# Patient Record
Sex: Female | Born: 1950 | Race: Black or African American | Hispanic: No | Smoking: Former smoker
Health system: Southern US, Community
[De-identification: ages and names within clinical notes are randomized; demographics above are authoritative.]

## PROBLEM LIST (undated history)

## (undated) DIAGNOSIS — C801 Malignant (primary) neoplasm, unspecified: Secondary | ICD-10-CM

## (undated) DIAGNOSIS — Z9289 Personal history of other medical treatment: Secondary | ICD-10-CM

## (undated) DIAGNOSIS — IMO0001 Reserved for inherently not codable concepts without codable children: Secondary | ICD-10-CM

## (undated) DIAGNOSIS — IMO0002 Reserved for concepts with insufficient information to code with codable children: Secondary | ICD-10-CM

## (undated) DIAGNOSIS — R011 Cardiac murmur, unspecified: Secondary | ICD-10-CM

## (undated) DIAGNOSIS — D649 Anemia, unspecified: Secondary | ICD-10-CM

## (undated) HISTORY — DX: Reserved for inherently not codable concepts without codable children: IMO0001

## (undated) HISTORY — DX: Cardiac murmur, unspecified: R01.1

## (undated) HISTORY — DX: Reserved for concepts with insufficient information to code with codable children: IMO0002

---

## 1968-12-28 DIAGNOSIS — Z9289 Personal history of other medical treatment: Secondary | ICD-10-CM

## 1968-12-28 HISTORY — DX: Personal history of other medical treatment: Z92.89

## 1970-04-29 HISTORY — PX: BREAST SURGERY: SHX581

## 1999-06-02 ENCOUNTER — Emergency Department (HOSPITAL_COMMUNITY): Admission: EM | Admit: 1999-06-02 | Discharge: 1999-06-02 | Payer: Self-pay | Admitting: Emergency Medicine

## 1999-06-02 ENCOUNTER — Encounter: Payer: Self-pay | Admitting: Emergency Medicine

## 2000-08-14 ENCOUNTER — Other Ambulatory Visit: Admission: RE | Admit: 2000-08-14 | Discharge: 2000-08-14 | Payer: Self-pay | Admitting: Family Medicine

## 2000-12-05 ENCOUNTER — Inpatient Hospital Stay (HOSPITAL_COMMUNITY): Admission: EM | Admit: 2000-12-05 | Discharge: 2000-12-07 | Payer: Self-pay | Admitting: *Deleted

## 2000-12-06 ENCOUNTER — Encounter: Payer: Self-pay | Admitting: Cardiology

## 2000-12-07 ENCOUNTER — Encounter: Payer: Self-pay | Admitting: Cardiology

## 2000-12-17 ENCOUNTER — Encounter: Payer: Self-pay | Admitting: Cardiovascular Disease

## 2000-12-17 ENCOUNTER — Ambulatory Visit (HOSPITAL_COMMUNITY): Admission: RE | Admit: 2000-12-17 | Discharge: 2000-12-17 | Payer: Self-pay | Admitting: Cardiovascular Disease

## 2002-04-15 ENCOUNTER — Ambulatory Visit (HOSPITAL_COMMUNITY): Admission: RE | Admit: 2002-04-15 | Discharge: 2002-04-15 | Payer: Self-pay | Admitting: Neurosurgery

## 2002-04-15 ENCOUNTER — Encounter: Payer: Self-pay | Admitting: Neurosurgery

## 2003-04-14 ENCOUNTER — Encounter: Admission: RE | Admit: 2003-04-14 | Discharge: 2003-04-14 | Payer: Self-pay | Admitting: Cardiovascular Disease

## 2003-04-20 ENCOUNTER — Other Ambulatory Visit: Admission: RE | Admit: 2003-04-20 | Discharge: 2003-04-20 | Payer: Self-pay | Admitting: Obstetrics and Gynecology

## 2003-09-08 ENCOUNTER — Ambulatory Visit (HOSPITAL_COMMUNITY): Admission: RE | Admit: 2003-09-08 | Discharge: 2003-09-08 | Payer: Self-pay | Admitting: Cardiovascular Disease

## 2004-06-27 ENCOUNTER — Inpatient Hospital Stay (HOSPITAL_COMMUNITY): Admission: AD | Admit: 2004-06-27 | Discharge: 2004-06-28 | Payer: Self-pay | Admitting: Cardiovascular Disease

## 2004-11-13 ENCOUNTER — Other Ambulatory Visit: Admission: RE | Admit: 2004-11-13 | Discharge: 2004-11-13 | Payer: Self-pay | Admitting: Obstetrics and Gynecology

## 2004-12-03 ENCOUNTER — Encounter: Admission: RE | Admit: 2004-12-03 | Discharge: 2004-12-03 | Payer: Self-pay | Admitting: Obstetrics and Gynecology

## 2005-06-14 ENCOUNTER — Observation Stay (HOSPITAL_COMMUNITY): Admission: AD | Admit: 2005-06-14 | Discharge: 2005-06-16 | Payer: Self-pay | Admitting: Cardiovascular Disease

## 2005-07-05 ENCOUNTER — Encounter: Admission: RE | Admit: 2005-07-05 | Discharge: 2005-07-05 | Payer: Self-pay | Admitting: Cardiovascular Disease

## 2005-11-04 ENCOUNTER — Encounter: Admission: RE | Admit: 2005-11-04 | Discharge: 2005-11-04 | Payer: Self-pay | Admitting: Cardiovascular Disease

## 2006-02-17 ENCOUNTER — Encounter: Admission: RE | Admit: 2006-02-17 | Discharge: 2006-02-17 | Payer: Self-pay | Admitting: Obstetrics and Gynecology

## 2007-03-31 ENCOUNTER — Encounter: Admission: RE | Admit: 2007-03-31 | Discharge: 2007-03-31 | Payer: Self-pay | Admitting: Cardiovascular Disease

## 2007-09-30 ENCOUNTER — Emergency Department (HOSPITAL_COMMUNITY): Admission: EM | Admit: 2007-09-30 | Discharge: 2007-09-30 | Payer: Self-pay | Admitting: Emergency Medicine

## 2007-10-01 ENCOUNTER — Encounter: Admission: RE | Admit: 2007-10-01 | Discharge: 2007-10-01 | Payer: Self-pay | Admitting: Cardiovascular Disease

## 2008-05-06 ENCOUNTER — Encounter: Admission: RE | Admit: 2008-05-06 | Discharge: 2008-05-06 | Payer: Self-pay | Admitting: Cardiovascular Disease

## 2008-06-13 ENCOUNTER — Ambulatory Visit (HOSPITAL_COMMUNITY): Admission: RE | Admit: 2008-06-13 | Discharge: 2008-06-13 | Payer: Self-pay | Admitting: Neurosurgery

## 2008-12-18 ENCOUNTER — Observation Stay (HOSPITAL_COMMUNITY): Admission: EM | Admit: 2008-12-18 | Discharge: 2008-12-19 | Payer: Self-pay | Admitting: Emergency Medicine

## 2009-02-01 ENCOUNTER — Encounter: Admission: RE | Admit: 2009-02-01 | Discharge: 2009-02-01 | Payer: Self-pay | Admitting: Cardiovascular Disease

## 2009-03-30 ENCOUNTER — Emergency Department (HOSPITAL_COMMUNITY): Admission: EM | Admit: 2009-03-30 | Discharge: 2009-03-31 | Payer: Self-pay | Admitting: Emergency Medicine

## 2009-07-18 ENCOUNTER — Observation Stay (HOSPITAL_COMMUNITY): Admission: AD | Admit: 2009-07-18 | Discharge: 2009-07-21 | Payer: Self-pay | Admitting: Cardiovascular Disease

## 2009-07-19 ENCOUNTER — Encounter (INDEPENDENT_AMBULATORY_CARE_PROVIDER_SITE_OTHER): Payer: Self-pay | Admitting: Neurology

## 2009-07-19 ENCOUNTER — Encounter (INDEPENDENT_AMBULATORY_CARE_PROVIDER_SITE_OTHER): Payer: Self-pay | Admitting: Cardiovascular Disease

## 2009-07-20 ENCOUNTER — Ambulatory Visit: Payer: Self-pay | Admitting: Thoracic Surgery

## 2009-07-27 ENCOUNTER — Ambulatory Visit (HOSPITAL_COMMUNITY): Admission: RE | Admit: 2009-07-27 | Discharge: 2009-07-27 | Payer: Self-pay | Admitting: Thoracic Surgery

## 2009-07-28 ENCOUNTER — Ambulatory Visit: Payer: Self-pay | Admitting: Thoracic Surgery

## 2009-08-02 ENCOUNTER — Ambulatory Visit (HOSPITAL_COMMUNITY): Admission: RE | Admit: 2009-08-02 | Discharge: 2009-08-02 | Payer: Self-pay | Admitting: Thoracic Surgery

## 2009-08-03 ENCOUNTER — Ambulatory Visit: Payer: Self-pay | Admitting: Internal Medicine

## 2009-08-03 ENCOUNTER — Ambulatory Visit: Payer: Self-pay | Admitting: Thoracic Surgery

## 2009-08-07 LAB — CBC WITH DIFFERENTIAL/PLATELET
EOS%: 3.3 % (ref 0.0–7.0)
LYMPH%: 36.8 % (ref 14.0–49.7)
MCH: 30.1 pg (ref 25.1–34.0)
MCV: 88.5 fL (ref 79.5–101.0)
MONO%: 7.2 % (ref 0.0–14.0)
Platelets: 307 10*3/uL (ref 145–400)
RBC: 4.4 10*6/uL (ref 3.70–5.45)
RDW: 12.1 % (ref 11.2–14.5)

## 2009-08-07 LAB — COMPREHENSIVE METABOLIC PANEL
AST: 19 U/L (ref 0–37)
Albumin: 4.5 g/dL (ref 3.5–5.2)
Alkaline Phosphatase: 118 U/L — ABNORMAL HIGH (ref 39–117)
BUN: 13 mg/dL (ref 6–23)
Potassium: 3.9 mEq/L (ref 3.5–5.3)
Sodium: 142 mEq/L (ref 135–145)
Total Bilirubin: 0.2 mg/dL — ABNORMAL LOW (ref 0.3–1.2)

## 2009-08-17 LAB — CBC WITH DIFFERENTIAL/PLATELET
Basophils Absolute: 0 10*3/uL (ref 0.0–0.1)
EOS%: 1.8 % (ref 0.0–7.0)
Eosinophils Absolute: 0.1 10*3/uL (ref 0.0–0.5)
HGB: 13.2 g/dL (ref 11.6–15.9)
LYMPH%: 56 % — ABNORMAL HIGH (ref 14.0–49.7)
MCH: 30.2 pg (ref 25.1–34.0)
MCV: 88.6 fL (ref 79.5–101.0)
MONO%: 3.4 % (ref 0.0–14.0)
NEUT#: 1.5 10*3/uL (ref 1.5–6.5)
NEUT%: 38.4 % (ref 38.4–76.8)
Platelets: 290 10*3/uL (ref 145–400)
RDW: 12.1 % (ref 11.2–14.5)

## 2009-08-17 LAB — COMPREHENSIVE METABOLIC PANEL
AST: 16 U/L (ref 0–37)
Albumin: 4.1 g/dL (ref 3.5–5.2)
Alkaline Phosphatase: 95 U/L (ref 39–117)
BUN: 19 mg/dL (ref 6–23)
Creatinine, Ser: 1.12 mg/dL (ref 0.40–1.20)
Glucose, Bld: 83 mg/dL (ref 70–99)
Potassium: 4.3 mEq/L (ref 3.5–5.3)
Total Bilirubin: 0.4 mg/dL (ref 0.3–1.2)

## 2009-08-24 LAB — CBC WITH DIFFERENTIAL/PLATELET
Basophils Absolute: 0 10*3/uL (ref 0.0–0.1)
EOS%: 1.9 % (ref 0.0–7.0)
Eosinophils Absolute: 0.1 10*3/uL (ref 0.0–0.5)
HCT: 36.8 % (ref 34.8–46.6)
HGB: 12.7 g/dL (ref 11.6–15.9)
MCH: 30.7 pg (ref 25.1–34.0)
MCV: 88.7 fL (ref 79.5–101.0)
MONO%: 7.4 % (ref 0.0–14.0)
NEUT#: 2.6 10*3/uL (ref 1.5–6.5)
NEUT%: 46.4 % (ref 38.4–76.8)
Platelets: 227 10*3/uL (ref 145–400)
RDW: 12 % (ref 11.2–14.5)

## 2009-08-24 LAB — COMPREHENSIVE METABOLIC PANEL
Albumin: 3.8 g/dL (ref 3.5–5.2)
Alkaline Phosphatase: 111 U/L (ref 39–117)
BUN: 19 mg/dL (ref 6–23)
Calcium: 9.1 mg/dL (ref 8.4–10.5)
Creatinine, Ser: 1.04 mg/dL (ref 0.40–1.20)
Glucose, Bld: 105 mg/dL — ABNORMAL HIGH (ref 70–99)
Potassium: 4.4 mEq/L (ref 3.5–5.3)

## 2009-08-31 LAB — CBC WITH DIFFERENTIAL/PLATELET
Basophils Absolute: 0 10*3/uL (ref 0.0–0.1)
Eosinophils Absolute: 0 10*3/uL (ref 0.0–0.5)
HCT: 39.2 % (ref 34.8–46.6)
HGB: 13.2 g/dL (ref 11.6–15.9)
LYMPH%: 42.4 % (ref 14.0–49.7)
MONO#: 0.4 10*3/uL (ref 0.1–0.9)
NEUT#: 1.9 10*3/uL (ref 1.5–6.5)
Platelets: 395 10*3/uL (ref 145–400)
RBC: 4.4 10*6/uL (ref 3.70–5.45)
WBC: 4.2 10*3/uL (ref 3.9–10.3)

## 2009-08-31 LAB — COMPREHENSIVE METABOLIC PANEL
Albumin: 4.5 g/dL (ref 3.5–5.2)
BUN: 14 mg/dL (ref 6–23)
CO2: 26 mEq/L (ref 19–32)
Glucose, Bld: 109 mg/dL — ABNORMAL HIGH (ref 70–99)
Sodium: 139 mEq/L (ref 135–145)
Total Bilirubin: 0.3 mg/dL (ref 0.3–1.2)
Total Protein: 7.8 g/dL (ref 6.0–8.3)

## 2009-09-06 ENCOUNTER — Ambulatory Visit: Payer: Self-pay | Admitting: Internal Medicine

## 2009-09-07 LAB — COMPREHENSIVE METABOLIC PANEL
Albumin: 3.9 g/dL (ref 3.5–5.2)
CO2: 24 mEq/L (ref 19–32)
Glucose, Bld: 143 mg/dL — ABNORMAL HIGH (ref 70–99)
Potassium: 4.1 mEq/L (ref 3.5–5.3)
Sodium: 136 mEq/L (ref 135–145)
Total Protein: 7.5 g/dL (ref 6.0–8.3)

## 2009-09-07 LAB — CBC WITH DIFFERENTIAL/PLATELET
Eosinophils Absolute: 0 10*3/uL (ref 0.0–0.5)
MONO#: 0.2 10*3/uL (ref 0.1–0.9)
NEUT#: 5.3 10*3/uL (ref 1.5–6.5)
Platelets: 315 10*3/uL (ref 145–400)
RBC: 4.15 10*6/uL (ref 3.70–5.45)
RDW: 12.4 % (ref 11.2–14.5)
WBC: 6.2 10*3/uL (ref 3.9–10.3)

## 2009-09-14 LAB — COMPREHENSIVE METABOLIC PANEL
ALT: 29 U/L (ref 0–35)
Albumin: 3.6 g/dL (ref 3.5–5.2)
Alkaline Phosphatase: 105 U/L (ref 39–117)
Glucose, Bld: 109 mg/dL — ABNORMAL HIGH (ref 70–99)
Potassium: 3.7 mEq/L (ref 3.5–5.3)
Sodium: 141 mEq/L (ref 135–145)
Total Protein: 7.2 g/dL (ref 6.0–8.3)

## 2009-09-14 LAB — CBC WITH DIFFERENTIAL/PLATELET
Eosinophils Absolute: 0 10*3/uL (ref 0.0–0.5)
MCV: 88 fL (ref 79.5–101.0)
MONO#: 0.5 10*3/uL (ref 0.1–0.9)
MONO%: 9.4 % (ref 0.0–14.0)
NEUT#: 1.8 10*3/uL (ref 1.5–6.5)
RBC: 3.78 10*6/uL (ref 3.70–5.45)
RDW: 12.8 % (ref 11.2–14.5)
WBC: 5 10*3/uL (ref 3.9–10.3)

## 2009-09-21 LAB — COMPREHENSIVE METABOLIC PANEL
ALT: 19 U/L (ref 0–35)
AST: 27 U/L (ref 0–37)
Albumin: 4.3 g/dL (ref 3.5–5.2)
Alkaline Phosphatase: 106 U/L (ref 39–117)
Potassium: 3.8 mEq/L (ref 3.5–5.3)
Sodium: 139 mEq/L (ref 135–145)
Total Bilirubin: 0.3 mg/dL (ref 0.3–1.2)
Total Protein: 7.3 g/dL (ref 6.0–8.3)

## 2009-09-21 LAB — CBC WITH DIFFERENTIAL/PLATELET
BASO%: 0.1 % (ref 0.0–2.0)
EOS%: 0.9 % (ref 0.0–7.0)
Eosinophils Absolute: 0 10*3/uL (ref 0.0–0.5)
MCH: 30.8 pg (ref 25.1–34.0)
MCHC: 34.6 g/dL (ref 31.5–36.0)
MCV: 88.9 fL (ref 79.5–101.0)
MONO%: 10.1 % (ref 0.0–14.0)
NEUT#: 1.5 10*3/uL (ref 1.5–6.5)
RBC: 3.93 10*6/uL (ref 3.70–5.45)
RDW: 13.5 % (ref 11.2–14.5)

## 2009-09-28 LAB — CBC WITH DIFFERENTIAL/PLATELET
BASO%: 0.4 % (ref 0.0–2.0)
HCT: 34.5 % — ABNORMAL LOW (ref 34.8–46.6)
LYMPH%: 63.4 % — ABNORMAL HIGH (ref 14.0–49.7)
MCHC: 34.4 g/dL (ref 31.5–36.0)
MCV: 89.6 fL (ref 79.5–101.0)
MONO#: 0.2 10*3/uL (ref 0.1–0.9)
MONO%: 5 % (ref 0.0–14.0)
NEUT%: 30.7 % — ABNORMAL LOW (ref 38.4–76.8)
Platelets: 368 10*3/uL (ref 145–400)
RBC: 3.85 10*6/uL (ref 3.70–5.45)
WBC: 4.1 10*3/uL (ref 3.9–10.3)

## 2009-09-28 LAB — COMPREHENSIVE METABOLIC PANEL
ALT: 12 U/L (ref 0–35)
Alkaline Phosphatase: 96 U/L (ref 39–117)
CO2: 24 mEq/L (ref 19–32)
Creatinine, Ser: 0.89 mg/dL (ref 0.40–1.20)
Glucose, Bld: 125 mg/dL — ABNORMAL HIGH (ref 70–99)
Sodium: 138 mEq/L (ref 135–145)
Total Bilirubin: 0.3 mg/dL (ref 0.3–1.2)

## 2009-10-04 ENCOUNTER — Ambulatory Visit (HOSPITAL_COMMUNITY): Admission: RE | Admit: 2009-10-04 | Discharge: 2009-10-04 | Payer: Self-pay | Admitting: Internal Medicine

## 2009-10-04 LAB — CBC WITH DIFFERENTIAL/PLATELET
Eosinophils Absolute: 0 10*3/uL (ref 0.0–0.5)
HCT: 31.4 % — ABNORMAL LOW (ref 34.8–46.6)
LYMPH%: 44.7 % (ref 14.0–49.7)
MCV: 89.3 fL (ref 79.5–101.0)
MONO#: 0.5 10*3/uL (ref 0.1–0.9)
MONO%: 16.5 % — ABNORMAL HIGH (ref 0.0–14.0)
NEUT#: 1.2 10*3/uL — ABNORMAL LOW (ref 1.5–6.5)
NEUT%: 37.6 % — ABNORMAL LOW (ref 38.4–76.8)
Platelets: 248 10*3/uL (ref 145–400)
WBC: 3.2 10*3/uL — ABNORMAL LOW (ref 3.9–10.3)

## 2009-10-04 LAB — COMPREHENSIVE METABOLIC PANEL
Alkaline Phosphatase: 98 U/L (ref 39–117)
BUN: 13 mg/dL (ref 6–23)
CO2: 24 mEq/L (ref 19–32)
Creatinine, Ser: 0.96 mg/dL (ref 0.40–1.20)
Glucose, Bld: 94 mg/dL (ref 70–99)
Total Bilirubin: 0.2 mg/dL — ABNORMAL LOW (ref 0.3–1.2)
Total Protein: 7 g/dL (ref 6.0–8.3)

## 2009-10-07 ENCOUNTER — Observation Stay (HOSPITAL_COMMUNITY): Admission: AD | Admit: 2009-10-07 | Discharge: 2009-10-09 | Payer: Self-pay | Admitting: Cardiovascular Disease

## 2009-10-10 ENCOUNTER — Ambulatory Visit: Payer: Self-pay | Admitting: Internal Medicine

## 2009-10-12 ENCOUNTER — Ambulatory Visit: Payer: Self-pay | Admitting: Internal Medicine

## 2009-10-12 ENCOUNTER — Ambulatory Visit: Payer: Self-pay | Admitting: Thoracic Surgery

## 2009-10-17 ENCOUNTER — Encounter: Payer: Self-pay | Admitting: Thoracic Surgery

## 2009-10-17 ENCOUNTER — Ambulatory Visit: Payer: Self-pay | Admitting: Thoracic Surgery

## 2009-10-17 ENCOUNTER — Inpatient Hospital Stay (HOSPITAL_COMMUNITY): Admission: RE | Admit: 2009-10-17 | Discharge: 2009-10-25 | Payer: Self-pay | Admitting: Thoracic Surgery

## 2009-10-17 HISTORY — PX: LUNG REMOVAL, PARTIAL: SHX233

## 2009-10-22 ENCOUNTER — Encounter: Payer: Self-pay | Admitting: Internal Medicine

## 2009-11-01 ENCOUNTER — Ambulatory Visit: Payer: Self-pay | Admitting: Thoracic Surgery

## 2009-11-01 ENCOUNTER — Encounter: Admission: RE | Admit: 2009-11-01 | Discharge: 2009-11-01 | Payer: Self-pay | Admitting: Thoracic Surgery

## 2009-11-13 ENCOUNTER — Ambulatory Visit: Payer: Self-pay | Admitting: Internal Medicine

## 2009-11-15 ENCOUNTER — Ambulatory Visit: Payer: Self-pay | Admitting: Thoracic Surgery

## 2009-11-15 ENCOUNTER — Encounter: Admission: RE | Admit: 2009-11-15 | Discharge: 2009-11-15 | Payer: Self-pay | Admitting: Thoracic Surgery

## 2009-11-15 LAB — CBC WITH DIFFERENTIAL/PLATELET
BASO%: 0.2 % (ref 0.0–2.0)
EOS%: 1.6 % (ref 0.0–7.0)
HCT: 34.7 % — ABNORMAL LOW (ref 34.8–46.6)
LYMPH%: 34.6 % (ref 14.0–49.7)
MCH: 30.9 pg (ref 25.1–34.0)
MCHC: 34.2 g/dL (ref 31.5–36.0)
MCV: 90.3 fL (ref 79.5–101.0)
MONO#: 0.4 10*3/uL (ref 0.1–0.9)
MONO%: 7.9 % (ref 0.0–14.0)
NEUT%: 55.7 % (ref 38.4–76.8)
Platelets: 318 10*3/uL (ref 145–400)
RBC: 3.84 10*6/uL (ref 3.70–5.45)
WBC: 4.9 10*3/uL (ref 3.9–10.3)

## 2009-11-15 LAB — COMPREHENSIVE METABOLIC PANEL
ALT: <8 U/L (ref 0–35)
AST: 18 U/L (ref 0–37)
Alkaline Phosphatase: 121 U/L — ABNORMAL HIGH (ref 39–117)
Creatinine, Ser: 0.96 mg/dL (ref 0.40–1.20)
Sodium: 140 mEq/L (ref 135–145)
Total Bilirubin: 0.3 mg/dL (ref 0.3–1.2)
Total Protein: 7.8 g/dL (ref 6.0–8.3)

## 2009-12-20 ENCOUNTER — Encounter: Admission: RE | Admit: 2009-12-20 | Discharge: 2009-12-20 | Payer: Self-pay | Admitting: Cardiovascular Disease

## 2009-12-24 ENCOUNTER — Emergency Department (HOSPITAL_COMMUNITY): Admission: EM | Admit: 2009-12-24 | Discharge: 2009-12-24 | Payer: Self-pay | Admitting: Emergency Medicine

## 2009-12-27 ENCOUNTER — Encounter: Admission: RE | Admit: 2009-12-27 | Discharge: 2009-12-27 | Payer: Self-pay | Admitting: Thoracic Surgery

## 2009-12-27 ENCOUNTER — Ambulatory Visit: Payer: Self-pay | Admitting: Thoracic Surgery

## 2010-02-08 ENCOUNTER — Ambulatory Visit: Payer: Self-pay | Admitting: Internal Medicine

## 2010-02-12 ENCOUNTER — Ambulatory Visit (HOSPITAL_COMMUNITY)
Admission: RE | Admit: 2010-02-12 | Discharge: 2010-02-12 | Payer: Self-pay | Source: Home / Self Care | Admitting: Internal Medicine

## 2010-02-12 LAB — COMPREHENSIVE METABOLIC PANEL
ALT: 10 U/L (ref 0–35)
CO2: 31 mEq/L (ref 19–32)
Calcium: 9.3 mg/dL (ref 8.4–10.5)
Chloride: 107 mEq/L (ref 96–112)
Potassium: 4.1 mEq/L (ref 3.5–5.3)
Sodium: 143 mEq/L (ref 135–145)
Total Protein: 7.6 g/dL (ref 6.0–8.3)

## 2010-02-12 LAB — CBC WITH DIFFERENTIAL/PLATELET
BASO%: 0.7 % (ref 0.0–2.0)
HCT: 40 % (ref 34.8–46.6)
MCHC: 34.3 g/dL (ref 31.5–36.0)
MONO#: 0.4 10*3/uL (ref 0.1–0.9)
RBC: 4.62 10*6/uL (ref 3.70–5.45)
RDW: 13.1 % (ref 11.2–14.5)
WBC: 4.8 10*3/uL (ref 3.9–10.3)
lymph#: 1.8 10*3/uL (ref 0.9–3.3)

## 2010-02-21 ENCOUNTER — Ambulatory Visit (HOSPITAL_COMMUNITY): Admission: RE | Admit: 2010-02-21 | Discharge: 2010-02-21 | Payer: Self-pay | Admitting: Internal Medicine

## 2010-02-28 ENCOUNTER — Ambulatory Visit: Payer: Self-pay | Admitting: Thoracic Surgery

## 2010-03-08 ENCOUNTER — Ambulatory Visit (HOSPITAL_COMMUNITY)
Admission: RE | Admit: 2010-03-08 | Discharge: 2010-03-08 | Payer: Self-pay | Source: Home / Self Care | Admitting: Thoracic Surgery

## 2010-03-08 ENCOUNTER — Ambulatory Visit: Payer: Self-pay | Admitting: Thoracic Surgery

## 2010-03-12 ENCOUNTER — Ambulatory Visit: Payer: Self-pay | Admitting: Internal Medicine

## 2010-03-13 ENCOUNTER — Ambulatory Visit: Payer: Self-pay | Admitting: Thoracic Surgery

## 2010-03-14 ENCOUNTER — Ambulatory Visit
Admission: RE | Admit: 2010-03-14 | Discharge: 2010-05-17 | Payer: Self-pay | Source: Home / Self Care | Attending: Radiation Oncology | Admitting: Radiation Oncology

## 2010-03-14 LAB — CBC WITH DIFFERENTIAL/PLATELET
BASO%: 0.5 % (ref 0.0–2.0)
Eosinophils Absolute: 0.1 10*3/uL (ref 0.0–0.5)
LYMPH%: 35.7 % (ref 14.0–49.7)
MCHC: 34.3 g/dL (ref 31.5–36.0)
MCV: 86.6 fL (ref 79.5–101.0)
MONO%: 6.7 % (ref 0.0–14.0)
Platelets: 306 10*3/uL (ref 145–400)
RBC: 4.47 10*6/uL (ref 3.70–5.45)

## 2010-03-14 LAB — COMPREHENSIVE METABOLIC PANEL
Alkaline Phosphatase: 119 U/L — ABNORMAL HIGH (ref 39–117)
Creatinine, Ser: 1.04 mg/dL (ref 0.40–1.20)
Glucose, Bld: 104 mg/dL — ABNORMAL HIGH (ref 70–99)
Sodium: 142 mEq/L (ref 135–145)
Total Bilirubin: 0.3 mg/dL (ref 0.3–1.2)
Total Protein: 7.4 g/dL (ref 6.0–8.3)

## 2010-03-26 LAB — CBC WITH DIFFERENTIAL/PLATELET
Basophils Absolute: 0 10*3/uL (ref 0.0–0.1)
Eosinophils Absolute: 0.2 10*3/uL (ref 0.0–0.5)
HCT: 38.7 % (ref 34.8–46.6)
LYMPH%: 35.8 % (ref 14.0–49.7)
MCV: 84.5 fL (ref 79.5–101.0)
MONO%: 6.2 % (ref 0.0–14.0)
NEUT#: 3.1 10*3/uL (ref 1.5–6.5)
NEUT%: 54.6 % (ref 38.4–76.8)
Platelets: 302 10*3/uL (ref 145–400)
RBC: 4.58 10*6/uL (ref 3.70–5.45)
nRBC: 0 % (ref 0–0)

## 2010-03-26 LAB — URINALYSIS, MICROSCOPIC - CHCC
Bilirubin (Urine): NEGATIVE
Glucose: NEGATIVE g/dL
Ketones: NEGATIVE mg/dL
Specific Gravity, Urine: 1.02 (ref 1.003–1.035)
WBC, UA: NEGATIVE (ref 0–2)

## 2010-03-27 LAB — COMPREHENSIVE METABOLIC PANEL
BUN: 25 mg/dL — ABNORMAL HIGH (ref 6–23)
CO2: 24 mEq/L (ref 19–32)
Calcium: 9.7 mg/dL (ref 8.4–10.5)
Chloride: 106 mEq/L (ref 96–112)
Creatinine, Ser: 0.98 mg/dL (ref 0.40–1.20)
Glucose, Bld: 109 mg/dL — ABNORMAL HIGH (ref 70–99)
Total Bilirubin: 0.3 mg/dL (ref 0.3–1.2)

## 2010-03-28 LAB — URINE CULTURE

## 2010-04-02 LAB — CBC WITH DIFFERENTIAL/PLATELET
BASO%: 0.2 % (ref 0.0–2.0)
EOS%: 1.8 % (ref 0.0–7.0)
MCH: 29.2 pg (ref 25.1–34.0)
MCHC: 34 g/dL (ref 31.5–36.0)
RBC: 4.15 10*6/uL (ref 3.70–5.45)
RDW: 12.7 % (ref 11.2–14.5)
WBC: 5 10*3/uL (ref 3.9–10.3)
lymph#: 2.2 10*3/uL (ref 0.9–3.3)
nRBC: 0 % (ref 0–0)

## 2010-04-02 LAB — COMPREHENSIVE METABOLIC PANEL
ALT: <8 U/L (ref 0–35)
AST: 21 U/L (ref 0–37)
Albumin: 4.3 g/dL (ref 3.5–5.2)
BUN: 13 mg/dL (ref 6–23)
CO2: 25 mEq/L (ref 19–32)
Calcium: 9.3 mg/dL (ref 8.4–10.5)
Chloride: 105 mEq/L (ref 96–112)
Potassium: 3.8 mEq/L (ref 3.5–5.3)

## 2010-04-09 LAB — CBC WITH DIFFERENTIAL/PLATELET
Basophils Absolute: 0 10*3/uL (ref 0.0–0.1)
EOS%: 1.8 % (ref 0.0–7.0)
HGB: 11.3 g/dL — ABNORMAL LOW (ref 11.6–15.9)
MCH: 29.1 pg (ref 25.1–34.0)
MCV: 86.3 fL (ref 79.5–101.0)
MONO%: 5.3 % (ref 0.0–14.0)
RDW: 12.8 % (ref 11.2–14.5)

## 2010-04-09 LAB — COMPREHENSIVE METABOLIC PANEL
ALT: <8 U/L (ref 0–35)
AST: 18 U/L (ref 0–37)
Albumin: 4 g/dL (ref 3.5–5.2)
Alkaline Phosphatase: 97 U/L (ref 39–117)
BUN: 11 mg/dL (ref 6–23)
Potassium: 4.1 mEq/L (ref 3.5–5.3)
Sodium: 140 mEq/L (ref 135–145)
Total Protein: 6.7 g/dL (ref 6.0–8.3)

## 2010-04-13 ENCOUNTER — Ambulatory Visit: Payer: Self-pay | Admitting: Internal Medicine

## 2010-04-16 LAB — CBC WITH DIFFERENTIAL/PLATELET
Basophils Absolute: 0 10*3/uL (ref 0.0–0.1)
EOS%: 1.2 % (ref 0.0–7.0)
HCT: 34.7 % — ABNORMAL LOW (ref 34.8–46.6)
HGB: 11.8 g/dL (ref 11.6–15.9)
MCH: 29.9 pg (ref 25.1–34.0)
MCV: 88.1 fL (ref 79.5–101.0)
MONO%: 10.8 % (ref 0.0–14.0)
NEUT%: 58 % (ref 38.4–76.8)
Platelets: 263 10*3/uL (ref 145–400)

## 2010-04-16 LAB — COMPREHENSIVE METABOLIC PANEL
AST: 17 U/L (ref 0–37)
Albumin: 4.2 g/dL (ref 3.5–5.2)
Alkaline Phosphatase: 106 U/L (ref 39–117)
BUN: 12 mg/dL (ref 6–23)
Creatinine, Ser: 1.24 mg/dL — ABNORMAL HIGH (ref 0.40–1.20)
Potassium: 4.1 mEq/L (ref 3.5–5.3)

## 2010-05-01 LAB — COMPREHENSIVE METABOLIC PANEL
ALT: <8 U/L (ref 0–35)
AST: 17 U/L (ref 0–37)
Albumin: 3.6 g/dL (ref 3.5–5.2)
Alkaline Phosphatase: 93 U/L (ref 39–117)
BUN: 13 mg/dL (ref 6–23)
CO2: 21 mEq/L (ref 19–32)
Calcium: 8.2 mg/dL — ABNORMAL LOW (ref 8.4–10.5)
Chloride: 106 mEq/L (ref 96–112)
Creatinine, Ser: 0.83 mg/dL (ref 0.40–1.20)
Glucose, Bld: 156 mg/dL — ABNORMAL HIGH (ref 70–99)
Potassium: 3.8 mEq/L (ref 3.5–5.3)
Sodium: 138 mEq/L (ref 135–145)
Total Bilirubin: 0.3 mg/dL (ref 0.3–1.2)
Total Protein: 6.3 g/dL (ref 6.0–8.3)

## 2010-05-01 LAB — CBC WITH DIFFERENTIAL/PLATELET
EOS%: 0.4 % (ref 0.0–7.0)
Eosinophils Absolute: 0 10*3/uL (ref 0.0–0.5)
MCV: 88.5 fL (ref 79.5–101.0)
MONO%: 6.3 % (ref 0.0–14.0)
NEUT#: 1.4 10*3/uL — ABNORMAL LOW (ref 1.5–6.5)
RBC: 3.58 10*6/uL — ABNORMAL LOW (ref 3.70–5.45)
RDW: 13.8 % (ref 11.2–14.5)
nRBC: 0 % (ref 0–0)

## 2010-05-07 LAB — COMPREHENSIVE METABOLIC PANEL
ALT: <8 U/L (ref 0–35)
AST: 17 U/L (ref 0–37)
Albumin: 4.1 g/dL (ref 3.5–5.2)
Alkaline Phosphatase: 96 U/L (ref 39–117)
BUN: 16 mg/dL (ref 6–23)
CO2: 25 mEq/L (ref 19–32)
Calcium: 9.5 mg/dL (ref 8.4–10.5)
Chloride: 103 mEq/L (ref 96–112)
Creatinine, Ser: 0.86 mg/dL (ref 0.40–1.20)
Glucose, Bld: 120 mg/dL — ABNORMAL HIGH (ref 70–99)
Potassium: 4.4 mEq/L (ref 3.5–5.3)
Sodium: 139 mEq/L (ref 135–145)
Total Bilirubin: 0.3 mg/dL (ref 0.3–1.2)
Total Protein: 7 g/dL (ref 6.0–8.3)

## 2010-05-07 LAB — CBC WITH DIFFERENTIAL/PLATELET
BASO%: 0.3 % (ref 0.0–2.0)
Basophils Absolute: 0 10*3/uL (ref 0.0–0.1)
EOS%: 0.4 % (ref 0.0–7.0)
Eosinophils Absolute: 0 10*3/uL (ref 0.0–0.5)
HCT: 33 % — ABNORMAL LOW (ref 34.8–46.6)
HGB: 11.4 g/dL — ABNORMAL LOW (ref 11.6–15.9)
LYMPH%: 27.1 % (ref 14.0–49.7)
MCH: 31.5 pg (ref 25.1–34.0)
MCHC: 34.5 g/dL (ref 31.5–36.0)
MCV: 91.4 fL (ref 79.5–101.0)
MONO#: 0.2 10*3/uL (ref 0.1–0.9)
MONO%: 10.6 % (ref 0.0–14.0)
NEUT#: 1.4 10*3/uL — ABNORMAL LOW (ref 1.5–6.5)
NEUT%: 61.6 % (ref 38.4–76.8)
Platelets: 306 10*3/uL (ref 145–400)
RBC: 3.61 10*6/uL — ABNORMAL LOW (ref 3.70–5.45)
RDW: 15.1 % — ABNORMAL HIGH (ref 11.2–14.5)
WBC: 2.3 10*3/uL — ABNORMAL LOW (ref 3.9–10.3)
lymph#: 0.6 10*3/uL — ABNORMAL LOW (ref 0.9–3.3)

## 2010-05-18 ENCOUNTER — Other Ambulatory Visit: Payer: Self-pay | Admitting: Internal Medicine

## 2010-05-18 DIAGNOSIS — C3490 Malignant neoplasm of unspecified part of unspecified bronchus or lung: Secondary | ICD-10-CM

## 2010-05-20 ENCOUNTER — Encounter: Payer: Self-pay | Admitting: Cardiovascular Disease

## 2010-05-31 ENCOUNTER — Ambulatory Visit: Payer: Self-pay | Admitting: Radiation Oncology

## 2010-05-31 ENCOUNTER — Ambulatory Visit: Payer: PRIVATE HEALTH INSURANCE | Attending: Radiation Oncology | Admitting: Radiation Oncology

## 2010-06-14 ENCOUNTER — Ambulatory Visit (HOSPITAL_COMMUNITY)
Admission: RE | Admit: 2010-06-14 | Discharge: 2010-06-14 | Disposition: A | Discharge status: Home / Self Care | Source: Ambulatory Visit | Attending: Internal Medicine | Admitting: Internal Medicine

## 2010-06-14 ENCOUNTER — Encounter (HOSPITAL_COMMUNITY): Payer: Self-pay

## 2010-06-14 DIAGNOSIS — J438 Other emphysema: Secondary | ICD-10-CM | POA: Insufficient documentation

## 2010-06-14 DIAGNOSIS — Z923 Personal history of irradiation: Secondary | ICD-10-CM | POA: Insufficient documentation

## 2010-06-14 DIAGNOSIS — C3490 Malignant neoplasm of unspecified part of unspecified bronchus or lung: Secondary | ICD-10-CM | POA: Insufficient documentation

## 2010-06-14 DIAGNOSIS — R599 Enlarged lymph nodes, unspecified: Secondary | ICD-10-CM | POA: Insufficient documentation

## 2010-06-14 HISTORY — DX: Malignant (primary) neoplasm, unspecified: C80.1

## 2010-06-14 MED ORDER — IOHEXOL 300 MG/ML  SOLN
80.0000 mL | Freq: Once | INTRAMUSCULAR | Status: AC | PRN
  Administered 2010-06-14: 80 mL via INTRAVENOUS

## 2010-06-15 ENCOUNTER — Ambulatory Visit: Payer: PRIVATE HEALTH INSURANCE | Attending: Radiation Oncology | Admitting: Radiation Oncology

## 2010-06-19 ENCOUNTER — Encounter (HOSPITAL_BASED_OUTPATIENT_CLINIC_OR_DEPARTMENT_OTHER): Payer: PRIVATE HEALTH INSURANCE | Admitting: Internal Medicine

## 2010-06-19 ENCOUNTER — Other Ambulatory Visit: Payer: Self-pay | Admitting: Internal Medicine

## 2010-06-19 DIAGNOSIS — C3430 Malignant neoplasm of lower lobe, unspecified bronchus or lung: Secondary | ICD-10-CM

## 2010-06-19 DIAGNOSIS — Z23 Encounter for immunization: Secondary | ICD-10-CM

## 2010-06-19 DIAGNOSIS — Z5111 Encounter for antineoplastic chemotherapy: Secondary | ICD-10-CM

## 2010-06-19 LAB — COMPREHENSIVE METABOLIC PANEL
ALT: <8 U/L (ref 0–35)
AST: 13 U/L (ref 0–37)
CO2: 24 mEq/L (ref 19–32)
Chloride: 104 mEq/L (ref 96–112)
Sodium: 140 mEq/L (ref 135–145)
Total Bilirubin: 0.3 mg/dL (ref 0.3–1.2)
Total Protein: 6.9 g/dL (ref 6.0–8.3)

## 2010-06-19 LAB — CBC WITH DIFFERENTIAL/PLATELET
BASO%: 0.2 % (ref 0.0–2.0)
EOS%: 2.2 % (ref 0.0–7.0)
LYMPH%: 26.3 % (ref 14.0–49.7)
MCH: 31.6 pg (ref 25.1–34.0)
MCHC: 34.3 g/dL (ref 31.5–36.0)
MONO#: 0.5 10*3/uL (ref 0.1–0.9)
RBC: 3.31 10*6/uL — ABNORMAL LOW (ref 3.70–5.45)
WBC: 3.5 10*3/uL — ABNORMAL LOW (ref 3.9–10.3)
lymph#: 0.9 10*3/uL (ref 0.9–3.3)

## 2010-06-28 ENCOUNTER — Encounter (HOSPITAL_BASED_OUTPATIENT_CLINIC_OR_DEPARTMENT_OTHER): Payer: PRIVATE HEALTH INSURANCE | Admitting: Internal Medicine

## 2010-06-28 ENCOUNTER — Other Ambulatory Visit: Payer: Self-pay | Admitting: Internal Medicine

## 2010-06-28 DIAGNOSIS — Z23 Encounter for immunization: Secondary | ICD-10-CM

## 2010-06-28 DIAGNOSIS — C3430 Malignant neoplasm of lower lobe, unspecified bronchus or lung: Secondary | ICD-10-CM

## 2010-06-28 DIAGNOSIS — Z5111 Encounter for antineoplastic chemotherapy: Secondary | ICD-10-CM

## 2010-06-28 LAB — COMPREHENSIVE METABOLIC PANEL
ALT: <8 U/L (ref 0–35)
Albumin: 3.9 g/dL (ref 3.5–5.2)
Alkaline Phosphatase: 141 U/L — ABNORMAL HIGH (ref 39–117)
Glucose, Bld: 97 mg/dL (ref 70–99)
Potassium: 3.8 mEq/L (ref 3.5–5.3)
Sodium: 140 mEq/L (ref 135–145)
Total Bilirubin: 0.3 mg/dL (ref 0.3–1.2)
Total Protein: 7.9 g/dL (ref 6.0–8.3)

## 2010-06-28 LAB — CBC WITH DIFFERENTIAL/PLATELET
Basophils Absolute: 0 10*3/uL (ref 0.0–0.1)
EOS%: 0 % (ref 0.0–7.0)
Eosinophils Absolute: 0 10*3/uL (ref 0.0–0.5)
HGB: 10.4 g/dL — ABNORMAL LOW (ref 11.6–15.9)
MCH: 30.2 pg (ref 25.1–34.0)
MCV: 89.5 fL (ref 79.5–101.0)
MONO%: 16.5 % — ABNORMAL HIGH (ref 0.0–14.0)
NEUT#: 4.9 10*3/uL (ref 1.5–6.5)
RBC: 3.44 10*6/uL — ABNORMAL LOW (ref 3.70–5.45)
RDW: 13.5 % (ref 11.2–14.5)
lymph#: 1.1 10*3/uL (ref 0.9–3.3)
nRBC: 0 % (ref 0–0)

## 2010-07-11 LAB — TYPE AND SCREEN: Antibody Screen: NEGATIVE

## 2010-07-11 LAB — COMPREHENSIVE METABOLIC PANEL
ALT: 11 U/L (ref 0–35)
Alkaline Phosphatase: 115 U/L (ref 39–117)
CO2: 28 mEq/L (ref 19–32)
Chloride: 104 mEq/L (ref 96–112)
GFR calc non Af Amer: 57 mL/min — ABNORMAL LOW (ref >60)
Glucose, Bld: 97 mg/dL (ref 70–99)
Potassium: 3.8 mEq/L (ref 3.5–5.1)
Sodium: 139 mEq/L (ref 135–145)

## 2010-07-11 LAB — CULTURE, RESPIRATORY W GRAM STAIN

## 2010-07-11 LAB — CBC
MCH: 29.2 pg (ref 26.0–34.0)
MCHC: 34.5 g/dL (ref 30.0–36.0)
MCV: 84.7 fL (ref 78.0–100.0)
Platelets: 302 10*3/uL (ref 150–400)
RDW: 12.8 % (ref 11.5–15.5)

## 2010-07-11 LAB — FUNGUS CULTURE W SMEAR

## 2010-07-11 LAB — SURGICAL PCR SCREEN
MRSA, PCR: NEGATIVE
Staphylococcus aureus: NEGATIVE

## 2010-07-16 LAB — CBC
HCT: 29.4 % — ABNORMAL LOW (ref 36.0–46.0)
HCT: 29.8 % — ABNORMAL LOW (ref 36.0–46.0)
Hemoglobin: 10.1 g/dL — ABNORMAL LOW (ref 12.0–15.0)
Hemoglobin: 10.1 g/dL — ABNORMAL LOW (ref 12.0–15.0)
Hemoglobin: 10.1 g/dL — ABNORMAL LOW (ref 12.0–15.0)
Hemoglobin: 10.2 g/dL — ABNORMAL LOW (ref 12.0–15.0)
Hemoglobin: 11.5 g/dL — ABNORMAL LOW (ref 12.0–15.0)
Hemoglobin: 11.8 g/dL — ABNORMAL LOW (ref 12.0–15.0)
MCH: 32.5 pg (ref 26.0–34.0)
MCHC: 33.9 g/dL (ref 30.0–36.0)
MCHC: 34.6 g/dL (ref 30.0–36.0)
MCHC: 34.8 g/dL (ref 30.0–36.0)
MCV: 91.3 fL (ref 78.0–100.0)
MCV: 92.1 fL (ref 78.0–100.0)
MCV: 93.9 fL (ref 78.0–100.0)
Platelets: 365 10*3/uL (ref 150–400)
Platelets: 410 10*3/uL — ABNORMAL HIGH (ref 150–400)
RBC: 3.17 MIL/uL — ABNORMAL LOW (ref 3.87–5.11)
RBC: 3.18 MIL/uL — ABNORMAL LOW (ref 3.87–5.11)
RBC: 3.66 MIL/uL — ABNORMAL LOW (ref 3.87–5.11)
RBC: 3.72 MIL/uL — ABNORMAL LOW (ref 3.87–5.11)
RDW: 16.5 % — ABNORMAL HIGH (ref 11.5–15.5)
RDW: 17 % — ABNORMAL HIGH (ref 11.5–15.5)
WBC: 4.2 10*3/uL (ref 4.0–10.5)
WBC: 6.4 10*3/uL (ref 4.0–10.5)
WBC: 8.2 10*3/uL (ref 4.0–10.5)

## 2010-07-16 LAB — COMPREHENSIVE METABOLIC PANEL
ALT: 11 U/L (ref 0–35)
ALT: 25 U/L (ref 0–35)
AST: 32 U/L (ref 0–37)
Albumin: 3.8 g/dL (ref 3.5–5.2)
Alkaline Phosphatase: 77 U/L (ref 39–117)
BUN: 12 mg/dL (ref 6–23)
BUN: 2 mg/dL — ABNORMAL LOW (ref 6–23)
CO2: 22 mEq/L (ref 19–32)
CO2: 26 mEq/L (ref 19–32)
Chloride: 107 mEq/L (ref 96–112)
Chloride: 110 mEq/L (ref 96–112)
Chloride: 99 mEq/L (ref 96–112)
Creatinine, Ser: 0.89 mg/dL (ref 0.4–1.2)
Creatinine, Ser: 1.28 mg/dL — ABNORMAL HIGH (ref 0.4–1.2)
GFR calc Af Amer: 51 mL/min — ABNORMAL LOW (ref >60)
GFR calc non Af Amer: 42 mL/min — ABNORMAL LOW (ref >60)
Glucose, Bld: 116 mg/dL — ABNORMAL HIGH (ref 70–99)
Glucose, Bld: 116 mg/dL — ABNORMAL HIGH (ref 70–99)
Potassium: 3.8 mEq/L (ref 3.5–5.1)
Sodium: 133 mEq/L — ABNORMAL LOW (ref 135–145)
Sodium: 139 mEq/L (ref 135–145)
Total Bilirubin: 0.2 mg/dL — ABNORMAL LOW (ref 0.3–1.2)
Total Bilirubin: 0.3 mg/dL (ref 0.3–1.2)
Total Protein: 7.1 g/dL (ref 6.0–8.3)

## 2010-07-16 LAB — TYPE AND SCREEN: Antibody Screen: NEGATIVE

## 2010-07-16 LAB — DIFFERENTIAL
Basophils Absolute: 0 10*3/uL (ref 0.0–0.1)
Eosinophils Absolute: 0.1 10*3/uL (ref 0.0–0.7)
Eosinophils Relative: 1 % (ref 0–5)
Neutrophils Relative %: 63 % (ref 43–77)

## 2010-07-16 LAB — BLOOD GAS, ARTERIAL
Acid-base deficit: 0.3 mmol/L (ref 0.0–2.0)
Bicarbonate: 23.7 mEq/L (ref 20.0–24.0)
O2 Saturation: 97.4 %
pO2, Arterial: 75.3 mmHg — ABNORMAL LOW (ref 80.0–100.0)

## 2010-07-16 LAB — URINALYSIS, ROUTINE W REFLEX MICROSCOPIC
Bilirubin Urine: NEGATIVE
Glucose, UA: NEGATIVE mg/dL
Ketones, ur: NEGATIVE mg/dL
Leukocytes, UA: NEGATIVE
Nitrite: NEGATIVE
Specific Gravity, Urine: 1.022 (ref 1.005–1.030)
Urobilinogen, UA: 0.2 mg/dL (ref 0.0–1.0)
pH: 5 (ref 5.0–8.0)

## 2010-07-16 LAB — BASIC METABOLIC PANEL
CO2: 27 mEq/L (ref 19–32)
CO2: 30 mEq/L (ref 19–32)
Calcium: 8.2 mg/dL — ABNORMAL LOW (ref 8.4–10.5)
Calcium: 8.6 mg/dL (ref 8.4–10.5)
Calcium: 8.7 mg/dL (ref 8.4–10.5)
Calcium: 8.7 mg/dL (ref 8.4–10.5)
Creatinine, Ser: 0.87 mg/dL (ref 0.4–1.2)
Creatinine, Ser: 0.91 mg/dL (ref 0.4–1.2)
Creatinine, Ser: 0.95 mg/dL (ref 0.4–1.2)
Creatinine, Ser: 0.99 mg/dL (ref 0.4–1.2)
GFR calc Af Amer: >60 mL/min (ref >60)
GFR calc Af Amer: >60 mL/min (ref >60)
GFR calc non Af Amer: 57 mL/min — ABNORMAL LOW (ref >60)
GFR calc non Af Amer: >60 mL/min (ref >60)
Glucose, Bld: 118 mg/dL — ABNORMAL HIGH (ref 70–99)
Glucose, Bld: 96 mg/dL (ref 70–99)
Glucose, Bld: 98 mg/dL (ref 70–99)
Sodium: 136 mEq/L (ref 135–145)
Sodium: 137 mEq/L (ref 135–145)
Sodium: 139 mEq/L (ref 135–145)
Sodium: 144 mEq/L (ref 135–145)

## 2010-07-16 LAB — APTT: aPTT: 26 seconds (ref 24–37)

## 2010-07-16 LAB — CULTURE, BLOOD (ROUTINE X 2)

## 2010-07-16 LAB — URINE MICROSCOPIC-ADD ON

## 2010-07-16 LAB — POCT I-STAT 3, ART BLOOD GAS (G3+)
pCO2 arterial: 45.8 mmHg — ABNORMAL HIGH (ref 35.0–45.0)
pH, Arterial: 7.36 (ref 7.350–7.400)
pO2, Arterial: 64 mmHg — ABNORMAL LOW (ref 80.0–100.0)

## 2010-07-16 LAB — URINE CULTURE
Colony Count: 30000
Special Requests: NEGATIVE

## 2010-07-16 LAB — PROTIME-INR: INR: 0.98 (ref 0.00–1.49)

## 2010-07-16 LAB — ABO/RH: ABO/RH(D): B POS

## 2010-07-18 LAB — COMPREHENSIVE METABOLIC PANEL
ALT: 10 U/L (ref 0–35)
Calcium: 9.2 mg/dL (ref 8.4–10.5)
Glucose, Bld: 95 mg/dL (ref 70–99)
Sodium: 138 mEq/L (ref 135–145)
Total Protein: 7.2 g/dL (ref 6.0–8.3)

## 2010-07-18 LAB — CBC
Hemoglobin: 13.8 g/dL (ref 12.0–15.0)
MCHC: 34.1 g/dL (ref 30.0–36.0)
RDW: 12 % (ref 11.5–15.5)

## 2010-07-18 LAB — APTT: aPTT: 27 seconds (ref 24–37)

## 2010-07-18 LAB — AFB CULTURE WITH SMEAR (NOT AT ARMC)

## 2010-07-18 LAB — CULTURE, RESPIRATORY W GRAM STAIN

## 2010-07-18 LAB — PROTIME-INR
INR: 1.03 (ref 0.00–1.49)
Prothrombin Time: 13.4 seconds (ref 11.6–15.2)

## 2010-07-18 LAB — FUNGUS CULTURE W SMEAR

## 2010-07-19 ENCOUNTER — Other Ambulatory Visit: Payer: Self-pay | Admitting: Internal Medicine

## 2010-07-19 ENCOUNTER — Encounter (HOSPITAL_BASED_OUTPATIENT_CLINIC_OR_DEPARTMENT_OTHER): Payer: PRIVATE HEALTH INSURANCE | Admitting: Internal Medicine

## 2010-07-19 DIAGNOSIS — C3430 Malignant neoplasm of lower lobe, unspecified bronchus or lung: Secondary | ICD-10-CM

## 2010-07-19 DIAGNOSIS — Z5111 Encounter for antineoplastic chemotherapy: Secondary | ICD-10-CM

## 2010-07-19 DIAGNOSIS — Z23 Encounter for immunization: Secondary | ICD-10-CM

## 2010-07-19 LAB — CBC WITH DIFFERENTIAL/PLATELET
BASO%: 0 % (ref 0.0–2.0)
LYMPH%: 10.6 % — ABNORMAL LOW (ref 14.0–49.7)
MCHC: 33.9 g/dL (ref 31.5–36.0)
MCV: 90.4 fL (ref 79.5–101.0)
MONO%: 12.4 % (ref 0.0–14.0)
Platelets: 232 10*3/uL (ref 145–400)
RBC: 3.33 10*6/uL — ABNORMAL LOW (ref 3.70–5.45)
nRBC: 0 % (ref 0–0)

## 2010-07-19 LAB — COMPREHENSIVE METABOLIC PANEL
ALT: 15 U/L (ref 0–35)
AST: 19 U/L (ref 0–37)
BUN: 17 mg/dL (ref 6–23)
CO2: 22 mEq/L (ref 19–32)
Creatinine, Ser: 0.89 mg/dL (ref 0.40–1.20)
Total Bilirubin: 0.2 mg/dL — ABNORMAL LOW (ref 0.3–1.2)

## 2010-07-23 LAB — COMPREHENSIVE METABOLIC PANEL
ALT: 10 U/L (ref 0–35)
AST: 20 U/L (ref 0–37)
Albumin: 3.8 g/dL (ref 3.5–5.2)
Alkaline Phosphatase: 126 U/L — ABNORMAL HIGH (ref 39–117)
BUN: 11 mg/dL (ref 6–23)
Chloride: 104 mEq/L (ref 96–112)
GFR calc Af Amer: >60 mL/min (ref >60)
Potassium: 3.2 mEq/L — ABNORMAL LOW (ref 3.5–5.1)
Sodium: 140 mEq/L (ref 135–145)
Total Bilirubin: 0.3 mg/dL (ref 0.3–1.2)
Total Protein: 7.9 g/dL (ref 6.0–8.3)

## 2010-07-23 LAB — BASIC METABOLIC PANEL
BUN: 10 mg/dL (ref 6–23)
Chloride: 107 mEq/L (ref 96–112)
Creatinine, Ser: 1 mg/dL (ref 0.4–1.2)
GFR calc non Af Amer: 56 mL/min — ABNORMAL LOW (ref >60)
Glucose, Bld: 103 mg/dL — ABNORMAL HIGH (ref 70–99)

## 2010-07-23 LAB — DIFFERENTIAL
Basophils Absolute: 0 K/uL (ref 0.0–0.1)
Basophils Relative: 0 % (ref 0–1)
Eosinophils Absolute: 0.1 K/uL (ref 0.0–0.7)
Eosinophils Relative: 2 % (ref 0–5)
Lymphocytes Relative: 50 % — ABNORMAL HIGH (ref 12–46)
Lymphs Abs: 2.8 K/uL (ref 0.7–4.0)
Monocytes Absolute: 0.4 K/uL (ref 0.1–1.0)
Monocytes Relative: 7 % (ref 3–12)
Neutro Abs: 2.3 K/uL (ref 1.7–7.7)
Neutrophils Relative %: 40 % — ABNORMAL LOW (ref 43–77)

## 2010-07-23 LAB — CBC
HCT: 38.2 % (ref 36.0–46.0)
Hemoglobin: 12.9 g/dL (ref 12.0–15.0)
MCHC: 33.9 g/dL (ref 30.0–36.0)
MCV: 89.3 fL (ref 78.0–100.0)
Platelets: 282 K/uL (ref 150–400)
RBC: 4.27 MIL/uL (ref 3.87–5.11)
RDW: 12.3 % (ref 11.5–15.5)
WBC: 5.7 K/uL (ref 4.0–10.5)

## 2010-07-23 LAB — URINE MICROSCOPIC-ADD ON

## 2010-07-23 LAB — URINALYSIS, ROUTINE W REFLEX MICROSCOPIC
Bilirubin Urine: NEGATIVE
Nitrite: NEGATIVE
Specific Gravity, Urine: 1.006 (ref 1.005–1.030)
pH: 6 (ref 5.0–8.0)

## 2010-07-23 LAB — LIPID PANEL
Triglycerides: 80 mg/dL (ref <150)
VLDL: 16 mg/dL (ref 0–40)

## 2010-07-23 LAB — GLUCOSE, CAPILLARY: Glucose-Capillary: 106 mg/dL — ABNORMAL HIGH (ref 70–99)

## 2010-07-25 ENCOUNTER — Encounter (HOSPITAL_BASED_OUTPATIENT_CLINIC_OR_DEPARTMENT_OTHER): Admitting: Internal Medicine

## 2010-07-25 ENCOUNTER — Other Ambulatory Visit: Payer: Self-pay | Admitting: Internal Medicine

## 2010-07-25 DIAGNOSIS — Z23 Encounter for immunization: Secondary | ICD-10-CM

## 2010-07-25 DIAGNOSIS — C3430 Malignant neoplasm of lower lobe, unspecified bronchus or lung: Secondary | ICD-10-CM

## 2010-07-25 DIAGNOSIS — Z5111 Encounter for antineoplastic chemotherapy: Secondary | ICD-10-CM

## 2010-07-25 LAB — COMPREHENSIVE METABOLIC PANEL
ALT: 9 U/L (ref 0–35)
AST: 19 U/L (ref 0–37)
Alkaline Phosphatase: 120 U/L — ABNORMAL HIGH (ref 39–117)
CO2: 25 mEq/L (ref 19–32)
Creatinine, Ser: 1.13 mg/dL (ref 0.40–1.20)
Sodium: 140 mEq/L (ref 135–145)
Total Bilirubin: 0.5 mg/dL (ref 0.3–1.2)
Total Protein: 6.7 g/dL (ref 6.0–8.3)

## 2010-07-25 LAB — CBC WITH DIFFERENTIAL/PLATELET
BASO%: 0 % (ref 0.0–2.0)
EOS%: 1.3 % (ref 0.0–7.0)
LYMPH%: 41.3 % (ref 14.0–49.7)
MCH: 31 pg (ref 25.1–34.0)
MCHC: 34.6 g/dL (ref 31.5–36.0)
MONO#: 0.1 10*3/uL (ref 0.1–0.9)
NEUT%: 53.5 % (ref 38.4–76.8)
Platelets: 224 10*3/uL (ref 145–400)
RBC: 3.23 10*6/uL — ABNORMAL LOW (ref 3.70–5.45)
WBC: 1.6 10*3/uL — ABNORMAL LOW (ref 3.9–10.3)

## 2010-08-04 LAB — CARDIAC PANEL(CRET KIN+CKTOT+MB+TROPI)
CK, MB: 1.8 ng/mL (ref 0.3–4.0)
Total CK: 140 U/L (ref 7–177)
Total CK: 149 U/L (ref 7–177)
Troponin I: 0.01 ng/mL (ref 0.00–0.06)
Troponin I: 0.02 ng/mL (ref 0.00–0.06)

## 2010-08-04 LAB — URINALYSIS, ROUTINE W REFLEX MICROSCOPIC
Bilirubin Urine: NEGATIVE
Glucose, UA: NEGATIVE mg/dL
Protein, ur: NEGATIVE mg/dL

## 2010-08-04 LAB — DIFFERENTIAL
Basophils Absolute: 0 10*3/uL (ref 0.0–0.1)
Basophils Relative: 0 % (ref 0–1)
Eosinophils Absolute: 0.1 10*3/uL (ref 0.0–0.7)
Monocytes Relative: 6 % (ref 3–12)
Neutro Abs: 2.7 10*3/uL (ref 1.7–7.7)
Neutrophils Relative %: 51 % (ref 43–77)

## 2010-08-04 LAB — BASIC METABOLIC PANEL
BUN: 13 mg/dL (ref 6–23)
CO2: 26 mEq/L (ref 19–32)
Calcium: 8.8 mg/dL (ref 8.4–10.5)
Chloride: 106 mEq/L (ref 96–112)
Creatinine, Ser: 1.11 mg/dL (ref 0.4–1.2)
GFR calc Af Amer: >60 mL/min (ref >60)
Glucose, Bld: 88 mg/dL (ref 70–99)

## 2010-08-04 LAB — CK TOTAL AND CKMB (NOT AT ARMC)
CK, MB: 1.8 ng/mL (ref 0.3–4.0)
Relative Index: 1.1 (ref 0.0–2.5)

## 2010-08-04 LAB — PROTIME-INR: INR: 1 (ref 0.00–1.49)

## 2010-08-04 LAB — URINE MICROSCOPIC-ADD ON

## 2010-08-04 LAB — CBC
MCHC: 33.9 g/dL (ref 30.0–36.0)
MCV: 89 fL (ref 78.0–100.0)
Platelets: 303 10*3/uL (ref 150–400)
RBC: 4.2 MIL/uL (ref 3.87–5.11)
RDW: 12.2 % (ref 11.5–15.5)

## 2010-08-04 LAB — TROPONIN I: Troponin I: 0.01 ng/mL (ref 0.00–0.06)

## 2010-08-04 LAB — LIPID PANEL
LDL Cholesterol: 116 mg/dL — ABNORMAL HIGH (ref 0–99)
Total CHOL/HDL Ratio: 3.2 RATIO
VLDL: 18 mg/dL (ref 0–40)

## 2010-08-04 LAB — URINE CULTURE: Colony Count: 50000

## 2010-08-04 LAB — POCT CARDIAC MARKERS
CKMB, poc: 1.1 ng/mL (ref 1.0–8.0)
CKMB, poc: <1 ng/mL — ABNORMAL LOW (ref 1.0–8.0)
Myoglobin, poc: 73 ng/mL (ref 12–200)
Troponin i, poc: <0.05 ng/mL (ref 0.00–0.09)

## 2010-08-04 LAB — COMPREHENSIVE METABOLIC PANEL
Albumin: 3.6 g/dL (ref 3.5–5.2)
BUN: 12 mg/dL (ref 6–23)
Calcium: 8.9 mg/dL (ref 8.4–10.5)
Creatinine, Ser: 1.04 mg/dL (ref 0.4–1.2)
GFR calc Af Amer: >60 mL/min (ref >60)
Total Protein: 7.3 g/dL (ref 6.0–8.3)

## 2010-08-04 LAB — APTT: aPTT: 27 seconds (ref 24–37)

## 2010-08-13 ENCOUNTER — Other Ambulatory Visit: Payer: Self-pay | Admitting: Internal Medicine

## 2010-08-13 ENCOUNTER — Encounter (HOSPITAL_BASED_OUTPATIENT_CLINIC_OR_DEPARTMENT_OTHER): Payer: PRIVATE HEALTH INSURANCE | Admitting: Internal Medicine

## 2010-08-13 DIAGNOSIS — C3490 Malignant neoplasm of unspecified part of unspecified bronchus or lung: Secondary | ICD-10-CM

## 2010-08-13 DIAGNOSIS — Z23 Encounter for immunization: Secondary | ICD-10-CM

## 2010-08-13 DIAGNOSIS — C3430 Malignant neoplasm of lower lobe, unspecified bronchus or lung: Secondary | ICD-10-CM

## 2010-08-13 DIAGNOSIS — Z5111 Encounter for antineoplastic chemotherapy: Secondary | ICD-10-CM

## 2010-08-13 LAB — COMPREHENSIVE METABOLIC PANEL
AST: 27 U/L (ref 0–37)
Alkaline Phosphatase: 124 U/L — ABNORMAL HIGH (ref 39–117)
BUN: 13 mg/dL (ref 6–23)
Creatinine, Ser: 0.84 mg/dL (ref 0.40–1.20)
Potassium: 4.3 mEq/L (ref 3.5–5.3)

## 2010-08-13 LAB — CBC WITH DIFFERENTIAL/PLATELET
Basophils Absolute: 0 10*3/uL (ref 0.0–0.1)
EOS%: 1.3 % (ref 0.0–7.0)
HGB: 10.3 g/dL — ABNORMAL LOW (ref 11.6–15.9)
MCH: 30.7 pg (ref 25.1–34.0)
MCHC: 33.2 g/dL (ref 31.5–36.0)
MCV: 92.5 fL (ref 79.5–101.0)
MONO%: 11.8 % (ref 0.0–14.0)
RDW: 16.9 % — ABNORMAL HIGH (ref 11.2–14.5)

## 2010-08-31 ENCOUNTER — Ambulatory Visit (HOSPITAL_COMMUNITY)
Admission: RE | Admit: 2010-08-31 | Discharge: 2010-08-31 | Disposition: A | Discharge status: Home / Self Care | Source: Ambulatory Visit | Attending: Internal Medicine | Admitting: Internal Medicine

## 2010-08-31 ENCOUNTER — Encounter (HOSPITAL_COMMUNITY): Payer: Self-pay

## 2010-08-31 DIAGNOSIS — C3490 Malignant neoplasm of unspecified part of unspecified bronchus or lung: Secondary | ICD-10-CM | POA: Insufficient documentation

## 2010-08-31 DIAGNOSIS — I2510 Atherosclerotic heart disease of native coronary artery without angina pectoris: Secondary | ICD-10-CM | POA: Insufficient documentation

## 2010-08-31 DIAGNOSIS — R609 Edema, unspecified: Secondary | ICD-10-CM | POA: Insufficient documentation

## 2010-08-31 MED ORDER — IOHEXOL 300 MG/ML  SOLN
80.0000 mL | Freq: Once | INTRAMUSCULAR | Status: AC | PRN
  Administered 2010-08-31: 80 mL via INTRAVENOUS

## 2010-09-03 ENCOUNTER — Other Ambulatory Visit: Payer: Self-pay | Admitting: Internal Medicine

## 2010-09-03 ENCOUNTER — Encounter (HOSPITAL_BASED_OUTPATIENT_CLINIC_OR_DEPARTMENT_OTHER): Payer: PRIVATE HEALTH INSURANCE | Admitting: Internal Medicine

## 2010-09-03 DIAGNOSIS — C3430 Malignant neoplasm of lower lobe, unspecified bronchus or lung: Secondary | ICD-10-CM

## 2010-09-03 DIAGNOSIS — C3490 Malignant neoplasm of unspecified part of unspecified bronchus or lung: Secondary | ICD-10-CM

## 2010-09-03 LAB — COMPREHENSIVE METABOLIC PANEL
Albumin: 4.1 g/dL (ref 3.5–5.2)
Alkaline Phosphatase: 129 U/L — ABNORMAL HIGH (ref 39–117)
BUN: 11 mg/dL (ref 6–23)
Glucose, Bld: 78 mg/dL (ref 70–99)
Total Bilirubin: 0.2 mg/dL — ABNORMAL LOW (ref 0.3–1.2)

## 2010-09-03 LAB — CBC WITH DIFFERENTIAL/PLATELET
Basophils Absolute: 0 10*3/uL (ref 0.0–0.1)
EOS%: 1.6 % (ref 0.0–7.0)
HCT: 31.8 % — ABNORMAL LOW (ref 34.8–46.6)
HGB: 10.7 g/dL — ABNORMAL LOW (ref 11.6–15.9)
MCH: 31.5 pg (ref 25.1–34.0)
MCV: 93.5 fL (ref 79.5–101.0)
MONO%: 14.6 % — ABNORMAL HIGH (ref 0.0–14.0)
NEUT%: 46.6 % (ref 38.4–76.8)

## 2010-09-11 NOTE — Letter (Signed)
March 13, 2010   Lajuana Matte, MD  [phone] [address]  [place], [place] [address]   Re:  ESTLE, SABELLA                DOB:  1950-12-16   Dear Arbutus Ped:   I saw the patient back today and unfortunately, an endobronchial  ultrasound almost all the nodes were positive both the 7 and the 4 nodes  were positive, as well as the 4L lymph nodes as well as the 7 nodes.  I  informed her of the findings, and we will see her back again as  scheduled.  Her blood pressure is 141/76, pulse 76, respirations 18, and  sats were 94%.   Ines Bloomer, M.D.  Electronically Signed   DPB/MEDQ  D:  03/13/2010  T:  03/14/2010  Job:  [id number]

## 2010-09-11 NOTE — Letter (Signed)
November 15, 2009   Lajuana Matte, MD  [phone] [address]  [place], [place] [address]   Re:  CRISTAL, QADIR                DOB:  January 07, 1951   Dear Dr. Arbutus Ped:   The patient returns today and she is stable.  Overall, she is doing  well.  Her incisions are well healed.  Her blood pressure is 127/73,  pulse 96, respirations 18, saturations were 93%.  I told her to  gradually increase her activities and I will see her back again in 6  weeks with another chest x-ray.  She will get CT scan in 3 months.   Ines Bloomer, M.D.  Electronically Signed   DPB/MEDQ  D:  11/15/2009  T:  11/16/2009  Job:  [id number]   cc:   [name], M.D.

## 2010-09-11 NOTE — Letter (Signed)
December 27, 2009   Lajuana Matte, MD  [phone]  [place], [place] [address]   Re:  Kaitlin Reed, Kaitlin Reed                DOB:  16-Apr-1951   Dear Arbutus Ped,   I saw the patient back today.  Her blood pressure is 140/76, pulse 77,  respirations 18, saturations were 95%.  We have discontinued her oxygen.  She is doing well.  Her chest x-ray showed normal postoperative changes.  We plan to see her back again in October when she gets her first CT  scan.   I appreciate the opportunity of seeing the patient.   Ines Bloomer, M.D.  Electronically Signed   DPB/MEDQ  D:  12/27/2009  T:  12/28/2009  Job:  [id number]

## 2010-09-11 NOTE — Letter (Signed)
July 28, 2009   [name], MD  [address] [place] [address]   Re:  Kaitlin Reed, Kaitlin Reed                DOB:  1951/02/07   Dear Dr. Algie Coffer:   I saw the patient back today and I discussed with she and her family the  findings of her lung function tests and PET scan.  Her lung function  test was satisfactory, however a PET scan is positive in the left lower  lobe lesion as well as AP window and the left hilar adenopathy.  I have  recommended that she get a bronchoscopy with endobronchial ultrasound.  If that is negative, then she will probably need a needle biopsy.  I  feel that she probably has a stage IIIA non-small cell lung cancer.  If  that is the case, we will refer her for appropriate chemotherapy prior  to considering any surgery.   Ines Bloomer, M.D.  Electronically Signed   DPB/MEDQ  D:  07/28/2009  T:  07/29/2009  Job:  [id number]

## 2010-09-11 NOTE — Letter (Signed)
February 28, 2010   Lajuana Matte, MD  [phone] [address]  [place], [place] [address]   Re:  JMYA, ULIANO                DOB:  [birth year]   Dear Arbutus Ped:   I saw the patient back in the office today.  Her blood pressure 134/78,  pulse 34, respirations 18, sats 94%.  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  I have  scheduled for fiberoptic bronchoscopy and endobronchial ultrasound and  possible mediastinoscopy on March 08, 2010, at [facility].  I  appreciate the opportunity of seeing the patient and it is unfortunate  that she has since an early recurrence.   Ines Bloomer, M.D.  Electronically Signed   DPB/MEDQ  D:  02/28/2010  T:  03/01/2010  Job:  [id number]

## 2010-09-11 NOTE — Letter (Signed)
October 12, 2009   Velora Heckler. Arbutus Ped, MD  [address]  [place], [place] [address]   Re:  Kaitlin Reed, Kaitlin Reed                DOB:  Feb 27, 1951   Dear Shirline Frees,   I saw the patient in the office today and discussed with her after she  has received 3 cycles of chemotherapy with a marked response. As we  discussed in cancer counsel this morning was to go ahead and proceed  with resection and we are tentatively scheduled her for December 21 at  [facility].  We will do a left VATS left lower lobectomy with node  dissection and her blood pressure was 134/79, pulse 100, respirations  18, sats were 94%.  Lungs were clear to auscultation and percussion.   I appreciate the opportunity of seeing the patient.   Sincerely,   Ines Bloomer, M.D.  Electronically Signed   DPB/MEDQ  D:  10/12/2009  T:  10/13/2009  Job:  [id number]

## 2010-09-11 NOTE — Letter (Signed)
August 03, 2009   [name], MD  [address], [place] [address]   Re:  Kaitlin Reed, Kaitlin Reed                DOB:  [birth year]   Dear Dr. Algie Coffer:   I saw the patient back today, and per my discussion she has a stage IIA  or IIIA non-small cell lung cancer which we obtained with endobronchial  ultrasound.  I informed her and her family and we will refer her to Dr.  Si Gaul to initiate probable chemotherapy.  There is a  possibility that we can go ahead and do surgery on her as she has a good  response to chemotherapy.  We will see her back again after her  chemotherapy.  Her blood pressure was 160/80, pulse 72, respirations 18,  sats were 93%.  We appreciate the opportunity of seeing the patient.   Sincerely,   Ines Bloomer, M.D.  Electronically Signed   DPB/MEDQ  D:  08/03/2009  T:  08/04/2009  Job:  [id number]   cc:   Lajuana Matte, MD

## 2010-09-11 NOTE — Letter (Signed)
November 01, 2009   Mohamed K. Arbutus Ped, MD  [address]  [place], [place] [address]   Re:  Kaitlin Reed, Kaitlin Reed                DOB:  March 12, 1951   Dear Arbutus Ped,   The patient returns today.  She is status post left lower lobectomy with  node dissection.  Unfortunately, she is still a stage IIA after her  resection.  She had problems with oxygen afterwards and had to be sent  home on home O2 with her sats on room air at 93%.  We told her to  gradually increase her activities.  She can start driving.  She is  having minimal pain.  Plan to see her back again in 2 weeks with another  chest x-ray, remove the chest tube stitches.   Ines Bloomer, M.D.  Electronically Signed   DPB/MEDQ  D:  11/01/2009  T:  11/02/2009  Job:  [id number]

## 2010-09-14 NOTE — H&P (Signed)
NAMEABBYGALE, LAPID                ACCOUNT NO.:  [id number]   MEDICAL RECORD NO.:  [id number]          PATIENT TYPE:  INP   LOCATION:  3701                         FACILITY:  [facility]   PHYSICIAN:  [name], M.D.  DATE OF BIRTH:  11/29/1950   DATE OF ADMISSION:  06/27/2004  DATE OF DISCHARGE:                                HISTORY & PHYSICAL   CHIEF COMPLAINT:  Chest pain.   HISTORY OF PRESENT ILLNESS:  This 60 year old black female complains of left-  sided chest pain along with left arm pain with recent abnormal cardiac  stress test.  The patient denies any sweating spells and shortness of  breath.  She has a history of cervical radiculopathy.   PAST MEDICAL HISTORY:  Negative for diabetes, hypertension, elevated  cholesterol level, myocardial infarction, obesity, and exercise.  Positive  for smoking half a pack of cigarettes per day for 20+ years.  No history of  alcohol intake.   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   CURRENT MEDICATIONS:  1.  Aspirin one daily.  2.  Xanax 0.25 mg one twice daily as needed.   ALLERGIES:  No known drug allergies.   PERSONAL HISTORY:  The patient is married x2.  Husband is 59 years old.  She  has two sons from first marriage.   FAMILY HISTORY:  Has four brothers all living and well and eight sisters all  living and well.   REVIEW OF SYSTEMS:  The patient denies any weight gain, weight loss, hearing  loss, cough, hemoptysis, asthma, COPD, dizziness, nausea, vomiting,  diarrhea, constipation, GI bleed, hepatitis, blood transfusion, kidney  stones, strokes, seizures, and psychiatric admissions.  Positive for vision  change with wearing glasses, plus partial upper dentures.  Positive for  chest pain.  Positive for joint pains.   PHYSICAL EXAMINATION:  VITAL SIGNS:  Pulse 80, respirations 14, blood  pressure 118/70, height 5 feet 6 inches, weight 160 pounds.  GENERAL:   The patient is alert and oriented x3.  HEENT:  Head normocephalic and atraumatic.  Eyes brown with pupils equal and  reactive to light.  Ears, nose, and throat reveal mucous membranes pink and  moist.  NECK:  No JVD and no carotid bruit.  LUNGS:  Clear bilaterally.  HEART:  Normal S1 and S2 with a soft systolic click.  Chest wall nontender.  ABDOMEN:  Soft and nontender.  EXTREMITIES:  No cyanosis, clubbing, or edema.  2+ pulses.  NEUROLOGY:  Cranial nerves II-XII grossly intact.   LABORATORY DATA:  Pending.   EKG; normal sinus rhythm.   IMPRESSION:  1.  Chest pain, rule out myocardial infarction.  2.  Anxiety.  3.  Cervical disk disease with radiculopathy.   PLAN:  Admit the patient to telemetry bed, rule out myocardial infarction,  and cardiac catheterization in the a.m.  The patient understood the risks,  benefits, and alternatives and wants me to proceed with a cardiac  catheterization.      ASK/MEDQ  D:  06/27/2004  T:  06/27/2004  Job:  [id number]

## 2010-09-14 NOTE — Cardiovascular Report (Signed)
[name], Kaitlin Reed                ACCOUNT NO.:  [id number]   MEDICAL RECORD NO.:  [id number]          PATIENT TYPE:  INP   LOCATION:  3701                         FACILITY:  [facility]   PHYSICIAN:  [name], M.D.  DATE OF BIRTH:  04-May-1950   DATE OF PROCEDURE:  06/28/2004  DATE OF DISCHARGE:                              CARDIAC CATHETERIZATION   PROCEDURE:  1.  Left heart catheterization.  2.  Selective coronary angiography.  3.  Left ventricular function study.   INDICATIONS:  This is a 60 year old black female with recurrent chest pain  and left arm pain along with history of smoking and abnormal stress test.   APPROACH:  Right femoral artery using 5-French sheath and catheters.   COMPLICATIONS:  None.   HEMODYNAMIC DATA:  The left ventricle pressure was 124/5/11 and aortic  pressure was 123/73/96.   LEFT VENTRICULOGRAM:  The left ventriculogram showed good left ventricular  systolic function.  It showed catheter-induced ventricular tachycardia with  mitral regurgitation.  The ejection fraction was 65%.   CORONARY ANATOMY:  The left main coronary artery was very short.   Left anterior descending coronary artery:  The left anterior descending  coronary artery showed proximal luminal irregularities with 20% lesion post  diagonal 1 origin.  The diagonal 1 vessel was unremarkable and diagonal 2  vessel was very small vessel.   Left circumflex coronary artery:  The left circumflex coronary artery was  unremarkable.  Its obtuse marginal branch 1 was a larger vessel and obtuse  marginal branch 2 was a very small vessel.   Right coronary artery:  The right coronary artery was dominant and  essentially unremarkable.  Its posterolateral branch and posterior  descending coronary artery were also unremarkable.   IMPRESSION:  1.  Minimal one vessel native vessel coronary artery disease.  2.  Normal left ventricular systolic function.   RECOMMENDATIONS:  This patient will be  treated medically with a small dose  of calcium channel blocker and lifestyle modification.      ASK/MEDQ  D:  06/28/2004  T:  06/28/2004  Job:  [id number]

## 2010-09-14 NOTE — Discharge Summary (Signed)
Kaitlin Reed, HARTL                ACCOUNT NO.:  [id number]   MEDICAL RECORD NO.:  [id number]          PATIENT TYPE:  INP   LOCATION:  3701                         FACILITY:  [facility]   PHYSICIAN:  [name], M.D.  DATE OF BIRTH:  05-01-1950   DATE OF ADMISSION:  06/27/2004  DATE OF DISCHARGE:  06/28/2004                                 DISCHARGE SUMMARY   PRINCIPAL DIAGNOSIS:  Non-cardiac chest pain.   PRINCIPAL PROCEDURE:  Left heart catheterization, selective coronary  angiography, left ventriculography study done by Dr. Orpah Cobb on June 28, 2004.   DISCHARGE MEDICATIONS:  1.  Aspirin 325 mg one daily.  2.  Xanax 0.25 mg one twice daily.  3.  Crestor 5 mg one daily.  4.  Pepcid over the counter or Prilosec, 20 mg, one daily as needed.  5.  Cipro 250 mg one twice daily.  6.  Pain management, patient is to use Tylenol as needed and directed.   ACTIVITY:  As tolerated after two days of sedentary lifestyle.   DIET:  Low salt diet.   WOUND CARE INSTRUCTION:  Patient to notify of right groin pain, swelling or  discharge.   FOLLOW UP:  Follow up by Dr. Orpah Cobb in two to four weeks.  Patient to  call [phone] for an appointment.   HISTORY OF PRESENT ILLNESS:  This 60 year old black female presented with  left sided chest pain along with left arm pain and recent normal cardiac  stress test with cardiac risk factors of smoking one half pack of cigarettes  per day for 20 years.   PHYSICAL EXAMINATION:  VITAL SIGNS:  Pulse 80, respirations 14, blood  pressure 118/70, height 5 feet, 6 inches, weight 160 pounds.  GENERAL APPEARANCE:  Patient was alert and oriented X3.  HEENT: Head was normocephalic, atraumatic with brown eyes.  Pupils equal and  reacting to light.  Extraocular movements intact. Ears, nose and throat  grossly unremarkable.  NECK:  No jugular venous distention and no carotid bruits.  LUNGS:  Clear bilaterally.  HEART:  Normal S1 and S2 with soft  systolic click.  Chest wall non tender.  ABDOMEN: Soft and non tender.  EXTREMITIES:  No edema, cyanosis or clubbing, 2+ pulses.  CNS:  Neurologically grossly  intact.   LABORATORY DATA:  Normal hemoglobin, hematocrit, white blood cell count,  platelet count, normal Pro Time and INR.  Near normal electrolytes, BUN and  creatinine, glucose except for potassium of 3.2.  CK elevated at 257, normal  CK-MB, troponin-I X2.  Cholesterol 187, triglycerides 49, HDL cholesterol  64, LDL cholesterol 113.  Urinalysis unremarkable.   Electrocardiogram normal sinus rhythm.   Chest x-ray large lung volume suggesting chronic obstructive pulmonary  disease.   HOSPITAL COURSE:  The patient was admitted to telemetry unit and myocardial  infarction was ruled out.  She underwent cardiac catheterization that showed  near normal coronaries with 20% lesion in the left anterior descending  coronary artery, posterior diagonal 1 origin.  The left ventricular systolic  function was normal.  Hence, patient was discharged home  in satisfactory  condition with outpatient re-evaluation of lungs and continuing home  medications with follow up by me in two to four weeks.      ASK/MEDQ  D:  08/23/2004  T:  08/24/2004  Job:  [id number]

## 2010-09-14 NOTE — H&P (Signed)
NAME:  Kaitlin Reed, Kaitlin Reed                ACCOUNT NO.:  [id number]   MEDICAL RECORD NO.:  [id number]          PATIENT TYPE:  OBV   LOCATION:  3040                         FACILITY:  [facility]   PHYSICIAN:  [name], M.D.  DATE OF BIRTH:  July 10, 1950   DATE OF ADMISSION:  06/14/2005  DATE OF DISCHARGE:                                HISTORY & PHYSICAL   CHIEF COMPLAINT:  Weakness.   HISTORY OF PRESENT ILLNESS:  This 60 year old black female complains of one  week of diarrhea, following a peanut butter intake.  The patient claims it  is the same peanut butter that had been recalled for salmonella infection,  and she also complains of weakness and dizziness.   PAST MEDICAL HISTORY:  1.  Negative for diabetes, hypertension, cholesterol level elevation,      myocardial infarction, obesity, or exercise.  2.  Positive for smoking a half a pack of cigarettes per day for 20+ years.  3.  No history of alcohol intake.   PAST SURGICAL HISTORY:  1.  Tubal pregnancy 30 years ago.  2.  Left breast tumor removal, 30+ years ago.   CURRENT MEDICATIONS:  1.  Xanax 0.25 mg at bedtime.  2.  Crestor 5 mg daily.  3.  Bayer Back and Body 325 mg one daily.  4.  KCl 10 mEq one twice a week.   ALLERGIES:  No known drug allergies.   PERSONAL HISTORY:  The patient is married x2.  Husband is 6 year old.  The  patient has two sons from the first marriage.   FAMILY HISTORY:  The patient has four brothers all living and well and eight  sisters all living and well.   REVIEW OF SYSTEMS:  The patient denies recent weight gain, weight loss,  hearing loss, cough, hemoptysis, asthma, COPD __________  GI bleed,  hepatitis, blood transfusion, kidney stone, stroke, seizure, or psychiatric  admission.  Positive for a recent change wearing glasses, partial upper  dentures, chest pain, joint pain, nausea, vomiting, dizziness.   PHYSICAL EXAMINATION:  VITAL SIGNS:  Pulse 90, respirations 16, blood  pressure 140/80,  on standing blood pressure was 130/80, height 5 feet 6  inches, weight 160 pounds.  GENERAL:  The patient was alert, oriented x3.  HEENT:  The patient is normocephalic/atraumatic with eyes brown.  Pupils are  equal, round, and reactive to light.  Extraocular movements intact.  Freckles over the face more than the rest of the body.  Tongue dry.  NECK:  No JVD.  No carotid bruit.  Full range of motion of the neck with  mild tenderness.  LUNGS:  Clear bilaterally.  HEART:  Normal S1 S2 with a grade 1/6 systolic murmur at the left sternal  border.  ABDOMEN:  Soft with tenderness over epigastric and left lower quadrant area.  Bowel sounds increased.  CNS:  Cranial nerves grossly intact.  Bilateral equal grips.   LABORATORY DATA:  Pending.   ASSESSMENT:  1.  Dehydration rule out salmonella infection.  2.  Cervical disk disease.  3.  Hyperlipidemia.   PLAN:  1.  Start  the patient on IV fluids.  2.  Check stool for enteric pathogen.  3.  Discharge home when stable.      [name], M.D.  Electronically Signed     ASK/MEDQ  D:  06/15/2005  T:  06/15/2005  Job:  [id number]

## 2010-09-14 NOTE — Discharge Summary (Signed)
[place]  Patient:    Kaitlin Reed, Kaitlin Reed                       MRN: [id number] Adm. Date:  [id number] Disc. Date: 12/07/00 Attending:  Robynn Pane                           Discharge Summary  ADMISSION DIAGNOSES: 1. Chest pain. 2. Left arm pain. 3. Rule out coronary insufficiency. 4. Rule out radiculopathy. 5. History of tobacco abuse.  FINAL DIAGNOSES: 1. Chest pain. 2. Rule out gastroesophageal reflux disease. 3. Rule out peptic ulcer disease. 4. Left arm pain. 5. Rule out radiculopathy. 6. Anxiety disorder. 7. History of heart murmur. 8. History of tobacco abuse.  DISCHARGE HOME MEDICATIONS: 1. Enteric-coated aspirin 325 mg 1 tablet daily. 2. Protonix 40 mg 1 tablet one-half hour before breakfast daily. 3. Xanax 0.25 mg 1 tablet twice daily. 4. Nitrostat 0.4 mg sublingual use as directed.  ACTIVITY:  As tolerated.  DIET:  Low salt, low cholesterol.  FOLLOW-UP:  With Dr. Algie Coffer in 7-10 days.  CONDITION ON DISCHARGE:  Stable.  HISTORY OF PRESENT ILLNESS:  [name] is a 60 year old black female with past medical history significant for heart murmur, history of tobacco abuse, came to the ER complaining of left-sided squeezing chest pain radiating to the neck, left arm off and on for the last two weeks.  Today while driving home felt severe pain, so decided to come to the ER.  The patient states pain increases with movement.  Denies any trauma, fall, or heavy lifting.  Denies nausea, vomiting, diaphoresis.  Denies exertional angina.  Denies PND, orthopnea, leg swelling.  Denies fever, chills, cough.  Denies history of neck trauma.  Denies palpitations, lightheadedness, or syncope.  Denies shortness of breath.  PAST MEDICAL HISTORY:  As above.  No history of rheumatic heart disease.  No history of MI or CHF.  No history of asthma or bronchitis.  PAST SURGICAL HISTORY: 1. Left ______  resection many years ago. 2.  Tubal ligation 30+ years ago for tubal pregnancy.  ALLERGIES:  No known drug allergies.  HOME MEDICATIONS:  Aspirin 1 tablet daily.  SOCIAL HISTORY:  Married, has two children.  Smoked one pack per day for 15+ years, quit three years ago, and now smokes two to three cigarettes per day. No history of alcohol abuse.  No history of drug abuse.  She works as a Social worker. Used to work for [employer] many years ago.  FAMILY HISTORY:  Father is 69.  He has arthritis.  Mother is 48.  She is in good health.  Two sisters are hypertensive.  PHYSICAL EXAMINATION:  GENERAL:  The patient is alert, awake, oriented x 3.  In no acute distress.  VITAL SIGNS:  Blood pressure 139/68, pulse 62 and regular.  HEENT:  Conjunctivae pink.  NECK:  Supple.  No JVD, no bruit.  Range of motion was normal.  LUNGS:  Clear to auscultation.  Without rhonchi or rales.  CARDIOVASCULAR:  Soft systolic murmur at the apex.  No S3, gallop.  ABDOMEN:  Soft.  Bowel sounds are present.  Nontender.  EXTREMITIES:  There was no clubbing, cyanosis, or edema.  LABORATORY DATA:  EKG showed normal sinus rhythm with poor R-wave progression in V1 to V3.  Hemoglobin 12.8, hematocrit 36.1, white count 5.6.  Two sets of cardiac enzymes were negative.  Sodium 142, potassium 3.4, chloride 110, bicarbonate 28, glucose 111, BUN 7, creatinine 0.9, cholesterol 173, triglycerides 45, HDL 60, LDL of 104.  HOSPITAL COURSE:  The patient was admitted to telemetry unit.  MI was ruled out by serial enzymes and EKG.  The patient had stabbing chest pain on December 06, 2000.  EKG done showed no acute ischemic changes.  The pain responded to one Darvocet.  The patient underwent stress Cardiolite on December 07, 2000, exercised for 7.43 minutes on Bruce protocol.  Complained of fatigue.  No chest pain was reported.  Peak heart rate achieved was 126, which was 75% of maximal predicted heart rate.  Peak blood pressure achieved was 160/80.   EKG showed no acute ischemic changes.  There were no arrhythmias noted. Cardiolite scan showed no evidence of ischemia.  Ejection fraction of 59%. The patient did not have any episodes of anginal pain during the hospital stay and will be discharged home on her home medications and will be followed by Dr. Algie Coffer in 7-10 days.  If the patient continues to have recurrent numbness in the left arm, will schedule her for MRI of the neck to rule out radiculopathy.  If she continues to have chest pain, will get GI workup as an outpatient.DD:  12/07/00 TD:  12/08/00 Job: [id number]

## 2010-09-14 NOTE — Discharge Summary (Signed)
NAME:  CAM, HARNDEN                ACCOUNT NO.:  [id number]   MEDICAL RECORD NO.:  [id number]          PATIENT TYPE:  OBV   LOCATION:  3040                         FACILITY:  [facility]   PHYSICIAN:  Mohan N. Sharyn Lull, M.D. DATE OF BIRTH:  [birth year]   DATE OF ADMISSION:  06/14/2005  DATE OF DISCHARGE:  06/16/2005                                 DISCHARGE SUMMARY   ADMITTING DIAGNOSES:  1.  Acute gastroenteritis, rule out Salmonella infection, rule out viral      infection.  2.  Dehydration.  3.  Cervical disk disease.  4.  Hyperlipidemia.  5.  Gastroesophageal reflux disease.  6.  Mild coronary artery disease.   DISCHARGE DIAGNOSES:  1.  Status post acute gastroenteritis.  2.  Status post dehydration.  3.  Hypercholesterolemia.  4.  Gastroesophageal reflux disease.  5.  Cervical disk disease.  6.  Questionable history of heart murmur.  7.  Mild coronary artery disease.   DISCHARGE HOME MEDICATIONS:  1.  Baby aspirin 81 mg one tablet daily.  2.  Nexium 40 mg one capsule daily a half-hour before breakfast.  3.  Crestor 5 mg one tablet daily as before.   DIET:  Low salt, low cholesterol.   FOLLOWUP:  Follow up with Dr. Algie Coffer in 1 week.   CONDITION AT DISCHARGE:  Stable.   BRIEF HISTORY:  Ms. Speir is a 60 year old black female with past medical  history significant for hypercholesterolemia, degenerative disk disease,  questionable heart murmur, history of mild coronary artery disease, who was  admitted by Dr. Algie Coffer on June 13, 2005 because of 1 week of diarrhea  following peanut butter intake.  The patient claims that it is the same  peanut butter that has been recalled for Salmonella infection and she also  complains of weakness and dizziness.   PAST MEDICAL HISTORY:  As above.   PAST SURGICAL HISTORY:  1.  She had tubal pregnancy 30 years ago.  2.  Left breast tumor removal 30+ years ago.   CURRENT MEDICATIONS AT HOME:  1.  Xanax 0.25 mg at bedtime.  2.   Crestor 5 mg daily.  3.  Bayer Aspirin 325 mg one tablet daily.  4.  K-Dur 10 mEq twice daily.   ALLERGIES:  No known drug allergies.   SOCIAL HISTORY:  The patient is married.  She has 2 sons from the first  marriage.  She smokes a half a pack per day for 20+ years.   FAMILY HISTORY:  The patient has 4 brothers, all living and well and 8  sisters, all living and well.   PHYSICAL EXAMINATION:  VITAL SIGNS:  Her blood pressure was 140/80.  Pulse  was 90.  No significant orthostatic changes.  GENERAL:  On examination, she was alert, awake, oriented x3, in no acute  distress.  NECK:  No JVD.  No carotid bruit.  Full range of motion.  LUNGS:  Clear to auscultation bilaterally.  CARDIOVASCULAR:  S1 and S2 were normal with soft 1/6 systolic murmur at the  left lower sternal border.  ABDOMEN:  Soft with  mild tenderness in the epigastric region.  EXTREMITIES:  There was no clubbing, cyanosis, or edema.  NEUROLOGIC:  Exam was grossly intact.   LABORATORY AND ACCESSORY CLINICAL DATA:  Her stool cultures for ova and  parasites are negative so far.  Her potassium was 3.4, which has been  replaced today and potassium was 3.7.  Her BUN was 11, creatinine 1.0.  Hemoglobin was 12.3, hematocrit 35.3, white count of 5.3.  Her liver enzymes  were normal.  Her blood sugar was 100; repeat fasting sugar was 99.   BRIEF HOSPITAL COURSE:  The patient was admitted to a regular floor, was  started on IV fluids and stool was sent for ova and parasite and enteric  pathogens; so far, stool cultures have been negative.  The patient's diet  was gradually advanced.  The patient did not have any further episodes of  nausea, vomiting or diarrhea during the hospital stay.  The patient remained  afebrile during the hospital stay.  The patient will be discharged home on  the above medications and will be followed by Dr. Shana Chute in 1 week and at  that, they will follow the final stool cultures as an  outpatient.           ______________________________  Eduardo Osier. Sharyn Lull, M.D.     [facility]  D:  06/16/2005  T:  06/17/2005  Job:  [id number]   cc:   [name], M.D.  Fax: [phone]

## 2010-11-13 ENCOUNTER — Other Ambulatory Visit: Payer: Self-pay | Admitting: Cardiovascular Disease

## 2010-11-13 DIAGNOSIS — Z1231 Encounter for screening mammogram for malignant neoplasm of breast: Secondary | ICD-10-CM

## 2010-11-29 ENCOUNTER — Other Ambulatory Visit: Payer: Self-pay | Admitting: Internal Medicine

## 2010-11-29 ENCOUNTER — Ambulatory Visit
Admission: RE | Admit: 2010-11-29 | Discharge: 2010-11-29 | Disposition: A | Discharge status: Home / Self Care | Source: Ambulatory Visit | Attending: Radiation Oncology | Admitting: Radiation Oncology

## 2010-11-29 ENCOUNTER — Ambulatory Visit (HOSPITAL_COMMUNITY)
Admission: RE | Admit: 2010-11-29 | Discharge: 2010-11-29 | Disposition: A | Discharge status: Home / Self Care | Source: Ambulatory Visit | Attending: Internal Medicine | Admitting: Internal Medicine

## 2010-11-29 ENCOUNTER — Encounter (HOSPITAL_BASED_OUTPATIENT_CLINIC_OR_DEPARTMENT_OTHER): Admitting: Internal Medicine

## 2010-11-29 DIAGNOSIS — C3430 Malignant neoplasm of lower lobe, unspecified bronchus or lung: Secondary | ICD-10-CM

## 2010-11-29 DIAGNOSIS — C3490 Malignant neoplasm of unspecified part of unspecified bronchus or lung: Secondary | ICD-10-CM

## 2010-11-29 DIAGNOSIS — I319 Disease of pericardium, unspecified: Secondary | ICD-10-CM | POA: Insufficient documentation

## 2010-11-29 LAB — CBC WITH DIFFERENTIAL/PLATELET
BASO%: 0.4 % (ref 0.0–2.0)
Eosinophils Absolute: 0 10*3/uL (ref 0.0–0.5)
MCHC: 34.5 g/dL (ref 31.5–36.0)
MCV: 93.5 fL (ref 79.5–101.0)
MONO%: 9.2 % (ref 0.0–14.0)
NEUT#: 1.6 10*3/uL (ref 1.5–6.5)
RBC: 3.9 10*6/uL (ref 3.70–5.45)
RDW: 12.5 % (ref 11.2–14.5)
WBC: 3.1 10*3/uL — ABNORMAL LOW (ref 3.9–10.3)

## 2010-11-29 LAB — CMP (CANCER CENTER ONLY)
ALT(SGPT): 16 U/L (ref 10–47)
AST: 29 U/L (ref 11–38)
Albumin: 3.4 g/dL (ref 3.3–5.5)
Alkaline Phosphatase: 126 U/L — ABNORMAL HIGH (ref 26–84)
Glucose, Bld: 105 mg/dL (ref 73–118)
Potassium: 4.1 mEq/L (ref 3.3–4.7)
Sodium: 146 mEq/L — ABNORMAL HIGH (ref 128–145)
Total Protein: 7.4 g/dL (ref 6.4–8.1)

## 2010-11-29 MED ORDER — IOHEXOL 300 MG/ML  SOLN
80.0000 mL | Freq: Once | INTRAMUSCULAR | Status: AC | PRN
  Administered 2010-11-29: 80 mL via INTRAVENOUS

## 2010-12-04 ENCOUNTER — Other Ambulatory Visit: Payer: Self-pay | Admitting: Internal Medicine

## 2010-12-04 ENCOUNTER — Encounter (HOSPITAL_BASED_OUTPATIENT_CLINIC_OR_DEPARTMENT_OTHER): Admitting: Internal Medicine

## 2010-12-04 DIAGNOSIS — C3490 Malignant neoplasm of unspecified part of unspecified bronchus or lung: Secondary | ICD-10-CM

## 2010-12-04 DIAGNOSIS — C3430 Malignant neoplasm of lower lobe, unspecified bronchus or lung: Secondary | ICD-10-CM

## 2010-12-26 ENCOUNTER — Ambulatory Visit

## 2011-01-01 ENCOUNTER — Ambulatory Visit
Admission: RE | Admit: 2011-01-01 | Discharge: 2011-01-01 | Disposition: A | Discharge status: Home / Self Care | Source: Ambulatory Visit | Attending: Cardiovascular Disease | Admitting: Cardiovascular Disease

## 2011-01-01 DIAGNOSIS — Z1231 Encounter for screening mammogram for malignant neoplasm of breast: Secondary | ICD-10-CM

## 2011-01-24 LAB — DIFFERENTIAL
Basophils Absolute: 0
Basophils Relative: 0
Eosinophils Absolute: 0
Lymphs Abs: 1.8
Neutrophils Relative %: 57

## 2011-01-24 LAB — CBC
MCV: 89
Platelets: 354
WBC: 4.8

## 2011-01-24 LAB — BASIC METABOLIC PANEL
BUN: 9
Chloride: 106
Creatinine, Ser: 0.91

## 2011-02-12 ENCOUNTER — Ambulatory Visit
Admission: RE | Admit: 2011-02-12 | Discharge: 2011-02-12 | Disposition: A | Discharge status: Home / Self Care | Source: Ambulatory Visit | Attending: Cardiovascular Disease | Admitting: Cardiovascular Disease

## 2011-02-12 ENCOUNTER — Other Ambulatory Visit: Payer: Self-pay | Admitting: Cardiovascular Disease

## 2011-02-12 DIAGNOSIS — M549 Dorsalgia, unspecified: Secondary | ICD-10-CM

## 2011-03-04 ENCOUNTER — Ambulatory Visit (HOSPITAL_COMMUNITY)
Admission: RE | Admit: 2011-03-04 | Discharge: 2011-03-04 | Disposition: A | Discharge status: Home / Self Care | Source: Ambulatory Visit | Attending: Internal Medicine | Admitting: Internal Medicine

## 2011-03-04 ENCOUNTER — Other Ambulatory Visit: Payer: Self-pay | Admitting: Internal Medicine

## 2011-03-04 ENCOUNTER — Other Ambulatory Visit (HOSPITAL_BASED_OUTPATIENT_CLINIC_OR_DEPARTMENT_OTHER): Admitting: Lab

## 2011-03-04 DIAGNOSIS — Z902 Acquired absence of lung [part of]: Secondary | ICD-10-CM | POA: Insufficient documentation

## 2011-03-04 DIAGNOSIS — J438 Other emphysema: Secondary | ICD-10-CM | POA: Insufficient documentation

## 2011-03-04 DIAGNOSIS — C3490 Malignant neoplasm of unspecified part of unspecified bronchus or lung: Secondary | ICD-10-CM | POA: Insufficient documentation

## 2011-03-04 DIAGNOSIS — R911 Solitary pulmonary nodule: Secondary | ICD-10-CM | POA: Insufficient documentation

## 2011-03-04 DIAGNOSIS — C3430 Malignant neoplasm of lower lobe, unspecified bronchus or lung: Secondary | ICD-10-CM

## 2011-03-04 LAB — CMP (CANCER CENTER ONLY)
ALT(SGPT): 11 U/L (ref 10–47)
Albumin: 3.5 g/dL (ref 3.3–5.5)
CO2: 32 mEq/L (ref 18–33)
Chloride: 102 mEq/L (ref 98–108)
Glucose, Bld: 99 mg/dL (ref 73–118)
Potassium: 3.8 mEq/L (ref 3.3–4.7)
Sodium: 146 mEq/L — ABNORMAL HIGH (ref 128–145)
Total Protein: 7.7 g/dL (ref 6.4–8.1)

## 2011-03-04 LAB — CBC WITH DIFFERENTIAL/PLATELET
Eosinophils Absolute: 0.1 10*3/uL (ref 0.0–0.5)
MONO#: 0.4 10*3/uL (ref 0.1–0.9)
NEUT#: 1.9 10*3/uL (ref 1.5–6.5)
Platelets: 303 10*3/uL (ref 145–400)
RBC: 4.17 10*6/uL (ref 3.70–5.45)
RDW: 12.5 % (ref 11.2–14.5)
WBC: 3.7 10*3/uL — ABNORMAL LOW (ref 3.9–10.3)
lymph#: 1.3 10*3/uL (ref 0.9–3.3)

## 2011-03-04 MED ORDER — IOHEXOL 300 MG/ML  SOLN
80.0000 mL | Freq: Once | INTRAMUSCULAR | Status: AC | PRN
  Administered 2011-03-04: 80 mL via INTRAVENOUS

## 2011-03-26 ENCOUNTER — Other Ambulatory Visit: Payer: Self-pay | Admitting: Internal Medicine

## 2011-03-26 ENCOUNTER — Telehealth: Payer: Self-pay | Admitting: Internal Medicine

## 2011-03-26 NOTE — Telephone Encounter (Signed)
Called pt,left message regarding appt with MD 12/18

## 2011-03-27 ENCOUNTER — Other Ambulatory Visit: Payer: Self-pay | Admitting: Cardiovascular Disease

## 2011-03-27 ENCOUNTER — Ambulatory Visit
Admission: RE | Admit: 2011-03-27 | Discharge: 2011-03-27 | Disposition: A | Discharge status: Home / Self Care | Source: Ambulatory Visit | Attending: Cardiovascular Disease | Admitting: Cardiovascular Disease

## 2011-03-27 DIAGNOSIS — R51 Headache: Secondary | ICD-10-CM

## 2011-04-16 ENCOUNTER — Observation Stay (HOSPITAL_COMMUNITY)
Admission: AD | Admit: 2011-04-16 | Discharge: 2011-04-17 | Disposition: A | Discharge status: Home / Self Care | Source: Ambulatory Visit | Attending: Cardiovascular Disease | Admitting: Cardiovascular Disease

## 2011-04-16 ENCOUNTER — Encounter: Payer: Self-pay | Admitting: Internal Medicine

## 2011-04-16 ENCOUNTER — Observation Stay (HOSPITAL_COMMUNITY)

## 2011-04-16 ENCOUNTER — Encounter (HOSPITAL_COMMUNITY): Payer: Self-pay | Admitting: General Practice

## 2011-04-16 ENCOUNTER — Ambulatory Visit (HOSPITAL_BASED_OUTPATIENT_CLINIC_OR_DEPARTMENT_OTHER): Admitting: Internal Medicine

## 2011-04-16 ENCOUNTER — Telehealth: Payer: Self-pay | Admitting: Internal Medicine

## 2011-04-16 VITALS — BP 125/68 | HR 101 | Temp 96.8°F | Wt 171.6 lb

## 2011-04-16 DIAGNOSIS — C3490 Malignant neoplasm of unspecified part of unspecified bronchus or lung: Secondary | ICD-10-CM | POA: Diagnosis present

## 2011-04-16 DIAGNOSIS — Z87891 Personal history of nicotine dependence: Secondary | ICD-10-CM | POA: Insufficient documentation

## 2011-04-16 DIAGNOSIS — Z8673 Personal history of transient ischemic attack (TIA), and cerebral infarction without residual deficits: Secondary | ICD-10-CM | POA: Insufficient documentation

## 2011-04-16 DIAGNOSIS — R209 Unspecified disturbances of skin sensation: Secondary | ICD-10-CM | POA: Insufficient documentation

## 2011-04-16 DIAGNOSIS — R599 Enlarged lymph nodes, unspecified: Secondary | ICD-10-CM

## 2011-04-16 DIAGNOSIS — R0789 Other chest pain: Principal | ICD-10-CM | POA: Diagnosis present

## 2011-04-16 DIAGNOSIS — Z923 Personal history of irradiation: Secondary | ICD-10-CM

## 2011-04-16 DIAGNOSIS — Z23 Encounter for immunization: Secondary | ICD-10-CM | POA: Insufficient documentation

## 2011-04-16 DIAGNOSIS — Z79899 Other long term (current) drug therapy: Secondary | ICD-10-CM | POA: Insufficient documentation

## 2011-04-16 DIAGNOSIS — M5030 Other cervical disc degeneration, unspecified cervical region: Secondary | ICD-10-CM | POA: Insufficient documentation

## 2011-04-16 DIAGNOSIS — Z9221 Personal history of antineoplastic chemotherapy: Secondary | ICD-10-CM

## 2011-04-16 DIAGNOSIS — E785 Hyperlipidemia, unspecified: Secondary | ICD-10-CM | POA: Insufficient documentation

## 2011-04-16 DIAGNOSIS — F411 Generalized anxiety disorder: Secondary | ICD-10-CM | POA: Insufficient documentation

## 2011-04-16 LAB — COMPREHENSIVE METABOLIC PANEL
ALT: 6 U/L (ref 0–35)
AST: 20 U/L (ref 0–37)
Albumin: 3.8 g/dL (ref 3.5–5.2)
Alkaline Phosphatase: 151 U/L — ABNORMAL HIGH (ref 39–117)
CO2: 26 mEq/L (ref 19–32)
Chloride: 105 mEq/L (ref 96–112)
GFR calc non Af Amer: 66 mL/min — ABNORMAL LOW (ref >90)
Potassium: 3.4 mEq/L — ABNORMAL LOW (ref 3.5–5.1)
Total Bilirubin: 0.3 mg/dL (ref 0.3–1.2)

## 2011-04-16 LAB — CBC
Hemoglobin: 12 g/dL (ref 12.0–15.0)
MCHC: 34.2 g/dL (ref 30.0–36.0)
RDW: 12.2 % (ref 11.5–15.5)
WBC: 4.5 10*3/uL (ref 4.0–10.5)

## 2011-04-16 LAB — DIFFERENTIAL
Basophils Absolute: 0 10*3/uL (ref 0.0–0.1)
Basophils Relative: 0 % (ref 0–1)
Lymphocytes Relative: 34 % (ref 12–46)
Neutro Abs: 2.6 10*3/uL (ref 1.7–7.7)
Neutrophils Relative %: 59 % (ref 43–77)

## 2011-04-16 LAB — APTT: aPTT: 30 seconds (ref 24–37)

## 2011-04-16 LAB — CARDIAC PANEL(CRET KIN+CKTOT+MB+TROPI)
CK, MB: 2.9 ng/mL (ref 0.3–4.0)
Relative Index: 1.3 (ref 0.0–2.5)
Total CK: 227 U/L — ABNORMAL HIGH (ref 7–177)

## 2011-04-16 MED ORDER — ROSUVASTATIN CALCIUM 10 MG PO TABS
10.0000 mg | ORAL_TABLET | Freq: Every day | ORAL | Status: DC
  Administered 2011-04-16: 10 mg via ORAL
  Filled 2011-04-16 (×2): qty 1

## 2011-04-16 MED ORDER — INFLUENZA VIRUS VACC SPLIT PF IM SUSP
0.5000 mL | INTRAMUSCULAR | Status: AC
  Administered 2011-04-17: 0.5 mL via INTRAMUSCULAR
  Filled 2011-04-16: qty 0.5

## 2011-04-16 MED ORDER — SODIUM CHLORIDE 0.9 % IJ SOLN: 3.0000 mL | INTRAMUSCULAR | Status: DC | PRN

## 2011-04-16 MED ORDER — CLOPIDOGREL BISULFATE 75 MG PO TABS
75.0000 mg | ORAL_TABLET | Freq: Once | ORAL | Status: AC
  Administered 2011-04-16: 75 mg via ORAL
  Filled 2011-04-16: qty 1

## 2011-04-16 MED ORDER — ASPIRIN 81 MG PO CHEW
324.0000 mg | CHEWABLE_TABLET | ORAL | Status: AC
  Administered 2011-04-16: 324 mg via ORAL
  Filled 2011-04-16: qty 4

## 2011-04-16 MED ORDER — ALPRAZOLAM 0.25 MG PO TABS: 0.2500 mg | ORAL_TABLET | Freq: Two times a day (BID) | ORAL | Status: DC | PRN

## 2011-04-16 MED ORDER — CLOPIDOGREL BISULFATE 75 MG PO TABS
75.0000 mg | ORAL_TABLET | Freq: Every day | ORAL | Status: DC
  Administered 2011-04-17: 75 mg via ORAL
  Filled 2011-04-16: qty 1

## 2011-04-16 MED ORDER — ACETAMINOPHEN 325 MG PO TABS: 650.0000 mg | ORAL_TABLET | ORAL | Status: DC | PRN

## 2011-04-16 MED ORDER — SODIUM CHLORIDE 0.9 % IV SOLN: 250.0000 mL | INTRAVENOUS | Status: DC | PRN

## 2011-04-16 MED ORDER — HEPARIN SOD (PORCINE) IN D5W 100 UNIT/ML IV SOLN
1050.0000 [IU]/h | INTRAVENOUS | Status: DC
  Administered 2011-04-16: 1050 [IU]/h via INTRAVENOUS
  Filled 2011-04-16 (×3): qty 250

## 2011-04-16 MED ORDER — HEPARIN BOLUS VIA INFUSION
4000.0000 [IU] | Freq: Once | INTRAVENOUS | Status: AC
  Administered 2011-04-16: 4000 [IU] via INTRAVENOUS
  Filled 2011-04-16: qty 4000

## 2011-04-16 MED ORDER — ASPIRIN 300 MG RE SUPP
300.0000 mg | RECTAL | Status: AC
  Filled 2011-04-16: qty 1

## 2011-04-16 MED ORDER — ONDANSETRON HCL 4 MG/2ML IJ SOLN: 4.0000 mg | Freq: Four times a day (QID) | INTRAMUSCULAR | Status: DC | PRN

## 2011-04-16 MED ORDER — NITROGLYCERIN 0.4 MG SL SUBL: 0.4000 mg | SUBLINGUAL_TABLET | SUBLINGUAL | Status: DC | PRN

## 2011-04-16 MED ORDER — SODIUM CHLORIDE 0.9 % IJ SOLN
3.0000 mL | Freq: Two times a day (BID) | INTRAMUSCULAR | Status: DC
  Administered 2011-04-16 – 2011-04-17 (×2): 3 mL via INTRAVENOUS

## 2011-04-16 MED ORDER — POTASSIUM CHLORIDE CRYS ER 20 MEQ PO TBCR
20.0000 meq | EXTENDED_RELEASE_TABLET | Freq: Two times a day (BID) | ORAL | Status: DC
  Administered 2011-04-16 – 2011-04-17 (×2): 20 meq via ORAL
  Filled 2011-04-16 (×3): qty 1

## 2011-04-16 MED ORDER — ASPIRIN EC 81 MG PO TBEC
81.0000 mg | DELAYED_RELEASE_TABLET | Freq: Every day | ORAL | Status: DC
  Administered 2011-04-17: 81 mg via ORAL
  Filled 2011-04-16: qty 1

## 2011-04-16 MED ORDER — METOPROLOL TARTRATE 12.5 MG HALF TABLET
12.5000 mg | ORAL_TABLET | Freq: Two times a day (BID) | ORAL | Status: DC
  Administered 2011-04-16 – 2011-04-17 (×2): 12.5 mg via ORAL
  Filled 2011-04-16 (×3): qty 1

## 2011-04-16 NOTE — Telephone Encounter (Signed)
gve the pt her dec 2012 appt calendar along with the ct scan

## 2011-04-16 NOTE — Progress Notes (Signed)
ANTICOAGULATION CONSULT NOTE - Initial Consult  Pharmacy Consult for Heparin Indication: chest pain/ACS  Allergies  Allergen Reactions  . Codeine   . Motrin (Ibuprofen)   . Other Nausea Only    steroids  . Trazodone And Nefazodone     Patient Measurements: Height: 5\' 6"  (167.6 cm) Weight: 173 lb 14.4 oz (78.881 kg) IBW/kg (Calculated) : 59.3  Adjusted Body Weight: 75.6 kg  Vital Signs: Temp: 97.7 F (36.5 C) (12/18 1818) Temp src: Oral (12/18 1818) BP: 125/81 mmHg (12/18 1818) Pulse Rate: 105  (12/18 1818)  Labs: No results found for this basename: HGB:2,HCT:3,PLT:3,APTT:3,LABPROT:3,INR:3,HEPARINUNFRC:3,CREATININE:3,CKTOTAL:3,CKMB:3,TROPONINI:3 in the last 72 hours Estimated Creatinine Clearance: 68.7 ml/min (by C-G formula based on Cr of 0.9).  Medical History: Past Medical History  Diagnosis Date  . lung ca dx'd 06/2009    chemo comp 07/2010; xrt comp 06/2010  . Anxiety   . Hypercholesteremia   . Heart murmur   . TIA (transient ischemic attack)   . Tubal pregnancy     Medications:  Prescriptions prior to admission  Medication Sig Dispense Refill  . Ascorbic Acid (VITAMIN C PO) Take by mouth daily.        Marland Kitchen aspirin 81 MG tablet Take 81 mg by mouth daily.        Marland Kitchen FOLIC ACID PO Take by mouth daily.        . IRON COMBINATIONS PO Take by mouth daily.        . IRON PO Take by mouth daily.        Marland Kitchen VITAMIN D, CHOLECALCIFEROL, PO Take by mouth daily.          Assessment: 60 y/o female admitted for ACS. Baseline Hgb and platelets on 03/04/11 WNL. Start therapeutic heparin dosing and obtain baseline labs for this admission.  Goal of Therapy:  Heparin level 0.3-0.7 units/ml   Plan:  4000 units bolus and infusion of 1050 units/hr. Check heparin level in 6-8 hrs and daily.   Merilynn Finland, [employer] 04/16/2011,8:16 PM
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
## 2011-04-16 NOTE — H&P (Signed)
Kaitlin Reed is an 60 y.o. female.   Chief Complaint: Left sided tingling and numbness. HPI: 60 years old black female has one day history of left sided tingling numbness with some jaw discomfort. No fever. No nausea or sweating spell.  Past Medical History  Diagnosis Date  . lung ca dx'd 06/2009    chemo comp 07/2010; xrt comp 06/2010  . Anxiety   . Hypercholesteremia   . Heart murmur   . TIA (transient ischemic attack)   . Tubal pregnancy       No past surgical history on file.  Family History  Problem Relation Age of Onset  . Hypertension Father   . Cancer Sister    Social History:  reports that she has quit smoking. Her smoking use included Cigarettes. She has a 26 pack-year smoking history. She does not have any smokeless tobacco history on file. She reports that she does not drink alcohol or use illicit drugs.  Allergies:  Allergies  Allergen Reactions  . Codeine Nausea And Vomiting  . Motrin (Ibuprofen) Nausea And Vomiting  . Other Nausea Only    steroids  . Trazodone And Nefazodone Nausea And Vomiting    Medications Prior to Admission  Medication Dose Route Frequency Provider Last Rate Last Dose  . 0.9 %  sodium chloride infusion  250 mL Intravenous PRN [name], MD      . acetaminophen (TYLENOL) tablet 650 mg  650 mg Oral Q4H PRN [name], MD      . ALPRAZolam Prudy Feeler) tablet 0.25 mg  0.25 mg Oral BID PRN [name], MD      . aspirin chewable tablet 324 mg  324 mg Oral NOW [name], MD       Or  . aspirin suppository 300 mg  300 mg Rectal NOW [name], MD      . aspirin EC tablet 81 mg  81 mg Oral Daily [name], MD      . clopidogrel (PLAVIX) tablet 75 mg  75 mg Oral Once [name], MD      . clopidogrel (PLAVIX) tablet 75 mg  75 mg Oral Q breakfast [name], MD      . heparin ADULT infusion 100 units/ml (25000 units/250 ml)  1,050 Units/hr Intravenous Continuous Crystal Stillinger Robertson, PHARMD      . heparin  bolus via infusion 4,000 Units  4,000 Units Intravenous Once [employer], PHARMD      . metoprolol tartrate (LOPRESSOR) tablet 12.5 mg  12.5 mg Oral BID [name], MD      . nitroGLYCERIN (NITROSTAT) SL tablet 0.4 mg  0.4 mg Sublingual Q5 min PRN [name], MD      . ondansetron (ZOFRAN) injection 4 mg  4 mg Intravenous Q6H PRN [name], MD      . rosuvastatin (CRESTOR) tablet 10 mg  10 mg Oral QHS [name], MD      . sodium chloride 0.9 % injection 3 mL  3 mL Intravenous Q12H [name], MD      . sodium chloride 0.9 % injection 3 mL  3 mL Intravenous PRN [name], MD       No current outpatient prescriptions on file as of 04/16/2011.    No results found for this or any previous visit (from the past 48 hour(s)). No results found.  ROS: + wears reading glasses, + partial upper and  lower dentures, No cough, asthma or pneumonias. + palpitations, No gi bleed, + lung cancer. No gastrointestinal or genitourinary bleeding, No stroke, seizures or psychiatric admission.  Blood pressure 125/81, pulse 105, temperature 97.7 F (36.5 C), temperature source Oral, resp. rate 18, height 5\' 6"  (1.676 m), weight 78.881 kg (173 lb 14.4 oz), SpO2 95.00%. General appearance: alert, cooperative and appears stated age Head: Normocephalic, without obvious abnormality, atraumatic Eyes: Brown eyes, conjunctivae/corneas clear. PERRL, EOM's intact. Throat: lips, mucosa, and tongue normal; teeth and gums normal Neck: no carotid bruit, no JVD, supple, symmetrical, trachea midline and thyroid not enlarged, symmetric, palpable right anterior cervical lymph node. Resp: clear to auscultation bilaterally Cardio: regular rate and rhythm, S1, S2 normal, II/VI systolic murmur GI: soft, non-tender; bowel sounds normal; no masses,  no organomegaly Extremities: extremities normal, atraumatic, no cyanosis or edema Skin: Skin color, texture, turgor normal. Multiple freckles over  face. Neurologic: Alert and oriented X 3, normal strength and tone. Normal symmetric reflexes. Normal coordination and gait. Right handed  Assessment Atypical chest pain Left sided numbness Non-small cell lung cancer  Plan R/O MI Nuclear stress test MRI/MRA brain rule out metastatic lesion  Kaitlin Reed S 04/16/2011, 9:14 PM

## 2011-04-17 ENCOUNTER — Observation Stay (HOSPITAL_COMMUNITY)

## 2011-04-17 ENCOUNTER — Other Ambulatory Visit: Payer: Self-pay

## 2011-04-17 LAB — CARDIAC PANEL(CRET KIN+CKTOT+MB+TROPI)
Relative Index: 1.2 (ref 0.0–2.5)
Relative Index: 1.4 (ref 0.0–2.5)
Total CK: 185 U/L — ABNORMAL HIGH (ref 7–177)
Troponin I: <0.3 ng/mL (ref <0.30)

## 2011-04-17 LAB — CBC
HCT: 34 % — ABNORMAL LOW (ref 36.0–46.0)
Hemoglobin: 11.4 g/dL — ABNORMAL LOW (ref 12.0–15.0)
MCH: 30.2 pg (ref 26.0–34.0)
MCV: 89.9 fL (ref 78.0–100.0)
RBC: 3.78 MIL/uL — ABNORMAL LOW (ref 3.87–5.11)

## 2011-04-17 LAB — BASIC METABOLIC PANEL
BUN: 13 mg/dL (ref 6–23)
Calcium: 9.3 mg/dL (ref 8.4–10.5)
Creatinine, Ser: 0.95 mg/dL (ref 0.50–1.10)
GFR calc non Af Amer: 63 mL/min — ABNORMAL LOW (ref >90)
Glucose, Bld: 107 mg/dL — ABNORMAL HIGH (ref 70–99)

## 2011-04-17 LAB — HEPARIN LEVEL (UNFRACTIONATED): Heparin Unfractionated: 0.61 IU/mL (ref 0.30–0.70)

## 2011-04-17 LAB — LIPID PANEL: Cholesterol: 190 mg/dL (ref 0–200)

## 2011-04-17 MED ORDER — CYCLOBENZAPRINE HCL 5 MG PO TABS: 5.0000 mg | ORAL_TABLET | Freq: Three times a day (TID) | ORAL | Status: AC | PRN

## 2011-04-17 MED ORDER — CYCLOBENZAPRINE HCL 5 MG PO TABS
5.0000 mg | ORAL_TABLET | Freq: Three times a day (TID) | ORAL | Status: DC | PRN
  Filled 2011-04-17: qty 1

## 2011-04-17 MED ORDER — REGADENOSON 0.4 MG/5ML IV SOLN
0.4000 mg | Freq: Once | INTRAVENOUS | Status: AC
  Administered 2011-04-17: 0.4 mg via INTRAVENOUS

## 2011-04-17 MED ORDER — HEPARIN SOD (PORCINE) IN D5W 100 UNIT/ML IV SOLN
850.0000 [IU]/h | INTRAVENOUS | Status: DC
Start: 2011-04-17 — End: 2011-04-17
  Filled 2011-04-17: qty 250

## 2011-04-17 MED ORDER — TECHNETIUM TC 99M TETROFOSMIN IV KIT
10.0000 | PACK | Freq: Once | INTRAVENOUS | Status: AC | PRN
  Administered 2011-04-17: 10 via INTRAVENOUS

## 2011-04-17 MED ORDER — TECHNETIUM TC 99M TETROFOSMIN IV KIT
30.0000 | PACK | Freq: Once | INTRAVENOUS | Status: AC | PRN
  Administered 2011-04-17: 30 via INTRAVENOUS

## 2011-04-17 NOTE — Progress Notes (Signed)
ANTICOAGULATION CONSULT NOTE - Follow Up Consult  Pharmacy Consult for Heparin Indication: chest pain/ACS  Allergies  Allergen Reactions  . Codeine Nausea And Vomiting  . Motrin (Ibuprofen) Nausea And Vomiting  . Other Nausea Only    steroids  . Trazodone And Nefazodone Nausea And Vomiting    Patient Measurements: Height: 5\' 6"  (167.6 cm) Weight: 172 lb 6.4 oz (78.2 kg) IBW/kg (Calculated) : 59.3  Heparin Dosing Weight: 75 kg  Vital Signs: Temp: 98 F (36.7 C) (12/19 0800) Temp src: Oral (12/19 0800) BP: 115/64 mmHg (12/19 1130) Pulse Rate: 93  (12/19 1130)  Labs:  Basename 04/17/11 1135 04/17/11 0905 04/17/11 0242 04/16/11 2040  HGB -- -- 11.4* 12.0  HCT -- -- 34.0* 35.1*  PLT -- -- 276 293  APTT -- -- -- 30  LABPROT -- -- -- 14.0  INR -- -- -- 1.06  HEPARINUNFRC 0.61 -- 0.67 --  CREATININE -- -- 0.95 0.91  CKTOTAL -- 164 185* 227*  CKMB -- 2.3 2.3 2.9  TROPONINI -- <0.30 <0.30 <0.30   Estimated Creatinine Clearance: 64.8 ml/min (by C-G formula based on Cr of 0.95).   Medications:  Scheduled:    . aspirin  324 mg Oral NOW   Or  . aspirin  300 mg Rectal NOW  . aspirin EC  81 mg Oral Daily  . clopidogrel  75 mg Oral Once  . clopidogrel  75 mg Oral Q breakfast  . heparin  4,000 Units Intravenous Once  . influenza  inactive virus vaccine  0.5 mL Intramuscular Tomorrow-1000  . metoprolol tartrate  12.5 mg Oral BID  . potassium chloride  20 mEq Oral BID  . regadenoson  0.4 mg Intravenous Once  . rosuvastatin  10 mg Oral QHS  . sodium chloride  3 mL Intravenous Q12H   Infusions:    . heparin 950 Units/hr (04/17/11 0506)  . DISCONTD: heparin 1,050 Units/hr (04/16/11 2310)    Assessment: 60 yo F on heparin for atypical CP.  Heparin level = 0.61.  Agree with aiming for 0.3-0.5 goal considering TIA history and presentation of tingling/numbness on L side.  Noted patient currently off floor at nuc med for imaging.  Goal of Therapy:  Heparin level 0.3-0.5   Plan:  Reduce heparin infusion to 850 units/hr.  Follow up with AM labs.  Follow up imaging results.  [employer], Pharm.D., BCPS Clinical Pharmacist Pager [phone]  04/17/2011,1:55 PM

## 2011-04-17 NOTE — Progress Notes (Signed)
Heparin level 0.67 goal 0.3-0.7. No bleeding reported. Because of left sided weakeness and hx of tia will reduce rate to hopefully attain a range of 0.3-0.5 (stroke goal) plan to decrease rate to 950 units/hr and recheck in 6 hours. Of note enzymes are negative times 2.   Janice Coffin

## 2011-04-17 NOTE — Discharge Summary (Signed)
Physician Discharge Summary  Patient ID: Kaitlin Reed MRN: [id number] DOB/AGE: 01/04/1951 60 y.o.  Admit date: 04/16/2011 Discharge date: 04/17/2011  Admission Diagnoses: Chest pain, atypical Non-small cell lung cancer Discharge Diagnoses:  Principal Problem:  *Chest pain radiating to arm Active Problems:  Non-small cell lung cancer Cervical disc disease  Discharged Condition: good  Hospital Course: 60 years old black female with left sided hand tingling + jaw discomfort, underwent nuclear stress test with no reversible ischemia. EF 76 %. 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 Her symptoms could be from cervical disc disease and she will call Dr. Jeral Fruit for reevaluation. She was discharged home in satisfactory condition.  Consults: none  Significant Diagnostic Studies: labs: Negative cardiac enzymes, radiology: MRI: brain-no infarct and nuclear medicine: No reversible ischemia.  Treatments: analgesia: Aspirin and flexeril.  Discharge Exam: Blood pressure 111/73, pulse 88, temperature 98.4 F (36.9 C), temperature source Oral, resp. rate 18, height 5\' 6"  (1.676 m), weight 78.2 kg (172 lb 6.4 oz), SpO2 96.00%. General appearance: alert, cooperative and appears stated age  Head: Normocephalic, without obvious abnormality, atraumatic  Eyes: Brown eyes, conjunctivae/corneas clear. PERRL, EOM's intact.  Throat: lips, mucosa, and tongue normal; teeth and gums normal  Neck: no carotid bruit, no JVD, supple, symmetrical, trachea midline and thyroid not enlarged, symmetric, palpable right anterior cervical lymph node.  Resp: clear to auscultation bilaterally  Cardio: regular rate and rhythm, S1, S2 normal, II/VI systolic murmur  GI: soft, non-tender; bowel sounds normal; no masses, no organomegaly  Extremities: extremities normal, atraumatic, no cyanosis or edema  Skin: Skin color, texture, turgor normal. Multiple freckles over face.    Neurologic: Alert and oriented X 3, normal strength and tone. Normal symmetric reflexes. Normal coordination and gait. Right handed   Disposition:   Discharge Orders    Future Appointments: Provider: Department: Dept Phone: Center:   04/19/2011 8:30 AM [facility] [phone] [place]   04/29/2011 12:00 PM Mohamed K. Arbutus Ped, MD [facility] [phone] None     Medication List  As of 04/17/2011  6:51 PM   START taking these medications         cyclobenzaprine 5 MG tablet   Commonly known as: FLEXERIL   Take 1 tablet (5 mg total) by mouth 3 (three) times daily as needed for muscle spasms.         CONTINUE taking these medications         aspirin 81 MG tablet      FOLIC ACID PO      IRON COMBINATIONS PO      mulitivitamin with minerals Tabs      potassium chloride 10 MEQ CR capsule   Commonly known as: MICRO-K      VITAMIN C PO      VITAMIN D (CHOLECALCIFEROL) PO          Where to get your medications    These are the prescriptions that you need to pick up. We sent them to a specific pharmacy, so you will need to go there to get them.   [id number] [place], [place] - [address]    [address] [place] [address]    Phone: [phone]        cyclobenzaprine 5 MG tablet           Follow-up Information    Follow up with [facility] S, MD in 1 month.   Contact  information:   [address] [place] [address] [phone]          Signed: [name] 04/17/2011, 6:51 PM

## 2011-04-19 ENCOUNTER — Ambulatory Visit (HOSPITAL_COMMUNITY)
Admission: RE | Admit: 2011-04-19 | Discharge: 2011-04-19 | Disposition: A | Discharge status: Home / Self Care | Source: Ambulatory Visit | Attending: Internal Medicine | Admitting: Internal Medicine

## 2011-04-19 DIAGNOSIS — M47812 Spondylosis without myelopathy or radiculopathy, cervical region: Secondary | ICD-10-CM | POA: Insufficient documentation

## 2011-04-19 DIAGNOSIS — R599 Enlarged lymph nodes, unspecified: Secondary | ICD-10-CM | POA: Insufficient documentation

## 2011-04-19 DIAGNOSIS — I6529 Occlusion and stenosis of unspecified carotid artery: Secondary | ICD-10-CM | POA: Insufficient documentation

## 2011-04-19 DIAGNOSIS — J438 Other emphysema: Secondary | ICD-10-CM | POA: Insufficient documentation

## 2011-04-19 DIAGNOSIS — I319 Disease of pericardium, unspecified: Secondary | ICD-10-CM | POA: Insufficient documentation

## 2011-04-19 DIAGNOSIS — C3490 Malignant neoplasm of unspecified part of unspecified bronchus or lung: Secondary | ICD-10-CM | POA: Insufficient documentation

## 2011-04-19 DIAGNOSIS — J984 Other disorders of lung: Secondary | ICD-10-CM | POA: Insufficient documentation

## 2011-04-19 MED ORDER — IOHEXOL 300 MG/ML  SOLN
100.0000 mL | Freq: Once | INTRAMUSCULAR | Status: AC | PRN
  Administered 2011-04-19: 100 mL via INTRAVENOUS

## 2011-04-29 ENCOUNTER — Ambulatory Visit (HOSPITAL_BASED_OUTPATIENT_CLINIC_OR_DEPARTMENT_OTHER): Admitting: Internal Medicine

## 2011-04-29 VITALS — BP 124/72 | HR 103 | Temp 96.7°F | Ht 66.0 in | Wt 172.5 lb

## 2011-04-29 DIAGNOSIS — C3490 Malignant neoplasm of unspecified part of unspecified bronchus or lung: Secondary | ICD-10-CM

## 2011-04-29 DIAGNOSIS — C771 Secondary and unspecified malignant neoplasm of intrathoracic lymph nodes: Secondary | ICD-10-CM

## 2011-04-29 DIAGNOSIS — C770 Secondary and unspecified malignant neoplasm of lymph nodes of head, face and neck: Secondary | ICD-10-CM

## 2011-04-29 DIAGNOSIS — Z79899 Other long term (current) drug therapy: Secondary | ICD-10-CM

## 2011-04-29 NOTE — Progress Notes (Signed)
[facility] OFFICE PROGRESS NOTE  Kaitlin Rodriguez, MD, MD [address] [place] [address]  DIAGNOSIS: Recurrent non-small cell lung cancer initially diagnosed as stage IIA (T1 M1 MX) adenocarcinoma in April 2011.   PRIOR THERAPY:  1. Status post 3 cycles of neoadjuvant chemotherapy with carboplatin and Alimta. Last dose was given July 23, 2009. 2. Status post left lower lobectomy with lymph node dissection under the care of Dr. Edwyna Shell on October 17, 2009. 3. Status post concurrent chemoradiation with weekly carboplatin and paclitaxel for disease recurrence. Last dose was given May 07, 2010. 4. Status post 3 cycles of consolidation chemotherapy with carboplatin and Alimta. Last dose was given August 13, 2010.  CURRENT THERAPY: Observation.   INTERVAL HISTORY: Kaitlin Reed 60 y.o. female returns to the clinic today for followup visit. The patient is feeling fine today no specific complaints except for the swelling of the right neck area. She has repeat CT scan of the neck and chest performed recently and she is here today for evaluation and discussion of her scan results. She denied having any significant chest pain or shortness of breath. No weight loss or night sweats.  MEDICAL HISTORY: Past Medical History  Diagnosis Date  . Anxiety   . Hypercholesteremia   . Heart murmur   . TIA (transient ischemic attack) 04/16/11  . Tubal pregnancy   . lung ca dx'd 06/2009    chemo comp 07/2010; xrt comp 06/2010    ALLERGIES:  is allergic to codeine; motrin; other; and trazodone and nefazodone.  MEDICATIONS:  Current Outpatient Prescriptions  Medication Sig Dispense Refill  . Ascorbic Acid (VITAMIN C PO) Take by mouth daily.        Marland Kitchen aspirin 81 MG tablet Take 81 mg by mouth daily.        Marland Kitchen FOLIC ACID PO Take by mouth daily.        . IRON COMBINATIONS PO Take by mouth daily.        . Multiple Vitamin (MULITIVITAMIN WITH MINERALS) TABS Take 1 tablet by mouth daily.         . potassium chloride (MICRO-K) 10 MEQ CR capsule Take 5 mEq by mouth every other day.        Marland Kitchen VITAMIN D, CHOLECALCIFEROL, PO Take by mouth daily.          SURGICAL HISTORY:  Past Surgical History  Procedure Date  . Lung removal, partial 10/17/09    left  . 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   PHYSICAL EXAMINATION: General appearance: alert, cooperative and no distress Head: Normocephalic, without obvious abnormality, atraumatic Neck: Right cervical lymphadenpathy 3 cm in size, small right supraclavicular lymph node. Lymph nodes: Cervical adenopathy: 3cm and Supraclavicular adenopathy: <1 cm Resp: clear to auscultation bilaterally Cardio: regular rate and rhythm, S1, S2 normal, no murmur, click, rub or gallop GI: soft, non-tender; bowel sounds normal; no masses,  no organomegaly Extremities: extremities normal, atraumatic, no cyanosis or edema Neurologic: Alert and oriented X 3, normal strength and tone. Normal symmetric reflexes. Normal coordination and gait  ECOG PERFORMANCE STATUS: 0 - Asymptomatic  There were no vitals taken for this visit.  LABORATORY DATA: Lab Results  Component Value Date   WBC 4.3 04/17/2011   HGB 11.4* 04/17/2011   HCT 34.0* 04/17/2011   MCV 89.9 04/17/2011   PLT 276 04/17/2011  Chemistry      Component Value Date/Time   NA 141 04/17/2011 0242   NA 146* 03/04/2011 0806   NA 146* 03/04/2011 0806   K 3.5 04/17/2011 0242   K 3.8 03/04/2011 0806   K 3.8 03/04/2011 0806   CL 106 04/17/2011 0242   CL 102 03/04/2011 0806   CL 102 03/04/2011 0806   CO2 27 04/17/2011 0242   CO2 32 03/04/2011 0806   CO2 32 03/04/2011 0806   BUN 13 04/17/2011 0242   BUN 12 03/04/2011 0806   BUN 12 03/04/2011 0806   CREATININE 0.95 04/17/2011 0242   CREATININE 0.9 03/04/2011 0806   CREATININE 0.9 03/04/2011 0806      Component Value Date/Time   CALCIUM 9.3 04/17/2011 0242    CALCIUM 9.0 03/04/2011 0806   CALCIUM 9.0 03/04/2011 0806   ALKPHOS 151* 04/16/2011 2040   ALKPHOS 119* 03/04/2011 0806   ALKPHOS 119* 03/04/2011 0806   AST 20 04/16/2011 2040   AST 18 03/04/2011 0806   AST 18 03/04/2011 0806   ALT 6 04/16/2011 2040   BILITOT 0.3 04/16/2011 2040   BILITOT 0.40 03/04/2011 0806   BILITOT 0.40 03/04/2011 0806       RADIOGRAPHIC STUDIES: Ct Soft Tissue Neck W Contrast  04/19/2011  *RADIOLOGY REPORT*  Clinical Data: Right cervical lymphadenopathy.  CT NECK WITH CONTRAST  Technique:  Multidetector CT imaging of the neck was performed with intravenous contrast.  Contrast: OMNIPAQUE IOHEXOL 300 MG/ML IV SOLN  Comparison: No prior CT neck studies.  CT scan most recent 02/21/2010  Findings: Suprahyoid neck:  Lymphadenopathy described below.  Major and minor salivary glands unremarkable.  No tonsillar enlargement. No mucosal lesion.  Larynx:  Normal.  Infrahyoid neck:  Normal thyroid and subglottic region.  Volume loss left hemithorax.  Lymph nodes:  19 x 26 mm level II lymph node on the right is predominately solid.  Early necrosis of a more medial right parapharyngeal 17 x 18 mm level II node displacing the carotid sheath posterolateral.  No pathologically enlarged left-sided lymph nodes.  Upper chest/mediastinum:  Centrally necrotic right pretracheal/paratracheal lymph nodes described further on CT chest measuring 21 x 27 mm; chronic volume loss left hemithorax. Vascular calcifications.  Additional:  Apparent significant narrowing of right internal carotid artery 1 cm above bifurcation (image 27 of series 6.  Flow reducing lesion not excluded. It is unclear what component is intrinsic atherosclerosis vs. extrinsic mass effect from the adjacent lymph node.  CTA neck or carotid Dopplers could provide additional information.  Nonstenotic atheromatous change left carotid bifurcation.  Advanced spondylosis C5-6 and C6-7 without osseous destructive lesion.  Visualized  intracranial compartment unremarkable.  IMPRESSION: Bulky right cervical lymphadenopathy, level II, likely represents metastatic disease.  No head/neck primary is observed.  Correlate clinically.  Although not performed specifically as a vascular CTA neck study, there is concern for flow reducing lesion of the right internal carotid artery.  Carotid Dopplers or CTA neck recommended for further evaluation.  Original Report Authenticated By: Elsie Stain, M.D.   Ct Chest W Contrast  04/19/2011  *RADIOLOGY REPORT*  Clinical Data: Restaging lung cancer  CT CHEST WITH CONTRAST  Technique:  Multidetector CT imaging of the chest was performed following the standard protocol during bolus administration of intravenous contrast.  Contrast: OMNIPAQUE IOHEXOL 300 MG/ML IV SOLN 03/04/2011  Comparison: 03/04/2011  Findings: No enlarged axillary or supraclavicular adenopathy.  Right paratracheal lymph node measures 1.9 cm, image 18. Previously 1.8 cm.  Precarinal lymph node measures 1.1 cm, image 23.  Previously 0.6 cm.  Similar sized pericardial effusion.  Similar appearance of partially loculated left pleural effusion.  Post lung changes identified on the left.  Consolidation and fibrosis and scarring is noted within the left midlung.  Previously referenced anterior medial right upper lobe nodule measures 0.6 cm, image 23.  Previously 0.5 cm.  Advanced emphysema.  Review of the visualized osseous structures is significant for mild multilevel spondylosis.  No worrisome lytic or sclerotic lesions identified.  Limited imaging through the upper abdomen is unremarkable.  The adrenal glands both appear normal.  IMPRESSION:  1.  Enlarged right paratracheal and precarinal lymph nodes worrisome for residual/recurrence of tumor. 2.  Stable small pericardial effusion and postop changes involving the left lung.  Original Report Authenticated By: Rosealee Albee, M.D.   Mr Angiogram Head Wo Contrast  04/17/2011  *RADIOLOGY  REPORT*  Clinical Data:  Left sided numbness.  MRI HEAD WITHOUT CONTRAST MRA HEAD WITHOUT CONTRAST  Technique: Multiplanar, multiecho pulse sequences of the brain and surrounding structures were obtained according to standard protocol without intravenous contrast.  Angiographic images of the head were obtained using MRA technique without contrast.  Comparison: 03/27/2011 CT.  07/18/2009 MR.  MRI HEAD  Findings:  No acute infarct.  No intracranial hemorrhage.  No intracranial mass lesion detected on this unenhanced exam.  No hydrocephalus.  Small left vertebral artery.  Please see above.  Minimal paranasal sinus mucosal thickening.  IMPRESSION: No acute infarct.  MRA HEAD  Findings: Anterior circulation without medium or large size vessel significant stenosis or occlusion.  Just beyond the first branch of the left middle cerebral artery there is a tiny bulge which appears to be origin of a vessel rather than a tiny aneurysm.  Left vertebral artery ends in a PICA distribution.  Mild to moderate tandem stenosis involving portions of the left vertebral artery and left PICA.  Ectatic right vertebral artery without high-grade stenosis.  Mild ectasia of the basilar artery without high-grade stenosis.  Nonvisualization AICAs.  Mild branch vessel irregularity.  IMPRESSION: Mild to moderate narrowing of portions of the left vertebral artery and left PICA.  The left vertebral artery ends in a PICA distribution.  Please see above.  Original Report Authenticated By: Fuller Canada, M.D.   Mr Brain Wo Contrast  04/17/2011  *RADIOLOGY REPORT*  Clinical Data:  Left sided numbness.  MRI HEAD WITHOUT CONTRAST MRA HEAD WITHOUT CONTRAST  Technique: Multiplanar, multiecho pulse sequences of the brain and surrounding structures were obtained according to standard protocol without intravenous contrast.  Angiographic images of the head were obtained using MRA technique without contrast.  Comparison: 03/27/2011 CT.  07/18/2009 MR.  MRI  HEAD  Findings:  No acute infarct.  No intracranial hemorrhage.  No intracranial mass lesion detected on this unenhanced exam.  No hydrocephalus.  Small left vertebral artery.  Please see above.  Minimal paranasal sinus mucosal thickening.  IMPRESSION: No acute infarct.  MRA HEAD  Findings: Anterior circulation without medium or large size vessel significant stenosis or occlusion.  Just beyond the first branch of the left middle cerebral artery there is a tiny bulge which appears to be origin of a vessel rather than a tiny aneurysm.  Left vertebral artery ends in a PICA distribution.  Mild to moderate tandem stenosis involving portions of the left vertebral artery and left PICA.  Ectatic right vertebral artery without high-grade stenosis.  Mild ectasia of the basilar artery without high-grade  stenosis.  Nonvisualization AICAs.  Mild branch vessel irregularity.  IMPRESSION: Mild to moderate narrowing of portions of the left vertebral artery and left PICA.  The left vertebral artery ends in a PICA distribution.  Please see above.  Original Report Authenticated By: Fuller Canada, M.D.   Nm Myocar Multi W/spect W/wall Motion / Ef  04/17/2011  *RADIOLOGY REPORT*  Clinical Data:  Chest pain.  MYOCARDIAL IMAGING WITH SPECT (REST AND PHARMACOLOGIC-STRESS) GATED LEFT VENTRICULAR WALL MOTION STUDY LEFT VENTRICULAR EJECTION FRACTION  Technique:  Standard myocardial SPECT imaging was performed after resting intravenous injection of 10 mCi Tc-57m tetrofosmin. Subsequently, intravenous infusion of regadenoson was performed under the supervision of the Cardiology staff.  At peak effect of the drug, 30 mCi Tc-72m tetrofosmin was injected intravenously and standard myocardial SPECT  imaging was performed.  Quantitative gated imaging was also performed to evaluate left ventricular wall motion, and estimate left ventricular ejection fraction.  Comparison:  CT chest 03/04/2011.  Findings: No fixed or reversible perfusion defect  is identified. End-diastolic volume is 51 ml and end-systolic volume is 12 ml. Estimated ejection fraction is 76%. Wall motion appears normal.  IMPRESSION: Normal study.  Original Report Authenticated By: Bernadene Bell. D'ALESSIO, M.D.    ASSESSMENT: This is a very pleasant 60 years old African American female with recurrent non-small cell lung cancer. She has evidence for disease progression in the neck and mediastinal lymph nodes. I discussed the scan results with the patient.   PLAN: I recommended for her to have a PET scan performed for staging workup of her disease. I referred the patient to Dr. Michell Heinrich for consideration of palliative radiotherapy to the right cervical lymph nodes. I would see the patient back for followup visit in one week for evaluation and discussion of her systemic chemotherapy which most likely will be in the form of Taxotere 75 mg/M2 every 3 weeks with Neulasta support. The patient agreed to the current plan.   All questions were answered. The patient knows to call the clinic with any problems, questions or concerns. We can certainly see the patient much sooner if necessary.

## 2011-05-03 ENCOUNTER — Other Ambulatory Visit: Payer: Self-pay | Admitting: Internal Medicine

## 2011-05-03 ENCOUNTER — Encounter (HOSPITAL_COMMUNITY)
Admission: RE | Admit: 2011-05-03 | Discharge: 2011-05-03 | Disposition: A | Discharge status: Home / Self Care | Source: Ambulatory Visit | Attending: Internal Medicine | Admitting: Internal Medicine

## 2011-05-03 DIAGNOSIS — I319 Disease of pericardium, unspecified: Secondary | ICD-10-CM | POA: Insufficient documentation

## 2011-05-03 DIAGNOSIS — C3490 Malignant neoplasm of unspecified part of unspecified bronchus or lung: Secondary | ICD-10-CM

## 2011-05-03 DIAGNOSIS — J438 Other emphysema: Secondary | ICD-10-CM | POA: Insufficient documentation

## 2011-05-03 LAB — GLUCOSE, CAPILLARY: Glucose-Capillary: 93 mg/dL (ref 70–99)

## 2011-05-03 MED ORDER — FLUDEOXYGLUCOSE F - 18 (FDG) INJECTION: 17.4000 | Freq: Once | INTRAVENOUS | Status: AC | PRN

## 2011-05-06 ENCOUNTER — Other Ambulatory Visit (HOSPITAL_BASED_OUTPATIENT_CLINIC_OR_DEPARTMENT_OTHER): Admitting: Lab

## 2011-05-06 ENCOUNTER — Ambulatory Visit (HOSPITAL_BASED_OUTPATIENT_CLINIC_OR_DEPARTMENT_OTHER): Admitting: Internal Medicine

## 2011-05-06 ENCOUNTER — Encounter: Payer: Self-pay | Admitting: *Deleted

## 2011-05-06 VITALS — BP 106/63 | HR 93 | Temp 98.1°F | Ht 66.0 in | Wt 169.2 lb

## 2011-05-06 DIAGNOSIS — C7951 Secondary malignant neoplasm of bone: Secondary | ICD-10-CM

## 2011-05-06 DIAGNOSIS — R599 Enlarged lymph nodes, unspecified: Secondary | ICD-10-CM

## 2011-05-06 DIAGNOSIS — C3490 Malignant neoplasm of unspecified part of unspecified bronchus or lung: Secondary | ICD-10-CM

## 2011-05-06 DIAGNOSIS — F411 Generalized anxiety disorder: Secondary | ICD-10-CM

## 2011-05-06 LAB — COMPREHENSIVE METABOLIC PANEL
AST: 18 U/L (ref 0–37)
Albumin: 4.3 g/dL (ref 3.5–5.2)
BUN: 12 mg/dL (ref 6–23)
CO2: 27 mEq/L (ref 19–32)
Calcium: 9.4 mg/dL (ref 8.4–10.5)
Chloride: 105 mEq/L (ref 96–112)
Glucose, Bld: 121 mg/dL — ABNORMAL HIGH (ref 70–99)
Potassium: 4.6 mEq/L (ref 3.5–5.3)

## 2011-05-06 LAB — CBC WITH DIFFERENTIAL/PLATELET
Basophils Absolute: 0 10*3/uL (ref 0.0–0.1)
EOS%: 0.8 % (ref 0.0–7.0)
Eosinophils Absolute: 0 10*3/uL (ref 0.0–0.5)
HCT: 36.7 % (ref 34.8–46.6)
HGB: 12.5 g/dL (ref 11.6–15.9)
MONO#: 0.3 10*3/uL (ref 0.1–0.9)
NEUT#: 2.2 10*3/uL (ref 1.5–6.5)
RDW: 12.6 % (ref 11.2–14.5)
WBC: 3.7 10*3/uL — ABNORMAL LOW (ref 3.9–10.3)
lymph#: 1.1 10*3/uL (ref 0.9–3.3)

## 2011-05-06 NOTE — Progress Notes (Signed)
[facility] OFFICE PROGRESS NOTE  [name], MD, MD [address] [place] [address]  DIAGNOSIS: Recurrent non-small cell lung cancer initially diagnosed as stage IIA (T1 M1 MX) adenocarcinoma in April 2011.   PRIOR THERAPY:  1. Status post 3 cycles of neoadjuvant chemotherapy with carboplatin and Alimta. Last dose was given July 23, 2009. 2. Status post left lower lobectomy with lymph node dissection under the care of Dr. Edwyna Shell on October 17, 2009. 3. Status post concurrent chemoradiation with weekly carboplatin and paclitaxel for disease recurrence. Last dose was given May 07, 2010. 4. Status post 3 cycles of consolidation chemotherapy with carboplatin and Alimta. Last dose was given August 13, 2010.  CURRENT THERAPY: Observation.   INTERVAL HISTORY: Kaitlin Reed 61 y.o. female returns to the clinic today for followup visit. The patient was found on recent CT scan of the neck and chest to have some evidence for disease progression. I ordered a PET scan which was performed recently and she is here today for evaluation and discussion of her scan results and recommendation regarding her treatment. She is feeling fine today except for the swelling in the right side of her neck. She is scheduled to see Dr. Michell Heinrich on 05/08/2011 for consideration of palliative radiotherapy to the right neck area.   MEDICAL HISTORY: Past Medical History  Diagnosis Date  . Anxiety   . Hypercholesteremia   . Heart murmur   . TIA (transient ischemic attack) 04/16/11  . Tubal pregnancy   . lung ca dx'd 06/2009    chemo comp 07/2010; xrt comp 06/2010    ALLERGIES:  is allergic to codeine; motrin; other; trazodone and nefazodone; banana; and tape.  MEDICATIONS:  Current Outpatient Prescriptions  Medication Sig Dispense Refill  . Ascorbic Acid (VITAMIN C PO) Take by mouth daily.        Marland Kitchen aspirin 81 MG tablet Take 81 mg by mouth daily.        Marland Kitchen FOLIC ACID PO Take by mouth  daily.        . IRON COMBINATIONS PO Take by mouth daily.        . Multiple Vitamin (MULITIVITAMIN WITH MINERALS) TABS Take 1 tablet by mouth daily.        . potassium chloride (MICRO-K) 10 MEQ CR capsule Take 5 mEq by mouth every other day.        Marland Kitchen VITAMIN D, CHOLECALCIFEROL, PO Take by mouth daily.          SURGICAL HISTORY:  Past Surgical History  Procedure Date  . Lung removal, partial 10/17/09    left  . 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; for milk tumor"    REVIEW OF SYSTEMS:  A comprehensive review of systems was negative except for: Constitutional: positive for anxious Neurological: positive for anxiety   PHYSICAL EXAMINATION: General appearance: alert, cooperative and no distress Head: Normocephalic, without obvious abnormality, atraumatic Neck: moderate anterior cervical adenopathy Lymph nodes: Cervical adenopathy: right cervical and Supraclavicular adenopathy: right Resp: clear to auscultation bilaterally Cardio: regular rate and rhythm, S1, S2 normal, no murmur, click, rub or gallop GI: soft, non-tender; bowel sounds normal; no masses,  no organomegaly Extremities: extremities normal, atraumatic, no cyanosis or edema Neurologic: Alert and oriented X 3, normal strength and tone. Normal symmetric reflexes. Normal coordination and gait  ECOG PERFORMANCE STATUS: 1 - Symptomatic but completely ambulatory  Blood pressure 106/63, pulse 93, temperature 98.1 F (36.7 C), temperature source Oral, height 5'  6" (1.676 m), weight 169 lb 3.2 oz (76.749 kg).  LABORATORY DATA: Lab Results  Component Value Date   WBC 3.7* 05/06/2011   HGB 12.5 05/06/2011   HCT 36.7 05/06/2011   MCV 91.6 05/06/2011   PLT 324 05/06/2011      Chemistry      Component Value Date/Time   NA 141 04/17/2011 0242   NA 146* 03/04/2011 0806   NA 146* 03/04/2011 0806   K 3.5 04/17/2011 0242   K 3.8 03/04/2011 0806   K 3.8 03/04/2011 0806   CL 106 04/17/2011 0242   CL 102 03/04/2011 0806   CL 102 03/04/2011 0806     CO2 27 04/17/2011 0242   CO2 32 03/04/2011 0806   CO2 32 03/04/2011 0806   BUN 13 04/17/2011 0242   BUN 12 03/04/2011 0806   BUN 12 03/04/2011 0806   CREATININE 0.95 04/17/2011 0242   CREATININE 0.9 03/04/2011 0806   CREATININE 0.9 03/04/2011 0806      Component Value Date/Time   CALCIUM 9.3 04/17/2011 0242   CALCIUM 9.0 03/04/2011 0806   CALCIUM 9.0 03/04/2011 0806   ALKPHOS 151* 04/16/2011 2040   ALKPHOS 119* 03/04/2011 0806   ALKPHOS 119* 03/04/2011 0806   AST 20 04/16/2011 2040   AST 18 03/04/2011 0806   AST 18 03/04/2011 0806   ALT 6 04/16/2011 2040   BILITOT 0.3 04/16/2011 2040   BILITOT 0.40 03/04/2011 0806   BILITOT 0.40 03/04/2011 0806       RADIOGRAPHIC STUDIES: Mr Brain Wo Contrast  04/17/2011  *RADIOLOGY REPORT*  Clinical Data:  Left sided numbness.  MRI HEAD WITHOUT CONTRAST MRA HEAD WITHOUT CONTRAST  Technique: Multiplanar, multiecho pulse sequences of the brain and surrounding structures were obtained according to standard protocol without intravenous contrast.  Angiographic images of the head were obtained using MRA technique without contrast.  Comparison: 03/27/2011 CT.  07/18/2009 MR.  MRI HEAD  Findings:  No acute infarct.  No intracranial hemorrhage.  No intracranial mass lesion detected on this unenhanced exam.  No hydrocephalus.  Small left vertebral artery.  Please see above.  Minimal paranasal sinus mucosal thickening.  IMPRESSION: No acute infarct.  MRA HEAD  Findings: Anterior circulation without medium or large size vessel significant stenosis or occlusion.  Just beyond the first branch of the left middle cerebral artery there is a tiny bulge which appears to be origin of a vessel rather than a tiny aneurysm.  Left vertebral artery ends in a PICA distribution.  Mild to moderate tandem stenosis involving portions of the left vertebral artery and left PICA.  Ectatic right vertebral artery without high-grade stenosis.  Mild ectasia of the basilar artery without high-grade  stenosis.  Nonvisualization AICAs.  Mild branch vessel irregularity.  IMPRESSION: Mild to moderate narrowing of portions of the left vertebral artery and left PICA.  The left vertebral artery ends in a PICA distribution.  Please see above.  Original Report Authenticated By: Fuller Canada, M.D.   Nm Myocar Multi W/spect W/wall Motion / Ef  04/17/2011  *RADIOLOGY REPORT*  Clinical Data:  Chest pain.  MYOCARDIAL IMAGING WITH SPECT (REST AND PHARMACOLOGIC-STRESS) GATED LEFT VENTRICULAR WALL MOTION STUDY LEFT VENTRICULAR EJECTION FRACTION  Technique:  Standard myocardial SPECT imaging was performed after resting intravenous injection of 10 mCi Tc-75m tetrofosmin. Subsequently, intravenous infusion of regadenoson was performed under the supervision of the Cardiology staff.  At peak effect of the drug, 30 mCi Tc-22m tetrofosmin was injected intravenously and standard myocardial  SPECT  imaging was performed.  Quantitative gated imaging was also performed to evaluate left ventricular wall motion, and estimate left ventricular ejection fraction.  Comparison:  CT chest 03/04/2011.  Findings: No fixed or reversible perfusion defect is identified. End-diastolic volume is 51 ml and end-systolic volume is 12 ml. Estimated ejection fraction is 76%. Wall motion appears normal.  IMPRESSION: Normal study.  Original Report Authenticated By: Bernadene Bell. Maricela Curet, M.D.   Nm Pet Image Restag (ps) Skull Base To Thigh  05/03/2011  *RADIOLOGY REPORT*  Clinical Data:  Non-small cell lung cancer. Subsequent treatment strategy.  NUCLEAR MEDICINE PET/CT  Technique:  17.4 mCi F-18 FDG was injected intravenously.  Full- ring PET imaging was performed from the skull base through the mid- thighs.  CT data was obtained and used for attenuation correction and anatomic localization only.  (This was not acquired as a diagnostic CT examination.)  Data regarding site of injection, patient weight, post injection waiting period, and fasting blood  sugar is available in the EPIC documentation.  Comparison: 02/21/2010  Findings: Somewhat conglomerate right internal jugular and posterior cervical triangle adenopathy is present at the C2-3 level and extending posterior to the right mandible.  Maximum standard uptake value is 20.6, and these nodes measure up to 1.5 cm in short axis.  A new right supraclavicular node has a short axis diameter of 1.1 cm and a maximum standard uptake value of 11.1, compatible with malignancy.  A new right upper paratracheal node has a short axis diameter of 2.2 cm and a maximum standard uptake value of 17.0, compatible with malignancy.  The previously hypermetabolic activity in the paraesophageal region and AP window is no longer hypermetabolic.  A moderate pericardial effusion is increased in size.  A new lytic left iliac bone lesion with a small amount of sclerosis centrally is hypermetabolic, with a maximum standard uptake value of 8.1, compatible with osseous metastatic disease.  A small focus of hypermetabolic activity along the margin of the right adrenal gland is not accompanied by a visible adrenal mass, but has a maximum standard uptake value of 6.1, concerning for early adrenal metastatic lesion.  Emphysema noted with volume loss and atelectasis in the left lower lung.  The appendix appears normal.  There is sigmoid diverticulosis.  IMPRESSION:  1.  New hypermetabolic metastatic disease along the right internal jugular and posterior cervical triangle chains in the neck; the right supraclavicular nodes; new right upper paratracheal node; left iliac bone; and likely in the right adrenal gland. The appearance is compatible with progression of disease. 2.  The small paraesophageal foci of hypermetabolic activity shown on the prior exam have resolved. 3.  Emphysema. 4.  Moderate pericardial effusion, increased. 5.  Left lung basilar volume loss/atelectasis.  Original Report Authenticated By: Dellia Cloud, M.D.     ASSESSMENT: This is a very pleasant 61 years old African American female with recurrent and now metastatic non-small cell lung cancer, adenocarcinoma, presenting with right cervical, supraclavicular, mediastinal lymphadenopathy as well as left iliac crest bone lesion. I have a lengthy discussion with the patient today about her scan results and showed the images. I discussed with her several treatment options.  PLAN:  #1 I would consider the patient for second line systemic chemotherapy with single agent Taxotere 75 mg/M2 giving every 3 weeks with Neulasta support. I discussed with the patient adverse effect of this treatment including but not limited to alopecia, myelosuppression, peripheral neuropathy, liver or renal dysfunction as well as nausea and vomiting.  The patient agreed to the treatment plan. She is expected to start the first cycle later this week or early next week. #2 the patient was referred to Dr. Michell Heinrich for consideration of palliative radiotherapy to the right cervical area. #3 the patient was very anxious and distressed today about her new disease recurrence. I asked the social worker at the cancer Center to meet with the patient for emotional support. #4 the patient will come back for followup visit in 3 weeks with the start of cycle #2. I advised the patient to call me immediately if she has any concerning symptoms in the interval.   All questions were answered. The patient knows to call the clinic with any problems, questions or concerns. We can certainly see the patient much sooner if necessary.  I spent 20 minutes counseling the patient face to face. The total time spent in the appointment was 40 minutes.

## 2011-05-06 NOTE — Progress Notes (Signed)
Clinical Social Work received referral by Dr. Arbutus Ped to provide emotional support.  Clinical Social Worker met with patient in exam room today after MD appointment.  Clinical Social Worker facilitated patient's processing of issues associated with her disease progression, specifically, sharing this information with her family members. The patient shares that she has a very large family and they will "worry her to death". Patient states "I feel good right now" and "I think everything is going to be OK".  Clinical Social Worker and patient explored consequences of not sharing with patient's son at this time. Pt states, "he may be upset, but he will have to get over it, I need to do what's right for me right now".  Clinical Social Worker offered alternative suggestions at this time such as processing emotions with a close friend.  Pt agrees to share this information with her close friend/boss and is open to meeting with Clinical Social Worker as needed. Pt agrees to share with her son after second chemotherapy cycle or at any physical sign of distress. Kaitlin Reed states she relies heavily on her faith and her church community as well as very close friends.  Kaitlin Reed, MSW, [facility] Clinical Social Worker [facility] [phone]

## 2011-05-07 ENCOUNTER — Other Ambulatory Visit: Payer: Self-pay

## 2011-05-07 ENCOUNTER — Telehealth: Payer: Self-pay | Admitting: Internal Medicine

## 2011-05-07 NOTE — Telephone Encounter (Signed)
l/m for pt to start tx on 1/14 w/ lab,1/15 inj,2/4 lab md tx,2/5 inj.  Could not start pt on 1/10 this is why delayed.  will print for pt at rad onc appt   aom

## 2011-05-08 ENCOUNTER — Encounter: Payer: Self-pay | Admitting: Radiation Oncology

## 2011-05-08 ENCOUNTER — Ambulatory Visit
Admission: RE | Admit: 2011-05-08 | Discharge: 2011-05-08 | Disposition: A | Discharge status: Home / Self Care | Source: Ambulatory Visit | Attending: Radiation Oncology | Admitting: Radiation Oncology

## 2011-05-08 DIAGNOSIS — C7952 Secondary malignant neoplasm of bone marrow: Secondary | ICD-10-CM | POA: Insufficient documentation

## 2011-05-08 DIAGNOSIS — K1231 Oral mucositis (ulcerative) due to antineoplastic therapy: Secondary | ICD-10-CM | POA: Insufficient documentation

## 2011-05-08 DIAGNOSIS — C3430 Malignant neoplasm of lower lobe, unspecified bronchus or lung: Secondary | ICD-10-CM

## 2011-05-08 DIAGNOSIS — C7951 Secondary malignant neoplasm of bone: Secondary | ICD-10-CM | POA: Insufficient documentation

## 2011-05-08 DIAGNOSIS — C770 Secondary and unspecified malignant neoplasm of lymph nodes of head, face and neck: Secondary | ICD-10-CM

## 2011-05-08 DIAGNOSIS — C3490 Malignant neoplasm of unspecified part of unspecified bronchus or lung: Secondary | ICD-10-CM | POA: Insufficient documentation

## 2011-05-08 DIAGNOSIS — R599 Enlarged lymph nodes, unspecified: Secondary | ICD-10-CM | POA: Insufficient documentation

## 2011-05-08 DIAGNOSIS — Z510 Encounter for antineoplastic radiation therapy: Secondary | ICD-10-CM | POA: Insufficient documentation

## 2011-05-08 NOTE — Progress Notes (Signed)
Please see the Nurse Progress Note in the MD Initial Consult Encounter for this patient. 
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
## 2011-05-10 ENCOUNTER — Other Ambulatory Visit: Payer: Self-pay | Admitting: Internal Medicine

## 2011-05-10 ENCOUNTER — Ambulatory Visit: Payer: Self-pay

## 2011-05-10 DIAGNOSIS — C3490 Malignant neoplasm of unspecified part of unspecified bronchus or lung: Secondary | ICD-10-CM

## 2011-05-11 ENCOUNTER — Other Ambulatory Visit: Payer: Self-pay | Admitting: Internal Medicine

## 2011-05-13 ENCOUNTER — Ambulatory Visit: Payer: Self-pay

## 2011-05-13 ENCOUNTER — Ambulatory Visit (HOSPITAL_BASED_OUTPATIENT_CLINIC_OR_DEPARTMENT_OTHER)

## 2011-05-13 ENCOUNTER — Other Ambulatory Visit: Payer: Self-pay | Admitting: *Deleted

## 2011-05-13 ENCOUNTER — Other Ambulatory Visit: Admitting: Lab

## 2011-05-13 VITALS — BP 124/69 | HR 86 | Temp 98.8°F

## 2011-05-13 DIAGNOSIS — C3490 Malignant neoplasm of unspecified part of unspecified bronchus or lung: Secondary | ICD-10-CM

## 2011-05-13 LAB — CBC WITH DIFFERENTIAL/PLATELET
BASO%: 0.1 % (ref 0.0–2.0)
Basophils Absolute: 0 10*3/uL (ref 0.0–0.1)
EOS%: 0 % (ref 0.0–7.0)
HGB: 12.5 g/dL (ref 11.6–15.9)
MCH: 30.4 pg (ref 25.1–34.0)
RDW: 12.3 % (ref 11.2–14.5)
WBC: 12.3 10*3/uL — ABNORMAL HIGH (ref 3.9–10.3)
lymph#: 0.7 10*3/uL — ABNORMAL LOW (ref 0.9–3.3)

## 2011-05-13 LAB — BASIC METABOLIC PANEL
BUN: 20 mg/dL (ref 6–23)
CO2: 21 mEq/L (ref 19–32)
Calcium: 9.8 mg/dL (ref 8.4–10.5)
Creatinine, Ser: 0.94 mg/dL (ref 0.50–1.10)
Glucose, Bld: 129 mg/dL — ABNORMAL HIGH (ref 70–99)
Sodium: 139 mEq/L (ref 135–145)

## 2011-05-13 MED ORDER — DOCETAXEL CHEMO INJECTION 160 MG/16ML
75.0000 mg/m2 | Freq: Once | INTRAVENOUS | Status: AC
  Administered 2011-05-13: 140 mg via INTRAVENOUS
  Filled 2011-05-13: qty 14

## 2011-05-13 MED ORDER — DEXAMETHASONE SODIUM PHOSPHATE 10 MG/ML IJ SOLN
10.0000 mg | Freq: Once | INTRAMUSCULAR | Status: AC
  Administered 2011-05-13: 10 mg via INTRAVENOUS

## 2011-05-13 MED ORDER — SODIUM CHLORIDE 0.9 % IV SOLN
Freq: Once | INTRAVENOUS | Status: AC
  Administered 2011-05-13: 11:00:00 via INTRAVENOUS

## 2011-05-13 MED ORDER — ONDANSETRON 8 MG/50ML IVPB (CHCC)
8.0000 mg | Freq: Once | INTRAVENOUS | Status: AC
  Administered 2011-05-13: 8 mg via INTRAVENOUS

## 2011-05-13 NOTE — Progress Notes (Signed)
Is requesting to have lab/AJ/Chemo and injection moved from 2/4 and 2/5 to 2/12/ and 2/13 because her grandaughter is graduating from the [employer].  Per Dr Donnald Garre, okay to move appts.  Onc tx schedle filled out.  SLJ

## 2011-05-13 NOTE — Patient Instructions (Signed)
Patient ambulatory out of clinic without complaints.  Instructed patient to call with any issues.  Informed patient that Dr. Asa Lente nurse would be contacting her to let her know if chemotherapy treatment will be able to be rescheduled due to patient being out of town.  Patient expressed understanding.

## 2011-05-14 ENCOUNTER — Ambulatory Visit
Admission: RE | Admit: 2011-05-14 | Discharge: 2011-05-14 | Disposition: A | Discharge status: Home / Self Care | Source: Ambulatory Visit | Attending: Radiation Oncology | Admitting: Radiation Oncology

## 2011-05-14 ENCOUNTER — Ambulatory Visit (HOSPITAL_BASED_OUTPATIENT_CLINIC_OR_DEPARTMENT_OTHER): Payer: Self-pay

## 2011-05-14 ENCOUNTER — Telehealth: Payer: Self-pay | Admitting: Internal Medicine

## 2011-05-14 VITALS — BP 111/67 | HR 76 | Temp 98.2°F

## 2011-05-14 DIAGNOSIS — C3430 Malignant neoplasm of lower lobe, unspecified bronchus or lung: Secondary | ICD-10-CM

## 2011-05-14 DIAGNOSIS — C3490 Malignant neoplasm of unspecified part of unspecified bronchus or lung: Secondary | ICD-10-CM

## 2011-05-14 DIAGNOSIS — C770 Secondary and unspecified malignant neoplasm of lymph nodes of head, face and neck: Secondary | ICD-10-CM

## 2011-05-14 MED ORDER — PEGFILGRASTIM INJECTION 6 MG/0.6ML
6.0000 mg | Freq: Once | SUBCUTANEOUS | Status: AC
  Administered 2011-05-14: 6 mg via SUBCUTANEOUS
  Filled 2011-05-14: qty 0.6

## 2011-05-14 NOTE — Telephone Encounter (Signed)
Asking if she can resume taking her vitamins-she stopped them before chemo because she wasn't sure she could take them simultaneously. I told her she can resume her vitamins

## 2011-05-14 NOTE — Progress Notes (Signed)
Met with patient to discuss RO billing.  Patient had no concerns today. 

## 2011-05-14 NOTE — Progress Notes (Signed)
[facility] Radiation Oncology Simulation and Treatment Planning Note   Name: Kaitlin Reed MRN: [id number]  Date: 05/14/2011  DOB: Jul 05, 1950  Status: outpatient    DIAGNOSIS: Metastatic non small cell lung cancer to the right neck.    SIDE: right   CONSENT VERIFIED: yes   SET UP: Patient is setup supine   IMMOBILIZATION: Following immobilization is used:Aquaplast Mask   NARRATIVE: The patient was brought to the CT Simulation planning suite.  Identity was confirmed.  All relevant records and images related to the planned course of therapy were reviewed.  Then, the patient was positioned in a stable reproducible clinical set-up for radiation therapy.  An aquaplast face mask was made for immobilization purposes with her head in a neutral position. CT images were obtained.  Skin markings were placed.  The CT images were loaded into the planning software where the target and avoidance structures were contoured.  The radiation prescription was entered and confirmed.   TREATMENT PLANNING NOTE:  Treatment planning then occurred. I have requested : MLC's and an isodose plan.  3D simulation will be performed.  I have requested and analyzed DVH of the spinal cord, lungs and gross tumor volume.   She has been previously treated to her lung so a special treatment procedure will be performed to ensure no overlap of the previous fields.

## 2011-05-15 ENCOUNTER — Telehealth: Payer: Self-pay | Admitting: Internal Medicine

## 2011-05-15 NOTE — Telephone Encounter (Signed)
Has a slight headache and wants to know what she can take-specifically can she take aleve. 1505- I told her to take acetaminophen or ibuprophen-she voices understanding

## 2011-05-16 IMAGING — CR DG CHEST 2V
2 series · 2 of 2 positions shown · non-contrast
Comparison: 10/07/2009

CLINICAL DATA: Pre admit for left lower lobe mass

CHEST - 2 VIEW

[view not recorded (1 of 2)]
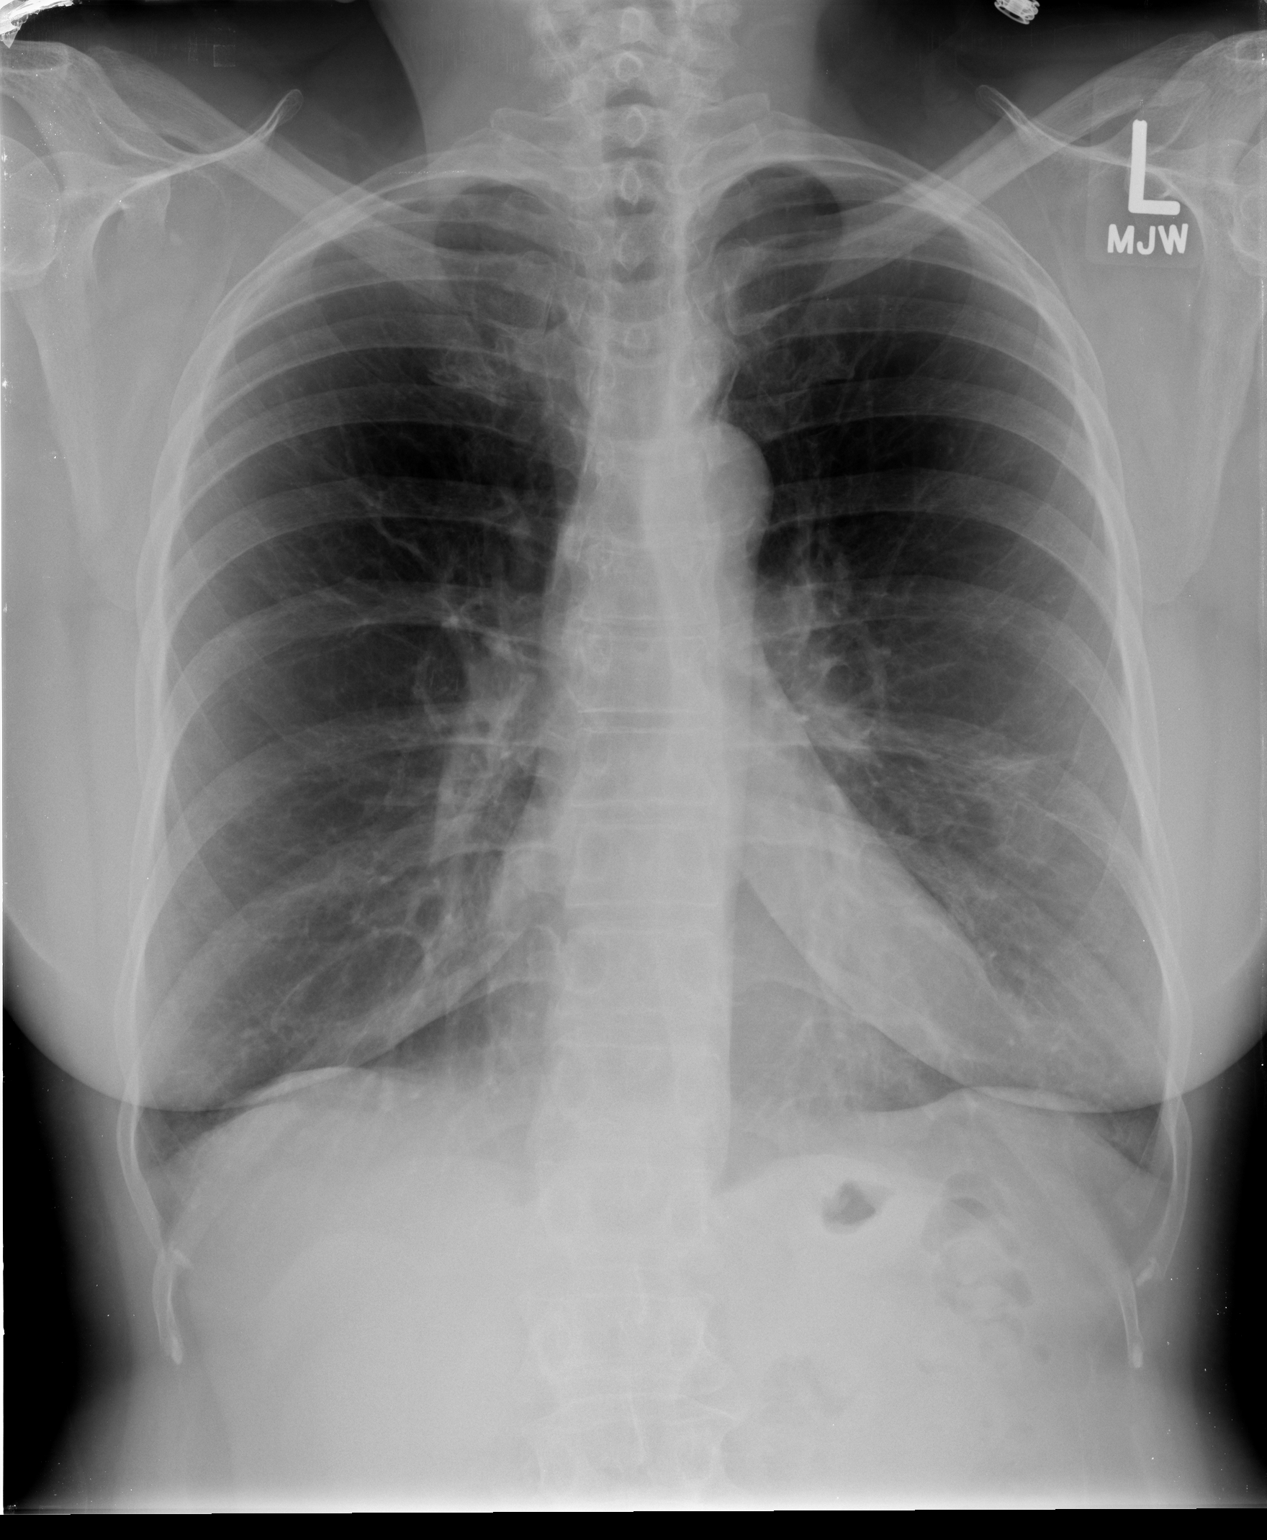

[view not recorded (2 of 2)]
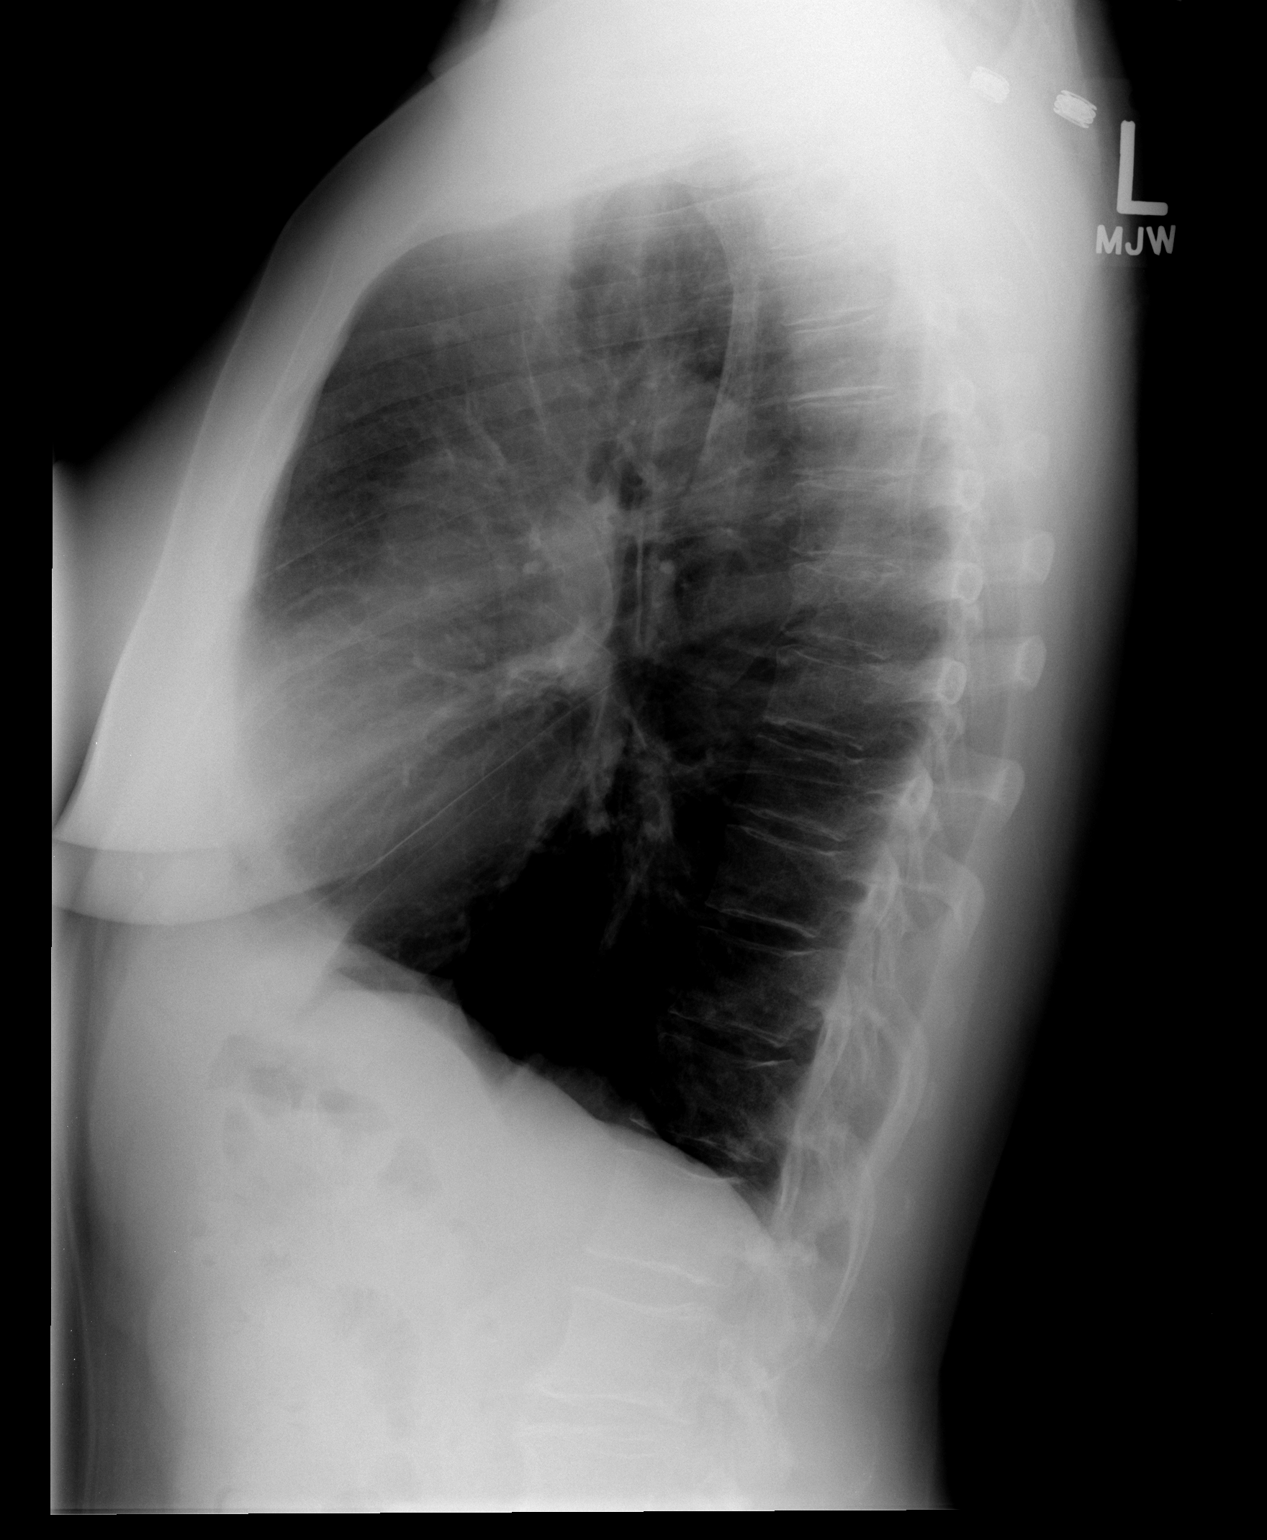

[2 of 2 positions shown; findings below may reference images not displayed]

FINDINGS: Left lower lobe mass slightly less well defined when
compared to the prior study.  No new pulmonary findings.  The lungs
are hyperaerated consistent with COPD.  Heart and mediastinal
contours normal.
IMPRESSION: 1.  Left lower lobe mass less well defined but still present.
2.  COPD.
3.  No new findings.

## 2011-05-21 ENCOUNTER — Ambulatory Visit
Admission: RE | Admit: 2011-05-21 | Discharge: 2011-05-21 | Disposition: A | Discharge status: Home / Self Care | Source: Ambulatory Visit | Attending: Radiation Oncology | Admitting: Radiation Oncology

## 2011-05-21 DIAGNOSIS — C770 Secondary and unspecified malignant neoplasm of lymph nodes of head, face and neck: Secondary | ICD-10-CM

## 2011-05-21 NOTE — Progress Notes (Signed)
[facility] Radiation Oncology Simulation Verification Note   Name: KAVYA HAAG MRN: [id number]   Date: 05/21/2011 DOB: 08-31-1950  Status:outpatient   DIAGNOSIS:  1. Metastatic lung cancer to right neck nodes.     POSITION: Patient was placed in the supine position on the treatment machine in her aquaplast mask.  Isocenter and MLCS were reviewed and treatment was approved.  NARRATIVE: Patient tolerated simulation well.

## 2011-05-22 ENCOUNTER — Ambulatory Visit
Admission: RE | Admit: 2011-05-22 | Discharge: 2011-05-22 | Disposition: A | Discharge status: Home / Self Care | Source: Ambulatory Visit | Attending: Radiation Oncology | Admitting: Radiation Oncology

## 2011-05-23 ENCOUNTER — Ambulatory Visit
Admission: RE | Admit: 2011-05-23 | Discharge: 2011-05-23 | Disposition: A | Discharge status: Home / Self Care | Source: Ambulatory Visit | Attending: Radiation Oncology | Admitting: Radiation Oncology

## 2011-05-24 ENCOUNTER — Ambulatory Visit
Admission: RE | Admit: 2011-05-24 | Discharge: 2011-05-24 | Disposition: A | Discharge status: Home / Self Care | Source: Ambulatory Visit | Attending: Radiation Oncology | Admitting: Radiation Oncology

## 2011-05-27 ENCOUNTER — Telehealth: Payer: Self-pay | Admitting: Internal Medicine

## 2011-05-27 ENCOUNTER — Ambulatory Visit
Admission: RE | Admit: 2011-05-27 | Discharge: 2011-05-27 | Disposition: A | Discharge status: Home / Self Care | Source: Ambulatory Visit | Attending: Radiation Oncology | Admitting: Radiation Oncology

## 2011-05-27 ENCOUNTER — Other Ambulatory Visit: Payer: Self-pay | Admitting: *Deleted

## 2011-05-27 NOTE — Telephone Encounter (Signed)
Per stephanie pof from 1/14 r/s lab adrena and tx from 2/4 to 2/12,done and printed pt to p/u after rad/onc  aom

## 2011-05-28 ENCOUNTER — Encounter: Payer: Self-pay | Admitting: Radiation Oncology

## 2011-05-28 ENCOUNTER — Ambulatory Visit
Admission: RE | Admit: 2011-05-28 | Discharge: 2011-05-28 | Disposition: A | Discharge status: Home / Self Care | Source: Ambulatory Visit | Attending: Radiation Oncology | Admitting: Radiation Oncology

## 2011-05-28 VITALS — BP 133/68 | HR 102 | Resp 18 | Wt 167.5 lb

## 2011-05-28 DIAGNOSIS — C770 Secondary and unspecified malignant neoplasm of lymph nodes of head, face and neck: Secondary | ICD-10-CM

## 2011-05-28 MED ORDER — BIAFINE EX EMUL
Freq: Every day | CUTANEOUS | Status: DC
  Administered 2011-05-28: 17:00:00 via TOPICAL

## 2011-05-28 NOTE — Progress Notes (Signed)
Encounter addended by: Ardell Isaacs, RN on: 05/28/2011  5:41 PM<BR>     Documentation filed: Inpatient Document Flowsheet, Inpatient Patient Education

## 2011-05-28 NOTE — Progress Notes (Signed)
Weekly Management Note Current Dose:15  Gy  Projected Dose:30  Gy   Narrative:  The patient presents for routine under treatment assessment.  CBCT/MVCT images/Port film x-rays were reviewed.  The chart was checked. Lump getting smaller. No difficulties swallowing.   Physical Findings: Weight: 167 lb 8 oz (75.978 kg). R neck mass is noticeably smaller.  Impression:  The patient is tolerating radiation.  Plan:  Continue treatment as planned.

## 2011-05-28 NOTE — Progress Notes (Signed)
Patient presents to the clinic today unaccompanied for under treat visit with Dr. Michell Heinrich and post sim education with Sam, RN. Patient is alert and oriented to person, place, and time. No distress noted. Steady gait noted. Pleasant affect noted. Patient denies pain at this time. Oriented patient to staff and routine of the clinic. Provided patient with RADIATION THERAPY AND YOU handbook then, reviewed pertinent information with the patient. Provided patient with Biafine and reviewed use. Provided patient with this writer's business card and encouraged patient to call with needs. Patient verbalized understanding of all things. Patient reports a good appetite. Patient reports nausea despite taking zofran. Patient denies diarrhea, headache, or esophagitis. Reported all findings to Dr. Michell Heinrich.

## 2011-05-29 ENCOUNTER — Telehealth: Payer: Self-pay

## 2011-05-29 ENCOUNTER — Ambulatory Visit
Admission: RE | Admit: 2011-05-29 | Discharge: 2011-05-29 | Disposition: A | Discharge status: Home / Self Care | Source: Ambulatory Visit | Attending: Radiation Oncology | Admitting: Radiation Oncology

## 2011-05-30 ENCOUNTER — Other Ambulatory Visit: Payer: Self-pay | Admitting: Lab

## 2011-05-30 ENCOUNTER — Ambulatory Visit
Admission: RE | Admit: 2011-05-30 | Discharge: 2011-05-30 | Disposition: A | Discharge status: Home / Self Care | Source: Ambulatory Visit | Attending: Radiation Oncology | Admitting: Radiation Oncology

## 2011-05-30 ENCOUNTER — Ambulatory Visit: Payer: Self-pay | Admitting: Physician Assistant

## 2011-05-31 ENCOUNTER — Ambulatory Visit: Payer: Self-pay

## 2011-05-31 ENCOUNTER — Ambulatory Visit
Admission: RE | Admit: 2011-05-31 | Discharge: 2011-05-31 | Disposition: A | Discharge status: Home / Self Care | Source: Ambulatory Visit | Attending: Radiation Oncology | Admitting: Radiation Oncology

## 2011-06-03 ENCOUNTER — Other Ambulatory Visit: Payer: Self-pay

## 2011-06-03 ENCOUNTER — Other Ambulatory Visit: Payer: Self-pay | Admitting: Radiation Oncology

## 2011-06-03 ENCOUNTER — Ambulatory Visit
Admission: RE | Admit: 2011-06-03 | Discharge: 2011-06-03 | Disposition: A | Discharge status: Home / Self Care | Source: Ambulatory Visit | Attending: Radiation Oncology | Admitting: Radiation Oncology

## 2011-06-03 ENCOUNTER — Ambulatory Visit: Payer: Self-pay

## 2011-06-03 ENCOUNTER — Ambulatory Visit: Payer: Self-pay | Admitting: Physician Assistant

## 2011-06-03 ENCOUNTER — Encounter: Payer: Self-pay | Admitting: Radiation Oncology

## 2011-06-03 ENCOUNTER — Telehealth: Payer: Self-pay | Admitting: *Deleted

## 2011-06-03 DIAGNOSIS — C770 Secondary and unspecified malignant neoplasm of lymph nodes of head, face and neck: Secondary | ICD-10-CM

## 2011-06-03 DIAGNOSIS — R1310 Dysphagia, unspecified: Secondary | ICD-10-CM

## 2011-06-03 MED ORDER — MAGIC MOUTHWASH W/LIDOCAINE: 10.0000 mL | Freq: Four times a day (QID) | ORAL | Status: DC | PRN

## 2011-06-03 MED ORDER — HYDROCODONE-ACETAMINOPHEN 7.5-500 MG/15ML PO SOLN: 7.5000 mL | ORAL | Status: AC | PRN

## 2011-06-03 MED ORDER — DOCUSATE SODIUM 250 MG PO CAPS: 250.0000 mg | ORAL_CAPSULE | Freq: Every day | ORAL | Status: AC

## 2011-06-03 NOTE — Progress Notes (Signed)
Pt here for c/o bad sore throat today.  Has used OTC meds and salt water gargle without relief.

## 2011-06-03 NOTE — Progress Notes (Signed)
   Weekly Management Note Current Dose:  2400 cGy to the right neck Projected Dose: 3000 cGy   Narrative:  The patient asked to be seen today for odynophagia. As well, her mouth is sore.  Physical Findings: Weight:  . There were no vitals filed for this visit. She has no evidence of thrush in her mouth. There is some mild mucositis in the lower gingiva.  Impression:  The patient is tolerating radiotherapy. She needs medication to alleviate her mouth and throat pain.  Plan:  Continue radiotherapy as planned. I've given her prescription for Lortab elixir as well as Colace to prevent constipation. Additionally I have given her prescription for Magic mouthwash with lidocaine.

## 2011-06-04 ENCOUNTER — Ambulatory Visit
Admission: RE | Admit: 2011-06-04 | Discharge: 2011-06-04 | Disposition: A | Discharge status: Home / Self Care | Source: Ambulatory Visit | Attending: Radiation Oncology | Admitting: Radiation Oncology

## 2011-06-04 ENCOUNTER — Ambulatory Visit: Payer: Self-pay

## 2011-06-04 ENCOUNTER — Telehealth: Payer: Self-pay | Admitting: *Deleted

## 2011-06-04 DIAGNOSIS — C770 Secondary and unspecified malignant neoplasm of lymph nodes of head, face and neck: Secondary | ICD-10-CM

## 2011-06-04 MED ORDER — LIDOCAINE HCL 2 % EX GEL: CUTANEOUS | Status: DC | PRN

## 2011-06-04 NOTE — Telephone Encounter (Signed)
Pt called stating the Lortab elixir prescribed by Dr Basilio Cairo on 06/03/11 is too expensive. Her insurance does not cover this med. She is requesting a different med for her sore throat. Pt coming in for radiation tx today at 3:40 pm. Advised her she will see Dr Michell Heinrich today, so she can request another med at that time. Pt verbalized understanding.

## 2011-06-04 NOTE — Progress Notes (Signed)
Weekly Management Note Current Dose:  30 Gy  Projected Dose: 30 Gy   Narrative:  The patient presents for routine under treatment assessment.  CBCT/MVCT images/Port film x-rays were reviewed.  The chart was checked. Some dysphagia. MMW too expensive. Taking 15 ml of lortab q 4 with some relief. Leaving for graduation on Thursday. Eating fine. No skin changes.   Physical Findings: Weight: 168 lb 6.4 oz (76.386 kg). Unchanged. Mucocitis in right oral cavity and oropharynx.   Impression:  The patient is tolerating radiation.  Plan:  Continue treatment as planned. Add viscous lidocaine and increase lortab to 1 Tablespoon.

## 2011-06-04 NOTE — Progress Notes (Signed)
Completes treatment today.  C/o Sore mouth and throat.  Redness noted back of throat.  States her gums are so sore that she can't put in her partial plate.  Reports "a little" fatigue.

## 2011-06-05 ENCOUNTER — Ambulatory Visit

## 2011-06-06 ENCOUNTER — Ambulatory Visit

## 2011-06-06 NOTE — Telephone Encounter (Signed)
No charge for this encounter.

## 2011-06-07 ENCOUNTER — Ambulatory Visit

## 2011-06-08 NOTE — Progress Notes (Signed)
DIAGNOSIS:  Metastatic non-small-cell lung cancer to lung.  TREATMENT DATES:  05/22/2011 to 06/03/2010.  ANATOMIC REGION TREATED:  Right neck.  BEAM ARRANGEMENT:  Opposed obliques.  ENERGY:  6 and 10 MV photons.  DOSE:  30 Gy at 3 Gy per fraction x10 fractions.  TREATMENT TOLERANCE:  Kaitlin Reed tolerated her treatment very well.  Even at the end of her first week of treatment her right neck adenopathy was significantly decreased.  She did have some odynophagia which we treated with Lortab.  FOLLOWUP:  I will see her back in 1 month's time.  She also has regular scheduled followup with Dr. Arbutus Ped.    ______________________________ Lurline Hare, M.D. SW/MEDQ  D:  06/08/2011  T:  06/08/2011  Job:  [id number]

## 2011-06-10 ENCOUNTER — Ambulatory Visit

## 2011-06-11 ENCOUNTER — Ambulatory Visit (HOSPITAL_BASED_OUTPATIENT_CLINIC_OR_DEPARTMENT_OTHER): Admitting: Physician Assistant

## 2011-06-11 ENCOUNTER — Ambulatory Visit (HOSPITAL_BASED_OUTPATIENT_CLINIC_OR_DEPARTMENT_OTHER)

## 2011-06-11 ENCOUNTER — Other Ambulatory Visit: Payer: Self-pay

## 2011-06-11 ENCOUNTER — Other Ambulatory Visit: Payer: Self-pay | Admitting: *Deleted

## 2011-06-11 VITALS — BP 142/73 | HR 92 | Temp 96.8°F | Ht 66.0 in | Wt 165.4 lb

## 2011-06-11 DIAGNOSIS — C801 Malignant (primary) neoplasm, unspecified: Secondary | ICD-10-CM

## 2011-06-11 DIAGNOSIS — C3490 Malignant neoplasm of unspecified part of unspecified bronchus or lung: Secondary | ICD-10-CM

## 2011-06-11 DIAGNOSIS — Z5111 Encounter for antineoplastic chemotherapy: Secondary | ICD-10-CM

## 2011-06-11 DIAGNOSIS — C770 Secondary and unspecified malignant neoplasm of lymph nodes of head, face and neck: Secondary | ICD-10-CM

## 2011-06-11 LAB — CBC WITH DIFFERENTIAL/PLATELET
Eosinophils Absolute: 0.1 10*3/uL (ref 0.0–0.5)
HCT: 33.6 % — ABNORMAL LOW (ref 34.8–46.6)
HGB: 11.5 g/dL — ABNORMAL LOW (ref 11.6–15.9)
LYMPH%: 13 % — ABNORMAL LOW (ref 14.0–49.7)
MONO#: 0.8 10*3/uL (ref 0.1–0.9)
NEUT#: 5 10*3/uL (ref 1.5–6.5)
NEUT%: 74 % (ref 38.4–76.8)
Platelets: 305 10*3/uL (ref 145–400)
WBC: 6.8 10*3/uL (ref 3.9–10.3)

## 2011-06-11 LAB — COMPREHENSIVE METABOLIC PANEL
ALT: <8 U/L (ref 0–35)
CO2: 26 mEq/L (ref 19–32)
Creatinine, Ser: 0.94 mg/dL (ref 0.50–1.10)
Glucose, Bld: 119 mg/dL — ABNORMAL HIGH (ref 70–99)
Total Bilirubin: 0.3 mg/dL (ref 0.3–1.2)

## 2011-06-11 MED ORDER — ONDANSETRON 8 MG/50ML IVPB (CHCC)
8.0000 mg | Freq: Once | INTRAVENOUS | Status: AC
  Administered 2011-06-11: 8 mg via INTRAVENOUS

## 2011-06-11 MED ORDER — DEXAMETHASONE SODIUM PHOSPHATE 10 MG/ML IJ SOLN
10.0000 mg | Freq: Once | INTRAMUSCULAR | Status: AC
  Administered 2011-06-11: 10 mg via INTRAVENOUS

## 2011-06-11 MED ORDER — PROCHLORPERAZINE MALEATE 10 MG PO TABS: 10.0000 mg | ORAL_TABLET | Freq: Four times a day (QID) | ORAL | Status: DC | PRN

## 2011-06-11 MED ORDER — DEXAMETHASONE 4 MG PO TABS: ORAL_TABLET | ORAL | Status: DC

## 2011-06-11 MED ORDER — DOCETAXEL CHEMO INJECTION 160 MG/16ML
75.0000 mg/m2 | Freq: Once | INTRAVENOUS | Status: AC
  Administered 2011-06-11: 140 mg via INTRAVENOUS
  Filled 2011-06-11: qty 14

## 2011-06-11 MED ORDER — FLUCONAZOLE 100 MG PO TABS: ORAL_TABLET | ORAL | Status: DC

## 2011-06-11 MED ORDER — SODIUM CHLORIDE 0.9 % IV SOLN
Freq: Once | INTRAVENOUS | Status: AC
  Administered 2011-06-11: 12:00:00 via INTRAVENOUS

## 2011-06-12 ENCOUNTER — Ambulatory Visit (HOSPITAL_BASED_OUTPATIENT_CLINIC_OR_DEPARTMENT_OTHER)

## 2011-06-12 DIAGNOSIS — C3490 Malignant neoplasm of unspecified part of unspecified bronchus or lung: Secondary | ICD-10-CM

## 2011-06-12 DIAGNOSIS — C801 Malignant (primary) neoplasm, unspecified: Secondary | ICD-10-CM

## 2011-06-12 DIAGNOSIS — C770 Secondary and unspecified malignant neoplasm of lymph nodes of head, face and neck: Secondary | ICD-10-CM

## 2011-06-12 DIAGNOSIS — Z5189 Encounter for other specified aftercare: Secondary | ICD-10-CM

## 2011-06-12 MED ORDER — PEGFILGRASTIM INJECTION 6 MG/0.6ML
6.0000 mg | Freq: Once | SUBCUTANEOUS | Status: AC
  Administered 2011-06-12: 6 mg via SUBCUTANEOUS
  Filled 2011-06-12: qty 0.6

## 2011-06-12 NOTE — Progress Notes (Signed)
[facility] OFFICE PROGRESS NOTE  [name], MD, MD [address] [place] [address]  DIAGNOSIS: Recurrent non-small cell lung cancer initially diagnosed as stage IIA (T1 M1 MX) adenocarcinoma in April 2011.   PRIOR THERAPY:  1. Status post 3 cycles of neoadjuvant chemotherapy with carboplatin and Alimta. Last dose was given July 23, 2009. 2. Status post left lower lobectomy with lymph node dissection under the care of Dr. Edwyna Shell on October 17, 2009. 3. Status post concurrent chemoradiation with weekly carboplatin and paclitaxel for disease recurrence. Last dose was given May 07, 2010. 4. Status post 3 cycles of consolidation chemotherapy with carboplatin and Alimta. Last dose was given August 13, 2010.  CURRENT THERAPY:  1.Taxotere 75 mg/m2 every 3 weeks,, status post 1 cycle 2. Palliative radiotherapy to the right neck area  INTERVAL HISTORY: SONALI WIVELL 61 y.o. female returns to the clinic today for followup visit. She tolerated her first cycle of chemotherapy relatively well. She does however complain of sore throat that began after she started radiation therapy. She is given Magic mouthwash with lidocaine as well as hydrocodone elixir. She's not tolerating these medications are well. These were prescribed by Dr. Michell Heinrich and she will discuss her issues with them with Dr. Michell Heinrich. She was bruised somewhat from her last blood draw. She'll so had some weight loss secondary to her difficulty swallowing. She is though able to continue to EEG relatively well. She's not had any problems with shortness of breath or cough. She did have some issues with constipation and flair up of her hemorrhoids but saw Dr. Luna Glasgow. These symptoms have resolved. She require new prescriptions for dexamethasone and her Compazine. She is been only taking 8 mg once a day of the dexamethasone with her current chemotherapy as opposed to 8 milligrams twice a day the day before the day of and  day after that she is supposed to be taking.   MEDICAL HISTORY: Past Medical History  Diagnosis Date  . Anxiety   . Hypercholesteremia   . Heart murmur   . TIA (transient ischemic attack) 04/16/11  . Tubal pregnancy   . lung ca dx'd 06/2009    chemo comp 07/2010; xrt comp 06/2010    ALLERGIES:  is allergic to codeine; motrin; other; trazodone and nefazodone; banana; and tape.  MEDICATIONS:  Current Outpatient Prescriptions  Medication Sig Dispense Refill  . Alum & Mag Hydroxide-Simeth (MAGIC MOUTHWASH W/LIDOCAINE) SOLN Take 10 mLs by mouth 4 (four) times daily as needed.  500 mL  5  . Ascorbic Acid (VITAMIN C PO) Take by mouth daily.        Marland Kitchen aspirin 81 MG tablet Take 81 mg by mouth daily.        Marland Kitchen dexamethasone (DECADRON) 4 MG tablet Take 2 tablets by mouth twice a day, the day before, the day of and the day after chemotherapy  40 tablet  1  . docusate sodium (COLACE) 250 MG capsule Take 1 capsule (250 mg total) by mouth daily.  10 capsule  0  . emollient (BIAFINE) cream Apply topically as needed.      . fluconazole (DIFLUCAN) 100 MG tablet Take 2 tablets by mouth on day one, then one table by mouth daily until completed  16 tablet  0  . FOLIC ACID PO Take by mouth daily.        Marland Kitchen HYDROcodone-acetaminophen (LORTAB) 7.5-500 MG/15ML solution Take 7.5 mLs by mouth every 4 (four) hours as needed for pain.  120 mL  0  . IRON COMBINATIONS PO Take by mouth daily.        Marland Kitchen lidocaine (XYLOCAINE JELLY) 2 % jelly Apply topically as needed.  30 mL  0  . Multiple Vitamin (MULITIVITAMIN WITH MINERALS) TABS Take 1 tablet by mouth daily.        . ondansetron (ZOFRAN) 4 MG tablet Take 4 mg by mouth every 8 (eight) hours as needed.      . potassium chloride (MICRO-K) 10 MEQ CR capsule Take 5 mEq by mouth every other day.        . prochlorperazine (COMPAZINE) 10 MG tablet Take 1 tablet (10 mg total) by mouth every 6 (six) hours as needed.  30 tablet  1  . VITAMIN D, CHOLECALCIFEROL, PO Take by mouth  daily.         No current facility-administered medications for this visit.   Facility-Administered Medications Ordered in Other Visits  Medication Dose Route Frequency Provider Last Rate Last Dose  . pegfilgrastim (NEULASTA) injection 6 mg  6 mg Subcutaneous Once Tiana Loft, PA   6 mg at 06/12/11 1419    SURGICAL HISTORY:  Past Surgical History  Procedure Date  . Lung removal, partial 10/17/09    left  . 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; for milk tumor"    REVIEW OF SYSTEMS:  A comprehensive review of systems was negative except for: Ears, nose, mouth, throat, and face: positive for sore throat Gastrointestinal: positive for constipation Neurological: positive for anxiety   PHYSICAL EXAMINATION: General appearance: alert, cooperative and no distress Head: Normocephalic, without obvious abnormality, atraumatic Neck: moderate anterior cervical adenopathy Lymph nodes: Cervical adenopathy: right cervical and Supraclavicular adenopathy: right Resp: clear to auscultation bilaterally Cardio: regular rate and rhythm, S1, S2 normal, no murmur, click, rub or gallop GI: soft, non-tender; bowel sounds normal; no masses,  no organomegaly Extremities: extremities normal, atraumatic, no cyanosis or edema Neurologic: Alert and oriented X 3, normal strength and tone. Normal symmetric reflexes. Normal coordination and gait Mouth: reveals thrush  ECOG PERFORMANCE STATUS: 1 - Symptomatic but completely ambulatory  Blood pressure 142/73, pulse 92, temperature 96.8 F (36 C), temperature source Oral, height 5\' 6"  (1.676 m), weight 165 lb 6.4 oz (75.025 kg).  LABORATORY DATA: Lab Results  Component Value Date   WBC 6.8 06/11/2011   HGB 11.5* 06/11/2011   HCT 33.6* 06/11/2011   MCV 88.7 06/11/2011   PLT 305 06/11/2011      Chemistry      Component Value Date/Time   NA 142 06/11/2011 1032   NA 146* 03/04/2011 0806   NA 146* 03/04/2011 0806   K 4.1 06/11/2011 1032   K 3.8 03/04/2011 0806   K  3.8 03/04/2011 0806   CL 105 06/11/2011 1032   CL 102 03/04/2011 0806   CL 102 03/04/2011 0806   CO2 26 06/11/2011 1032   CO2 32 03/04/2011 0806   CO2 32 03/04/2011 0806   BUN 14 06/11/2011 1032   BUN 12 03/04/2011 0806   BUN 12 03/04/2011 0806   CREATININE 0.94 06/11/2011 1032   CREATININE 0.9 03/04/2011 0806   CREATININE 0.9 03/04/2011 0806      Component Value Date/Time   CALCIUM 10.0 06/11/2011 1032   CALCIUM 9.0 03/04/2011 0806   CALCIUM 9.0 03/04/2011 0806   ALKPHOS 142* 06/11/2011 1032   ALKPHOS 119* 03/04/2011 0806   ALKPHOS 119* 03/04/2011 0806   AST 18 06/11/2011 1032   AST 18 03/04/2011  0806   AST 18 03/04/2011 0806   ALT <8 06/11/2011 1032   BILITOT 0.3 06/11/2011 1032   BILITOT 0.40 03/04/2011 0806   BILITOT 0.40 03/04/2011 0806       RADIOGRAPHIC STUDIES: Mr Brain Wo Contrast  04/17/2011  *RADIOLOGY REPORT*  Clinical Data:  Left sided numbness.  MRI HEAD WITHOUT CONTRAST MRA HEAD WITHOUT CONTRAST  Technique: Multiplanar, multiecho pulse sequences of the brain and surrounding structures were obtained according to standard protocol without intravenous contrast.  Angiographic images of the head were obtained using MRA technique without contrast.  Comparison: 03/27/2011 CT.  07/18/2009 MR.  MRI HEAD  Findings:  No acute infarct.  No intracranial hemorrhage.  No intracranial mass lesion detected on this unenhanced exam.  No hydrocephalus.  Small left vertebral artery.  Please see above.  Minimal paranasal sinus mucosal thickening.  IMPRESSION: No acute infarct.  MRA HEAD  Findings: Anterior circulation without medium or large size vessel significant stenosis or occlusion.  Just beyond the first branch of the left middle cerebral artery there is a tiny bulge which appears to be origin of a vessel rather than a tiny aneurysm.  Left vertebral artery ends in a PICA distribution.  Mild to moderate tandem stenosis involving portions of the left vertebral artery and left PICA.  Ectatic right vertebral  artery without high-grade stenosis.  Mild ectasia of the basilar artery without high-grade stenosis.  Nonvisualization AICAs.  Mild branch vessel irregularity.  IMPRESSION: Mild to moderate narrowing of portions of the left vertebral artery and left PICA.  The left vertebral artery ends in a PICA distribution.  Please see above.  Original Report Authenticated By: Fuller Canada, M.D.   Nm Myocar Multi W/spect W/wall Motion / Ef  04/17/2011  *RADIOLOGY REPORT*  Clinical Data:  Chest pain.  MYOCARDIAL IMAGING WITH SPECT (REST AND PHARMACOLOGIC-STRESS) GATED LEFT VENTRICULAR WALL MOTION STUDY LEFT VENTRICULAR EJECTION FRACTION  Technique:  Standard myocardial SPECT imaging was performed after resting intravenous injection of 10 mCi Tc-53m tetrofosmin. Subsequently, intravenous infusion of regadenoson was performed under the supervision of the Cardiology staff.  At peak effect of the drug, 30 mCi Tc-84m tetrofosmin was injected intravenously and standard myocardial SPECT  imaging was performed.  Quantitative gated imaging was also performed to evaluate left ventricular wall motion, and estimate left ventricular ejection fraction.  Comparison:  CT chest 03/04/2011.  Findings: No fixed or reversible perfusion defect is identified. End-diastolic volume is 51 ml and end-systolic volume is 12 ml. Estimated ejection fraction is 76%. Wall motion appears normal.  IMPRESSION: Normal study.  Original Report Authenticated By: Bernadene Bell. Maricela Curet, M.D.   Nm Pet Image Restag (ps) Skull Base To Thigh  05/03/2011  *RADIOLOGY REPORT*  Clinical Data:  Non-small cell lung cancer. Subsequent treatment strategy.  NUCLEAR MEDICINE PET/CT  Technique:  17.4 mCi F-18 FDG was injected intravenously.  Full- ring PET imaging was performed from the skull base through the mid- thighs.  CT data was obtained and used for attenuation correction and anatomic localization only.  (This was not acquired as a diagnostic CT examination.)  Data  regarding site of injection, patient weight, post injection waiting period, and fasting blood sugar is available in the EPIC documentation.  Comparison: 02/21/2010  Findings: Somewhat conglomerate right internal jugular and posterior cervical triangle adenopathy is present at the C2-3 level and extending posterior to the right mandible.  Maximum standard uptake value is 20.6, and these nodes measure up to 1.5 cm in short axis.  A new right supraclavicular node has a short axis diameter of 1.1 cm and a maximum standard uptake value of 11.1, compatible with malignancy.  A new right upper paratracheal node has a short axis diameter of 2.2 cm and a maximum standard uptake value of 17.0, compatible with malignancy.  The previously hypermetabolic activity in the paraesophageal region and AP window is no longer hypermetabolic.  A moderate pericardial effusion is increased in size.  A new lytic left iliac bone lesion with a small amount of sclerosis centrally is hypermetabolic, with a maximum standard uptake value of 8.1, compatible with osseous metastatic disease.  A small focus of hypermetabolic activity along the margin of the right adrenal gland is not accompanied by a visible adrenal mass, but has a maximum standard uptake value of 6.1, concerning for early adrenal metastatic lesion.  Emphysema noted with volume loss and atelectasis in the left lower lung.  The appendix appears normal.  There is sigmoid diverticulosis.  IMPRESSION:  1.  New hypermetabolic metastatic disease along the right internal jugular and posterior cervical triangle chains in the neck; the right supraclavicular nodes; new right upper paratracheal node; left iliac bone; and likely in the right adrenal gland. The appearance is compatible with progression of disease. 2.  The small paraesophageal foci of hypermetabolic activity shown on the prior exam have resolved. 3.  Emphysema. 4.  Moderate pericardial effusion, increased. 5.  Left lung basilar  volume loss/atelectasis.  Original Report Authenticated By: Dellia Cloud, M.D.    ASSESSMENT/PLAN: This is a very pleasant 61 years old African American female with recurrent and now metastatic non-small cell lung cancer, adenocarcinoma, presenting with right cervical, supraclavicular, mediastinal lymphadenopathy as well as left iliac crest bone lesion. The patient was discussed with Dr. Arbutus Ped. She will proceed with cycle #2 of her systemic chemotherapy with Taxotere. I have reviewed the instructions for dexamethasone and she voiced understanding. New prescriptions for her dexamethasone and Compazine were sent via E. described to her pharmacy is of record as well as a prescription for Diflucan to address the oral candidiasis. She will get weekly labs consisting of a CBC differential and C. met and return in 3 weeks prior to cycle #3 with a repeat CBC differential and C. met as well.  Laural Benes, Bertine Schlottman E, PA-C   All questions were answered. The patient knows to call the clinic with any problems, questions or concerns. We can certainly see the patient much sooner if necessary.

## 2011-06-14 ENCOUNTER — Telehealth: Payer: Self-pay | Admitting: *Deleted

## 2011-06-18 ENCOUNTER — Encounter: Payer: Self-pay | Admitting: Lab

## 2011-06-18 ENCOUNTER — Ambulatory Visit
Admission: RE | Admit: 2011-06-18 | Discharge: 2011-06-18 | Disposition: A | Discharge status: Home / Self Care | Source: Ambulatory Visit | Attending: Radiation Oncology | Admitting: Radiation Oncology

## 2011-06-18 ENCOUNTER — Ambulatory Visit (HOSPITAL_BASED_OUTPATIENT_CLINIC_OR_DEPARTMENT_OTHER)

## 2011-06-18 ENCOUNTER — Other Ambulatory Visit: Payer: Self-pay | Admitting: Certified Registered Nurse Anesthetist

## 2011-06-18 ENCOUNTER — Ambulatory Visit (HOSPITAL_BASED_OUTPATIENT_CLINIC_OR_DEPARTMENT_OTHER): Admitting: Lab

## 2011-06-18 VITALS — BP 103/67 | HR 139 | Temp 98.7°F | Resp 18

## 2011-06-18 DIAGNOSIS — Z5111 Encounter for antineoplastic chemotherapy: Secondary | ICD-10-CM

## 2011-06-18 DIAGNOSIS — C770 Secondary and unspecified malignant neoplasm of lymph nodes of head, face and neck: Secondary | ICD-10-CM

## 2011-06-18 DIAGNOSIS — C3490 Malignant neoplasm of unspecified part of unspecified bronchus or lung: Secondary | ICD-10-CM

## 2011-06-18 LAB — CBC WITH DIFFERENTIAL/PLATELET
BASO%: 0.1 % (ref 0.0–2.0)
Basophils Absolute: 0 10*3/uL (ref 0.0–0.1)
EOS%: 0.4 % (ref 0.0–7.0)
HCT: 35.4 % (ref 34.8–46.6)
LYMPH%: 5.6 % — ABNORMAL LOW (ref 14.0–49.7)
MCH: 31 pg (ref 25.1–34.0)
MCHC: 33.7 g/dL (ref 31.5–36.0)
MONO#: 0.4 10*3/uL (ref 0.1–0.9)
NEUT%: 90.1 % — ABNORMAL HIGH (ref 38.4–76.8)
Platelets: 201 10*3/uL (ref 145–400)

## 2011-06-18 LAB — COMPREHENSIVE METABOLIC PANEL
ALT: <8 U/L (ref 0–35)
AST: 14 U/L (ref 0–37)
Albumin: 3.8 g/dL (ref 3.5–5.2)
BUN: 17 mg/dL (ref 6–23)
Chloride: 99 mEq/L (ref 96–112)
Creatinine, Ser: 1.1 mg/dL (ref 0.50–1.10)
Sodium: 136 mEq/L (ref 135–145)
Total Bilirubin: 0.3 mg/dL (ref 0.3–1.2)

## 2011-06-18 MED ORDER — MORPHINE SULFATE 15 MG PO TABS: 15.0000 mg | ORAL_TABLET | ORAL | Status: AC | PRN

## 2011-06-18 MED ORDER — SODIUM CHLORIDE 0.9 % IV SOLN
Freq: Once | INTRAVENOUS | Status: AC
  Administered 2011-06-18: 15:00:00 via INTRAVENOUS

## 2011-06-18 NOTE — Progress Notes (Signed)
Stopped by nursing today with complaints of sore, raw throat.  Was able to attend granddaughter's graduation. Lortab, carafate, MMW not working.   On physical exam: dry desquamation in treatment field. Some patchy moist desquamation. Orthostatic vitals reviewed.   Try MSIR for pain. Continue biafene. 1L NS IVF for dehydration.

## 2011-06-18 NOTE — Progress Notes (Addendum)
HERE TO DAY WITH C/O RASH ON NECK AND FACE.  ITCHING, BURNING AND SORE.  USING BIAFINE WITH NO RESULTS.  NOTED DARK RED AREA ON RIGHT NECK AND JAW WITH FINE RASH.  SHE ALSO C/O SORE THROAT, HAS USED MMW, CARAFATE, LORTAB ELIXER.  STATES NONE HAS HELPED.  RADIATION COMPLETED FEB. 6, 2013.  VS........103/67........P..139.....T..98.7......R..18            STANDING BP   93/56.....Marland KitchenMarland KitchenP  115

## 2011-06-25 ENCOUNTER — Other Ambulatory Visit (HOSPITAL_BASED_OUTPATIENT_CLINIC_OR_DEPARTMENT_OTHER): Payer: Self-pay | Admitting: Lab

## 2011-06-25 DIAGNOSIS — C3490 Malignant neoplasm of unspecified part of unspecified bronchus or lung: Secondary | ICD-10-CM

## 2011-06-25 LAB — COMPREHENSIVE METABOLIC PANEL
ALT: <8 U/L (ref 0–35)
AST: 16 U/L (ref 0–37)
Alkaline Phosphatase: 140 U/L — ABNORMAL HIGH (ref 39–117)
BUN: 15 mg/dL (ref 6–23)
Calcium: 9.3 mg/dL (ref 8.4–10.5)
Chloride: 101 mEq/L (ref 96–112)
Creatinine, Ser: 0.97 mg/dL (ref 0.50–1.10)
Total Bilirubin: 0.2 mg/dL — ABNORMAL LOW (ref 0.3–1.2)

## 2011-06-25 LAB — CBC WITH DIFFERENTIAL/PLATELET
BASO%: 0 % (ref 0.0–2.0)
EOS%: 0.7 % (ref 0.0–7.0)
HCT: 35.1 % (ref 34.8–46.6)
MCH: 30.6 pg (ref 25.1–34.0)
MCHC: 33.4 g/dL (ref 31.5–36.0)
MCV: 91.6 fL (ref 79.5–101.0)
MONO%: 13.9 % (ref 0.0–14.0)
NEUT%: 71.4 % (ref 38.4–76.8)
lymph#: 0.7 10*3/uL — ABNORMAL LOW (ref 0.9–3.3)

## 2011-07-02 ENCOUNTER — Other Ambulatory Visit: Admitting: Lab

## 2011-07-02 ENCOUNTER — Other Ambulatory Visit: Payer: Self-pay | Admitting: Oncology

## 2011-07-02 ENCOUNTER — Ambulatory Visit (HOSPITAL_BASED_OUTPATIENT_CLINIC_OR_DEPARTMENT_OTHER)

## 2011-07-02 ENCOUNTER — Ambulatory Visit (HOSPITAL_BASED_OUTPATIENT_CLINIC_OR_DEPARTMENT_OTHER): Admitting: Physician Assistant

## 2011-07-02 ENCOUNTER — Telehealth: Payer: Self-pay | Admitting: Internal Medicine

## 2011-07-02 ENCOUNTER — Encounter: Payer: Self-pay | Admitting: Physician Assistant

## 2011-07-02 DIAGNOSIS — C3490 Malignant neoplasm of unspecified part of unspecified bronchus or lung: Secondary | ICD-10-CM

## 2011-07-02 DIAGNOSIS — C7952 Secondary malignant neoplasm of bone marrow: Secondary | ICD-10-CM

## 2011-07-02 DIAGNOSIS — C7951 Secondary malignant neoplasm of bone: Secondary | ICD-10-CM

## 2011-07-02 DIAGNOSIS — C770 Secondary and unspecified malignant neoplasm of lymph nodes of head, face and neck: Secondary | ICD-10-CM

## 2011-07-02 DIAGNOSIS — Z5111 Encounter for antineoplastic chemotherapy: Secondary | ICD-10-CM

## 2011-07-02 DIAGNOSIS — C50919 Malignant neoplasm of unspecified site of unspecified female breast: Secondary | ICD-10-CM

## 2011-07-02 LAB — COMPREHENSIVE METABOLIC PANEL
BUN: 19 mg/dL (ref 6–23)
CO2: 24 mEq/L (ref 19–32)
Calcium: 10 mg/dL (ref 8.4–10.5)
Chloride: 104 mEq/L (ref 96–112)
Creatinine, Ser: 0.85 mg/dL (ref 0.50–1.10)
Glucose, Bld: 117 mg/dL — ABNORMAL HIGH (ref 70–99)
Total Bilirubin: 0.2 mg/dL — ABNORMAL LOW (ref 0.3–1.2)

## 2011-07-02 LAB — CBC WITH DIFFERENTIAL/PLATELET
BASO%: 0.2 % (ref 0.0–2.0)
Eosinophils Absolute: 0 10*3/uL (ref 0.0–0.5)
HCT: 35.1 % (ref 34.8–46.6)
HGB: 11.5 g/dL — ABNORMAL LOW (ref 11.6–15.9)
LYMPH%: 5.8 % — ABNORMAL LOW (ref 14.0–49.7)
MCHC: 32.7 g/dL (ref 31.5–36.0)
MONO#: 0.9 10*3/uL (ref 0.1–0.9)
NEUT#: 8.9 10*3/uL — ABNORMAL HIGH (ref 1.5–6.5)
NEUT%: 85.1 % — ABNORMAL HIGH (ref 38.4–76.8)
Platelets: 507 10*3/uL — ABNORMAL HIGH (ref 145–400)
WBC: 10.5 10*3/uL — ABNORMAL HIGH (ref 3.9–10.3)
lymph#: 0.6 10*3/uL — ABNORMAL LOW (ref 0.9–3.3)

## 2011-07-02 MED ORDER — DOCETAXEL CHEMO INJECTION 160 MG/16ML
75.0000 mg/m2 | Freq: Once | INTRAVENOUS | Status: AC
  Administered 2011-07-02: 140 mg via INTRAVENOUS
  Filled 2011-07-02: qty 14

## 2011-07-02 MED ORDER — DEXAMETHASONE SODIUM PHOSPHATE 10 MG/ML IJ SOLN
10.0000 mg | Freq: Once | INTRAMUSCULAR | Status: AC
  Administered 2011-07-02: 10 mg via INTRAVENOUS

## 2011-07-02 MED ORDER — ONDANSETRON 8 MG/50ML IVPB (CHCC)
8.0000 mg | Freq: Once | INTRAVENOUS | Status: AC
  Administered 2011-07-02: 8 mg via INTRAVENOUS

## 2011-07-02 MED ORDER — SODIUM CHLORIDE 0.9 % IV SOLN: Freq: Once | INTRAVENOUS | Status: DC

## 2011-07-02 NOTE — Telephone Encounter (Signed)
appts made and will have pt p/u sch 3/6  aom

## 2011-07-02 NOTE — Patient Instructions (Signed)
 Cancer Center Discharge Instructions for Patients Receiving Chemotherapy  Today you received the following chemotherapy agents Taxotere To help prevent nausea and vomiting after your treatment, we encourage you to take your nausea medication as prescribed by your MD.   If you develop nausea and vomiting that is not controlled by your nausea medication, call the clinic @ [phone]  BELOW ARE SYMPTOMS THAT SHOULD BE REPORTED IMMEDIATELY:  *FEVER GREATER THAN 100.5 F  *CHILLS WITH OR WITHOUT FEVER  NAUSEA AND VOMITING THAT IS NOT CONTROLLED WITH YOUR NAUSEA MEDICATION  *UNUSUAL SHORTNESS OF BREATH  *UNUSUAL BRUISING OR BLEEDING  TENDERNESS IN MOUTH AND THROAT WITH OR WITHOUT PRESENCE OF ULCERS  *URINARY PROBLEMS  *BOWEL PROBLEMS  UNUSUAL RASH Items with * indicate a potential emergency and should be followed up as soon as possible.

## 2011-07-03 ENCOUNTER — Other Ambulatory Visit: Payer: Self-pay | Admitting: Physician Assistant

## 2011-07-03 ENCOUNTER — Telehealth: Payer: Self-pay | Admitting: Internal Medicine

## 2011-07-03 ENCOUNTER — Ambulatory Visit (HOSPITAL_BASED_OUTPATIENT_CLINIC_OR_DEPARTMENT_OTHER)

## 2011-07-03 DIAGNOSIS — C770 Secondary and unspecified malignant neoplasm of lymph nodes of head, face and neck: Secondary | ICD-10-CM

## 2011-07-03 DIAGNOSIS — Z5189 Encounter for other specified aftercare: Secondary | ICD-10-CM

## 2011-07-03 DIAGNOSIS — C3490 Malignant neoplasm of unspecified part of unspecified bronchus or lung: Secondary | ICD-10-CM

## 2011-07-03 MED ORDER — PEGFILGRASTIM INJECTION 6 MG/0.6ML
6.0000 mg | Freq: Once | SUBCUTANEOUS | Status: AC
Start: 2011-07-03 — End: 2011-07-03
  Administered 2011-07-03: 6 mg via SUBCUTANEOUS
  Filled 2011-07-03: qty 0.6

## 2011-07-03 NOTE — Telephone Encounter (Signed)
pt came by and picked up schedule for march scheduled pt for ct scan on 03/22 @ [facility]

## 2011-07-04 NOTE — Progress Notes (Signed)
[facility] OFFICE PROGRESS NOTE  Kaitlin Rodriguez, MD, MD [address] [place] [address]  DIAGNOSIS: Recurrent non-small cell lung cancer initially diagnosed as stage IIA (T1 M1 MX) adenocarcinoma in April 2011.   PRIOR THERAPY:  1. Status post 3 cycles of neoadjuvant chemotherapy with carboplatin and Alimta. Last dose was given July 23, 2009. 2. Status post left lower lobectomy with lymph node dissection under the care of Dr. Edwyna Shell on October 17, 2009. 3. Status post concurrent chemoradiation with weekly carboplatin and paclitaxel for disease recurrence. Last dose was given May 07, 2010. 4. Status post 3 cycles of consolidation chemotherapy with carboplatin and Alimta. Last dose was given August 13, 2010. 5. Status post palliative radiotherapy to the right neck area  CURRENT THERAPY:  1.Taxotere 75 mg/m2 every 3 weeks,, status post 2 cycles   INTERVAL HISTORY: Kaitlin Reed 61 y.o. female returns to the clinic today for followup visit. She is tolerating her chemotherapy relatively well. She reports improvement in her sore throat since completing her radiation therapy. She also has noted improvement in the skin in the area where she received her radiation therapy. She has occasional nausea that is well-controlled with her current anti-emetics. She voiced no specific complaints today.   MEDICAL HISTORY: Past Medical History  Diagnosis Date  . Anxiety   . Hypercholesteremia   . Heart murmur   . TIA (transient ischemic attack) 04/16/11  . Tubal pregnancy   . lung ca dx'd 06/2009    chemo comp 07/2010; xrt comp 06/2010    ALLERGIES:  is allergic to codeine; motrin; other; trazodone and nefazodone; banana; and tape.  MEDICATIONS:  Current Outpatient Prescriptions  Medication Sig Dispense Refill  . Alum & Mag Hydroxide-Simeth (MAGIC MOUTHWASH W/LIDOCAINE) SOLN Take 10 mLs by mouth 4 (four) times daily as needed.  500 mL  5  . Ascorbic Acid (VITAMIN C PO)  Take by mouth daily.        Marland Kitchen aspirin 81 MG tablet Take 81 mg by mouth daily.        Marland Kitchen dexamethasone (DECADRON) 4 MG tablet Take 2 tablets by mouth twice a day, the day before, the day of and the day after chemotherapy  40 tablet  1  . emollient (BIAFINE) cream Apply topically as needed.      . fluconazole (DIFLUCAN) 100 MG tablet Take 2 tablets by mouth on day one, then one table by mouth daily until completed  16 tablet  0  . FOLIC ACID PO Take by mouth daily.        . IRON COMBINATIONS PO Take by mouth daily.        Marland Kitchen lidocaine (XYLOCAINE JELLY) 2 % jelly Apply topically as needed.  30 mL  0  . Multiple Vitamin (MULITIVITAMIN WITH MINERALS) TABS Take 1 tablet by mouth daily.        . ondansetron (ZOFRAN) 4 MG tablet Take 4 mg by mouth every 8 (eight) hours as needed.      . potassium chloride (MICRO-K) 10 MEQ CR capsule Take 5 mEq by mouth every other day.        Marland Kitchen VITAMIN D, CHOLECALCIFEROL, PO Take by mouth daily.         No current facility-administered medications for this visit.   Facility-Administered Medications Ordered in Other Visits  Medication Dose Route Frequency Provider Last Rate Last Dose  . pegfilgrastim (NEULASTA) injection 6 mg  6 mg Subcutaneous Once Tiana Loft, [place]  6 mg at 07/03/11 1054    SURGICAL HISTORY:  Past Surgical History  Procedure Date  . Lung removal, partial 10/17/09    left  . 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   PHYSICAL EXAMINATION: General appearance: alert, cooperative and no distress Head: Normocephalic, without obvious abnormality, atraumatic Neck: moderate anterior cervical adenopathy Lymph nodes: Cervical adenopathy: right cervical and Supraclavicular adenopathy: right Resp: clear to auscultation bilaterally Cardio: regular rate and rhythm, S1, S2 normal, no murmur, click, rub or gallop GI: soft, non-tender; bowel sounds normal; no masses,  no  organomegaly Extremities: extremities normal, atraumatic, no cyanosis or edema Neurologic: Alert and oriented X 3, normal strength and tone. Normal symmetric reflexes. Normal coordination and gait   ECOG PERFORMANCE STATUS: 1 - Symptomatic but completely ambulatory  Blood pressure 119/68, pulse 97, temperature 98.1 F (36.7 C), temperature source Oral, height 5\' 6"  (1.676 m), weight 163 lb 8 oz (74.163 kg).  LABORATORY DATA: Lab Results  Component Value Date   WBC 10.5* 07/02/2011   HGB 11.5* 07/02/2011   HCT 35.1 07/02/2011   MCV 92.1 07/02/2011   PLT 507* 07/02/2011      Chemistry      Component Value Date/Time   NA 140 07/02/2011 1127   NA 146* 03/04/2011 0806   NA 146* 03/04/2011 0806   K 4.4 07/02/2011 1127   K 3.8 03/04/2011 0806   K 3.8 03/04/2011 0806   CL 104 07/02/2011 1127   CL 102 03/04/2011 0806   CL 102 03/04/2011 0806   CO2 24 07/02/2011 1127   CO2 32 03/04/2011 0806   CO2 32 03/04/2011 0806   BUN 19 07/02/2011 1127   BUN 12 03/04/2011 0806   BUN 12 03/04/2011 0806   CREATININE 0.85 07/02/2011 1127   CREATININE 0.9 03/04/2011 0806   CREATININE 0.9 03/04/2011 0806      Component Value Date/Time   CALCIUM 10.0 07/02/2011 1127   CALCIUM 9.0 03/04/2011 0806   CALCIUM 9.0 03/04/2011 0806   ALKPHOS 127* 07/02/2011 1127   ALKPHOS 119* 03/04/2011 0806   ALKPHOS 119* 03/04/2011 0806   AST 16 07/02/2011 1127   AST 18 03/04/2011 0806   AST 18 03/04/2011 0806   ALT <8 07/02/2011 1127   BILITOT 0.2* 07/02/2011 1127   BILITOT 0.40 03/04/2011 0806   BILITOT 0.40 03/04/2011 0806       RADIOGRAPHIC STUDIES: Mr Brain Wo Contrast  04/17/2011  *RADIOLOGY REPORT*  Clinical Data:  Left sided numbness.  MRI HEAD WITHOUT CONTRAST MRA HEAD WITHOUT CONTRAST  Technique: Multiplanar, multiecho pulse sequences of the brain and surrounding structures were obtained according to standard protocol without intravenous contrast.  Angiographic images of the head were obtained using MRA technique without contrast.  Comparison:  03/27/2011 CT.  07/18/2009 MR.  MRI HEAD  Findings:  No acute infarct.  No intracranial hemorrhage.  No intracranial mass lesion detected on this unenhanced exam.  No hydrocephalus.  Small left vertebral artery.  Please see above.  Minimal paranasal sinus mucosal thickening.  IMPRESSION: No acute infarct.  MRA HEAD  Findings: Anterior circulation without medium or large size vessel significant stenosis or occlusion.  Just beyond the first branch of the left middle cerebral artery there is a tiny bulge which appears to be origin of a vessel rather than a tiny aneurysm.  Left vertebral artery ends in a PICA distribution.  Mild  to moderate tandem stenosis involving portions of the left vertebral artery and left PICA.  Ectatic right vertebral artery without high-grade stenosis.  Mild ectasia of the basilar artery without high-grade stenosis.  Nonvisualization AICAs.  Mild branch vessel irregularity.  IMPRESSION: Mild to moderate narrowing of portions of the left vertebral artery and left PICA.  The left vertebral artery ends in a PICA distribution.  Please see above.  Original Report Authenticated By: Fuller Canada, M.D.   Nm Myocar Multi W/spect W/wall Motion / Ef  04/17/2011  *RADIOLOGY REPORT*  Clinical Data:  Chest pain.  MYOCARDIAL IMAGING WITH SPECT (REST AND PHARMACOLOGIC-STRESS) GATED LEFT VENTRICULAR WALL MOTION STUDY LEFT VENTRICULAR EJECTION FRACTION  Technique:  Standard myocardial SPECT imaging was performed after resting intravenous injection of 10 mCi Tc-101m tetrofosmin. Subsequently, intravenous infusion of regadenoson was performed under the supervision of the Cardiology staff.  At peak effect of the drug, 30 mCi Tc-18m tetrofosmin was injected intravenously and standard myocardial SPECT  imaging was performed.  Quantitative gated imaging was also performed to evaluate left ventricular wall motion, and estimate left ventricular ejection fraction.  Comparison:  CT chest 03/04/2011.  Findings: No  fixed or reversible perfusion defect is identified. End-diastolic volume is 51 ml and end-systolic volume is 12 ml. Estimated ejection fraction is 76%. Wall motion appears normal.  IMPRESSION: Normal study.  Original Report Authenticated By: Bernadene Bell. Maricela Curet, M.D.   Nm Pet Image Restag (ps) Skull Base To Thigh  05/03/2011  *RADIOLOGY REPORT*  Clinical Data:  Non-small cell lung cancer. Subsequent treatment strategy.  NUCLEAR MEDICINE PET/CT  Technique:  17.4 mCi F-18 FDG was injected intravenously.  Full- ring PET imaging was performed from the skull base through the mid- thighs.  CT data was obtained and used for attenuation correction and anatomic localization only.  (This was not acquired as a diagnostic CT examination.)  Data regarding site of injection, patient weight, post injection waiting period, and fasting blood sugar is available in the EPIC documentation.  Comparison: 02/21/2010  Findings: Somewhat conglomerate right internal jugular and posterior cervical triangle adenopathy is present at the C2-3 level and extending posterior to the right mandible.  Maximum standard uptake value is 20.6, and these nodes measure up to 1.5 cm in short axis.  A new right supraclavicular node has a short axis diameter of 1.1 cm and a maximum standard uptake value of 11.1, compatible with malignancy.  A new right upper paratracheal node has a short axis diameter of 2.2 cm and a maximum standard uptake value of 17.0, compatible with malignancy.  The previously hypermetabolic activity in the paraesophageal region and AP window is no longer hypermetabolic.  A moderate pericardial effusion is increased in size.  A new lytic left iliac bone lesion with a small amount of sclerosis centrally is hypermetabolic, with a maximum standard uptake value of 8.1, compatible with osseous metastatic disease.  A small focus of hypermetabolic activity along the margin of the right adrenal gland is not accompanied by a visible adrenal  mass, but has a maximum standard uptake value of 6.1, concerning for early adrenal metastatic lesion.  Emphysema noted with volume loss and atelectasis in the left lower lung.  The appendix appears normal.  There is sigmoid diverticulosis.  IMPRESSION:  1.  New hypermetabolic metastatic disease along the right internal jugular and posterior cervical triangle chains in the neck; the right supraclavicular nodes; new right upper paratracheal node; left iliac bone; and likely in the right adrenal gland. The appearance  is compatible with progression of disease. 2.  The small paraesophageal foci of hypermetabolic activity shown on the prior exam have resolved. 3.  Emphysema. 4.  Moderate pericardial effusion, increased. 5.  Left lung basilar volume loss/atelectasis.  Original Report Authenticated By: Dellia Cloud, M.D.    ASSESSMENT/PLAN: This is a very pleasant 61 years old African American female with recurrent and now metastatic non-small cell lung cancer, adenocarcinoma, presenting with right cervical, supraclavicular, mediastinal lymphadenopathy as well as left iliac crest bone lesion. The patient was discussed with Dr. Darrold Span in  Dr. Asa Lente absence . She will proceed with cycle #3 of her systemic chemotherapy with Taxotere. She'll return in 3 weeks with repeat CBC differential C. met and CT of the chest abdomen and pelvis as well as the neck with contrast to reevaluate her disease. She'll see Dr. Arbutus Ped at that visit she will discuss the results of the restaging CT scans as well as discuss further treatment plans.   Laural Benes, Eluterio Seymour E, PA-C   All questions were answered. The patient knows to call the clinic with any problems, questions or concerns. We can certainly see the patient much sooner if necessary.

## 2011-07-09 ENCOUNTER — Other Ambulatory Visit (HOSPITAL_BASED_OUTPATIENT_CLINIC_OR_DEPARTMENT_OTHER): Payer: Self-pay

## 2011-07-09 DIAGNOSIS — C3490 Malignant neoplasm of unspecified part of unspecified bronchus or lung: Secondary | ICD-10-CM

## 2011-07-09 LAB — COMPREHENSIVE METABOLIC PANEL
BUN: 13 mg/dL (ref 6–23)
CO2: 28 mEq/L (ref 19–32)
Calcium: 8.9 mg/dL (ref 8.4–10.5)
Chloride: 105 mEq/L (ref 96–112)
Creatinine, Ser: 1 mg/dL (ref 0.50–1.10)
Total Bilirubin: 0.3 mg/dL (ref 0.3–1.2)

## 2011-07-09 LAB — CBC WITH DIFFERENTIAL/PLATELET
BASO%: 0 % (ref 0.0–2.0)
Basophils Absolute: 0 10*3/uL (ref 0.0–0.1)
HCT: 34.4 % — ABNORMAL LOW (ref 34.8–46.6)
HGB: 11.3 g/dL — ABNORMAL LOW (ref 11.6–15.9)
LYMPH%: 8.6 % — ABNORMAL LOW (ref 14.0–49.7)
MCH: 30.4 pg (ref 25.1–34.0)
MCHC: 32.8 g/dL (ref 31.5–36.0)
MONO#: 0.6 10*3/uL (ref 0.1–0.9)
NEUT%: 83.8 % — ABNORMAL HIGH (ref 38.4–76.8)
Platelets: 251 10*3/uL (ref 145–400)
WBC: 8.4 10*3/uL (ref 3.9–10.3)
lymph#: 0.7 10*3/uL — ABNORMAL LOW (ref 0.9–3.3)

## 2011-07-10 DIAGNOSIS — C801 Malignant (primary) neoplasm, unspecified: Secondary | ICD-10-CM | POA: Insufficient documentation

## 2011-07-11 ENCOUNTER — Ambulatory Visit
Admission: RE | Admit: 2011-07-11 | Discharge: 2011-07-11 | Disposition: A | Discharge status: Home / Self Care | Source: Ambulatory Visit | Attending: Radiation Oncology | Admitting: Radiation Oncology

## 2011-07-11 ENCOUNTER — Encounter: Payer: Self-pay | Admitting: Radiation Oncology

## 2011-07-11 VITALS — BP 108/65 | HR 110 | Temp 97.1°F | Resp 18 | Wt 163.8 lb

## 2011-07-11 DIAGNOSIS — C770 Secondary and unspecified malignant neoplasm of lymph nodes of head, face and neck: Secondary | ICD-10-CM

## 2011-07-11 NOTE — Progress Notes (Signed)
CC:   Kaitlin Reed, M.D.  DIAGNOSIS:  Metastatic non-small cell lung cancer to right neck.  TREATMENT DATES:  05/22/2011 to 06/03/2010  RADIATION DOSE:  30 Gy.  INTERVAL SINCE TREATMENT:  1 month.  INTERVAL HISTORY:  Kaitlin Reed reports for followup today.  She is doing well. Her dark skin eventually healed.  She has some minimal odynophagia on the right.  She is not taking any pain medications for that.  She has a scan scheduled with Dr. Arbutus Ped at the end of the month and then possible further chemotherapy.  She does complain of some mouth sores particularly worse on the right, which she cannot wear her partial dentures because of, and asked me to check that out.  No other complaints and states that she feels very well.  She feels like she is going to beat this cancer.  PHYSICAL EXAMINATION:  Her weight is stable at 164 pounds.  Blood pressure 108/65, pulse 110, temperature 97.1, pulse ox 95% on room air. Her right neck is slightly hyperpigmented as compared to the left.  I am not really able to see anything besides some 2 mm ulcers on her tongue. She has no palpable adenopathy in the submandibular or cervical region of her right neck.  IMPRESSION:  Metastatic non-small cell lung cancer to right neck status post palliative radiation with resolving acute effects of treatment.  RECOMMENDATIONS:  Kaitlin Reed looks great.  Her skin is healing up nicely. She declined referral to Dermatology.  She will follow up with Dr. Arbutus Ped at the end of the month.  She knows to contact me with any questions or concerns.  We talked about further radiation to any other areas that were growing if they were isolated areas.  She has still not talked to her family about her diagnosis.    ______________________________ Lurline Hare, M.D. SW/MEDQ  D:  07/11/2011  T:  07/11/2011  Job:  [id number]

## 2011-07-16 ENCOUNTER — Other Ambulatory Visit: Payer: Self-pay | Admitting: Lab

## 2011-07-16 ENCOUNTER — Telehealth: Payer: Self-pay | Admitting: Internal Medicine

## 2011-07-16 ENCOUNTER — Other Ambulatory Visit: Payer: Self-pay

## 2011-07-16 NOTE — Telephone Encounter (Signed)
pt called and needs labs 3/22   aom

## 2011-07-19 ENCOUNTER — Ambulatory Visit (HOSPITAL_COMMUNITY)
Admission: RE | Admit: 2011-07-19 | Discharge: 2011-07-19 | Disposition: A | Discharge status: Home / Self Care | Source: Ambulatory Visit | Attending: Physician Assistant | Admitting: Physician Assistant

## 2011-07-19 ENCOUNTER — Other Ambulatory Visit (HOSPITAL_BASED_OUTPATIENT_CLINIC_OR_DEPARTMENT_OTHER): Admitting: Lab

## 2011-07-19 DIAGNOSIS — C7951 Secondary malignant neoplasm of bone: Secondary | ICD-10-CM | POA: Insufficient documentation

## 2011-07-19 DIAGNOSIS — C3490 Malignant neoplasm of unspecified part of unspecified bronchus or lung: Secondary | ICD-10-CM

## 2011-07-19 DIAGNOSIS — C7952 Secondary malignant neoplasm of bone marrow: Secondary | ICD-10-CM | POA: Insufficient documentation

## 2011-07-19 DIAGNOSIS — Z79899 Other long term (current) drug therapy: Secondary | ICD-10-CM | POA: Insufficient documentation

## 2011-07-19 DIAGNOSIS — C770 Secondary and unspecified malignant neoplasm of lymph nodes of head, face and neck: Secondary | ICD-10-CM

## 2011-07-19 LAB — CBC WITH DIFFERENTIAL/PLATELET
BASO%: 0.3 % (ref 0.0–2.0)
Basophils Absolute: 0 10*3/uL (ref 0.0–0.1)
Eosinophils Absolute: 0 10*3/uL (ref 0.0–0.5)
HCT: 34.3 % — ABNORMAL LOW (ref 34.8–46.6)
HGB: 11.4 g/dL — ABNORMAL LOW (ref 11.6–15.9)
LYMPH%: 15.7 % (ref 14.0–49.7)
MONO#: 0.7 10*3/uL (ref 0.1–0.9)
NEUT#: 3.6 10*3/uL (ref 1.5–6.5)
NEUT%: 70.3 % (ref 38.4–76.8)
Platelets: 418 10*3/uL — ABNORMAL HIGH (ref 145–400)
WBC: 5.2 10*3/uL (ref 3.9–10.3)
lymph#: 0.8 10*3/uL — ABNORMAL LOW (ref 0.9–3.3)

## 2011-07-19 LAB — COMPREHENSIVE METABOLIC PANEL
ALT: <8 U/L (ref 0–35)
BUN: 12 mg/dL (ref 6–23)
CO2: 26 mEq/L (ref 19–32)
Calcium: 9.6 mg/dL (ref 8.4–10.5)
Chloride: 104 mEq/L (ref 96–112)
Creatinine, Ser: 0.96 mg/dL (ref 0.50–1.10)
Glucose, Bld: 114 mg/dL — ABNORMAL HIGH (ref 70–99)

## 2011-07-19 MED ORDER — IOHEXOL 300 MG/ML  SOLN
125.0000 mL | Freq: Once | INTRAMUSCULAR | Status: AC | PRN
  Administered 2011-07-19: 125 mL via INTRAVENOUS

## 2011-07-23 ENCOUNTER — Ambulatory Visit: Admitting: Internal Medicine

## 2011-07-23 ENCOUNTER — Other Ambulatory Visit (HOSPITAL_BASED_OUTPATIENT_CLINIC_OR_DEPARTMENT_OTHER): Payer: Self-pay | Admitting: Lab

## 2011-07-23 ENCOUNTER — Telehealth: Payer: Self-pay | Admitting: Internal Medicine

## 2011-07-23 ENCOUNTER — Other Ambulatory Visit: Payer: Self-pay | Admitting: Emergency Medicine

## 2011-07-23 ENCOUNTER — Ambulatory Visit (HOSPITAL_BASED_OUTPATIENT_CLINIC_OR_DEPARTMENT_OTHER)

## 2011-07-23 VITALS — BP 134/80 | HR 109 | Temp 97.3°F | Wt 163.4 lb

## 2011-07-23 DIAGNOSIS — C801 Malignant (primary) neoplasm, unspecified: Secondary | ICD-10-CM

## 2011-07-23 DIAGNOSIS — Z5111 Encounter for antineoplastic chemotherapy: Secondary | ICD-10-CM

## 2011-07-23 DIAGNOSIS — C3490 Malignant neoplasm of unspecified part of unspecified bronchus or lung: Secondary | ICD-10-CM

## 2011-07-23 DIAGNOSIS — C770 Secondary and unspecified malignant neoplasm of lymph nodes of head, face and neck: Secondary | ICD-10-CM

## 2011-07-23 LAB — COMPREHENSIVE METABOLIC PANEL
AST: 16 U/L (ref 0–37)
Alkaline Phosphatase: 140 U/L — ABNORMAL HIGH (ref 39–117)
BUN: 13 mg/dL (ref 6–23)
Calcium: 9.5 mg/dL (ref 8.4–10.5)
Creatinine, Ser: 0.87 mg/dL (ref 0.50–1.10)

## 2011-07-23 LAB — CBC WITH DIFFERENTIAL/PLATELET
Basophils Absolute: 0 10*3/uL (ref 0.0–0.1)
EOS%: 1.8 % (ref 0.0–7.0)
HCT: 34.4 % — ABNORMAL LOW (ref 34.8–46.6)
HGB: 11.1 g/dL — ABNORMAL LOW (ref 11.6–15.9)
LYMPH%: 23 % (ref 14.0–49.7)
MCH: 29.8 pg (ref 25.1–34.0)
MCV: 92.5 fL (ref 79.5–101.0)
MONO%: 18.3 % — ABNORMAL HIGH (ref 0.0–14.0)
NEUT%: 56.4 % (ref 38.4–76.8)

## 2011-07-23 MED ORDER — ONDANSETRON 8 MG/50ML IVPB (CHCC)
8.0000 mg | Freq: Once | INTRAVENOUS | Status: AC
  Administered 2011-07-23: 8 mg via INTRAVENOUS

## 2011-07-23 MED ORDER — DEXAMETHASONE SODIUM PHOSPHATE 10 MG/ML IJ SOLN
10.0000 mg | Freq: Once | INTRAMUSCULAR | Status: AC
  Administered 2011-07-23: 10 mg via INTRAVENOUS

## 2011-07-23 MED ORDER — DOCETAXEL CHEMO INJECTION 160 MG/16ML
75.0000 mg/m2 | Freq: Once | INTRAVENOUS | Status: AC
  Administered 2011-07-23: 140 mg via INTRAVENOUS
  Filled 2011-07-23: qty 14

## 2011-07-23 MED ORDER — SODIUM CHLORIDE 0.9 % IV SOLN
Freq: Once | INTRAVENOUS | Status: AC
  Administered 2011-07-23: 11:00:00 via INTRAVENOUS

## 2011-07-23 NOTE — Progress Notes (Signed)
[facility] [phone]   [phone]  OFFICE PROGRESS NOTE  [name], MD, MD [address] [place] [address]  DIAGNOSIS: Recurrent non-small cell lung cancer initially diagnosed as stage IIA (T1 M1 MX) adenocarcinoma in April 2011.   PRIOR THERAPY:  1. Status post 3 cycles of neoadjuvant chemotherapy with carboplatin and Alimta. Last dose was given July 23, 2009. 2. Status post left lower lobectomy with lymph node dissection under the care of Dr. Edwyna Shell on October 17, 2009. 3. Status post concurrent chemoradiation with weekly carboplatin and paclitaxel for disease recurrence. Last dose was given May 07, 2010. 4. Status post 3 cycles of consolidation chemotherapy with carboplatin and Alimta. Last dose was given August 13, 2010. 5. Status post palliative radiotherapy to the right neck area  CURRENT THERAPY: Systemic chemotherapy with single agent Taxotere 75 mg/m2 every 3 weeks,, status post 3 cycles.   INTERVAL HISTORY: Kaitlin Reed 61 y.o. female returns to the clinic today for followup visit. The patient tolerated the first 3 cycles of her systemic chemotherapy with Taxotere fairly well except for mild fatigue after the last cycle. She denied having any significant weight loss or night sweats. She has no nausea or vomiting, no fever or chills. She has no significant chest pain or shortness of breath. The patient has repeat CT scan of the, chest, abdomen and pelvis performed recently and she is here today for evaluation and discussion of her scan results.  MEDICAL HISTORY: Past Medical History  Diagnosis Date  . Anxiety   . Hypercholesteremia   . Heart murmur   . TIA (transient ischemic attack) 04/16/11  . Tubal pregnancy   . lung ca dx'd 06/2009    chemo comp 07/2010; xrt comp 06/2010    ALLERGIES:  is allergic to codeine; motrin; other; trazodone and nefazodone; banana; and tape.  MEDICATIONS:  Current Outpatient  Prescriptions  Medication Sig Dispense Refill  . aspirin 81 MG tablet Take 81 mg by mouth daily.        Marland Kitchen dexamethasone (DECADRON) 4 MG tablet Take 2 tablets by mouth twice a day, the day before, the day of and the day after chemotherapy  40 tablet  1  . emollient (BIAFINE) cream Apply topically as needed.      Marland Kitchen FOLIC ACID PO Take by mouth daily.        . IRON COMBINATIONS PO Take by mouth daily.        . Multiple Vitamin (MULITIVITAMIN WITH MINERALS) TABS Take 1 tablet by mouth daily.        . potassium chloride (MICRO-K) 10 MEQ CR capsule Take 5 mEq by mouth every other day.        Marland Kitchen VITAMIN D, CHOLECALCIFEROL, PO Take by mouth daily.        Marland Kitchen lidocaine (XYLOCAINE JELLY) 2 % jelly Apply topically as needed.  30 mL  0  . ondansetron (ZOFRAN) 4 MG tablet Take 4 mg by mouth every 8 (eight) hours as needed.        SURGICAL HISTORY:  Past Surgical History  Procedure Date  . Lung removal, partial 10/17/09    left  . 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 Resp: clear to auscultation bilaterally Cardio: regular rate and rhythm, S1, S2 normal, no murmur, click, rub or gallop GI: soft, non-tender; bowel sounds normal; no masses,  no organomegaly Extremities: extremities normal, atraumatic, no cyanosis or edema Neurologic: Alert and oriented X 3, normal strength and tone. Normal symmetric reflexes. Normal coordination and gait  ECOG PERFORMANCE STATUS: 1 - Symptomatic but completely ambulatory  Blood pressure 134/80, pulse 109, temperature 97.3 F (36.3 C), temperature source Oral, weight 163 lb 6.4 oz (74.118 kg).  LABORATORY  DATA: Lab Results  Component Value Date   WBC 3.8* 07/23/2011   HGB 11.1* 07/23/2011   HCT 34.4* 07/23/2011   MCV 92.5 07/23/2011   PLT 482* 07/23/2011      Chemistry      Component Value Date/Time   NA 142 07/19/2011 0908   NA 146* 03/04/2011 0806   NA 146* 03/04/2011 0806   K 4.3 07/19/2011 0908   K 3.8 03/04/2011 0806   K 3.8 03/04/2011 0806   CL 104 07/19/2011 0908   CL 102 03/04/2011 0806   CL 102 03/04/2011 0806   CO2 26 07/19/2011 0908   CO2 32 03/04/2011 0806   CO2 32 03/04/2011 0806   BUN 12 07/19/2011 0908   BUN 12 03/04/2011 0806   BUN 12 03/04/2011 0806   CREATININE 0.96 07/19/2011 0908   CREATININE 0.9 03/04/2011 0806   CREATININE 0.9 03/04/2011 0806      Component Value Date/Time   CALCIUM 9.6 07/19/2011 0908   CALCIUM 9.0 03/04/2011 0806   CALCIUM 9.0 03/04/2011 0806   ALKPHOS 136* 07/19/2011 0908   ALKPHOS 119* 03/04/2011 0806   ALKPHOS 119* 03/04/2011 0806   AST 13 07/19/2011 0908   AST 18 03/04/2011 0806   AST 18 03/04/2011 0806   ALT <8 07/19/2011 0908   BILITOT 0.4 07/19/2011 0908   BILITOT 0.40 03/04/2011 0806   BILITOT 0.40 03/04/2011 0806       RADIOGRAPHIC STUDIES:  Ct Chest W Contrast  07/19/2011  *RADIOLOGY REPORT*  Clinical Data:  Lung cancer.  Metastatic lymph nodes in the neck. Chemotherapy and radiation therapy ongoing.  Cannot  CT NECK, CHEST, ABDOMEN AND PELVIS WITH CONTRAST  Technique:  Multidetector CT imaging of the neck, chest, abdomen and pelvis was performed using the standard protocol following the bolus administration of intravenous contrast.  Contrast:  100 ml Omnipaque 300  Comparison:  PET CT scan 05/03/2011, neck CT 04/19/2011   CT NECK  Findings: There is considerable reduction in size of the bulky right cervical lymphadenopathy.  For example level IIA  lymph node anterior to the sternocleidomastoid (image 42) measures 8 mm compared to 15 mm on prior.  Lymph node located in the right carotid space measures 8 mm compared to 18 mm on prior (image 38).   Lymph node inferior to the parotid gland (image 29) measures 6 mm compared to 9 mm on prior. No new adenopathy in the right or left neck.  The pharyngeal mucosa is symmetric.  The submandibular glands are normal.  Parotid glands are normal.  Limited view of the inferior brain no aggressive osseous lesions.  IMPRESSION:  1.  Marked reduction in size of right cervical lymphadenopathy.  2.  No evidence of new adenopathy in the neck.  CT CHEST  Findings: No axillary or supraclavicular lymphadenopathy.  No mediastinal lymphadenopathy.  Small pericardial fluid is present. Right paratracheal lymph node measures 10  mm decreased from  19  mm on prior.  There is consolidation with air bronchogram in the left lower lobe adjacent to surgical clips which are similar to prior. No new pulmonary lesions.  Central emphysema is present.  IMPRESSION: 1.  Interval decrease in  right paratracheal lymphadenopathy.  No new adenopathy is present.  2.  Pericardial fluid is similar. 3.  Consolidation in the left lower lobe is unchanged from prior likely related to prior therapy.   CT ABDOMEN AND PELVIS  Findings: No focal hepatic lesion.  The gallbladder, pancreas, spleen, and adrenal glands are normal.  Kidneys are normal.  The stomach, small bowel, appendix are normal.  Colon is normal.  Abdominal aorta is normal caliber.  No retroperitoneal periportal lymphadenopathy.  No evidence of pelvic lymphadenopathy.  Bladder and uterus are normal.  No pelvic fluid. Review of  bone windows demonstrates a sclerotic lesion in the left iliac bone at prior site of prior lucent lesion.  IMPRESSION:  1.  No evidence metastasis in the soft tissues of the abdomen pelvis. 2.  Interval sclerosis of the left iliac bone metastasis.  Original Report Authenticated By: Genevive Bi, M.D.    ASSESSMENT: This is a very pleasant 61 years old and American female with recurrent non-small cell lung cancer. The patient is currently on systemic chemotherapy with  single agent Taxotere. She is tolerating her treatment fairly well and she has significant response to this treatment.  PLAN: I discussed the scan results with the patient and I personally reviewed the images. I recommended for her to continue on the same treatment regimen with Taxotere 75 mg/M2 every 2 weeks with Neulasta support. She will receive cycle #4 today. She was advised to call me immediately she has any concerning symptoms in the interval. The patient would come back for followup visit in 3 weeks with the start of cycle #5.  All questions were answered. The patient knows to call the clinic with any problems, questions or concerns. We can certainly see the patient much sooner if necessary.  I spent 20 minutes counseling the patient face to face. The total time spent in the appointment was 35 minutes.

## 2011-07-23 NOTE — Telephone Encounter (Signed)
gv pt appt schedule for march thru may.  °

## 2011-07-24 ENCOUNTER — Telehealth: Payer: Self-pay | Admitting: Medical Oncology

## 2011-07-24 ENCOUNTER — Ambulatory Visit (HOSPITAL_BASED_OUTPATIENT_CLINIC_OR_DEPARTMENT_OTHER)

## 2011-07-24 ENCOUNTER — Ambulatory Visit: Payer: Self-pay

## 2011-07-24 VITALS — BP 118/67 | HR 98 | Temp 96.9°F

## 2011-07-24 DIAGNOSIS — C3490 Malignant neoplasm of unspecified part of unspecified bronchus or lung: Secondary | ICD-10-CM

## 2011-07-24 DIAGNOSIS — C770 Secondary and unspecified malignant neoplasm of lymph nodes of head, face and neck: Secondary | ICD-10-CM

## 2011-07-24 MED ORDER — PEGFILGRASTIM INJECTION 6 MG/0.6ML
6.0000 mg | Freq: Once | SUBCUTANEOUS | Status: AC
  Administered 2011-07-24: 6 mg via SUBCUTANEOUS
  Filled 2011-07-24: qty 0.6

## 2011-07-24 NOTE — Telephone Encounter (Signed)
Requests wig for alopecia . Done and put in the injection room for pick up next week .

## 2011-07-30 ENCOUNTER — Other Ambulatory Visit (HOSPITAL_BASED_OUTPATIENT_CLINIC_OR_DEPARTMENT_OTHER): Admitting: Lab

## 2011-07-30 ENCOUNTER — Telehealth: Payer: Self-pay | Admitting: *Deleted

## 2011-07-30 DIAGNOSIS — C3490 Malignant neoplasm of unspecified part of unspecified bronchus or lung: Secondary | ICD-10-CM

## 2011-07-30 LAB — CBC WITH DIFFERENTIAL/PLATELET
Basophils Absolute: 0 10*3/uL (ref 0.0–0.1)
EOS%: 0.4 % (ref 0.0–7.0)
Eosinophils Absolute: 0.1 10*3/uL (ref 0.0–0.5)
HCT: 34.3 % — ABNORMAL LOW (ref 34.8–46.6)
HGB: 11.1 g/dL — ABNORMAL LOW (ref 11.6–15.9)
MCH: 29.5 pg (ref 25.1–34.0)
MCV: 91.2 fL (ref 79.5–101.0)
NEUT%: 80.5 % — ABNORMAL HIGH (ref 38.4–76.8)
lymph#: 1.1 10*3/uL (ref 0.9–3.3)

## 2011-07-30 LAB — COMPREHENSIVE METABOLIC PANEL
AST: 15 U/L (ref 0–37)
BUN: 12 mg/dL (ref 6–23)
Calcium: 9.5 mg/dL (ref 8.4–10.5)
Chloride: 103 mEq/L (ref 96–112)
Creatinine, Ser: 0.92 mg/dL (ref 0.50–1.10)
Glucose, Bld: 107 mg/dL — ABNORMAL HIGH (ref 70–99)

## 2011-07-30 NOTE — Telephone Encounter (Signed)
Pt called stating that she is noticing some changes with her nails.  They have developed a new ridge on them, no blackness noted.  Per Dr Donnald Garre, this is to be expected with Taxotere, no further orders at this time.  Pt verbalized understanding.  SLJ

## 2011-08-06 ENCOUNTER — Other Ambulatory Visit: Payer: Self-pay | Admitting: Lab

## 2011-08-07 ENCOUNTER — Telehealth: Payer: Self-pay | Admitting: Internal Medicine

## 2011-08-07 NOTE — Telephone Encounter (Signed)
Pt was ill on  4/9 and req to get her labs on the 4/16 appt.  Pt saw her other phy and was advised to stay in   aom.

## 2011-08-07 NOTE — Telephone Encounter (Signed)
pt had called 4/9 and l/m,called her back today to r/s    aom

## 2011-08-09 ENCOUNTER — Telehealth: Payer: Self-pay | Admitting: Medical Oncology

## 2011-08-09 NOTE — Telephone Encounter (Signed)
Pt reports she saw her primary MD this week and has "a virus"  and is now taking   Ampicillin  and Metoprolol .She cancelled her lab this week because she was sick and wants to know if she should cancel her chemo next week. I told her to keep her appointments next week and to call if anything changes.She voices understanding.

## 2011-08-12 ENCOUNTER — Telehealth: Payer: Self-pay | Admitting: Medical Oncology

## 2011-08-12 NOTE — Telephone Encounter (Signed)
Has 4 more days of antibiotics for ? Ear infection. Asking if she should take premeds for chemo scheduled for tomorrow. Per Dr. Donnald Garre I told pt to take pre-meds as ordered. She voices understanding.

## 2011-08-13 ENCOUNTER — Ambulatory Visit: Payer: Self-pay

## 2011-08-13 ENCOUNTER — Ambulatory Visit: Admitting: Internal Medicine

## 2011-08-13 ENCOUNTER — Ambulatory Visit (HOSPITAL_BASED_OUTPATIENT_CLINIC_OR_DEPARTMENT_OTHER)

## 2011-08-13 ENCOUNTER — Other Ambulatory Visit (HOSPITAL_BASED_OUTPATIENT_CLINIC_OR_DEPARTMENT_OTHER): Admitting: Lab

## 2011-08-13 VITALS — BP 123/69 | HR 117 | Temp 97.7°F | Ht 66.0 in | Wt 157.2 lb

## 2011-08-13 DIAGNOSIS — C770 Secondary and unspecified malignant neoplasm of lymph nodes of head, face and neck: Secondary | ICD-10-CM

## 2011-08-13 DIAGNOSIS — Z5111 Encounter for antineoplastic chemotherapy: Secondary | ICD-10-CM

## 2011-08-13 DIAGNOSIS — C3490 Malignant neoplasm of unspecified part of unspecified bronchus or lung: Secondary | ICD-10-CM

## 2011-08-13 LAB — COMPREHENSIVE METABOLIC PANEL
Albumin: 3.6 g/dL (ref 3.5–5.2)
BUN: 17 mg/dL (ref 6–23)
CO2: 22 mEq/L (ref 19–32)
Calcium: 9.7 mg/dL (ref 8.4–10.5)
Chloride: 101 mEq/L (ref 96–112)
Glucose, Bld: 154 mg/dL — ABNORMAL HIGH (ref 70–99)
Potassium: 4.2 mEq/L (ref 3.5–5.3)
Sodium: 136 mEq/L (ref 135–145)
Total Protein: 7 g/dL (ref 6.0–8.3)

## 2011-08-13 LAB — CBC WITH DIFFERENTIAL/PLATELET
Basophils Absolute: 0 10*3/uL (ref 0.0–0.1)
Eosinophils Absolute: 0 10*3/uL (ref 0.0–0.5)
HCT: 31.7 % — ABNORMAL LOW (ref 34.8–46.6)
HGB: 10.3 g/dL — ABNORMAL LOW (ref 11.6–15.9)
MCH: 29.2 pg (ref 25.1–34.0)
MONO#: 0.3 10*3/uL (ref 0.1–0.9)
NEUT#: 5.2 10*3/uL (ref 1.5–6.5)
NEUT%: 85.3 % — ABNORMAL HIGH (ref 38.4–76.8)
WBC: 6.1 10*3/uL (ref 3.9–10.3)
lymph#: 0.6 10*3/uL — ABNORMAL LOW (ref 0.9–3.3)

## 2011-08-13 MED ORDER — SODIUM CHLORIDE 0.9 % IV SOLN
Freq: Once | INTRAVENOUS | Status: AC
  Administered 2011-08-13: 10:00:00 via INTRAVENOUS

## 2011-08-13 MED ORDER — ONDANSETRON 8 MG/50ML IVPB (CHCC)
8.0000 mg | Freq: Once | INTRAVENOUS | Status: AC
  Administered 2011-08-13: 8 mg via INTRAVENOUS

## 2011-08-13 MED ORDER — DOCETAXEL CHEMO INJECTION 160 MG/16ML
75.0000 mg/m2 | Freq: Once | INTRAVENOUS | Status: AC
  Administered 2011-08-13: 140 mg via INTRAVENOUS
  Filled 2011-08-13: qty 14

## 2011-08-13 MED ORDER — DEXAMETHASONE SODIUM PHOSPHATE 10 MG/ML IJ SOLN
10.0000 mg | Freq: Once | INTRAMUSCULAR | Status: AC
  Administered 2011-08-13: 10 mg via INTRAVENOUS

## 2011-08-13 NOTE — Patient Instructions (Addendum)
[facility] Discharge Instructions for Patients Receiving Chemotherapy  Today you received the following chemotherapy agents Taxotere.  To help prevent nausea and vomiting after your treatment, we encourage you to take your nausea medication as ordered per MD.  If you develop nausea and vomiting that is not controlled by your nausea medication, call the clinic. If it is after clinic hours your family physician or the after hours number for the clinic or go to the Emergency Department.   BELOW ARE SYMPTOMS THAT SHOULD BE REPORTED IMMEDIATELY:  *FEVER GREATER THAN 100.5 F  *CHILLS WITH OR WITHOUT FEVER  NAUSEA AND VOMITING THAT IS NOT CONTROLLED WITH YOUR NAUSEA MEDICATION  *UNUSUAL SHORTNESS OF BREATH  *UNUSUAL BRUISING OR BLEEDING  TENDERNESS IN MOUTH AND THROAT WITH OR WITHOUT PRESENCE OF ULCERS  *URINARY PROBLEMS  *BOWEL PROBLEMS  UNUSUAL RASH Items with * indicate a potential emergency and should be followed up as soon as possible.   Please let the nurse know about any problems that you may have experienced. Feel free to call the clinic you have any questions or concerns. The clinic phone number is [phone].   I have been informed and understand all the instructions given to me. I know to contact the clinic, my physician, or go to the Emergency Department if any problems should occur. I do not have any questions at this time, but understand that I may call the clinic during office hours   should I have any questions or need assistance in obtaining follow up care.    __________________________________________  _____________  __________ Signature of Patient or Authorized Representative            Date                   Time    __________________________________________ Nurse's Signature    

## 2011-08-13 NOTE — Progress Notes (Signed)
[facility] Telephone:([phone]) [phone]   [phone]  OFFICE PROGRESS NOTE  Kaitlin Rodriguez, MD, MD [address] [place] [address]  DIAGNOSIS: Recurrent non-small cell lung cancer initially diagnosed as stage IIA (T1 M1 MX) adenocarcinoma in April 2011.   PRIOR THERAPY:  1. Status post 3 cycles of neoadjuvant chemotherapy with carboplatin and Alimta. Last dose was given July 23, 2009. 2. Status post left lower lobectomy with lymph node dissection under the care of Dr. Edwyna Shell on October 17, 2009. 3. Status post concurrent chemoradiation with weekly carboplatin and paclitaxel for disease recurrence. Last dose was given May 07, 2010. 4. Status post 3 cycles of consolidation chemotherapy with carboplatin and Alimta. Last dose was given August 13, 2010. 5. Status post palliative radiotherapy to the right neck area  CURRENT THERAPY: Systemic chemotherapy with single agent Taxotere 75 mg/m2 every 3 weeks,, status post 4 cycles.   INTERVAL HISTORY: Kaitlin Reed 61 y.o. female returns to the clinic today for followup visit. The patient is feeling fine today with no specific complaints. She had recent bronchitis last week and was treated with amoxicillin by her primary care physician. She denied having any current fever or chills.  She has no chest pain or shortness of breath. She tolerated the last cycle of her chemotherapy fairly well with no significant complaints except for one episode of vomiting 2 days after her chemotherapy.   MEDICAL HISTORY: Past Medical History  Diagnosis Date  . Anxiety   . Hypercholesteremia   . Heart murmur   . TIA (transient ischemic attack) 04/16/11  . Tubal pregnancy   . lung ca dx'd 06/2009    chemo comp 07/2010; xrt comp 06/2010    ALLERGIES:  is allergic to codeine; motrin; other; trazodone and nefazodone; banana; and tape.  MEDICATIONS:  Current Outpatient Prescriptions  Medication Sig Dispense Refill  .  amoxicillin (AMOXIL) 500 MG capsule Take 500 mg by mouth 3 (three) times daily.      . Ascorbic Acid (VITAMIN C) 1000 MG tablet Take 1,000 mg by mouth daily.      Marland Kitchen aspirin 81 MG tablet Take 81 mg by mouth daily.        . calcium carbonate (OS-CAL) 600 MG TABS Take 600 mg by mouth 2 (two) times daily with a meal.      . dexamethasone (DECADRON) 4 MG tablet Take 2 tablets by mouth twice a day, the day before, the day of and the day after chemotherapy  40 tablet  1  . emollient (BIAFINE) cream Apply topically as needed.      Marland Kitchen FOLIC ACID PO Take by mouth daily.        . IRON COMBINATIONS PO Take by mouth daily.        Marland Kitchen lidocaine (XYLOCAINE JELLY) 2 % jelly Apply topically as needed.  30 mL  0  . metoprolol tartrate (LOPRESSOR) 25 MG tablet Take 25 mg by mouth 2 (two) times daily.      . Multiple Vitamin (MULITIVITAMIN WITH MINERALS) TABS Take 1 tablet by mouth daily.        . ondansetron (ZOFRAN) 4 MG tablet Take 4 mg by mouth every 8 (eight) hours as needed.      . potassium chloride (MICRO-K) 10 MEQ CR capsule Take 5 mEq by mouth every other day.        Marland Kitchen VITAMIN D, CHOLECALCIFEROL, PO Take by mouth daily.        Marland Kitchen  DISCONTD: Alum & Mag Hydroxide-Simeth (MAGIC MOUTHWASH W/LIDOCAINE) SOLN Take 10 mLs by mouth 4 (four) times daily as needed.  500 mL  5    SURGICAL HISTORY:  Past Surgical History  Procedure Date  . Lung removal, partial 10/17/09    left  . 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   PHYSICAL EXAMINATION: General appearance: alert, cooperative and no distress Head: Normocephalic, without obvious abnormality, atraumatic Neck: no adenopathy Lymph nodes: Cervical, supraclavicular, and axillary nodes normal. Resp: clear to auscultation bilaterally Cardio: regular rate and rhythm, S1, S2 normal, no murmur, click, rub or gallop GI: soft, non-tender; bowel sounds normal; no masses,  no  organomegaly Extremities: extremities normal, atraumatic, no cyanosis or edema Neurologic: Alert and oriented X 3, normal strength and tone. Normal symmetric reflexes. Normal coordination and gait  ECOG PERFORMANCE STATUS: 1 - Symptomatic but completely ambulatory  Blood pressure 123/69, pulse 117, temperature 97.7 F (36.5 C), temperature source Oral, height 5\' 6"  (1.676 m), weight 157 lb 3.2 oz (71.305 kg).  LABORATORY DATA: Lab Results  Component Value Date   WBC 6.1 08/13/2011   HGB 10.3* 08/13/2011   HCT 31.7* 08/13/2011   MCV 89.8 08/13/2011   PLT 589* 08/13/2011      Chemistry      Component Value Date/Time   NA 138 07/30/2011 1559   NA 146* 03/04/2011 0806   NA 146* 03/04/2011 0806   K 3.3* 07/30/2011 1559   K 3.8 03/04/2011 0806   K 3.8 03/04/2011 0806   CL 103 07/30/2011 1559   CL 102 03/04/2011 0806   CL 102 03/04/2011 0806   CO2 28 07/30/2011 1559   CO2 32 03/04/2011 0806   CO2 32 03/04/2011 0806   BUN 12 07/30/2011 1559   BUN 12 03/04/2011 0806   BUN 12 03/04/2011 0806   CREATININE 0.92 07/30/2011 1559   CREATININE 0.9 03/04/2011 0806   CREATININE 0.9 03/04/2011 0806      Component Value Date/Time   CALCIUM 9.5 07/30/2011 1559   CALCIUM 9.0 03/04/2011 0806   CALCIUM 9.0 03/04/2011 0806   ALKPHOS 171* 07/30/2011 1559   ALKPHOS 119* 03/04/2011 0806   ALKPHOS 119* 03/04/2011 0806   AST 15 07/30/2011 1559   AST 18 03/04/2011 0806   AST 18 03/04/2011 0806   ALT 6 07/30/2011 1559   BILITOT 0.1* 07/30/2011 1559   BILITOT 0.40 03/04/2011 0806   BILITOT 0.40 03/04/2011 0806       RADIOGRAPHIC STUDIES:   ASSESSMENT: This is a very pleasant 61 years old African American female with recurrent non-small cell lung cancer currently on systemic chemotherapy with single agent Taxotere status post 4 cycles. The patient is tolerating her treatment fairly well  PLAN: We will proceed with cycle #5 today as scheduled. The patient would come back for followup visit in 3 weeks with the start of cycle #6 and  this would be followed by restaging scan of the neck, chest, abdomen and pelvis.  For nausea and vomiting she was advised to take Compazine on as needed basis. The patient was advised to call me immediately if she has any concerning symptoms in the interval.   All questions were answered. The patient knows to call the clinic with any problems, questions or concerns. We can certainly see the patient much sooner if necessary.  I spent 20 minutes counseling the patient face to face.  The total time spent in the appointment was 30 minutes.

## 2011-08-14 ENCOUNTER — Ambulatory Visit (HOSPITAL_BASED_OUTPATIENT_CLINIC_OR_DEPARTMENT_OTHER)

## 2011-08-14 ENCOUNTER — Telehealth: Payer: Self-pay | Admitting: Internal Medicine

## 2011-08-14 ENCOUNTER — Telehealth: Payer: Self-pay | Admitting: Medical Oncology

## 2011-08-14 ENCOUNTER — Ambulatory Visit: Payer: Self-pay

## 2011-08-14 VITALS — BP 115/71 | HR 81 | Temp 97.6°F

## 2011-08-14 DIAGNOSIS — C770 Secondary and unspecified malignant neoplasm of lymph nodes of head, face and neck: Secondary | ICD-10-CM

## 2011-08-14 DIAGNOSIS — C3490 Malignant neoplasm of unspecified part of unspecified bronchus or lung: Secondary | ICD-10-CM

## 2011-08-14 MED ORDER — PEGFILGRASTIM INJECTION 6 MG/0.6ML
6.0000 mg | Freq: Once | SUBCUTANEOUS | Status: AC
  Administered 2011-08-14: 6 mg via SUBCUTANEOUS
  Filled 2011-08-14: qty 0.6

## 2011-08-14 NOTE — Telephone Encounter (Signed)
Called pt to come in for her neulasta - I left message on mobile number.

## 2011-08-14 NOTE — Telephone Encounter (Signed)
s/w pt aware of 5/7 appt with mid  lev   aom

## 2011-08-14 NOTE — Patient Instructions (Signed)
Call MD for problems 

## 2011-08-20 ENCOUNTER — Other Ambulatory Visit (HOSPITAL_BASED_OUTPATIENT_CLINIC_OR_DEPARTMENT_OTHER): Admitting: Lab

## 2011-08-20 DIAGNOSIS — C3430 Malignant neoplasm of lower lobe, unspecified bronchus or lung: Secondary | ICD-10-CM

## 2011-08-20 DIAGNOSIS — C3490 Malignant neoplasm of unspecified part of unspecified bronchus or lung: Secondary | ICD-10-CM

## 2011-08-20 LAB — COMPREHENSIVE METABOLIC PANEL
ALT: 5 U/L (ref 0–35)
Albumin: 3.3 g/dL — ABNORMAL LOW (ref 3.5–5.2)
Alkaline Phosphatase: 142 U/L — ABNORMAL HIGH (ref 39–117)
Glucose, Bld: 104 mg/dL — ABNORMAL HIGH (ref 70–99)
Potassium: 3.6 mEq/L (ref 3.5–5.3)
Sodium: 136 mEq/L (ref 135–145)
Total Bilirubin: 0.1 mg/dL — ABNORMAL LOW (ref 0.3–1.2)
Total Protein: 6.5 g/dL (ref 6.0–8.3)

## 2011-08-20 LAB — CBC WITH DIFFERENTIAL/PLATELET
Basophils Absolute: 0 10*3/uL (ref 0.0–0.1)
EOS%: 0.4 % (ref 0.0–7.0)
Eosinophils Absolute: 0.1 10*3/uL (ref 0.0–0.5)
HGB: 10.1 g/dL — ABNORMAL LOW (ref 11.6–15.9)
MONO%: 9.8 % (ref 0.0–14.0)
NEUT#: 12.9 10*3/uL — ABNORMAL HIGH (ref 1.5–6.5)
RBC: 3.4 10*6/uL — ABNORMAL LOW (ref 3.70–5.45)
RDW: 15.9 % — ABNORMAL HIGH (ref 11.2–14.5)
lymph#: 1.3 10*3/uL (ref 0.9–3.3)
nRBC: 0 % (ref 0–0)

## 2011-08-27 ENCOUNTER — Other Ambulatory Visit (HOSPITAL_BASED_OUTPATIENT_CLINIC_OR_DEPARTMENT_OTHER): Admitting: Lab

## 2011-08-27 ENCOUNTER — Other Ambulatory Visit: Payer: Self-pay | Admitting: Internal Medicine

## 2011-08-27 DIAGNOSIS — C3490 Malignant neoplasm of unspecified part of unspecified bronchus or lung: Secondary | ICD-10-CM

## 2011-08-27 LAB — COMPREHENSIVE METABOLIC PANEL
ALT: 5 U/L (ref 0–35)
AST: 17 U/L (ref 0–37)
Albumin: 3.5 g/dL (ref 3.5–5.2)
Alkaline Phosphatase: 131 U/L — ABNORMAL HIGH (ref 39–117)
Glucose, Bld: 108 mg/dL — ABNORMAL HIGH (ref 70–99)
Potassium: 3.5 mEq/L (ref 3.5–5.3)
Sodium: 141 mEq/L (ref 135–145)
Total Bilirubin: 0.2 mg/dL — ABNORMAL LOW (ref 0.3–1.2)
Total Protein: 7.1 g/dL (ref 6.0–8.3)

## 2011-08-27 LAB — CBC WITH DIFFERENTIAL/PLATELET
BASO%: 0.3 % (ref 0.0–2.0)
EOS%: 0.3 % (ref 0.0–7.0)
LYMPH%: 21.1 % (ref 14.0–49.7)
MCH: 29.7 pg (ref 25.1–34.0)
MCHC: 32.2 g/dL (ref 31.5–36.0)
MCV: 92.3 fL (ref 79.5–101.0)
MONO%: 7.7 % (ref 0.0–14.0)
NEUT#: 4.2 10*3/uL (ref 1.5–6.5)
Platelets: 261 10*3/uL (ref 145–400)
RBC: 3.77 10*6/uL (ref 3.70–5.45)
RDW: 16 % — ABNORMAL HIGH (ref 11.2–14.5)

## 2011-09-03 ENCOUNTER — Ambulatory Visit: Payer: Self-pay | Admitting: Physician Assistant

## 2011-09-03 ENCOUNTER — Other Ambulatory Visit: Payer: Self-pay | Admitting: Lab

## 2011-09-03 ENCOUNTER — Ambulatory Visit: Payer: Self-pay

## 2011-09-04 ENCOUNTER — Ambulatory Visit (HOSPITAL_BASED_OUTPATIENT_CLINIC_OR_DEPARTMENT_OTHER)

## 2011-09-04 ENCOUNTER — Ambulatory Visit

## 2011-09-04 ENCOUNTER — Other Ambulatory Visit: Payer: Self-pay | Admitting: Internal Medicine

## 2011-09-04 ENCOUNTER — Ambulatory Visit (HOSPITAL_BASED_OUTPATIENT_CLINIC_OR_DEPARTMENT_OTHER): Payer: Self-pay | Admitting: Lab

## 2011-09-04 ENCOUNTER — Encounter: Payer: Self-pay | Admitting: Physician Assistant

## 2011-09-04 ENCOUNTER — Other Ambulatory Visit: Payer: Self-pay | Admitting: Medical Oncology

## 2011-09-04 ENCOUNTER — Ambulatory Visit (HOSPITAL_BASED_OUTPATIENT_CLINIC_OR_DEPARTMENT_OTHER): Admitting: Physician Assistant

## 2011-09-04 VITALS — BP 124/72 | HR 76 | Temp 97.8°F

## 2011-09-04 DIAGNOSIS — C3490 Malignant neoplasm of unspecified part of unspecified bronchus or lung: Secondary | ICD-10-CM

## 2011-09-04 DIAGNOSIS — Z5111 Encounter for antineoplastic chemotherapy: Secondary | ICD-10-CM

## 2011-09-04 DIAGNOSIS — C770 Secondary and unspecified malignant neoplasm of lymph nodes of head, face and neck: Secondary | ICD-10-CM

## 2011-09-04 DIAGNOSIS — C3430 Malignant neoplasm of lower lobe, unspecified bronchus or lung: Secondary | ICD-10-CM

## 2011-09-04 LAB — CBC WITH DIFFERENTIAL/PLATELET
EOS%: 0 % (ref 0.0–7.0)
Eosinophils Absolute: 0 10*3/uL (ref 0.0–0.5)
LYMPH%: 7.3 % — ABNORMAL LOW (ref 14.0–49.7)
MCH: 29.5 pg (ref 25.1–34.0)
MCV: 90.5 fL (ref 79.5–101.0)
MONO%: 5.4 % (ref 0.0–14.0)
NEUT#: 7.6 10*3/uL — ABNORMAL HIGH (ref 1.5–6.5)
Platelets: 392 10*3/uL (ref 145–400)
RBC: 3.7 10*6/uL (ref 3.70–5.45)

## 2011-09-04 LAB — COMPREHENSIVE METABOLIC PANEL
AST: 19 U/L (ref 0–37)
Alkaline Phosphatase: 100 U/L (ref 39–117)
BUN: 27 mg/dL — ABNORMAL HIGH (ref 6–23)
Glucose, Bld: 117 mg/dL — ABNORMAL HIGH (ref 70–99)
Sodium: 139 mEq/L (ref 135–145)
Total Bilirubin: 0.3 mg/dL (ref 0.3–1.2)

## 2011-09-04 MED ORDER — DEXAMETHASONE SODIUM PHOSPHATE 10 MG/ML IJ SOLN
10.0000 mg | Freq: Once | INTRAMUSCULAR | Status: AC
  Administered 2011-09-04: 10 mg via INTRAVENOUS

## 2011-09-04 MED ORDER — PEGFILGRASTIM INJECTION 6 MG/0.6ML
6.0000 mg | Freq: Once | SUBCUTANEOUS | Status: DC
  Filled 2011-09-04: qty 0.6

## 2011-09-04 MED ORDER — ONDANSETRON 8 MG/50ML IVPB (CHCC)
8.0000 mg | Freq: Once | INTRAVENOUS | Status: AC
  Administered 2011-09-04: 8 mg via INTRAVENOUS

## 2011-09-04 MED ORDER — HEPARIN SOD (PORK) LOCK FLUSH 100 UNIT/ML IV SOLN
500.0000 [IU] | Freq: Once | INTRAVENOUS | Status: DC | PRN
  Filled 2011-09-04: qty 5

## 2011-09-04 MED ORDER — DOCETAXEL CHEMO INJECTION 160 MG/16ML
75.0000 mg/m2 | Freq: Once | INTRAVENOUS | Status: AC
  Administered 2011-09-04: 140 mg via INTRAVENOUS
  Filled 2011-09-04: qty 14

## 2011-09-04 MED ORDER — SODIUM CHLORIDE 0.9 % IJ SOLN
10.0000 mL | INTRAMUSCULAR | Status: DC | PRN
  Filled 2011-09-04: qty 10

## 2011-09-04 MED ORDER — SODIUM CHLORIDE 0.9 % IV SOLN
Freq: Once | INTRAVENOUS | Status: AC
  Administered 2011-09-04: 11:00:00 via INTRAVENOUS

## 2011-09-04 NOTE — Patient Instructions (Signed)
[facility] Discharge Instructions for Patients Receiving Chemotherapy  Today you received the following chemotherapy agents Taxotere  To help prevent nausea and vomiting after your treatment, we encourage you to take your nausea medication  Begin taking it at 7 pm and take it as often as prescribed for the next 24 to 72 hours.   If you develop nausea and vomiting that is not controlled by your nausea medication, call the clinic. If it is after clinic hours your family physician or the after hours number for the clinic or go to the Emergency Department.   BELOW ARE SYMPTOMS THAT SHOULD BE REPORTED IMMEDIATELY:  *FEVER GREATER THAN 100.5 F  *CHILLS WITH OR WITHOUT FEVER  NAUSEA AND VOMITING THAT IS NOT CONTROLLED WITH YOUR NAUSEA MEDICATION  *UNUSUAL SHORTNESS OF BREATH  *UNUSUAL BRUISING OR BLEEDING  TENDERNESS IN MOUTH AND THROAT WITH OR WITHOUT PRESENCE OF ULCERS  *URINARY PROBLEMS  *BOWEL PROBLEMS  UNUSUAL RASH Items with * indicate a potential emergency and should be followed up as soon as possible.  One of the nurses will contact you 24 hours after your treatment. Please let the nurse know about any problems that you may have experienced. Feel free to call the clinic you have any questions or concerns. The clinic phone number is [phone].   I have been informed and understand all the instructions given to me. I know to contact the clinic, my physician, or go to the Emergency Department if any problems should occur. I do not have any questions at this time, but understand that I may call the clinic during office hours   should I have any questions or need assistance in obtaining follow up care.    __________________________________________  _____________  __________ Signature of Patient or Authorized Representative            Date                   Time    __________________________________________ Nurse's Signature    

## 2011-09-05 ENCOUNTER — Telehealth: Payer: Self-pay | Admitting: *Deleted

## 2011-09-05 ENCOUNTER — Ambulatory Visit (HOSPITAL_BASED_OUTPATIENT_CLINIC_OR_DEPARTMENT_OTHER)

## 2011-09-05 VITALS — BP 122/67 | HR 95 | Temp 98.0°F

## 2011-09-05 DIAGNOSIS — C770 Secondary and unspecified malignant neoplasm of lymph nodes of head, face and neck: Secondary | ICD-10-CM

## 2011-09-05 DIAGNOSIS — C3490 Malignant neoplasm of unspecified part of unspecified bronchus or lung: Secondary | ICD-10-CM

## 2011-09-05 DIAGNOSIS — Z5189 Encounter for other specified aftercare: Secondary | ICD-10-CM

## 2011-09-05 DIAGNOSIS — C3430 Malignant neoplasm of lower lobe, unspecified bronchus or lung: Secondary | ICD-10-CM

## 2011-09-05 MED ORDER — PEGFILGRASTIM INJECTION 6 MG/0.6ML
6.0000 mg | Freq: Once | SUBCUTANEOUS | Status: AC
  Administered 2011-09-05: 6 mg via SUBCUTANEOUS
  Filled 2011-09-05: qty 0.6

## 2011-09-05 NOTE — Patient Instructions (Signed)
Call MD with any problems 

## 2011-09-05 NOTE — Telephone Encounter (Signed)
Per staff message from [place], [place] have moved treatment appt from 5/28 to 5/29.  JMW

## 2011-09-06 ENCOUNTER — Telehealth: Payer: Self-pay | Admitting: Internal Medicine

## 2011-09-06 NOTE — Telephone Encounter (Signed)
called pt to provided appts for may along with ct scan appt and pt stated that she will p/u appts on 05/14.  gv info to [place] to gv to pt

## 2011-09-06 NOTE — Progress Notes (Signed)
[facility] [phone]   [phone]  OFFICE PROGRESS NOTE  [name], MD, MD [address] [place] [address]  DIAGNOSIS: Recurrent non-small cell lung cancer initially diagnosed as stage IIA (T1 M1 MX) adenocarcinoma in April 2011.   PRIOR THERAPY:  1. Status post 3 cycles of neoadjuvant chemotherapy with carboplatin and Alimta. Last dose was given July 23, 2009. 2. Status post left lower lobectomy with lymph node dissection under the care of Dr. Edwyna Shell on October 17, 2009. 3. Status post concurrent chemoradiation with weekly carboplatin and paclitaxel for disease recurrence. Last dose was given May 07, 2010. 4. Status post 3 cycles of consolidation chemotherapy with carboplatin and Alimta. Last dose was given August 13, 2010. 5. Status post palliative radiotherapy to the right neck area  CURRENT THERAPY: Systemic chemotherapy with single agent Taxotere 75 mg/m2 every 3 weeks,, status post 5 cycles.   INTERVAL HISTORY: Kaitlin Reed 61 y.o. female returns to the clinic today for followup visit. The patient is feeling fine today with no specific complaints.  She denied having any current fever or chills.  She has no chest pain or shortness of breath, no cough or hemoptysis. Overall she's tolerating her chemotherapy relatively well.   MEDICAL HISTORY: Past Medical History  Diagnosis Date  . Anxiety   . Hypercholesteremia   . Heart murmur   . TIA (transient ischemic attack) 04/16/11  . Tubal pregnancy   . lung ca dx'd 06/2009    chemo comp 07/2010; xrt comp 06/2010    ALLERGIES:  is allergic to codeine; motrin; other; trazodone and nefazodone; banana; and tape.  MEDICATIONS:  Current Outpatient Prescriptions  Medication Sig Dispense Refill  . amoxicillin (AMOXIL) 500 MG capsule Take 500 mg by mouth 3 (three) times daily.      . Ascorbic Acid (VITAMIN C) 1000 MG tablet Take 1,000 mg by mouth daily.      Marland Kitchen aspirin 81 MG  tablet Take 81 mg by mouth daily.        . calcium carbonate (OS-CAL) 600 MG TABS Take 600 mg by mouth 2 (two) times daily with a meal.      . dexamethasone (DECADRON) 4 MG tablet Take 2 tablets by mouth twice a day, the day before, the day of and the day after chemotherapy  40 tablet  1  . emollient (BIAFINE) cream Apply topically as needed.      . folic acid (FOLVITE) 1 MG tablet TAKE 1 TABLET EVERY DAY  30 tablet  3  . FOLIC ACID PO Take by mouth daily.        . IRON COMBINATIONS PO Take by mouth daily.        Marland Kitchen lidocaine (XYLOCAINE JELLY) 2 % jelly Apply topically as needed.  30 mL  0  . metoprolol tartrate (LOPRESSOR) 25 MG tablet Take 25 mg by mouth 2 (two) times daily.      . Multiple Vitamin (MULITIVITAMIN WITH MINERALS) TABS Take 1 tablet by mouth daily.        . ondansetron (ZOFRAN) 4 MG tablet Take 4 mg by mouth every 8 (eight) hours as needed.      . potassium chloride (MICRO-K) 10 MEQ CR capsule Take 5 mEq by mouth every other day.        Marland Kitchen VITAMIN D, CHOLECALCIFEROL, PO Take by mouth daily.        Marland Kitchen DISCONTD: Alum & Mag Hydroxide-Simeth (MAGIC MOUTHWASH W/LIDOCAINE) SOLN  Take 10 mLs by mouth 4 (four) times daily as needed.  500 mL  5   Current Facility-Administered Medications  Medication Dose Route Frequency Provider Last Rate Last Dose  . pegfilgrastim (NEULASTA) injection 6 mg  6 mg Subcutaneous Once Conni Slipper, PA        SURGICAL HISTORY:  Past Surgical History  Procedure Date  . Lung removal, partial 10/17/09    left  . 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   PHYSICAL EXAMINATION: General appearance: alert, cooperative and no distress Head: Normocephalic, without obvious abnormality, atraumatic Neck: no adenopathy Lymph nodes: Cervical, supraclavicular, and axillary nodes normal. Resp: clear to auscultation bilaterally Cardio: regular rate and rhythm, S1, S2 normal, no  murmur, click, rub or gallop GI: soft, non-tender; bowel sounds normal; no masses,  no organomegaly Extremities: extremities normal, atraumatic, no cyanosis or edema Neurologic: Alert and oriented X 3, normal strength and tone. Normal symmetric reflexes. Normal coordination and gait  ECOG PERFORMANCE STATUS: 1 - Symptomatic but completely ambulatory  There were no vitals taken for this visit.  LABORATORY DATA: Lab Results  Component Value Date   WBC 8.7 09/04/2011   HGB 10.9* 09/04/2011   HCT 33.5* 09/04/2011   MCV 90.5 09/04/2011   PLT 392 09/04/2011      Chemistry      Component Value Date/Time   NA 139 09/04/2011 1014   NA 146* 03/04/2011 0806   NA 146* 03/04/2011 0806   K 4.0 09/04/2011 1014   K 3.8 03/04/2011 0806   K 3.8 03/04/2011 0806   CL 105 09/04/2011 1014   CL 102 03/04/2011 0806   CL 102 03/04/2011 0806   CO2 26 09/04/2011 1014   CO2 32 03/04/2011 0806   CO2 32 03/04/2011 0806   BUN 27* 09/04/2011 1014   BUN 12 03/04/2011 0806   BUN 12 03/04/2011 0806   CREATININE 0.89 09/04/2011 1014   CREATININE 0.9 03/04/2011 0806   CREATININE 0.9 03/04/2011 0806      Component Value Date/Time   CALCIUM 9.6 09/04/2011 1014   CALCIUM 9.0 03/04/2011 0806   CALCIUM 9.0 03/04/2011 0806   ALKPHOS 100 09/04/2011 1014   ALKPHOS 119* 03/04/2011 0806   ALKPHOS 119* 03/04/2011 0806   AST 19 09/04/2011 1014   AST 18 03/04/2011 0806   AST 18 03/04/2011 0806   ALT 8 09/04/2011 1014   BILITOT 0.3 09/04/2011 1014   BILITOT 0.40 03/04/2011 0806   BILITOT 0.40 03/04/2011 0806       RADIOGRAPHIC STUDIES:   ASSESSMENT/PLAN: This is a very pleasant 61 years old African American female with recurrent non-small cell lung cancer currently on systemic chemotherapy with single agent Taxotere status post 4 cycles. The patient is tolerating her treatment fairly well. The patient was discussed with Dr. Arbutus Ped. She'll proceed with cycle #6 of her systemic chemotherapy as scheduled today. She'll followup with Dr. Gwenyth Bouillon in 3 weeks  with repeat CBC differential C. met and CT of the chest abdomen and pelvis with contrast to reevaluate her disease.  Laural Benes, Adonijah Baena E, PA-C   All questions were answered. The patient knows to call the clinic with any problems, questions or concerns. We can certainly see the patient much sooner if necessary.

## 2011-09-10 ENCOUNTER — Other Ambulatory Visit (HOSPITAL_BASED_OUTPATIENT_CLINIC_OR_DEPARTMENT_OTHER): Payer: Self-pay

## 2011-09-10 ENCOUNTER — Other Ambulatory Visit: Payer: Self-pay | Admitting: *Deleted

## 2011-09-10 DIAGNOSIS — C3430 Malignant neoplasm of lower lobe, unspecified bronchus or lung: Secondary | ICD-10-CM

## 2011-09-10 DIAGNOSIS — B370 Candidal stomatitis: Secondary | ICD-10-CM

## 2011-09-10 DIAGNOSIS — C3490 Malignant neoplasm of unspecified part of unspecified bronchus or lung: Secondary | ICD-10-CM

## 2011-09-10 LAB — COMPREHENSIVE METABOLIC PANEL
BUN: 12 mg/dL (ref 6–23)
CO2: 28 mEq/L (ref 19–32)
Creatinine, Ser: 0.77 mg/dL (ref 0.50–1.10)
Glucose, Bld: 92 mg/dL (ref 70–99)
Total Bilirubin: 0.2 mg/dL — ABNORMAL LOW (ref 0.3–1.2)

## 2011-09-10 LAB — CBC WITH DIFFERENTIAL/PLATELET
Basophils Absolute: 0 10*3/uL (ref 0.0–0.1)
Eosinophils Absolute: 0.1 10*3/uL (ref 0.0–0.5)
HGB: 12 g/dL (ref 11.6–15.9)
LYMPH%: 15.2 % (ref 14.0–49.7)
MCV: 93.4 fL (ref 79.5–101.0)
MONO%: 10.7 % (ref 0.0–14.0)
NEUT#: 5.9 10*3/uL (ref 1.5–6.5)
Platelets: 242 10*3/uL (ref 145–400)
RBC: 4 10*6/uL (ref 3.70–5.45)

## 2011-09-10 MED ORDER — FLUCONAZOLE 100 MG PO TABS: ORAL_TABLET | ORAL | Status: DC

## 2011-09-10 NOTE — Progress Notes (Signed)
Pt called stating that she needs to talk to an RN when she gets labs today because she is dehydrated and has white on her tongue.  Per Dr Donnald Garre, okay to give diflucan for thrush and she will get 1 L IVF tomorrow.  Pt verbalized understanding. SLJ

## 2011-09-11 ENCOUNTER — Ambulatory Visit (HOSPITAL_BASED_OUTPATIENT_CLINIC_OR_DEPARTMENT_OTHER)

## 2011-09-11 VITALS — BP 116/72 | HR 89 | Temp 98.4°F

## 2011-09-11 DIAGNOSIS — C3490 Malignant neoplasm of unspecified part of unspecified bronchus or lung: Secondary | ICD-10-CM

## 2011-09-11 MED ORDER — SODIUM CHLORIDE 0.9 % IV SOLN
1000.0000 mL | Freq: Once | INTRAVENOUS | Status: AC
  Administered 2011-09-11: 1000 mL via INTRAVENOUS

## 2011-09-11 NOTE — Progress Notes (Signed)
Patient discharged home with no complaints; post vs stable; no signs of unsteady gait; aware of next appointment.

## 2011-09-17 ENCOUNTER — Other Ambulatory Visit (HOSPITAL_BASED_OUTPATIENT_CLINIC_OR_DEPARTMENT_OTHER): Admitting: Lab

## 2011-09-17 DIAGNOSIS — Z23 Encounter for immunization: Secondary | ICD-10-CM

## 2011-09-17 LAB — COMPREHENSIVE METABOLIC PANEL
ALT: 6 U/L (ref 0–35)
Albumin: 3.5 g/dL (ref 3.5–5.2)
CO2: 29 mEq/L (ref 19–32)
Calcium: 8.9 mg/dL (ref 8.4–10.5)
Chloride: 103 mEq/L (ref 96–112)
Creatinine, Ser: 0.89 mg/dL (ref 0.50–1.10)
Potassium: 3.4 mEq/L — ABNORMAL LOW (ref 3.5–5.3)

## 2011-09-17 LAB — CBC WITH DIFFERENTIAL/PLATELET
Eosinophils Absolute: 0 10*3/uL (ref 0.0–0.5)
HCT: 34.7 % — ABNORMAL LOW (ref 34.8–46.6)
LYMPH%: 18 % (ref 14.0–49.7)
MONO#: 0.5 10*3/uL (ref 0.1–0.9)
NEUT#: 3.7 10*3/uL (ref 1.5–6.5)
NEUT%: 71.9 % (ref 38.4–76.8)
Platelets: 283 10*3/uL (ref 145–400)
RBC: 3.74 10*6/uL (ref 3.70–5.45)
WBC: 5.2 10*3/uL (ref 3.9–10.3)
nRBC: 0 % (ref 0–0)

## 2011-09-20 ENCOUNTER — Ambulatory Visit (HOSPITAL_COMMUNITY)
Admission: RE | Admit: 2011-09-20 | Discharge: 2011-09-20 | Disposition: A | Discharge status: Home / Self Care | Source: Ambulatory Visit | Attending: Physician Assistant | Admitting: Physician Assistant

## 2011-09-20 DIAGNOSIS — K5730 Diverticulosis of large intestine without perforation or abscess without bleeding: Secondary | ICD-10-CM | POA: Insufficient documentation

## 2011-09-20 DIAGNOSIS — I319 Disease of pericardium, unspecified: Secondary | ICD-10-CM | POA: Insufficient documentation

## 2011-09-20 DIAGNOSIS — I700 Atherosclerosis of aorta: Secondary | ICD-10-CM | POA: Insufficient documentation

## 2011-09-20 DIAGNOSIS — C3490 Malignant neoplasm of unspecified part of unspecified bronchus or lung: Secondary | ICD-10-CM | POA: Insufficient documentation

## 2011-09-20 DIAGNOSIS — I2510 Atherosclerotic heart disease of native coronary artery without angina pectoris: Secondary | ICD-10-CM | POA: Insufficient documentation

## 2011-09-20 MED ORDER — IOHEXOL 300 MG/ML  SOLN
100.0000 mL | Freq: Once | INTRAMUSCULAR | Status: AC | PRN
  Administered 2011-09-20: 100 mL via INTRAVENOUS

## 2011-09-24 ENCOUNTER — Other Ambulatory Visit: Payer: Self-pay | Admitting: Lab

## 2011-09-24 ENCOUNTER — Ambulatory Visit: Payer: Self-pay

## 2011-09-25 ENCOUNTER — Telehealth: Payer: Self-pay | Admitting: Internal Medicine

## 2011-09-25 ENCOUNTER — Ambulatory Visit: Payer: Self-pay

## 2011-09-25 ENCOUNTER — Other Ambulatory Visit: Admitting: Lab

## 2011-09-25 ENCOUNTER — Ambulatory Visit (HOSPITAL_BASED_OUTPATIENT_CLINIC_OR_DEPARTMENT_OTHER): Admitting: Internal Medicine

## 2011-09-25 VITALS — BP 130/75 | HR 106 | Temp 96.8°F | Ht 66.0 in | Wt 163.0 lb

## 2011-09-25 DIAGNOSIS — C3490 Malignant neoplasm of unspecified part of unspecified bronchus or lung: Secondary | ICD-10-CM

## 2011-09-25 LAB — CBC WITH DIFFERENTIAL/PLATELET
Eosinophils Absolute: 0.1 10*3/uL (ref 0.0–0.5)
HCT: 35.3 % (ref 34.8–46.6)
LYMPH%: 32.1 % (ref 14.0–49.7)
MCV: 91 fL (ref 79.5–101.0)
MONO%: 15.2 % — ABNORMAL HIGH (ref 0.0–14.0)
NEUT#: 2.2 10*3/uL (ref 1.5–6.5)
NEUT%: 51 % (ref 38.4–76.8)
Platelets: 320 10*3/uL (ref 145–400)
RBC: 3.88 10*6/uL (ref 3.70–5.45)
nRBC: 0 % (ref 0–0)

## 2011-09-25 NOTE — Progress Notes (Signed)
[facility] [phone]   [phone]  OFFICE PROGRESS NOTE  [name], MD, MD [address] [place] [address]  DIAGNOSIS: Recurrent non-small cell lung cancer initially diagnosed as stage IIA (T1 M1 MX) adenocarcinoma in April 2011.   PRIOR THERAPY:  1. Status post 3 cycles of neoadjuvant chemotherapy with carboplatin and Alimta. Last dose was given July 23, 2009. 2. Status post left lower lobectomy with lymph node dissection under the care of Dr. Edwyna Shell on October 17, 2009. 3. Status post concurrent chemoradiation with weekly carboplatin and paclitaxel for disease recurrence. Last dose was given May 07, 2010. 4. Status post 3 cycles of consolidation chemotherapy with carboplatin and Alimta. Last dose was given August 13, 2010. 5. Status post palliative radiotherapy to the right neck area. 6. Systemic chemotherapy with single agent Taxotere 75 mg/m2 every 3 weeks,, status post 6 cycles.  CURRENT THERAPY: Systemic chemotherapy with single agent Taxotere 75 mg/m2 every 3 weeks,, status post 5 cycles.   CODE STATUS: Full code.  INTERVAL HISTORY: KHALIS HITTLE 61 y.o. female returns to the clinic today for followup visit. The patient tolerated the last cycle of her chemotherapy fairly well except for mild peripheral neuropathy involving the fingers and toes. She also had occasional nausea and mild fatigue. She denied having any significant chest pain or shortness breath, no cough or hemoptysis. She has no significant weight loss. The patient has repeat CT scan of the chest, abdomen and pelvis performed recently and she is here today for evaluation and discussion of her scan results.  MEDICAL HISTORY: Past Medical History  Diagnosis Date  . Anxiety   . Hypercholesteremia   . Heart murmur   . TIA (transient ischemic attack) 04/16/11  . Tubal pregnancy   . lung ca dx'd 06/2009    chemo comp 07/2010; xrt comp 06/2010    ALLERGIES:   is allergic to codeine; motrin; other; trazodone and nefazodone; banana; and tape.  MEDICATIONS:  Current Outpatient Prescriptions  Medication Sig Dispense Refill  . Ascorbic Acid (VITAMIN C) 1000 MG tablet Take 1,000 mg by mouth daily.      Marland Kitchen aspirin 81 MG tablet Take 81 mg by mouth daily.        . calcium carbonate (OS-CAL) 600 MG TABS Take 600 mg by mouth 2 (two) times daily with a meal.      . dexamethasone (DECADRON) 4 MG tablet Take 2 tablets by mouth twice a day, the day before, the day of and the day after chemotherapy  40 tablet  1  . fluconazole (DIFLUCAN) 100 MG tablet take one table by mouth daily until completed  10 tablet  0  . folic acid (FOLVITE) 1 MG tablet TAKE 1 TABLET EVERY DAY  30 tablet  3  . FOLIC ACID PO Take by mouth daily.        . IRON COMBINATIONS PO Take by mouth daily.        . metoprolol tartrate (LOPRESSOR) 25 MG tablet Take 25 mg by mouth 2 (two) times daily.      . Multiple Vitamin (MULITIVITAMIN WITH MINERALS) TABS Take 1 tablet by mouth daily.        . ondansetron (ZOFRAN) 4 MG tablet Take 4 mg by mouth every 8 (eight) hours as needed.      . potassium chloride (MICRO-K) 10 MEQ CR capsule Take 5 mEq by mouth every other day.        Marland Kitchen  VITAMIN D, CHOLECALCIFEROL, PO Take by mouth daily.        Marland Kitchen amoxicillin (AMOXIL) 500 MG capsule Take 500 mg by mouth 3 (three) times daily.      Marland Kitchen emollient (BIAFINE) cream Apply topically as needed.      . lidocaine (XYLOCAINE JELLY) 2 % jelly Apply topically as needed.  30 mL  0  . DISCONTD: Alum & Mag Hydroxide-Simeth (MAGIC MOUTHWASH W/LIDOCAINE) SOLN Take 10 mLs by mouth 4 (four) times daily as needed.  500 mL  5    SURGICAL HISTORY:  Past Surgical History  Procedure Date  . Lung removal, partial 10/17/09    left  . 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 Resp: clear to auscultation bilaterally Cardio: regular rate and rhythm, S1, S2 normal, no murmur, click, rub or gallop GI: soft, non-tender; bowel sounds normal; no masses,  no organomegaly Extremities: extremities normal, atraumatic, no cyanosis or edema Neurologic: Alert and oriented X 3, normal strength and tone. Normal symmetric reflexes. Normal coordination and gait  ECOG PERFORMANCE STATUS: 1 - Symptomatic but completely ambulatory  Blood pressure 130/75, pulse 106, temperature 96.8 F (36 C), temperature source Oral, height 5\' 6"  (1.676 m), weight 163 lb (73.936 kg).  LABORATORY DATA: Lab Results  Component Value Date   WBC 4.3 09/25/2011   HGB 11.6 09/25/2011   HCT 35.3 09/25/2011   MCV 91.0 09/25/2011   PLT 320 09/25/2011      Chemistry      Component Value Date/Time   NA 139 09/17/2011 1607   NA 146* 03/04/2011 0806   NA 146* 03/04/2011 0806   K 3.4* 09/17/2011 1607   K 3.8 03/04/2011 0806   K 3.8 03/04/2011 0806   CL 103 09/17/2011 1607   CL 102 03/04/2011 0806   CL 102 03/04/2011 0806   CO2 29 09/17/2011 1607   CO2 32 03/04/2011 0806   CO2 32 03/04/2011 0806   BUN 12 09/17/2011 1607   BUN 12 03/04/2011 0806   BUN 12 03/04/2011 0806   CREATININE 0.89 09/17/2011 1607   CREATININE 0.9 03/04/2011 0806   CREATININE 0.9 03/04/2011 0806      Component Value Date/Time   CALCIUM 8.9 09/17/2011 1607   CALCIUM 9.0 03/04/2011 0806   CALCIUM 9.0 03/04/2011 0806   ALKPHOS 128* 09/17/2011 1607   ALKPHOS 119* 03/04/2011 0806   ALKPHOS 119* 03/04/2011 0806   AST 16 09/17/2011 1607   AST 18 03/04/2011 0806   AST 18 03/04/2011 0806   ALT 6 09/17/2011 1607   BILITOT 0.2* 09/17/2011 1607   BILITOT 0.40 03/04/2011 0806   BILITOT 0.40 03/04/2011 0806        RADIOGRAPHIC STUDIES: Ct Chest W Contrast  09/20/2011  *RADIOLOGY REPORT*  Clinical Data:  Lung cancer post resection with chemotherapy and radiation therapy ongoing, history of metastatic disease to right neck lymph nodes  CT CHEST, ABDOMEN AND PELVIS WITH CONTRAST  Technique:  Multidetector CT imaging of the chest, abdomen and pelvis was performed following the standard protocol during bolus administration of intravenous contrast.  Sagittal and coronal MPR images reconstructed from axial data set.  Contrast: OMNIPAQUE IOHEXOL 300  MG/ML SOLN. Dilute oral contrast.  Comparison:  07/19/2011   CT CHEST  Findings: Scattered atherosclerotic calcifications aorta and coronary arteries. Persistent small pericardial effusion. Right paratracheal node 11 x 10 mm image 17 unchanged. No additional thoracic adenopathy; dedicated CT neck imaging not performed. No right supraclavicular adenopathy is visualized. Questionable wall thickening of mid thoracic esophagus images 24 - 31. Postsurgical changes from left lower lobe resection with residual infiltrate/consolidation in mid left lung posteriorly. Emphysematous changes with paramediastinal infiltrate in right upper lobe, question related to radiation therapy, increased since previous exam. No new infiltrate, pleural effusion or pneumothorax. No recurrent pulmonary mass or nodule. No acute osseous findings.  IMPRESSION: Stable size of a minimally prominent right paratracheal lymph node. Persistent pericardial effusion. Persistent post-therapy changes in the mid-to-lower left hemithorax and suspect at medial right upper lobe. Question esophageal wall thickening at mid thoracic esophagus, could potentially be related to radiation therapy, recommend correlation with radiation port and patient's symptoms; if this requires further evaluation consider esophagram or endoscopy.   CT ABDOMEN AND PELVIS  Findings: Liver, spleen, pancreas, kidneys, and adrenal glands  normal appearance. Normal appendix.  Scattered atherosclerotic calcifications without aneurysm. Decompressed bladder. Scattered uterine calcifications with unremarkable adnexae. Mild diverticulosis of the sigmoid colon with incomplete sigmoid distention making assessment of wall thickness suboptimal. Stomach and bowel loops otherwise normal appearance. No mass, adenopathy, free fluid, or inflammatory process. Sclerotic focus left ilium unchanged. No new bony lesions.  IMPRESSION: Mild diverticulosis of sigmoid colon. No acute intra abdominal findings to suggest metastatic disease. Stable sclerotic focus in left ilium consistent with metastasis.  Original Report Authenticated By: Lollie Marrow, M.D.    ASSESSMENT: This is a very pleasant 61 years old [place] American female with recurrent non-small cell lung cancer most recently treated with 6 cycles of single agent Taxotere with stable disease.   PLAN: I discussed the scan results with the patient and give her the option between continuing with the same treatment versus taking a break and observation. The patient has peripheral neuropathy and fatigue from her systemic chemotherapy and she preferred to have a break from treatment. I would see her back for followup visit in 3 months with repeat CT scan of the chest, abdomen and pelvis for reevaluation of her disease. She was advised to call me immediately if she has any concerning symptoms in the interval.  All questions were answered. The patient knows to call the clinic with any problems, questions or concerns. We can certainly see the patient much sooner if necessary.  I spent 20 minutes counseling the patient face to face. The total time spent in the appointment was 30 minutes.

## 2011-09-25 NOTE — Progress Notes (Signed)
Encounter addended by: Glennie Hawk, RN on: 09/25/2011  2:05 PM<BR>     Documentation filed: Charges VN

## 2011-09-25 NOTE — Telephone Encounter (Signed)
appts made and printed for pt aom °

## 2011-09-26 ENCOUNTER — Ambulatory Visit: Payer: Self-pay

## 2011-10-28 ENCOUNTER — Other Ambulatory Visit: Payer: Self-pay | Admitting: *Deleted

## 2011-10-28 DIAGNOSIS — C3490 Malignant neoplasm of unspecified part of unspecified bronchus or lung: Secondary | ICD-10-CM

## 2011-10-28 NOTE — Progress Notes (Signed)
Pt called stating that she has a new place on the right side of her neck that she noticed on Friday.  It is right about her collarbone and describes it as "puffy".  Per Dr Donnald Garre, pt needs to have CT neck and chest and f/u within one week.  SLJ

## 2011-11-01 ENCOUNTER — Telehealth: Payer: Self-pay | Admitting: Internal Medicine

## 2011-11-01 NOTE — Telephone Encounter (Signed)
s/w pt and she is aware of 7/10 7/11   appts  aom

## 2011-11-06 ENCOUNTER — Ambulatory Visit (HOSPITAL_COMMUNITY)
Admission: RE | Admit: 2011-11-06 | Discharge: 2011-11-06 | Disposition: A | Discharge status: Home / Self Care | Source: Ambulatory Visit | Attending: Internal Medicine | Admitting: Internal Medicine

## 2011-11-06 ENCOUNTER — Encounter (HOSPITAL_COMMUNITY): Payer: Self-pay

## 2011-11-06 ENCOUNTER — Other Ambulatory Visit (HOSPITAL_BASED_OUTPATIENT_CLINIC_OR_DEPARTMENT_OTHER): Admitting: Lab

## 2011-11-06 DIAGNOSIS — C3490 Malignant neoplasm of unspecified part of unspecified bronchus or lung: Secondary | ICD-10-CM

## 2011-11-06 DIAGNOSIS — I319 Disease of pericardium, unspecified: Secondary | ICD-10-CM | POA: Insufficient documentation

## 2011-11-06 DIAGNOSIS — J984 Other disorders of lung: Secondary | ICD-10-CM | POA: Insufficient documentation

## 2011-11-06 DIAGNOSIS — R220 Localized swelling, mass and lump, head: Secondary | ICD-10-CM | POA: Insufficient documentation

## 2011-11-06 DIAGNOSIS — R599 Enlarged lymph nodes, unspecified: Secondary | ICD-10-CM | POA: Insufficient documentation

## 2011-11-06 LAB — CMP (CANCER CENTER ONLY)
ALT(SGPT): 23 U/L (ref 10–47)
AST: 36 U/L (ref 11–38)
BUN, Bld: 15 mg/dL (ref 7–22)
Creat: 0.9 mg/dl (ref 0.6–1.2)
Total Bilirubin: 0.5 mg/dl (ref 0.20–1.60)

## 2011-11-06 LAB — CBC WITH DIFFERENTIAL/PLATELET
BASO%: 0.3 % (ref 0.0–2.0)
Basophils Absolute: 0 10*3/uL (ref 0.0–0.1)
EOS%: 2.5 % (ref 0.0–7.0)
HCT: 40.6 % (ref 34.8–46.6)
LYMPH%: 38 % (ref 14.0–49.7)
MCH: 30.2 pg (ref 25.1–34.0)
MCHC: 33.2 g/dL (ref 31.5–36.0)
MCV: 90.9 fL (ref 79.5–101.0)
MONO%: 10 % (ref 0.0–14.0)
NEUT%: 49.2 % (ref 38.4–76.8)
Platelets: 303 10*3/uL (ref 145–400)
lymph#: 1.3 10*3/uL (ref 0.9–3.3)

## 2011-11-06 MED ORDER — IOHEXOL 300 MG/ML  SOLN
100.0000 mL | Freq: Once | INTRAMUSCULAR | Status: AC | PRN
  Administered 2011-11-06: 100 mL via INTRAVENOUS

## 2011-11-07 ENCOUNTER — Telehealth: Payer: Self-pay | Admitting: Internal Medicine

## 2011-11-07 ENCOUNTER — Ambulatory Visit (HOSPITAL_BASED_OUTPATIENT_CLINIC_OR_DEPARTMENT_OTHER): Admitting: Internal Medicine

## 2011-11-07 VITALS — BP 128/69 | HR 84 | Temp 97.1°F | Ht 66.0 in | Wt 157.9 lb

## 2011-11-07 DIAGNOSIS — C3490 Malignant neoplasm of unspecified part of unspecified bronchus or lung: Secondary | ICD-10-CM

## 2011-11-07 DIAGNOSIS — C3430 Malignant neoplasm of lower lobe, unspecified bronchus or lung: Secondary | ICD-10-CM

## 2011-11-07 DIAGNOSIS — R221 Localized swelling, mass and lump, neck: Secondary | ICD-10-CM

## 2011-11-07 DIAGNOSIS — R220 Localized swelling, mass and lump, head: Secondary | ICD-10-CM

## 2011-11-07 NOTE — Telephone Encounter (Signed)
gve the pt her oct 2013 appt calendar along with the ct scan appt in oct.

## 2011-11-07 NOTE — Progress Notes (Signed)
[facility] [phone]   Fax:([phone]  OFFICE PROGRESS NOTE  [name], MD [address] [place] [address]  DIAGNOSIS: Recurrent non-small cell lung cancer initially diagnosed as stage IIA (T1 M1 MX) adenocarcinoma in April 2011.   PRIOR THERAPY:  1. Status post 3 cycles of neoadjuvant chemotherapy with carboplatin and Alimta. Last dose was given July 23, 2009. 2. Status post left lower lobectomy with lymph node dissection under the care of Dr. Edwyna Shell on October 17, 2009. 3. Status post concurrent chemoradiation with weekly carboplatin and paclitaxel for disease recurrence. Last dose was given May 07, 2010. 4. Status post 3 cycles of consolidation chemotherapy with carboplatin and Alimta. Last dose was given August 13, 2010. 5. Status post palliative radiotherapy to the right neck area. 6. Systemic chemotherapy with single agent Taxotere 75 mg/m2 every 3 weeks,, status post 6 cycles.  CURRENT THERAPY: Systemic chemotherapy with single agent Taxotere 75 mg/m2 every 3 weeks,, status post 6 cycles.   INTERVAL HISTORY: Kaitlin Reed 61 y.o. female returns to the clinic today for two-month followup visit. The patient noticed some swelling in the right neck area recently and she called with request to be seen for evaluation. She is feeling fine today with no specific complaints except for some swelling in the right supraclavicular. She denied having any significant chest pain or shortness of breath. I ordered repeat neck and chest scan which were performed recently and the patient is here today for evaluation and discussion of her scan results.  MEDICAL HISTORY: Past Medical History  Diagnosis Date  . Anxiety   . Hypercholesteremia   . Heart murmur   . TIA (transient ischemic attack) 04/16/11  . Tubal pregnancy   . lung ca dx'd 06/2009    chemo comp 07/2010; xrt comp 06/2010    ALLERGIES:  is allergic to codeine; motrin; other;  trazodone and nefazodone; banana; and tape.  MEDICATIONS:  Current Outpatient Prescriptions  Medication Sig Dispense Refill  . Ascorbic Acid (VITAMIN C) 1000 MG tablet Take 1,000 mg by mouth daily.      Marland Kitchen aspirin 81 MG tablet Take 81 mg by mouth daily.        . calcium carbonate (OS-CAL) 600 MG TABS Take 600 mg by mouth 2 (two) times daily with a meal.      . dexamethasone (DECADRON) 4 MG tablet Take 2 tablets by mouth twice a day, the day before, the day of and the day after chemotherapy  40 tablet  1  . emollient (BIAFINE) cream Apply topically as needed.      . fluconazole (DIFLUCAN) 100 MG tablet take one table by mouth daily until completed  10 tablet  0  . folic acid (FOLVITE) 1 MG tablet TAKE 1 TABLET EVERY DAY  30 tablet  3  . lidocaine (XYLOCAINE JELLY) 2 % jelly Apply topically as needed.  30 mL  0  . metoprolol tartrate (LOPRESSOR) 25 MG tablet Take 25 mg by mouth 2 (two) times daily.      . Multiple Vitamin (MULITIVITAMIN WITH MINERALS) TABS Take 1 tablet by mouth daily.        . potassium chloride (MICRO-K) 10 MEQ CR capsule Take 5 mEq by mouth every other day.        Marland Kitchen VITAMIN D, CHOLECALCIFEROL, PO Take by mouth daily.        . IRON COMBINATIONS PO Take by mouth daily.        Marland Kitchen  ondansetron (ZOFRAN) 4 MG tablet Take 4 mg by mouth every 8 (eight) hours as needed.      Marland Kitchen DISCONTD: Alum & Mag Hydroxide-Simeth (MAGIC MOUTHWASH W/LIDOCAINE) SOLN Take 10 mLs by mouth 4 (four) times daily as needed.  500 mL  5   No current facility-administered medications for this visit.   Facility-Administered Medications Ordered in Other Visits  Medication Dose Route Frequency Provider Last Rate Last Dose  . iohexol (OMNIPAQUE) 300 MG/ML solution 100 mL  100 mL Intravenous Once PRN Medication Radiologist, MD   100 mL at 11/06/11 1403    SURGICAL HISTORY:  Past Surgical History  Procedure Date  . Lung removal, partial 10/17/09    left  . 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   PHYSICAL EXAMINATION: General appearance: alert, cooperative and no distress Head: Normocephalic, without obvious abnormality, atraumatic Neck: no adenopathy Lymph nodes: Cervical, supraclavicular, and axillary nodes normal. Resp: clear to auscultation bilaterally Cardio: regular rate and rhythm, S1, S2 normal, no murmur, click, rub or gallop GI: soft, non-tender; bowel sounds normal; no masses,  no organomegaly Extremities: extremities normal, atraumatic, no cyanosis or edema Neurologic: Alert and oriented X 3, normal strength and tone. Normal symmetric reflexes. Normal coordination and gait  ECOG PERFORMANCE STATUS: 1 - Symptomatic but completely ambulatory  Blood pressure 128/69, pulse 84, temperature 97.1 F (36.2 C), temperature source Oral, height 5\' 6"  (1.676 m), weight 157 lb 14.4 oz (71.623 kg).  LABORATORY DATA: Lab Results  Component Value Date   WBC 3.3* 11/06/2011   HGB 13.5 11/06/2011   HCT 40.6 11/06/2011   MCV 90.9 11/06/2011   PLT 303 11/06/2011      Chemistry      Component Value Date/Time   NA 138 11/06/2011 1121   NA 139 09/17/2011 1607   K 4.3 11/06/2011 1121   K 3.4* 09/17/2011 1607   CL 98 11/06/2011 1121   CL 103 09/17/2011 1607   CO2 30 11/06/2011 1121   CO2 29 09/17/2011 1607   BUN 15 11/06/2011 1121   BUN 12 09/17/2011 1607   CREATININE 0.9 11/06/2011 1121   CREATININE 0.89 09/17/2011 1607      Component Value Date/Time   CALCIUM 9.0 11/06/2011 1121   CALCIUM 8.9 09/17/2011 1607   ALKPHOS 110* 11/06/2011 1121   ALKPHOS 128* 09/17/2011 1607   AST 36 11/06/2011 1121   AST 16 09/17/2011 1607   ALT 6 09/17/2011 1607   BILITOT 0.50 11/06/2011 1121   BILITOT 0.2* 09/17/2011 1607       RADIOGRAPHIC STUDIES: Ct Soft Tissue Neck W Contrast  11/06/2011  *RADIOLOGY REPORT*  Clinical Data: 61 year old female with lung cancer status post chemotherapy and radiation.  Bilateral neck swelling.  CT  NECK WITH CONTRAST  Technique:  Multidetector CT imaging of the neck was performed with intravenous contrast.  Contrast: OMNIPAQUE IOHEXOL 300 MG/ML  SOLN In conjunction with CT(s) of the chest which is(are) reported separately.  Comparison: 07/19/2011 and earlier.  Findings: Chest findings reported separately.  Right level II lymph nodes are stable or slightly further regressed and march.  Residual right level IIA node measures 6 mm in short axis (previously 8 mm and pretreatment to 18 mm).  Scattered small cervical lymph nodes elsewhere are stable or further decreased in size.  No abnormal lymph nodes are identified.  Parapharyngeal spaces, retropharyngeal space, sublingual space, thyroid,  larynx, pharynx, parotid glands and submandibular glands are stable and within normal limits.  Major vascular structures are patent moderate or severe atherosclerosis again suspected at the right common carotid artery origin. Stable and negative visualized brain parenchyma. Visualized orbit soft tissues are within normal limits.  Visualized paranasal sinuses and mastoids are clear.  No acute osseous abnormality identified.  IMPRESSION: 1.  Resolved right neck lymphadenopathy.  No new or acute findings. 2.  Chest findings reported separately.  Original Report Authenticated By: Harley Hallmark, M.D.   Ct Chest W Contrast  11/06/2011  *RADIOLOGY REPORT*  Clinical Data: Non-small cell lung carcinoma.  CT CHEST WITH CONTRAST  Technique:  Multidetector CT imaging of the chest was performed following the standard protocol during bolus administration of intravenous contrast.  Contrast: OMNIPAQUE IOHEXOL 300 MG/ML  SOLN  Comparison: 09/20/2011.  Findings: There is no axillary adenopathy.  Moderate pericardial effusion is present extending in the superior recesses.  Coronary artery atherosclerosis.  Aortic atherosclerosis appears similar. Left lower lobectomy.  Leftward mediastinal shift appears similar. Emphysema. Medial  right upper lobe scarring is present which is similar to prior.  There is no airspace disease.  No pulmonary nodules are identified.  Adrenal glands appear within normal limits.  Right paratracheal lymph node measures 8 mm x 10 mm , slightly smaller than on the prior exam (image 21 series 2).  Scarring and consolidation is present in the right infrahilar region with surgical staples.  Thoracic esophagus appears within normal limits. No hilar adenopathy is present.  No destructive osseous lesions present.  IMPRESSION: 1.  Slight interval decrease in the high right paratracheal lymph node, today measuring 8 mm by 10 mm, no longer enlarged. 2.  Left lower lobectomy.  Left perihilar scarring appears similar to prior exam.  Consolidation is compatible with postradiation changes. 3.  Moderate pericardial effusion appears similar to prior. 4.  Resolution of the mid thoracic esophageal thickening, likely representing resolved esophagitis.  Original Report Authenticated By: Andreas Newport, M.D.    ASSESSMENT: This is a very pleasant 61 years old African American female with recurrent non-small cell lung cancer most recently treated with 6 cycles of systemic chemotherapy with single agent Taxotere with significant improvement in her disease. The patient has no evidence for disease progression on his recent scan of the neck or chest, and the swelling in her right neck area is most likely secondary to fat deposition.  PLAN: I discussed the scan results with the patient and recommended for her continuous observation for now. I would see her back for followup visit in 3 months with repeat CT scan of the chest, abdomen and pelvis. She was advised to call me immediately she has any concerning symptoms in the interval.  All questions were answered. The patient knows to call the clinic with any problems, questions or concerns. We can certainly see the patient much sooner if necessary.

## 2011-11-18 ENCOUNTER — Telehealth: Payer: Self-pay

## 2011-12-04 NOTE — Telephone Encounter (Signed)
Erroneous encounter

## 2011-12-25 ENCOUNTER — Other Ambulatory Visit (HOSPITAL_COMMUNITY): Payer: Self-pay

## 2011-12-25 ENCOUNTER — Other Ambulatory Visit: Payer: Self-pay | Admitting: Lab

## 2012-01-01 ENCOUNTER — Ambulatory Visit: Payer: Self-pay | Admitting: Internal Medicine

## 2012-01-20 ENCOUNTER — Encounter (HOSPITAL_COMMUNITY): Payer: Self-pay | Admitting: *Deleted

## 2012-01-20 ENCOUNTER — Inpatient Hospital Stay (HOSPITAL_COMMUNITY)
Admission: AD | Admit: 2012-01-20 | Discharge: 2012-01-21 | DRG: 313 | Disposition: A | Discharge status: Home / Self Care | Source: Ambulatory Visit | Attending: Cardiovascular Disease | Admitting: Cardiovascular Disease

## 2012-01-20 DIAGNOSIS — C770 Secondary and unspecified malignant neoplasm of lymph nodes of head, face and neck: Secondary | ICD-10-CM

## 2012-01-20 DIAGNOSIS — I2510 Atherosclerotic heart disease of native coronary artery without angina pectoris: Secondary | ICD-10-CM | POA: Diagnosis present

## 2012-01-20 DIAGNOSIS — R0789 Other chest pain: Principal | ICD-10-CM | POA: Diagnosis present

## 2012-01-20 DIAGNOSIS — E7800 Pure hypercholesterolemia, unspecified: Secondary | ICD-10-CM | POA: Diagnosis present

## 2012-01-20 DIAGNOSIS — Z85118 Personal history of other malignant neoplasm of bronchus and lung: Secondary | ICD-10-CM

## 2012-01-20 DIAGNOSIS — Z9221 Personal history of antineoplastic chemotherapy: Secondary | ICD-10-CM

## 2012-01-20 DIAGNOSIS — Z79899 Other long term (current) drug therapy: Secondary | ICD-10-CM

## 2012-01-20 DIAGNOSIS — Z7982 Long term (current) use of aspirin: Secondary | ICD-10-CM

## 2012-01-20 DIAGNOSIS — M5030 Other cervical disc degeneration, unspecified cervical region: Secondary | ICD-10-CM | POA: Diagnosis present

## 2012-01-20 LAB — COMPREHENSIVE METABOLIC PANEL
AST: 17 U/L (ref 0–37)
BUN: 9 mg/dL (ref 6–23)
CO2: 30 mEq/L (ref 19–32)
Chloride: 105 mEq/L (ref 96–112)
Creatinine, Ser: 0.88 mg/dL (ref 0.50–1.10)
GFR calc non Af Amer: 68 mL/min — ABNORMAL LOW (ref >90)
Total Bilirubin: 0.2 mg/dL — ABNORMAL LOW (ref 0.3–1.2)

## 2012-01-20 LAB — CBC WITH DIFFERENTIAL/PLATELET
HCT: 35.3 % — ABNORMAL LOW (ref 36.0–46.0)
Hemoglobin: 12.1 g/dL (ref 12.0–15.0)
Lymphocytes Relative: 32 % (ref 12–46)
Lymphs Abs: 1.3 10*3/uL (ref 0.7–4.0)
Monocytes Absolute: 0.4 10*3/uL (ref 0.1–1.0)
Monocytes Relative: 9 % (ref 3–12)
Neutro Abs: 2.3 10*3/uL (ref 1.7–7.7)
WBC: 4.1 10*3/uL (ref 4.0–10.5)

## 2012-01-20 LAB — APTT: aPTT: 29 seconds (ref 24–37)

## 2012-01-20 MED ORDER — ONDANSETRON HCL 4 MG/2ML IJ SOLN: 4.0000 mg | Freq: Four times a day (QID) | INTRAMUSCULAR | Status: DC | PRN

## 2012-01-20 MED ORDER — ALPRAZOLAM 0.25 MG PO TABS: 0.2500 mg | ORAL_TABLET | Freq: Two times a day (BID) | ORAL | Status: DC | PRN

## 2012-01-20 MED ORDER — CALCIUM CARBONATE ANTACID 500 MG PO CHEW
1.0000 | CHEWABLE_TABLET | Freq: Two times a day (BID) | ORAL | Status: DC
  Administered 2012-01-21: 200 mg via ORAL
  Filled 2012-01-20 (×3): qty 1

## 2012-01-20 MED ORDER — SODIUM CHLORIDE 0.9 % IJ SOLN
3.0000 mL | Freq: Two times a day (BID) | INTRAMUSCULAR | Status: DC
  Administered 2012-01-20: 3 mL via INTRAVENOUS

## 2012-01-20 MED ORDER — ACETAMINOPHEN 325 MG PO TABS
650.0000 mg | ORAL_TABLET | ORAL | Status: DC | PRN
  Administered 2012-01-20: 650 mg via ORAL
  Filled 2012-01-20: qty 2

## 2012-01-20 MED ORDER — HEPARIN BOLUS VIA INFUSION
4000.0000 [IU] | INTRAVENOUS | Status: AC
  Administered 2012-01-20: 4000 [IU] via INTRAVENOUS
  Filled 2012-01-20: qty 4000

## 2012-01-20 MED ORDER — POTASSIUM CHLORIDE CRYS ER 10 MEQ PO TBCR
5.0000 meq | EXTENDED_RELEASE_TABLET | Freq: Every day | ORAL | Status: DC
  Filled 2012-01-20 (×2): qty 1

## 2012-01-20 MED ORDER — CALCIUM CARBONATE 600 MG PO TABS
600.0000 mg | ORAL_TABLET | Freq: Two times a day (BID) | ORAL | Status: DC
  Filled 2012-01-20: qty 1

## 2012-01-20 MED ORDER — METOPROLOL TARTRATE 25 MG PO TABS
25.0000 mg | ORAL_TABLET | Freq: Two times a day (BID) | ORAL | Status: DC
  Administered 2012-01-20 – 2012-01-21 (×2): 25 mg via ORAL
  Filled 2012-01-20 (×3): qty 1

## 2012-01-20 MED ORDER — ADULT MULTIVITAMIN W/MINERALS CH
1.0000 | ORAL_TABLET | Freq: Every day | ORAL | Status: DC
  Administered 2012-01-21: 1 via ORAL
  Filled 2012-01-20: qty 1

## 2012-01-20 MED ORDER — SODIUM CHLORIDE 0.9 % IV SOLN: 250.0000 mL | INTRAVENOUS | Status: DC | PRN

## 2012-01-20 MED ORDER — HEPARIN (PORCINE) IN NACL 100-0.45 UNIT/ML-% IJ SOLN
850.0000 [IU]/h | INTRAMUSCULAR | Status: DC
  Administered 2012-01-20: 850 [IU]/h via INTRAVENOUS
  Filled 2012-01-20 (×2): qty 250

## 2012-01-20 MED ORDER — ASPIRIN 81 MG PO CHEW
324.0000 mg | CHEWABLE_TABLET | ORAL | Status: AC
  Administered 2012-01-20: 324 mg via ORAL
  Filled 2012-01-20: qty 4

## 2012-01-20 MED ORDER — NITROGLYCERIN 0.4 MG SL SUBL: 0.4000 mg | SUBLINGUAL_TABLET | SUBLINGUAL | Status: DC | PRN

## 2012-01-20 MED ORDER — SODIUM CHLORIDE 0.9 % IJ SOLN: 3.0000 mL | INTRAMUSCULAR | Status: DC | PRN

## 2012-01-20 MED ORDER — FOLIC ACID 1 MG PO TABS
1.0000 mg | ORAL_TABLET | Freq: Every day | ORAL | Status: DC
  Administered 2012-01-21: 1 mg via ORAL
  Filled 2012-01-20: qty 1

## 2012-01-20 NOTE — Progress Notes (Addendum)
ANTICOAGULATION CONSULT NOTE - Initial Consult  Pharmacy Consult for Heparin Indication:  Chest pain,  R/O MI  Allergies  Allergen Reactions  . Codeine Nausea And Vomiting  . Motrin (Ibuprofen) Nausea And Vomiting  . Other Nausea Only    steroids  . Trazodone And Nefazodone Nausea And Vomiting  . Banana Hives  . Tape Swelling    Paper tape okay Cannot use elastic tape    Patient Measurements: Height: 5\' 6"  (167.6 cm) Weight: 159 lb (72.122 kg) IBW/kg (Calculated) : 59.3  Heparin Dosing Weight: 72 kg  Vital Signs: Temp: 97.9 F (36.6 C) (09/23 1745) Temp src: Oral (09/23 1745) BP: 120/81 mmHg (09/23 1745) Pulse Rate: 82  (09/23 1745)  Labs: pending  No results found for this basename: HGB:2,HCT:3,PLT:3,APTT:3,LABPROT:3,INR:3,HEPARINUNFRC:3,CREATININE:3,CKTOTAL:3,CKMB:3,TROPONINI:3 in the last 72 hours  Estimated Creatinine Clearance: 65 ml/min (by C-G formula based on Cr of 0.9).   Medical History: Past Medical History  Diagnosis Date  . Anxiety   . Hypercholesteremia   . Heart murmur   . TIA (transient ischemic attack) 04/16/11  . Tubal pregnancy   . lung ca dx'd 06/2009    chemo comp 07/2010; xrt comp 06/2010    Medications:  Prescriptions prior to admission  Medication Sig Dispense Refill  . Ascorbic Acid (VITAMIN C) 1000 MG tablet Take 1,000 mg by mouth daily.      Marland Kitchen aspirin EC 81 MG tablet Take 81 mg by mouth daily.      . calcium carbonate (OS-CAL) 600 MG TABS Take 600 mg by mouth 2 (two) times daily with a meal.      . Cyanocobalamin (B-12 PO) Take 1 tablet by mouth daily.      . folic acid (FOLVITE) 1 MG tablet TAKE 1 TABLET EVERY DAY  30 tablet  3  . IRON COMBINATIONS PO Take 1 tablet by mouth daily.       . metoprolol tartrate (LOPRESSOR) 25 MG tablet Take 25 mg by mouth 2 (two) times daily.      . Multiple Vitamin (MULITIVITAMIN WITH MINERALS) TABS Take 1 tablet by mouth daily.        . potassium chloride (MICRO-K) 10 MEQ CR capsule Take 5 mEq by  mouth daily.       Marland Kitchen VITAMIN D, CHOLECALCIFEROL, PO Take 1 tablet by mouth daily.       Marland Kitchen VITAMIN E PO Take 1 tablet by mouth daily.       Scheduled:    . aspirin  324 mg Oral NOW  . calcium carbonate  1 tablet Oral BID WC  . folic acid  1 mg Oral Daily  . metoprolol tartrate  25 mg Oral BID  . multivitamin with minerals  1 tablet Oral Daily  . potassium chloride  5 mEq Oral Daily  . sodium chloride  3 mL Intravenous Q12H  . DISCONTD: calcium carbonate  600 mg Oral BID WC    Assessment: 61 y.o female s/p non-small cell cancer surgery/chemo/radiation.  Admitted today with chest pain. Now to start IV heparin per pharmacy protocol for r/o MI.  No bleeding reported. Admission labs are pending.    Goal of Therapy:  Heparin level 0.3-0.7 units/ml Monitor platelets by anticoagulation protocol: Yes   Plan:  Will hold on bolus until admit CBC, INR, PTT drawn and evaluated. Begin IV Heparin drip at 850 units/hr.  Heparin level 6 hours after bolus then qAM CBC qAM.  Noah Delaine, RPh Clinical Pharmacist Pager: [phone] 01/20/2012,7:02 PM  Addendum: Sherrilyn Rist  labs : INR 1.02, PTT 29sec,  H/H 12.1/35.3, PLTC 296K  Plan: Give Heparin IV bolus 4000 units x 1 now, Adjust 6hr HL to 05:00 01/21/12.   Noah Delaine, RPh Clinical Pharmacist Pager: [phone] 01/20/2012 22:59

## 2012-01-20 NOTE — H&P (Addendum)
Kaitlin Reed is an 61 y.o. female.   Chief Complaint: Chest pain.  HPI: 61 years old female with radiation treatment (06/2010) and chemotherapy treatment (07/2011) for small cell lung CA and partial left lung removal (09/2009) has sharp left sided chest pain + left arm tingling and numbness. No fever or cough. H/O cervical disc disease.  Past Medical History  Diagnosis Date  . Hypercholesteremia   . Heart murmur   . Tubal pregnancy   . lung ca dx'd 06/2009    chemo comp 07/2010; xrt comp 06/2010      Past Surgical History  Procedure Date  . Lung removal, partial 10/17/09    left  . 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 Hypertension Father   . Cancer Sister    Social History:  reports that she has quit smoking. Her smoking use included Cigarettes. She has a 13 pack-year smoking history. She has never used smokeless tobacco. She reports that she does not drink alcohol or use illicit drugs.  Allergies:  Allergies  Allergen Reactions  . Codeine Nausea And Vomiting  . Motrin (Ibuprofen) Nausea And Vomiting  . Other Nausea Only    steroids  . Trazodone And Nefazodone Nausea And Vomiting  . Banana Hives  . Tape Swelling    Paper tape okay Cannot use elastic tape    Medications Prior to Admission  Medication Sig Dispense Refill  . Ascorbic Acid (VITAMIN C) 1000 MG tablet Take 1,000 mg by mouth daily.      Marland Kitchen aspirin EC 81 MG tablet Take 81 mg by mouth daily.      . calcium carbonate (OS-CAL) 600 MG TABS Take 600 mg by mouth 2 (two) times daily with a meal.      . Cyanocobalamin (B-12 PO) Take 1 tablet by mouth daily.      . folic acid (FOLVITE) 1 MG tablet TAKE 1 TABLET EVERY DAY  30 tablet  3  . IRON COMBINATIONS PO Take 1 tablet by mouth daily.       . metoprolol tartrate (LOPRESSOR) 25 MG tablet Take 25 mg by mouth 2 (two) times daily.      . Multiple Vitamin (MULITIVITAMIN WITH MINERALS) TABS Take 1 tablet by  mouth daily.        . potassium chloride (MICRO-K) 10 MEQ CR capsule Take 5 mEq by mouth daily.       Marland Kitchen VITAMIN D, CHOLECALCIFEROL, PO Take 1 tablet by mouth daily.       Marland Kitchen VITAMIN E PO Take 1 tablet by mouth daily.        No results found for this or any previous visit (from the past 48 hour(s)). No results found.  @ROS @ ROS: + wears reading glasses, + wears partial upper and lower dentures, No cough, asthma or pneumonias. + palpitations, No gastrointestinal bleed, + lung cancer. No gastrointestinal or genitourinary bleeding, No stroke, seizures or psychiatric admission.  Blood pressure 120/81, pulse 82, temperature 97.9 F (36.6 C), temperature source Oral, resp. rate 20, height 5\' 6"  (1.676 m), weight 72.122 kg (159 lb), SpO2 98.00%. General appearance: alert, cooperative and appears stated age  Head: Normocephalic, without obvious abnormality, atraumatic  Eyes: Brown eyes, conjunctivae/corneas clear. PERRL, EOM's intact.  Throat: lips, mucosa, and tongue normal; teeth and gums normal  Neck: No carotid bruit, no JVD, supple, symmetrical, trachea midline and thyroid not enlarged, symmetric.  Resp: clear to auscultation bilaterally. Chest wall non-tender on palpation. Cardio: regular rate and rhythm, S1, S2 normal, II/VI systolic murmur  GI: soft, non-tender; bowel sounds normal; no masses, no organomegaly  Extremities: extremities normal, atraumatic, no cyanosis or edema  Skin: Skin color, texture, turgor normal. Multiple freckles over face.  Neurologic: Alert and oriented X 3, normal strength and tone. Normal symmetric reflexes. Normal coordination and gait. Right handed   Assessment/Plan Chest pain  -r/o MI S/P non-small cell cancer surgery/Chemo/radiation CAD, native vessel Cervical disc disease  Cardiac enzyme. Nuclear stress test.  Orpah Cobb S 01/20/2012, 6:30 PM

## 2012-01-21 ENCOUNTER — Observation Stay (HOSPITAL_COMMUNITY)

## 2012-01-21 LAB — BASIC METABOLIC PANEL
CO2: 26 mEq/L (ref 19–32)
Calcium: 9.8 mg/dL (ref 8.4–10.5)
Creatinine, Ser: 0.87 mg/dL (ref 0.50–1.10)
GFR calc Af Amer: 80 mL/min — ABNORMAL LOW (ref >90)
GFR calc non Af Amer: 69 mL/min — ABNORMAL LOW (ref >90)
Sodium: 141 mEq/L (ref 135–145)

## 2012-01-21 LAB — CBC
MCV: 86.9 fL (ref 78.0–100.0)
Platelets: 286 10*3/uL (ref 150–400)
RBC: 4.21 MIL/uL (ref 3.87–5.11)
RDW: 12.6 % (ref 11.5–15.5)
WBC: 3.6 10*3/uL — ABNORMAL LOW (ref 4.0–10.5)

## 2012-01-21 LAB — TROPONIN I: Troponin I: <0.3 ng/mL (ref <0.30)

## 2012-01-21 LAB — LIPID PANEL
Cholesterol: 216 mg/dL — ABNORMAL HIGH (ref 0–200)
HDL: 68 mg/dL (ref >39)
Total CHOL/HDL Ratio: 3.2 RATIO
Triglycerides: 70 mg/dL (ref <150)

## 2012-01-21 LAB — HEPARIN LEVEL (UNFRACTIONATED): Heparin Unfractionated: 0.44 IU/mL (ref 0.30–0.70)

## 2012-01-21 MED ORDER — TECHNETIUM TC 99M SESTAMIBI GENERIC - CARDIOLITE
10.0000 | Freq: Once | INTRAVENOUS | Status: AC | PRN
  Administered 2012-01-21: 10 via INTRAVENOUS

## 2012-01-21 MED ORDER — PRAVASTATIN SODIUM 20 MG PO TABS: 20.0000 mg | ORAL_TABLET | Freq: Every day | ORAL | Status: DC

## 2012-01-21 MED ORDER — WARFARIN SODIUM 5 MG PO TABS
5.0000 mg | ORAL_TABLET | Freq: Once | ORAL | Status: DC
Start: 2012-01-21 — End: 2012-01-21
  Filled 2012-01-21: qty 1

## 2012-01-21 MED ORDER — WARFARIN - PHARMACIST DOSING INPATIENT: Freq: Every day | Status: DC

## 2012-01-21 MED ORDER — TECHNETIUM TC 99M SESTAMIBI GENERIC - CARDIOLITE
30.0000 | Freq: Once | INTRAVENOUS | Status: AC | PRN
  Administered 2012-01-21: 30 via INTRAVENOUS

## 2012-01-21 MED ORDER — REGADENOSON 0.4 MG/5ML IV SOLN
0.4000 mg | Freq: Once | INTRAVENOUS | Status: AC
  Administered 2012-01-21: 0.4 mg via INTRAVENOUS

## 2012-01-21 NOTE — Discharge Summary (Signed)
Physician Discharge Summary  Patient ID: Kaitlin Reed MRN: [id number] DOB/AGE: March 03, 1951 61 y.o.  Admit date: 01/20/2012 Discharge date: 01/21/2012  Admission Diagnoses: Chest pain  -r/o MI  S/P non-small cell cancer surgery/Chemo/radiation  CAD, native vessel  Cervical disc disease  Discharge Diagnoses:  Active Problems:  * Chest pain at rest, probably non-cardiac* Principle diagnosis  S/P non-small cell cancer surgery/Chemo/radiation  CAD, native vessel  Cervical disc disease   Discharged Condition: good  Hospital Course: 61 years old female had recurrent left sided chest pain and left arm tingling and numbness without fever or cough. MI was ruled out. She underwent nuclear stress test that failed to show reversible ischemia. She was discharged home with follow up by me in 1 month.  Consults: None  Significant Diagnostic Studies: labs: Normal CBC and electrolytes and cardiac enzymes.   Nuclear medicine: No evidence of reversible ischemia.  Treatments: IV heparin, Oxygen and home medications.  Discharge Exam: Blood pressure 107/67, pulse 71, temperature 97.9 F (36.6 C), temperature source Oral, resp. rate 20, height 5\' 6"  (1.676 m), weight 75.025 kg (165 lb 6.4 oz), SpO2 96.00%.  General appearance: alert, cooperative and appears stated age  Head: Normocephalic, without obvious abnormality, atraumatic  Eyes: Brown eyes, conjunctivae/corneas clear. PERRL, EOM's intact.  Throat: lips, mucosa, and tongue normal; teeth and gums normal  Neck: No carotid bruit, no JVD, supple, symmetrical, trachea midline and thyroid not enlarged, symmetric.  Resp: clear to auscultation bilaterally. Chest wall non-tender on palpation.  Cardio: regular rate and rhythm, S1, S2 normal, II/VI systolic murmur  GI: soft, non-tender; bowel sounds normal; no masses, no organomegaly  Extremities: extremities normal, atraumatic, no cyanosis or edema  Skin: Skin color, texture, turgor normal.  Multiple freckles over face.  Neurologic: Alert and oriented X 3, normal strength and tone. Normal symmetric reflexes. Normal coordination and gait. Right handed  Disposition: 01-Home or Self Care  Discharge Orders    Future Appointments: Provider: Department: Dept Phone: Center:   02/06/2012 9:00 AM Windell Hummingbird [facility] [phone] None   02/06/2012 10:00 AM [facility] [phone] [place]   02/10/2012 11:30 AM Si Gaul, MD [facility] [phone] None       Medication List     As of 01/21/2012  5:04 PM    TAKE these medications         aspirin EC 81 MG tablet   Take 81 mg by mouth daily.      B-12 PO   Take 1 tablet by mouth daily.      calcium carbonate 600 MG Tabs   Commonly known as: OS-CAL   Take 600 mg by mouth 2 (two) times daily with a meal.      folic acid 1 MG tablet   Commonly known as: FOLVITE   TAKE 1 TABLET EVERY DAY      IRON COMBINATIONS PO   Take 1 tablet by mouth daily.      metoprolol tartrate 25 MG tablet   Commonly known as: LOPRESSOR   Take 25 mg by mouth 2 (two) times daily.      multivitamin with minerals Tabs   Take 1 tablet by mouth daily.      potassium chloride 10 MEQ CR capsule   Commonly known as: MICRO-K   Take 5 mEq by mouth daily.      pravastatin 20 MG tablet   Commonly known as: PRAVACHOL   Take 1 tablet (20 mg total) by mouth daily.  vitamin C 1000 MG tablet   Take 1,000 mg by mouth daily.      VITAMIN D (CHOLECALCIFEROL) PO   Take 1 tablet by mouth daily.      VITAMIN E PO   Take 1 tablet by mouth daily.         SignedOrpah Cobb S 01/21/2012, 5:04 PM

## 2012-01-21 NOTE — Progress Notes (Signed)
While pt was resting in bed pt stated she was a squeezing 5/10 pain in her  left arm.  Pt denied any chest tightness.  She stated it was the same feeling that brought her to the hospital. Vs stable. Pt's pain was relieved with one SL nitro. Will cont to monitor pt.

## 2012-01-21 NOTE — Progress Notes (Signed)
Utilization review complete 

## 2012-01-21 NOTE — Progress Notes (Signed)
ANTICOAGULATION CONSULT NOTE   Pharmacy Consult for Heparin Indication:  Chest pain,  R/O MI  Allergies  Allergen Reactions  . Codeine Nausea And Vomiting  . Motrin (Ibuprofen) Nausea And Vomiting  . Other Nausea Only    steroids  . Trazodone And Nefazodone Nausea And Vomiting  . Banana Hives  . Tape Swelling    Paper tape okay Cannot use elastic tape    Patient Measurements: Height: 5\' 6"  (167.6 cm) Weight: 159 lb (72.122 kg) IBW/kg (Calculated) : 59.3  Heparin Dosing Weight: 72 kg  Vital Signs: Temp: 97.9 F (36.6 C) (09/23 2100) Temp src: Oral (09/23 2100) BP: 100/67 mmHg (09/23 2100) Pulse Rate: 91  (09/23 2100)  Labs: pending   Basename 01/21/12 0557 01/21/12 0114 01/20/12 1926 01/20/12 1925  HGB 12.7 -- 12.1 --  HCT 36.6 -- 35.3* --  PLT 286 -- 296 --  APTT -- -- 29 --  LABPROT 13.2 -- 13.3 --  INR 1.01 -- 1.02 --  HEPARINUNFRC 0.44 -- -- --  CREATININE -- -- 0.88 --  CKTOTAL -- -- -- --  CKMB -- -- -- --  TROPONINI -- <0.30 -- <0.30    Estimated Creatinine Clearance: 66.5 ml/min (by C-G formula based on Cr of 0.88).  Assessment: 61 y.o female with chest pain for Hrparin  Goal of Therapy:  Heparin level 0.3-0.7 units/ml Monitor platelets by anticoagulation protocol: Yes   Plan:  Continue Heparin at current rate  F/U plan  Geannie Risen, PharmD, BCPS

## 2012-01-29 ENCOUNTER — Other Ambulatory Visit: Payer: Self-pay | Admitting: Cardiovascular Disease

## 2012-01-29 DIAGNOSIS — Z1231 Encounter for screening mammogram for malignant neoplasm of breast: Secondary | ICD-10-CM

## 2012-02-03 ENCOUNTER — Ambulatory Visit: Payer: Self-pay | Admitting: Internal Medicine

## 2012-02-06 ENCOUNTER — Ambulatory Visit (HOSPITAL_COMMUNITY)
Admission: RE | Admit: 2012-02-06 | Discharge: 2012-02-06 | Disposition: A | Discharge status: Home / Self Care | Source: Ambulatory Visit | Attending: Internal Medicine | Admitting: Internal Medicine

## 2012-02-06 ENCOUNTER — Other Ambulatory Visit (HOSPITAL_BASED_OUTPATIENT_CLINIC_OR_DEPARTMENT_OTHER): Admitting: Lab

## 2012-02-06 ENCOUNTER — Telehealth: Payer: Self-pay | Admitting: Internal Medicine

## 2012-02-06 DIAGNOSIS — C3430 Malignant neoplasm of lower lobe, unspecified bronchus or lung: Secondary | ICD-10-CM

## 2012-02-06 DIAGNOSIS — C3490 Malignant neoplasm of unspecified part of unspecified bronchus or lung: Secondary | ICD-10-CM | POA: Insufficient documentation

## 2012-02-06 DIAGNOSIS — Z923 Personal history of irradiation: Secondary | ICD-10-CM | POA: Insufficient documentation

## 2012-02-06 DIAGNOSIS — K5730 Diverticulosis of large intestine without perforation or abscess without bleeding: Secondary | ICD-10-CM | POA: Insufficient documentation

## 2012-02-06 DIAGNOSIS — N281 Cyst of kidney, acquired: Secondary | ICD-10-CM | POA: Insufficient documentation

## 2012-02-06 DIAGNOSIS — D259 Leiomyoma of uterus, unspecified: Secondary | ICD-10-CM | POA: Insufficient documentation

## 2012-02-06 DIAGNOSIS — Z9221 Personal history of antineoplastic chemotherapy: Secondary | ICD-10-CM | POA: Insufficient documentation

## 2012-02-06 DIAGNOSIS — C7951 Secondary malignant neoplasm of bone: Secondary | ICD-10-CM | POA: Insufficient documentation

## 2012-02-06 DIAGNOSIS — R599 Enlarged lymph nodes, unspecified: Secondary | ICD-10-CM | POA: Insufficient documentation

## 2012-02-06 LAB — CBC WITH DIFFERENTIAL/PLATELET
Basophils Absolute: 0 10*3/uL (ref 0.0–0.1)
EOS%: 2 % (ref 0.0–7.0)
Eosinophils Absolute: 0.1 10*3/uL (ref 0.0–0.5)
HCT: 37.7 % (ref 34.8–46.6)
HGB: 12.8 g/dL (ref 11.6–15.9)
MCH: 29.9 pg (ref 25.1–34.0)
MCV: 88.1 fL (ref 79.5–101.0)
MONO%: 8 % (ref 0.0–14.0)
NEUT#: 1.9 10*3/uL (ref 1.5–6.5)
NEUT%: 54.7 % (ref 38.4–76.8)
Platelets: 280 10*3/uL (ref 145–400)

## 2012-02-06 LAB — COMPREHENSIVE METABOLIC PANEL (CC13)
AST: 15 U/L (ref 5–34)
Albumin: 3.5 g/dL (ref 3.5–5.0)
Alkaline Phosphatase: 143 U/L (ref 40–150)
BUN: 13 mg/dL (ref 7.0–26.0)
Calcium: 9.3 mg/dL (ref 8.4–10.4)
Creatinine: 0.9 mg/dL (ref 0.6–1.1)
Glucose: 96 mg/dl (ref 70–99)
Potassium: 3.8 mEq/L (ref 3.5–5.1)

## 2012-02-06 MED ORDER — IOHEXOL 300 MG/ML  SOLN
100.0000 mL | Freq: Once | INTRAMUSCULAR | Status: AC | PRN
  Administered 2012-02-06: 100 mL via INTRAVENOUS

## 2012-02-06 NOTE — Telephone Encounter (Signed)
pt called to cx 10/14 appt due to her gyn appt and has r/s to 10/21      aom

## 2012-02-10 ENCOUNTER — Ambulatory Visit (INDEPENDENT_AMBULATORY_CARE_PROVIDER_SITE_OTHER): Admitting: Obstetrics and Gynecology

## 2012-02-10 ENCOUNTER — Ambulatory Visit: Payer: Self-pay | Admitting: Internal Medicine

## 2012-02-10 ENCOUNTER — Encounter: Payer: Self-pay | Admitting: Obstetrics and Gynecology

## 2012-02-10 VITALS — BP 102/68 | HR 94 | Ht 66.0 in | Wt 162.0 lb

## 2012-02-10 DIAGNOSIS — Z01419 Encounter for gynecological examination (general) (routine) without abnormal findings: Secondary | ICD-10-CM

## 2012-02-10 DIAGNOSIS — Z124 Encounter for screening for malignant neoplasm of cervix: Secondary | ICD-10-CM

## 2012-02-10 MED ORDER — FLUCONAZOLE 150 MG PO TABS: 150.0000 mg | ORAL_TABLET | Freq: Once | ORAL | Status: DC

## 2012-02-10 NOTE — Progress Notes (Signed)
Regular Periods: no Mammogram: yes  Monthly Breast Ex.: yes Exercise: yes  Tetanus < 10 years: yes Seatbelts: yes  NI. Bladder Functn.: yes Abuse at home: no  Daily BM's: yes Stressful Work: no  Healthy Diet: yes Sigmoid-Colonoscopy: 2012  nl  Calcium: yes Medical problems this year: no problem    LAST PAP:9/12  Contraception: none post menopausel  Mammogram:  2012   Scheduled 02-21-12  PCP: DR. KADAKIA  PMH: NO CHANGE  FMH: NO CHANGE  Last Bone Scan: LONG TIME   PT IS A WIDOWER

## 2012-02-10 NOTE — Progress Notes (Signed)
Subjective:    Kaitlin Reed is a 61 y.o. female G3P2 who presents for annual exam. The patient has no complaints today.     PMH: osteoporosis (2010)    Review of Systems Gastrointestinal:No change in bowel habits, no abdominal pain, no rectal bleeding Genitourinary:negative for abnormal vaginal bleeding,  dysuria, frequency, hematuria, nocturia and urinary incontinence    Objective:     BP 102/68  Pulse 94  Ht 5\' 6"  (1.676 m)  Wt 162 lb (73.483 kg)  BMI 26.15 kg/m2 Weight:  Wt Readings from Last 1 Encounters:  02/10/12 162 lb (73.483 kg)   Body mass index is 26.15 kg/(m^2). General Appearance:  Well nourished in no acute distress HEENT: Grossly normal Neck / Thyroid: Supple, no masses, nodes or enlargement Lungs: Clear to auscultation bilaterally Back: No CVA tenderness Breast Exam: No masses or nodes.No dimpling, nipple retraction or discharge. Cardiovascular: Regular rate and rhythm.  Gastrointestinal: Soft, non-tender, no masses or organomegaly Pelvic Exam: EGBUS-normal, vagina-atrophich without lesions, cervix-stenotic, no tenderness or lesions, uterus-appears normal size, shape and consistency, adnexae-no masses or tenderness Rectovaginal: Deferred at patient request due to hemorrhoids (managed by Dr. Elnoria Howard)  Lymphatic Exam: Non-palpable nodes in neck, clavicular, axillary, or inguinal regions Skin: No rash or abnormalities Extremities: no clubbing cyanosis or edema Neurologic: Grossly normal Psychiatric: Alert and oriented x 3    Assessment:   Routine GYN Exam Osteoporosis S/P Lung Cancer   Plan:   PAP sent  RTO 1 year or prn  DEXA Scan-pending   Izaak Sahr,ELMIRAPA-C

## 2012-02-11 ENCOUNTER — Other Ambulatory Visit: Payer: Self-pay | Admitting: *Deleted

## 2012-02-11 DIAGNOSIS — C3490 Malignant neoplasm of unspecified part of unspecified bronchus or lung: Secondary | ICD-10-CM

## 2012-02-12 LAB — PAP IG W/ RFLX HPV ASCU

## 2012-02-17 ENCOUNTER — Ambulatory Visit (HOSPITAL_BASED_OUTPATIENT_CLINIC_OR_DEPARTMENT_OTHER): Admitting: Internal Medicine

## 2012-02-17 ENCOUNTER — Ambulatory Visit (HOSPITAL_BASED_OUTPATIENT_CLINIC_OR_DEPARTMENT_OTHER)

## 2012-02-17 ENCOUNTER — Telehealth: Payer: Self-pay | Admitting: Internal Medicine

## 2012-02-17 VITALS — BP 125/71 | HR 87 | Temp 97.0°F | Resp 20 | Ht 66.0 in | Wt 164.7 lb

## 2012-02-17 DIAGNOSIS — C3490 Malignant neoplasm of unspecified part of unspecified bronchus or lung: Secondary | ICD-10-CM

## 2012-02-17 DIAGNOSIS — Z23 Encounter for immunization: Secondary | ICD-10-CM

## 2012-02-17 DIAGNOSIS — C3430 Malignant neoplasm of lower lobe, unspecified bronchus or lung: Secondary | ICD-10-CM

## 2012-02-17 MED ORDER — INFLUENZA VIRUS VACC SPLIT PF IM SUSP
0.5000 mL | Freq: Once | INTRAMUSCULAR | Status: AC
  Administered 2012-02-17: 0.5 mL via INTRAMUSCULAR
  Filled 2012-02-17: qty 0.5

## 2012-02-17 NOTE — Telephone Encounter (Signed)
Gave pt appt for December lab and Ct, NPO 4 hours prior to Ct then see MD the next day

## 2012-02-17 NOTE — Patient Instructions (Signed)
Your CT scan showed no evidence for disease progression except for an enlarging right paratracheal lymph node. We discussed the treatment options including observation and repeat scan in 6 weeks versus resuming systemic chemotherapy. You elected to continue on observation for now because of holiday plans. Followup in 6 weeks with repeat CT scan of the chest. Please call if you have any concerning symptoms in the interval.

## 2012-02-17 NOTE — Progress Notes (Signed)
[name] Telephone:(336) [phone]   Fax:([phone]  OFFICE PROGRESS NOTE  Kaitlin Rodriguez, MD [address] [place] [address]  DIAGNOSIS: Recurrent non-small cell lung cancer initially diagnosed as stage IIA (T1 M1 MX) adenocarcinoma in April 2011.   PRIOR THERAPY:  1. Status post 3 cycles of neoadjuvant chemotherapy with carboplatin and Alimta. Last dose was given July 23, 2009. 2. Status post left lower lobectomy with lymph node dissection under the care of Dr. Edwyna Shell on October 17, 2009. 3. Status post concurrent chemoradiation with weekly carboplatin and paclitaxel for disease recurrence. Last dose was given May 07, 2010. 4. Status post 3 cycles of consolidation chemotherapy with carboplatin and Alimta. Last dose was given August 13, 2010. 5. Status post palliative radiotherapy to the right neck area. 6. Systemic chemotherapy with single agent Taxotere 75 mg/m2 every 3 weeks,, status post 6 cycles, last dose was given on 09/03/2011.  CURRENT THERAPY: Observation.  INTERVAL HISTORY: Kaitlin Reed 61 y.o. female returns to the clinic today for routine three-month followup visit. The patient is feeling fine today with no specific complaints. She denied having any significant weight loss or night sweats. She has no chest pain, shortness breath, cough or hemoptysis. She has no nausea or vomiting. The patient has repeat CT scan of the chest, abdomen and pelvis performed recently and she is here for evaluation and discussion of her scan results.  MEDICAL HISTORY: Past Medical History  Diagnosis Date  . Heart murmur   . Tubal pregnancy   . lung ca dx'd 06/2009    chemo comp 07/2010; xrt comp 06/2010  . Hypercholesteremia     h/o    ALLERGIES:  is allergic to codeine; motrin; other; trazodone and nefazodone; banana; and tape.  MEDICATIONS:  Current Outpatient Prescriptions  Medication Sig Dispense Refill  . Ascorbic Acid (VITAMIN C) 1000 MG tablet  Take 1,000 mg by mouth daily.      Marland Kitchen aspirin EC 81 MG tablet Take 81 mg by mouth daily.      . calcium carbonate (OS-CAL) 600 MG TABS Take 600 mg by mouth 2 (two) times daily with a meal.      . Cyanocobalamin (B-12 PO) Take 1 tablet by mouth daily.      . fluconazole (DIFLUCAN) 150 MG tablet Take 1 tablet (150 mg total) by mouth once.  1 tablet  1  . folic acid (FOLVITE) 1 MG tablet TAKE 1 TABLET EVERY DAY  30 tablet  3  . IRON COMBINATIONS PO Take 1 tablet by mouth daily.       . metoprolol tartrate (LOPRESSOR) 25 MG tablet Take 25 mg by mouth 2 (two) times daily.      . Multiple Vitamin (MULITIVITAMIN WITH MINERALS) TABS Take 1 tablet by mouth daily.        . potassium chloride (MICRO-K) 10 MEQ CR capsule Take 5 mEq by mouth daily.       Marland Kitchen VITAMIN D, CHOLECALCIFEROL, PO Take 1 tablet by mouth daily.       Marland Kitchen VITAMIN E PO Take 1 tablet by mouth daily.      Marland Kitchen DISCONTD: Alum & Mag Hydroxide-Simeth (MAGIC MOUTHWASH W/LIDOCAINE) SOLN Take 10 mLs by mouth 4 (four) times daily as needed.  500 mL  5    SURGICAL HISTORY:  Past Surgical History  Procedure Date  . Lung removal, partial 10/17/09    left. pt states they removed small piece of lung due  to cancer  . 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   PHYSICAL EXAMINATION: General appearance: alert, cooperative and no distress Head: Normocephalic, without obvious abnormality, atraumatic Neck: no adenopathy Lymph nodes: Cervical, supraclavicular, and axillary nodes normal. Resp: clear to auscultation bilaterally Cardio: regular rate and rhythm, S1, S2 normal, no murmur, click, rub or gallop GI: soft, non-tender; bowel sounds normal; no masses,  no organomegaly Extremities: extremities normal, atraumatic, no cyanosis or edema Neurologic: Alert and oriented X 3, normal strength and tone. Normal symmetric reflexes. Normal coordination and gait  ECOG PERFORMANCE  STATUS: 0 - Asymptomatic  Blood pressure 125/71, pulse 87, temperature 97 F (36.1 C), temperature source Oral, resp. rate 20, height 5\' 6"  (1.676 m), weight 164 lb 11.2 oz (74.707 kg).  LABORATORY DATA: Lab Results  Component Value Date   WBC 3.5* 02/06/2012   HGB 12.8 02/06/2012   HCT 37.7 02/06/2012   MCV 88.1 02/06/2012   PLT 280 02/06/2012      Chemistry      Component Value Date/Time   NA 140 02/06/2012 0931   NA 141 01/21/2012 0557   NA 138 11/06/2011 1121   K 3.8 02/06/2012 0931   K 3.9 01/21/2012 0557   K 4.3 11/06/2011 1121   CL 106 02/06/2012 0931   CL 105 01/21/2012 0557   CL 98 11/06/2011 1121   CO2 22 02/06/2012 0931   CO2 26 01/21/2012 0557   CO2 30 11/06/2011 1121   BUN 13.0 02/06/2012 0931   BUN 13 01/21/2012 0557   BUN 15 11/06/2011 1121   CREATININE 0.9 02/06/2012 0931   CREATININE 0.87 01/21/2012 0557   CREATININE 0.9 11/06/2011 1121      Component Value Date/Time   CALCIUM 9.3 02/06/2012 0931   CALCIUM 9.8 01/21/2012 0557   CALCIUM 9.0 11/06/2011 1121   ALKPHOS 143 02/06/2012 0931   ALKPHOS 129* 01/20/2012 1926   ALKPHOS 110* 11/06/2011 1121   AST 15 02/06/2012 0931   AST 17 01/20/2012 1926   AST 36 11/06/2011 1121   ALT <6 02/06/2012 0931   ALT 6 01/20/2012 1926   BILITOT 0.30 02/06/2012 0931   BILITOT 0.2* 01/20/2012 1926   BILITOT 0.50 11/06/2011 1121       RADIOGRAPHIC STUDIES: Ct Chest W Contrast  02/06/2012  *RADIOLOGY REPORT*  Clinical Data:  Follow-up non-small cell lung carcinoma. Restaging.  Completed chemotherapy and radiation therapy.  CT CHEST, ABDOMEN AND PELVIS WITH CONTRAST  Technique:  Multidetector CT imaging of the chest, abdomen and pelvis was performed following the standard protocol during bolus administration of intravenous contrast.  Contrast: OMNIPAQUE IOHEXOL 300 MG/ML  SOLN  Comparison:  09/20/2011  CT CHEST  Findings:  Postsurgical changes in the left hemithorax again noted. An area of confluent opacity in the posterior left  midlung is stable in appearance and continues to show central air bronchograms. This remains consistent with post-treatment scarring/fibrosis.  No evidence of recurrent neoplasm at this site. No other suspicious pulmonary nodules or masses are identified. There is no evidence of acute infiltrate or pleural effusion.  Mild pericardial effusion or thickening remains stable. Moderate pulmonary emphysema again noted.  Increased mediastinal lymphadenopathy is seen in the right paratracheal region, now measuring 1.6 x 2.0 cm compared to 1.0 x 1.1 cm on previous study.  In addition to being larger, this adenopathy also shows central necrosis.  No other areas  of lymphadenopathy are seen within the thorax.  No suspicious bone lesions are identified.  IMPRESSION:  1.  Increased mild mediastinal lymphadenopathy in the right paratracheal region, which also shows central necrosis. 2.  Stable post-treatment changes in the left lung.  No other sites of new or progressive disease identified.  CT ABDOMEN AND PELVIS  Findings:  The abdominal parenchymal organs show no significant abnormality.  Tiny left renal cyst is stable.  No evidence of hydronephrosis.  Several small uterine fibroids again noted, but no other soft tissue masses are identified within the abdomen or pelvis.  No evidence of lymphadenopathy.  No evidence of inflammatory process or abnormal fluid collections.  Sigmoid diverticulosis again demonstrated, without evidence of diverticulitis.  Previously seen sclerotic metastasis in the left iliac wing a left sclerotic and more permeative appearing cyst which may be increased in size.  This may represent progression of metastasis at the site. No other suspicious bone lesions are identified.  IMPRESSION:  1.  No evidence of soft tissue metastatic disease within the abdomen or pelvis. 2.  More lytic and permeative appearance of previously seen sclerotic metastasis in the left ilium. This raises the question of progression of  bone metastasis at this site, although no other suspicious bone lesions are identified.  If clinically indicated, bone scan or PET CT scan could be performed for further evaluation. 3.  Stable small uterine fibroids and sigmoid diverticulosis.   Original Report Authenticated By: Danae Orleans, M.D.    Ct Abdomen Pelvis W Contrast  02/06/2012  *RADIOLOGY REPORT*  Clinical Data:  Follow-up non-small cell lung carcinoma. Restaging.  Completed chemotherapy and radiation therapy.  CT CHEST, ABDOMEN AND PELVIS WITH CONTRAST  Technique:  Multidetector CT imaging of the chest, abdomen and pelvis was performed following the standard protocol during bolus administration of intravenous contrast.  Contrast: OMNIPAQUE IOHEXOL 300 MG/ML  SOLN  Comparison:  09/20/2011  CT CHEST  Findings:  Postsurgical changes in the left hemithorax again noted. An area of confluent opacity in the posterior left midlung is stable in appearance and continues to show central air bronchograms. This remains consistent with post-treatment scarring/fibrosis.  No evidence of recurrent neoplasm at this site. No other suspicious pulmonary nodules or masses are identified. There is no evidence of acute infiltrate or pleural effusion.  Mild pericardial effusion or thickening remains stable. Moderate pulmonary emphysema again noted.  Increased mediastinal lymphadenopathy is seen in the right paratracheal region, now measuring 1.6 x 2.0 cm compared to 1.0 x 1.1 cm on previous study.  In addition to being larger, this adenopathy also shows central necrosis.  No other areas of lymphadenopathy are seen within the thorax.  No suspicious bone lesions are identified.  IMPRESSION:  1.  Increased mild mediastinal lymphadenopathy in the right paratracheal region, which also shows central necrosis. 2.  Stable post-treatment changes in the left lung.  No other sites of new or progressive disease identified.  CT ABDOMEN AND PELVIS  Findings:  The abdominal  parenchymal organs show no significant abnormality.  Tiny left renal cyst is stable.  No evidence of hydronephrosis.  Several small uterine fibroids again noted, but no other soft tissue masses are identified within the abdomen or pelvis.  No evidence of lymphadenopathy.  No evidence of inflammatory process or abnormal fluid collections.  Sigmoid diverticulosis again demonstrated, without evidence of diverticulitis.  Previously seen sclerotic metastasis in the left iliac wing a left sclerotic and more permeative appearing cyst which may be increased in  size.  This may represent progression of metastasis at the site. No other suspicious bone lesions are identified.  IMPRESSION:  1.  No evidence of soft tissue metastatic disease within the abdomen or pelvis. 2.  More lytic and permeative appearance of previously seen sclerotic metastasis in the left ilium. This raises the question of progression of bone metastasis at this site, although no other suspicious bone lesions are identified.  If clinically indicated, bone scan or PET CT scan could be performed for further evaluation. 3.  Stable small uterine fibroids and sigmoid diverticulosis.   Original Report Authenticated By: Danae Orleans, M.D.    Nm Myocar Multi W/spect W/wall Motion / Ef  01/21/2012  Clinical Data:  Chest pain.  Technique:  Standard myocardial SPECT imaging performed after resting intravenous injection of Tc-44m tetrofosmin.  Subsequently, stress in the form of Lexiscan  was administered under the supervision of the Cardiology staff.  At peak stress, Tc-57m tetrofosmin was injected intravenously and standard myocardial SPECT imaging performed.  Quantitative gated imaging also performed to evaluate left ventricular wall motion and estimate left ventricular ejection fraction.  Radiopharmaceutical: Tc-30m tetrofosmin, 10 mCi at rest and 30 mCi at stress.  Comparison:  04/17/2011  MYOCARDIAL IMAGING WITH SPECT (REST AND STRESS)  Findings:  Faintly  reduced anteroseptal activity on stress and rest images is attributed to breast attenuation.  No inducible ischemia observed.  LEFT VENTRICULAR EJECTION FRACTION  Findings:  Left ventricular end-diastolic volume is 44 cc.  End- systolic volume is 10 cc.  Derived LV ejection fraction is 78%.  GATED LEFT VENTRICULAR WALL MOTION STUDY  Findings:  Left ventricular wall motion and wall thickening appear normal.  IMPRESSION:  1.  No significant abnormality observed.  Faintly reduced anteroseptal activity on stress and rest images is attributed to breast attenuation.   Original Report Authenticated By: Dellia Cloud, M.D.     ASSESSMENT: This is a very pleasant 61 years old African American female with recurrent non-small cell lung cancer status post several regimen of concurrent chemoradiation as well as systemic chemotherapy most recently treated with 6 cycles of chemotherapy with single agent docetaxel completed in May of 2013 with significant improvement in her disease. The patient has some evidence for disease progression in the right paratracheal lymph node.   PLAN: I discussed the scan results with the patient today. I gave her the option of continuous observation during the holiday as the patient has plans with her family for the holiday versus resuming systemic chemotherapy. The patient elected to continue on observation for now. I would see her back for followup visit in 6 weeks with repeat CT scan of the chest with contrast for evaluation of the right paratracheal lymph node. She was advised to call me immediately if she has any concerning symptoms in the interval. She will receive flu vaccine in the clinic today.  All questions were answered. The patient knows to call the clinic with any problems, questions or concerns. We can certainly see the patient much sooner if necessary.

## 2012-02-21 ENCOUNTER — Ambulatory Visit: Payer: Self-pay

## 2012-02-24 ENCOUNTER — Other Ambulatory Visit

## 2012-02-24 ENCOUNTER — Other Ambulatory Visit: Payer: Self-pay

## 2012-02-24 ENCOUNTER — Ambulatory Visit (INDEPENDENT_AMBULATORY_CARE_PROVIDER_SITE_OTHER)

## 2012-02-24 DIAGNOSIS — Z780 Asymptomatic menopausal state: Secondary | ICD-10-CM

## 2012-02-24 DIAGNOSIS — M810 Age-related osteoporosis without current pathological fracture: Secondary | ICD-10-CM

## 2012-02-27 ENCOUNTER — Ambulatory Visit
Admission: RE | Admit: 2012-02-27 | Discharge: 2012-02-27 | Disposition: A | Discharge status: Home / Self Care | Source: Ambulatory Visit | Attending: Cardiovascular Disease | Admitting: Cardiovascular Disease

## 2012-02-27 DIAGNOSIS — Z1231 Encounter for screening mammogram for malignant neoplasm of breast: Secondary | ICD-10-CM

## 2012-03-30 ENCOUNTER — Ambulatory Visit: Payer: Self-pay | Admitting: Internal Medicine

## 2012-04-01 ENCOUNTER — Ambulatory Visit (HOSPITAL_COMMUNITY)
Admission: RE | Admit: 2012-04-01 | Discharge: 2012-04-01 | Disposition: A | Discharge status: Home / Self Care | Source: Ambulatory Visit | Attending: Internal Medicine | Admitting: Internal Medicine

## 2012-04-01 ENCOUNTER — Other Ambulatory Visit (HOSPITAL_BASED_OUTPATIENT_CLINIC_OR_DEPARTMENT_OTHER): Admitting: Lab

## 2012-04-01 DIAGNOSIS — C3490 Malignant neoplasm of unspecified part of unspecified bronchus or lung: Secondary | ICD-10-CM

## 2012-04-01 DIAGNOSIS — I319 Disease of pericardium, unspecified: Secondary | ICD-10-CM | POA: Insufficient documentation

## 2012-04-01 DIAGNOSIS — Z9221 Personal history of antineoplastic chemotherapy: Secondary | ICD-10-CM | POA: Insufficient documentation

## 2012-04-01 DIAGNOSIS — Z923 Personal history of irradiation: Secondary | ICD-10-CM | POA: Insufficient documentation

## 2012-04-01 DIAGNOSIS — C3430 Malignant neoplasm of lower lobe, unspecified bronchus or lung: Secondary | ICD-10-CM

## 2012-04-01 LAB — CBC WITH DIFFERENTIAL/PLATELET
Basophils Absolute: 0 10*3/uL (ref 0.0–0.1)
EOS%: 2.1 % (ref 0.0–7.0)
HCT: 37.8 % (ref 34.8–46.6)
HGB: 13.2 g/dL (ref 11.6–15.9)
MCH: 32.3 pg (ref 25.1–34.0)
MONO#: 0.3 10*3/uL (ref 0.1–0.9)
NEUT#: 1.8 10*3/uL (ref 1.5–6.5)
RDW: 12.9 % (ref 11.2–14.5)
WBC: 3.2 10*3/uL — ABNORMAL LOW (ref 3.9–10.3)
lymph#: 1 10*3/uL (ref 0.9–3.3)

## 2012-04-01 LAB — COMPREHENSIVE METABOLIC PANEL (CC13)
ALT: 9 U/L (ref 0–55)
AST: 19 U/L (ref 5–34)
Albumin: 3.6 g/dL (ref 3.5–5.0)
BUN: 14 mg/dL (ref 7.0–26.0)
CO2: 26 mEq/L (ref 22–29)
Calcium: 9 mg/dL (ref 8.4–10.4)
Chloride: 104 mEq/L (ref 98–107)
Potassium: 4.1 mEq/L (ref 3.5–5.1)

## 2012-04-01 MED ORDER — IOHEXOL 300 MG/ML  SOLN
80.0000 mL | Freq: Once | INTRAMUSCULAR | Status: AC | PRN
  Administered 2012-04-01: 80 mL via INTRAVENOUS

## 2012-04-06 ENCOUNTER — Ambulatory Visit (HOSPITAL_BASED_OUTPATIENT_CLINIC_OR_DEPARTMENT_OTHER): Admitting: Internal Medicine

## 2012-04-06 ENCOUNTER — Ambulatory Visit (HOSPITAL_BASED_OUTPATIENT_CLINIC_OR_DEPARTMENT_OTHER)

## 2012-04-06 ENCOUNTER — Telehealth: Payer: Self-pay | Admitting: *Deleted

## 2012-04-06 ENCOUNTER — Telehealth: Payer: Self-pay | Admitting: Internal Medicine

## 2012-04-06 VITALS — BP 119/73 | HR 85 | Temp 96.6°F | Resp 20 | Ht 66.0 in | Wt 165.9 lb

## 2012-04-06 DIAGNOSIS — C3430 Malignant neoplasm of lower lobe, unspecified bronchus or lung: Secondary | ICD-10-CM

## 2012-04-06 DIAGNOSIS — C3490 Malignant neoplasm of unspecified part of unspecified bronchus or lung: Secondary | ICD-10-CM

## 2012-04-06 MED ORDER — CYANOCOBALAMIN 1000 MCG/ML IJ SOLN
1000.0000 ug | Freq: Once | INTRAMUSCULAR | Status: AC
  Administered 2012-04-06: 1000 ug via INTRAMUSCULAR

## 2012-04-06 MED ORDER — DEXAMETHASONE 4 MG PO TABS: ORAL_TABLET | ORAL | Status: DC

## 2012-04-06 NOTE — Telephone Encounter (Signed)
gv and printed appt schedule for pt for Dec and Jan 2014....emailed michelle to add chemo..the patient aware °

## 2012-04-06 NOTE — Telephone Encounter (Signed)
Per staff message and POF I have scheedule appts. JMW

## 2012-04-06 NOTE — Patient Instructions (Signed)
Your scan showed further progression of her disease. We discussed treatment options including systemic chemotherapy with carboplatin and Alimta. First cycle after Christmas. Followup in 3 weeks

## 2012-04-06 NOTE — Progress Notes (Signed)
[facility] Telephone:(336) [phone]   Fax:(336) [phone]  OFFICE PROGRESS NOTE  [name], MD [address] [place] [address]  DIAGNOSIS: Recurrent non-small cell lung cancer initially diagnosed as stage IIA (T1 M1 MX) adenocarcinoma in April 2011.   PRIOR THERAPY:  1. Status post 3 cycles of neoadjuvant chemotherapy with carboplatin and Alimta. Last dose was given July 23, 2009. 2. Status post left lower lobectomy with lymph node dissection under the care of Dr. Edwyna Shell on October 17, 2009. 3. Status post concurrent chemoradiation with weekly carboplatin and paclitaxel for disease recurrence. Last dose was given May 07, 2010. 4. Status post 3 cycles of consolidation chemotherapy with carboplatin and Alimta. Last dose was given August 13, 2010. 5. Status post palliative radiotherapy to the right neck area. 6. Systemic chemotherapy with single agent Taxotere 75 mg/m2 every 3 weeks,, status post 6 cycles, last dose was given on 09/03/2011.  CURRENT THERAPY: Observation.  INTERVAL HISTORY: Kaitlin Reed 61 y.o. female returns to the clinic today for routine two-month followup visit. The patient is feeling fine today with no specific complaints. She denied having any significant weight loss or night sweats. She has no weakness or fatigue. She has no significant chest pain, shortness breath, cough or hemoptysis. The patient had repeat CT scan of the chest performed recently and she is here today for evaluation and discussion of her scan results.  MEDICAL HISTORY: Past Medical History  Diagnosis Date  . Heart murmur   . Tubal pregnancy   . lung ca dx'd 06/2009    chemo comp 07/2010; xrt comp 06/2010  . Hypercholesteremia     h/o    ALLERGIES:  is allergic to codeine; motrin; other; trazodone and nefazodone; banana; and tape.  MEDICATIONS:  Current Outpatient Prescriptions  Medication Sig Dispense Refill  . Ascorbic Acid (VITAMIN C) 1000 MG tablet  Take 1,000 mg by mouth daily.      Marland Kitchen aspirin EC 81 MG tablet Take 81 mg by mouth daily.      . calcium carbonate (OS-CAL) 600 MG TABS Take 600 mg by mouth 2 (two) times daily with a meal.      . Cyanocobalamin (B-12 PO) Take 1 tablet by mouth daily.      . fluconazole (DIFLUCAN) 150 MG tablet Take 1 tablet (150 mg total) by mouth once.  1 tablet  1  . folic acid (FOLVITE) 1 MG tablet TAKE 1 TABLET EVERY DAY  30 tablet  3  . IRON COMBINATIONS PO Take 1 tablet by mouth daily.       . metoprolol tartrate (LOPRESSOR) 25 MG tablet Take 25 mg by mouth 2 (two) times daily.      . Multiple Vitamin (MULITIVITAMIN WITH MINERALS) TABS Take 1 tablet by mouth daily.        . potassium chloride (MICRO-K) 10 MEQ CR capsule Take 5 mEq by mouth daily.       Marland Kitchen VITAMIN D, CHOLECALCIFEROL, PO Take 1 tablet by mouth daily.       Marland Kitchen VITAMIN E PO Take 1 tablet by mouth daily.      . [DISCONTINUED] Alum & Mag Hydroxide-Simeth (MAGIC MOUTHWASH W/LIDOCAINE) SOLN Take 10 mLs by mouth 4 (four) times daily as needed.  500 mL  5    SURGICAL HISTORY:  Past Surgical History  Procedure Date  . Lung removal, partial 10/17/09    left. pt states they removed small piece of lung due to  cancer  . 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   PHYSICAL EXAMINATION: General appearance: alert, cooperative and no distress Head: Normocephalic, without obvious abnormality, atraumatic Lymph nodes: Cervical, supraclavicular, and axillary nodes normal. Resp: clear to auscultation bilaterally Cardio: regular rate and rhythm, S1, S2 normal, no murmur, click, rub or gallop GI: soft, non-tender; bowel sounds normal; no masses,  no organomegaly Extremities: extremities normal, atraumatic, no cyanosis or edema Neurologic: Alert and oriented X 3, normal strength and tone. Normal symmetric reflexes. Normal coordination and gait  ECOG PERFORMANCE STATUS: 0 -  Asymptomatic  There were no vitals taken for this visit.  LABORATORY DATA: Lab Results  Component Value Date   WBC 3.2* 04/01/2012   HGB 13.2 04/01/2012   HCT 37.8 04/01/2012   MCV 92.4 04/01/2012   PLT 265 04/01/2012      Chemistry      Component Value Date/Time   NA 141 04/01/2012 0848   NA 141 01/21/2012 0557   NA 138 11/06/2011 1121   K 4.1 04/01/2012 0848   K 3.9 01/21/2012 0557   K 4.3 11/06/2011 1121   CL 104 04/01/2012 0848   CL 105 01/21/2012 0557   CL 98 11/06/2011 1121   CO2 26 04/01/2012 0848   CO2 26 01/21/2012 0557   CO2 30 11/06/2011 1121   BUN 14.0 04/01/2012 0848   BUN 13 01/21/2012 0557   BUN 15 11/06/2011 1121   CREATININE 1.0 04/01/2012 0848   CREATININE 0.87 01/21/2012 0557   CREATININE 0.9 11/06/2011 1121      Component Value Date/Time   CALCIUM 9.0 04/01/2012 0848   CALCIUM 9.8 01/21/2012 0557   CALCIUM 9.0 11/06/2011 1121   ALKPHOS 169* 04/01/2012 0848   ALKPHOS 129* 01/20/2012 1926   ALKPHOS 110* 11/06/2011 1121   AST 19 04/01/2012 0848   AST 17 01/20/2012 1926   AST 36 11/06/2011 1121   ALT 9 04/01/2012 0848   ALT 6 01/20/2012 1926   BILITOT 0.35 04/01/2012 0848   BILITOT 0.2* 01/20/2012 1926   BILITOT 0.50 11/06/2011 1121       RADIOGRAPHIC STUDIES: Ct Chest W Contrast  04/01/2012  *RADIOLOGY REPORT*  Clinical Data: Follow-up lung cancer.  Chemotherapy and radiation therapy complete.  CT CHEST WITH CONTRAST  Technique:  Multidetector CT imaging of the chest was performed following the standard protocol during bolus administration of intravenous contrast.  Contrast: 80mL OMNIPAQUE IOHEXOL 300 MG/ML  SOLN  Comparison: 02/06/2012.  Findings: Mediastinal lymph nodes have enlarged in the interval, now measuring up to 1.9 cm in the low right paratracheal station (previously 1.6 cm).  No hilar or axillary adenopathy.  Small pericardial effusion, stable.  Heart size normal.  Coronary artery calcification.  Emphysema. Nodular densities in the medial right upper lobe measure up  to 6 x 8 mm (previously 5 x 5 mm). Postoperative changes of left lower lobectomy.  A tiny subpleural nodule in the left upper lobe measures 4 mm (previously 2 mm).  Radiation changes in the left perihilar region, with associated volume loss.  No pleural fluid. Airway is otherwise unremarkable.  Incidental imaging of the upper abdomen shows tiny low density lesions in the hepatic dome, likely stable.  No worrisome lytic or sclerotic lesions.  IMPRESSION:  1.  Interval progression of metastatic disease as evidenced by enlarging mediastinal lymph nodes and bilateral pulmonary nodules. 2.  Small pericardial effusion,  stable.   Original Report Authenticated By: Leanna Battles, M.D.     ASSESSMENT: This is a very pleasant 61 years old African American female with recurrent non-small cell lung cancer status post several chemotherapy regimen and recently she has some evidence for disease progression.  PLAN: I discussed the scan results with the patient. I recommended for her to consider systemic chemotherapy. I gave her several options including resuming treatment with single agent docetaxel versus treatment with carboplatin and Alimta. The patient is concerned about hair loss and she would've preferred treatment with carboplatin and Alimta. I discussed with her the adverse effect of this treatment including mild alopecia, myelosuppression, nausea and vomiting, peripheral neuropathy, liver or in dysfunction. She would like to start the first cycle of this treatment after Christmas. She will receive vitamin B12 injection today. I will call her pharmacy with refill for Decadron 4 mg by mouth twice a day day before, day of and day after the chemotherapy. The patient will continue her current treatment with folic acid 1 mg by mouth daily. She would come back for followup visit in 3 weeks. All questions were answered. The patient knows to call the clinic with any problems, questions or concerns. We can certainly see the  patient much sooner if necessary.  I spent 15 minutes counseling the patient face to face. The total time spent in the appointment was 25 minutes.

## 2012-04-11 ENCOUNTER — Other Ambulatory Visit: Payer: Self-pay | Admitting: Internal Medicine

## 2012-04-13 ENCOUNTER — Telehealth: Payer: Self-pay | Admitting: *Deleted

## 2012-04-13 NOTE — Telephone Encounter (Signed)
Pt's dentist Dr. Excell Seltzer sent form for Dr Donnald Garre to fill out and sign regarding pt having 2 teeth pulled within the next day or 2.  Dr Donnald Garre filled out form, and form was faxed back to [phone].  Dr Donnald Garre wrote that from oncology standpoint no contraindication for the procedure, medical clearance is needed from her PCP to answer the other questions noted on the form regarding using lidocaine and local anesthetics.  Pt will pick up copy of letter tomorrow.  Copy left at the front desk.  SLJ

## 2012-04-23 ENCOUNTER — Telehealth: Payer: Self-pay | Admitting: Medical Oncology

## 2012-04-23 ENCOUNTER — Encounter: Payer: Self-pay | Admitting: Pharmacist

## 2012-04-23 NOTE — Telephone Encounter (Signed)
Recent teeth extraction with dental follow-up today . Asking if she needs to keep chemo appt next Monday . I told her yes .

## 2012-04-27 ENCOUNTER — Encounter: Payer: Self-pay | Admitting: Physician Assistant

## 2012-04-27 ENCOUNTER — Other Ambulatory Visit (HOSPITAL_BASED_OUTPATIENT_CLINIC_OR_DEPARTMENT_OTHER): Admitting: Lab

## 2012-04-27 ENCOUNTER — Telehealth: Payer: Self-pay | Admitting: Internal Medicine

## 2012-04-27 ENCOUNTER — Ambulatory Visit (HOSPITAL_BASED_OUTPATIENT_CLINIC_OR_DEPARTMENT_OTHER): Admitting: Physician Assistant

## 2012-04-27 ENCOUNTER — Ambulatory Visit (HOSPITAL_BASED_OUTPATIENT_CLINIC_OR_DEPARTMENT_OTHER)

## 2012-04-27 VITALS — BP 132/77 | HR 110 | Temp 96.8°F | Resp 18 | Ht 66.0 in | Wt 161.0 lb

## 2012-04-27 DIAGNOSIS — Z5111 Encounter for antineoplastic chemotherapy: Secondary | ICD-10-CM

## 2012-04-27 DIAGNOSIS — C3430 Malignant neoplasm of lower lobe, unspecified bronchus or lung: Secondary | ICD-10-CM

## 2012-04-27 DIAGNOSIS — C3490 Malignant neoplasm of unspecified part of unspecified bronchus or lung: Secondary | ICD-10-CM

## 2012-04-27 DIAGNOSIS — C770 Secondary and unspecified malignant neoplasm of lymph nodes of head, face and neck: Secondary | ICD-10-CM

## 2012-04-27 LAB — CBC WITH DIFFERENTIAL/PLATELET
BASO%: 0 % (ref 0.0–2.0)
EOS%: 0 % (ref 0.0–7.0)
HGB: 14.1 g/dL (ref 11.6–15.9)
MCH: 31 pg (ref 25.1–34.0)
MCHC: 34.6 g/dL (ref 31.5–36.0)
RDW: 11.9 % (ref 11.2–14.5)
WBC: 3.7 10*3/uL — ABNORMAL LOW (ref 3.9–10.3)
lymph#: 0.3 10*3/uL — ABNORMAL LOW (ref 0.9–3.3)

## 2012-04-27 LAB — COMPREHENSIVE METABOLIC PANEL (CC13)
ALT: <6 U/L (ref 0–55)
AST: 17 U/L (ref 5–34)
Albumin: 4 g/dL (ref 3.5–5.0)
Alkaline Phosphatase: 223 U/L — ABNORMAL HIGH (ref 40–150)
Potassium: 4.1 mEq/L (ref 3.5–5.1)
Sodium: 140 mEq/L (ref 136–145)
Total Protein: 8 g/dL (ref 6.4–8.3)

## 2012-04-27 MED ORDER — ONDANSETRON 16 MG/50ML IVPB (CHCC)
16.0000 mg | Freq: Once | INTRAVENOUS | Status: AC
  Administered 2012-04-27: 16 mg via INTRAVENOUS

## 2012-04-27 MED ORDER — SODIUM CHLORIDE 0.9 % IV SOLN
467.0000 mg | Freq: Once | INTRAVENOUS | Status: AC
  Administered 2012-04-27: 470 mg via INTRAVENOUS
  Filled 2012-04-27: qty 47

## 2012-04-27 MED ORDER — CARBOPLATIN CHEMO INTRADERMAL TEST DOSE 100MCG/0.02ML
100.0000 ug | Freq: Once | INTRADERMAL | Status: AC
  Administered 2012-04-27: 100 ug via INTRADERMAL
  Filled 2012-04-27: qty 0.01

## 2012-04-27 MED ORDER — SODIUM CHLORIDE 0.9 % IV SOLN
500.0000 mg/m2 | Freq: Once | INTRAVENOUS | Status: AC
  Administered 2012-04-27: 925 mg via INTRAVENOUS
  Filled 2012-04-27: qty 37

## 2012-04-27 MED ORDER — SODIUM CHLORIDE 0.9 % IV SOLN
Freq: Once | INTRAVENOUS | Status: AC
  Administered 2012-04-27: 15:00:00 via INTRAVENOUS

## 2012-04-27 MED ORDER — DEXAMETHASONE SODIUM PHOSPHATE 10 MG/ML IJ SOLN
20.0000 mg | Freq: Once | INTRAMUSCULAR | Status: AC
  Administered 2012-04-27: 20 mg via INTRAVENOUS

## 2012-04-27 NOTE — Patient Instructions (Addendum)
Follow up in 3 weeks prior to your next cycle of chemotherapy 

## 2012-04-27 NOTE — Patient Instructions (Addendum)
[facility] Discharge Instructions for Patients Receiving Chemotherapy  Today you received the following chemotherapy agents alimta,carboplatin To help prevent nausea and vomiting after your treatment, we encourage you to take your nausea medication   Take it as often as prescribed.   If you develop nausea and vomiting that is not controlled by your nausea medication, call the clinic. If it is after clinic hours your family physician or the after hours number for the clinic or go to the Emergency Department.   BELOW ARE SYMPTOMS THAT SHOULD BE REPORTED IMMEDIATELY:  *FEVER GREATER THAN 100.5 F  *CHILLS WITH OR WITHOUT FEVER  NAUSEA AND VOMITING THAT IS NOT CONTROLLED WITH YOUR NAUSEA MEDICATION  *UNUSUAL SHORTNESS OF BREATH  *UNUSUAL BRUISING OR BLEEDING  TENDERNESS IN MOUTH AND THROAT WITH OR WITHOUT PRESENCE OF ULCERS  *URINARY PROBLEMS  *BOWEL PROBLEMS  UNUSUAL RASH Items with * indicate a potential emergency and should be followed up as soon as possible.  If this is your first treatment one of the nurses will contact you 24 hours after your treatment. Please let the nurse know about any problems that you may have experienced. Feel free to call the clinic you have any questions or concerns. The clinic phone number is [phone].   I have been informed and understand all the instructions given to me. I know to contact the clinic, my physician, or go to the Emergency Department if any problems should occur. I do not have any questions at this time, but understand that I may call the clinic during office hours   should I have any questions or need assistance in obtaining follow up care.    __________________________________________  _____________  __________ Signature of Patient or Authorized Representative            Date                   Time    __________________________________________ Nurse's Signature    Carboplatin injection What is this  medicine? CARBOPLATIN (KAR boe pla tin) is a chemotherapy drug. It targets fast dividing cells, like cancer cells, and causes these cells to die. This medicine is used to treat ovarian cancer and many other cancers. This medicine may be used for other purposes; ask your health care provider or pharmacist if you have questions. What should I tell my health care provider before I take this medicine? They need to know if you have any of these conditions: -blood disorders -hearing problems -kidney disease -recent or ongoing radiation therapy -an unusual or allergic reaction to carboplatin, cisplatin, other chemotherapy, other medicines, foods, dyes, or preservatives -pregnant or trying to get pregnant -breast-feeding How should I use this medicine? This drug is usually given as an infusion into a vein. It is administered in a hospital or clinic by a specially trained health care professional. Talk to your pediatrician regarding the use of this medicine in children. Special care may be needed. Overdosage: If you think you have taken too much of this medicine contact a poison control center or emergency room at once. NOTE: This medicine is only for you. Do not share this medicine with others. What if I miss a dose? It is important not to miss a dose. Call your doctor or health care professional if you are unable to keep an appointment. What may interact with this medicine? -medicines for seizures -medicines to increase blood counts like filgrastim, pegfilgrastim, sargramostim -some antibiotics like amikacin, gentamicin, neomycin, streptomycin, tobramycin -vaccines  Talk to your doctor or health care professional before taking any of these medicines: -acetaminophen -aspirin -ibuprofen -ketoprofen -naproxen This list may not describe all possible interactions. Give your health care provider a list of all the medicines, herbs, non-prescription drugs, or dietary supplements you use. Also tell them  if you smoke, drink alcohol, or use illegal drugs. Some items may interact with your medicine. What should I watch for while using this medicine? Your condition will be monitored carefully while you are receiving this medicine. You will need important blood work done while you are taking this medicine. This drug may make you feel generally unwell. This is not uncommon, as chemotherapy can affect healthy cells as well as cancer cells. Report any side effects. Continue your course of treatment even though you feel ill unless your doctor tells you to stop. In some cases, you may be given additional medicines to help with side effects. Follow all directions for their use. Call your doctor or health care professional for advice if you get a fever, chills or sore throat, or other symptoms of a cold or flu. Do not treat yourself. This drug decreases your body's ability to fight infections. Try to avoid being around people who are sick. This medicine may increase your risk to bruise or bleed. Call your doctor or health care professional if you notice any unusual bleeding. Be careful brushing and flossing your teeth or using a toothpick because you may get an infection or bleed more easily. If you have any dental work done, tell your dentist you are receiving this medicine. Avoid taking products that contain aspirin, acetaminophen, ibuprofen, naproxen, or ketoprofen unless instructed by your doctor. These medicines may hide a fever. Do not become pregnant while taking this medicine. Women should inform their doctor if they wish to become pregnant or think they might be pregnant. There is a potential for serious side effects to an unborn child. Talk to your health care professional or pharmacist for more information. Do not breast-feed an infant while taking this medicine. What side effects may I notice from receiving this medicine? Side effects that you should report to your doctor or health care professional as  soon as possible: -allergic reactions like skin rash, itching or hives, swelling of the face, lips, or tongue -signs of infection - fever or chills, cough, sore throat, pain or difficulty passing urine -signs of decreased platelets or bleeding - bruising, pinpoint red spots on the skin, black, tarry stools, nosebleeds -signs of decreased red blood cells - unusually weak or tired, fainting spells, lightheadedness -breathing problems -changes in hearing -changes in vision -chest pain -high blood pressure -low blood counts - This drug may decrease the number of white blood cells, red blood cells and platelets. You may be at increased risk for infections and bleeding. -nausea and vomiting -pain, swelling, redness or irritation at the injection site -pain, tingling, numbness in the hands or feet -problems with balance, talking, walking -trouble passing urine or change in the amount of urine Side effects that usually do not require medical attention (report to your doctor or health care professional if they continue or are bothersome): -hair loss -loss of appetite -metallic taste in the mouth or changes in taste This list may not describe all possible side effects. Call your doctor for medical advice about side effects. You may report side effects to FDA at 1-800-FDA-1088. Where should I keep my medicine? This drug is given in a hospital or clinic and will not be  stored at home. NOTE: This sheet is a summary. It may not cover all possible information. If you have questions about this medicine, talk to your doctor, pharmacist, or health care provider.  2012, Elsevier/Gold Standard. (07/21/2007 2:38:05 PM)  Pemetrexed injection What is this medicine? PEMETREXED (PEM e TREX ed) is a chemotherapy drug. This medicine affects cells that are rapidly growing, such as cancer cells and cells in your mouth and stomach. It is usually used to treat lung cancers like non-small cell lung cancer and  mesothelioma. It may also be used to treat other cancers. This medicine may be used for other purposes; ask your health care provider or pharmacist if you have questions. What should I tell my health care provider before I take this medicine? They need to know if you have any of these conditions: -if you frequently drink alcohol containing beverages -infection (especially a virus infection such as chickenpox, cold sores, or herpes) -kidney disease -liver disease -low blood counts, like low platelets, red bloods, or white blood cells -an unusual or allergic reaction to pemetrexed, mannitol, other medicines, foods, dyes, or preservatives -pregnant or trying to get pregnant -breast-feeding How should I use this medicine? This drug is given as an infusion into a vein. It is administered in a hospital or clinic by a specially trained health care professional. Talk to your pediatrician regarding the use of this medicine in children. Special care may be needed. Overdosage: If you think you have taken too much of this medicine contact a poison control center or emergency room at once. NOTE: This medicine is only for you. Do not share this medicine with others. What if I miss a dose? It is important not to miss your dose. Call your doctor or health care professional if you are unable to keep an appointment. What may interact with this medicine? -aspirin and aspirin-like medicines -medicines to increase blood counts like filgrastim, pegfilgrastim, sargramostim -methotrexate -NSAIDS, medicines for pain and inflammation, like ibuprofen or naproxen -probenecid -pyrimethamine -vaccines Talk to your doctor or health care professional before taking any of these medicines: -acetaminophen -aspirin -ibuprofen -ketoprofen -naproxen This list may not describe all possible interactions. Give your health care provider a list of all the medicines, herbs, non-prescription drugs, or dietary supplements you  use. Also tell them if you smoke, drink alcohol, or use illegal drugs. Some items may interact with your medicine. What should I watch for while using this medicine? Visit your doctor for checks on your progress. This drug may make you feel generally unwell. This is not uncommon, as chemotherapy can affect healthy cells as well as cancer cells. Report any side effects. Continue your course of treatment even though you feel ill unless your doctor tells you to stop. In some cases, you may be given additional medicines to help with side effects. Follow all directions for their use. Call your doctor or health care professional for advice if you get a fever, chills or sore throat, or other symptoms of a cold or flu. Do not treat yourself. This drug decreases your body's ability to fight infections. Try to avoid being around people who are sick. This medicine may increase your risk to bruise or bleed. Call your doctor or health care professional if you notice any unusual bleeding. Be careful brushing and flossing your teeth or using a toothpick because you may get an infection or bleed more easily. If you have any dental work done, tell your dentist you are receiving this medicine. Avoid taking  products that contain aspirin, acetaminophen, ibuprofen, naproxen, or ketoprofen unless instructed by your doctor. These medicines may hide a fever. Call your doctor or health care professional if you get diarrhea or mouth sores. Do not treat yourself. To protect your kidneys, drink water or other fluids as directed while you are taking this medicine. Men and women must use effective birth control while taking this medicine. You may also need to continue using effective birth control for a time after stopping this medicine. Do not become pregnant while taking this medicine. Tell your doctor right away if you think that you or your partner might be pregnant. There is a potential for serious side effects to an unborn child.  Talk to your health care professional or pharmacist for more information. Do not breast-feed an infant while taking this medicine. This medicine may lower sperm counts. What side effects may I notice from receiving this medicine? Side effects that you should report to your doctor or health care professional as soon as possible: -allergic reactions like skin rash, itching or hives, swelling of the face, lips, or tongue -low blood counts - this medicine may decrease the number of white blood cells, red blood cells and platelets. You may be at increased risk for infections and bleeding. -signs of infection - fever or chills, cough, sore throat, pain or difficulty passing urine -signs of decreased platelets or bleeding - bruising, pinpoint red spots on the skin, black, tarry stools, blood in the urine -signs of decreased red blood cells - unusually weak or tired, fainting spells, lightheadedness -breathing problems, like a dry cough -changes in emotions or moods -chest pain -confusion -diarrhea -high blood pressure -mouth or throat sores or ulcers -pain, swelling, warmth in the leg -pain on swallowing -swelling of the ankles, feet, hands -trouble passing urine or change in the amount of urine -vomiting -yellowing of the eyes or skin Side effects that usually do not require medical attention (report to your doctor or health care professional if they continue or are bothersome): -hair loss -loss of appetite -nausea -stomach upset This list may not describe all possible side effects. Call your doctor for medical advice about side effects. You may report side effects to FDA at 1-800-FDA-1088. Where should I keep my medicine? This drug is given in a hospital or clinic and will not be stored at home. NOTE: This sheet is a summary. It may not cover all possible information. If you have questions about this medicine, talk to your doctor, pharmacist, or health care provider.  2012, Elsevier/Gold  Standard. (11/17/2007 1:24:03 PM)

## 2012-04-27 NOTE — Telephone Encounter (Signed)
gv and printed appt schedule for pt for Jan 2014. °

## 2012-04-27 NOTE — Progress Notes (Signed)
Carbo test dose negative at 5,15 30 minutes

## 2012-04-28 ENCOUNTER — Telehealth: Payer: Self-pay | Admitting: *Deleted

## 2012-04-28 NOTE — Progress Notes (Signed)
[facility] [phone]   Fax:([phone]) [phone]  OFFICE PROGRESS NOTE  [name], MD [address] [place] [address]  DIAGNOSIS: Recurrent non-small cell lung cancer initially diagnosed as stage IIA (T1 M1 MX) adenocarcinoma in April 2011.   PRIOR THERAPY:  1. Status post 3 cycles of neoadjuvant chemotherapy with carboplatin and Alimta. Last dose was given July 23, 2009. 2. Status post left lower lobectomy with lymph node dissection under the care of Dr. Edwyna Shell on October 17, 2009. 3. Status post concurrent chemoradiation with weekly carboplatin and paclitaxel for disease recurrence. Last dose was given May 07, 2010. 4. Status post 3 cycles of consolidation chemotherapy with carboplatin and Alimta. Last dose was given August 13, 2010. 5. Status post palliative radiotherapy to the right neck area. 6. Systemic chemotherapy with single agent Taxotere 75 mg/m2 every 3 weeks,, status post 6 cycles, last dose was given on 09/03/2011.  CURRENT THERAPY: Systemic chemotherapy with carboplatin for an AUC of 5 and Alimta at 500 mg per meter square given every 3 weeks, for cycle to be given today  INTERVAL HISTORY: Kaitlin Reed 61 y.o. female returns to the clinic today for a followup visit to proceed with her first cycle of systemic chemotherapy with carboplatin and Alimta. The patient is feeling fine today with no specific complaints. She denied having any significant weight loss or night sweats. She has no weakness or fatigue. She has no significant chest pain, shortness breath, cough or hemoptysis. She did have 2 teeth extracted 04/14/2012. She saw her dentist Dr. Hazeline Junker on 04/24/2012 who told her that it while not completely healed the sockets were progressing nicely. She still has some soreness from the extractions but voices no other complaints.   MEDICAL HISTORY: Past Medical History  Diagnosis Date  . Heart murmur   . Tubal pregnancy   .  lung ca dx'd 06/2009    chemo comp 07/2010; xrt comp 06/2010  . Hypercholesteremia     h/o    ALLERGIES:  is allergic to codeine; motrin; other; trazodone and nefazodone; banana; and tape.  MEDICATIONS:  Current Outpatient Prescriptions  Medication Sig Dispense Refill  . Ascorbic Acid (VITAMIN C) 1000 MG tablet Take 1,000 mg by mouth daily.      Marland Kitchen aspirin EC 81 MG tablet Take 81 mg by mouth daily.      . calcium carbonate (OS-CAL) 600 MG TABS Take 600 mg by mouth 2 (two) times daily with a meal.      . Cyanocobalamin (B-12 PO) Take 1 tablet by mouth daily.      Marland Kitchen dexamethasone (DECADRON) 4 MG tablet one tablet by mouth twice a day the day before, day of and day after the chemotherapy  40 tablet  1  . fluconazole (DIFLUCAN) 150 MG tablet Take 1 tablet (150 mg total) by mouth once.  1 tablet  1  . folic acid (FOLVITE) 1 MG tablet TAKE 1 TABLET EVERY DAY  30 tablet  3  . IRON COMBINATIONS PO Take 1 tablet by mouth daily.       . metoprolol tartrate (LOPRESSOR) 25 MG tablet Take 25 mg by mouth 2 (two) times daily.      . Multiple Vitamin (MULITIVITAMIN WITH MINERALS) TABS Take 1 tablet by mouth daily.        . potassium chloride (MICRO-K) 10 MEQ CR capsule Take 5 mEq by mouth daily.       Marland Kitchen VITAMIN D,  CHOLECALCIFEROL, PO Take 1 tablet by mouth daily.       Marland Kitchen VITAMIN E PO Take 1 tablet by mouth daily.      . [DISCONTINUED] Alum & Mag Hydroxide-Simeth (MAGIC MOUTHWASH W/LIDOCAINE) SOLN Take 10 mLs by mouth 4 (four) times daily as needed.  500 mL  5    SURGICAL HISTORY:  Past Surgical History  Procedure Date  . Lung removal, partial 10/17/09    left. pt states they removed small piece of lung due to cancer  . 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   PHYSICAL EXAMINATION: General appearance: alert, cooperative and no distress Head: Normocephalic, without obvious abnormality, atraumatic Lymph nodes: Cervical,  supraclavicular, and axillary nodes normal. Resp: clear to auscultation bilaterally Cardio: regular rate and rhythm, S1, S2 normal, no murmur, click, rub or gallop GI: soft, non-tender; bowel sounds normal; no masses,  no organomegaly Extremities: extremities normal, atraumatic, no cyanosis or edema Neurologic: Alert and oriented X 3, normal strength and tone. Normal symmetric reflexes. Normal coordination and gait  ECOG PERFORMANCE STATUS: 0 - Asymptomatic  Blood pressure 132/77, pulse 110, temperature 96.8 F (36 C), temperature source Oral, resp. rate 18, height 5\' 6"  (1.676 m), weight 161 lb (73.029 kg).  LABORATORY DATA: Lab Results  Component Value Date   WBC 3.7* 04/27/2012   HGB 14.1 04/27/2012   HCT 40.7 04/27/2012   MCV 89.5 04/27/2012   PLT 319 04/27/2012      Chemistry      Component Value Date/Time   NA 140 04/27/2012 1341   NA 141 01/21/2012 0557   NA 138 11/06/2011 1121   K 4.1 04/27/2012 1341   K 3.9 01/21/2012 0557   K 4.3 11/06/2011 1121   CL 105 04/27/2012 1341   CL 105 01/21/2012 0557   CL 98 11/06/2011 1121   CO2 25 04/27/2012 1341   CO2 26 01/21/2012 0557   CO2 30 11/06/2011 1121   BUN 15.0 04/27/2012 1341   BUN 13 01/21/2012 0557   BUN 15 11/06/2011 1121   CREATININE 1.0 04/27/2012 1341   CREATININE 0.87 01/21/2012 0557   CREATININE 0.9 11/06/2011 1121      Component Value Date/Time   CALCIUM 9.9 04/27/2012 1341   CALCIUM 9.8 01/21/2012 0557   CALCIUM 9.0 11/06/2011 1121   ALKPHOS 223* 04/27/2012 1341   ALKPHOS 129* 01/20/2012 1926   ALKPHOS 110* 11/06/2011 1121   AST 17 04/27/2012 1341   AST 17 01/20/2012 1926   AST 36 11/06/2011 1121   ALT <6 04/27/2012 1341   ALT 6 01/20/2012 1926   BILITOT 0.25 04/27/2012 1341   BILITOT 0.2* 01/20/2012 1926   BILITOT 0.50 11/06/2011 1121       RADIOGRAPHIC STUDIES: Ct Chest W Contrast  04/01/2012  *RADIOLOGY REPORT*  Clinical Data: Follow-up lung cancer.  Chemotherapy and radiation therapy complete.  CT CHEST  WITH CONTRAST  Technique:  Multidetector CT imaging of the chest was performed following the standard protocol during bolus administration of intravenous contrast.  Contrast: 80mL OMNIPAQUE IOHEXOL 300 MG/ML  SOLN  Comparison: 02/06/2012.  Findings: Mediastinal lymph nodes have enlarged in the interval, now measuring up to 1.9 cm in the low right paratracheal station (previously 1.6 cm).  No hilar or axillary adenopathy.  Small pericardial effusion, stable.  Heart size normal.  Coronary artery calcification.  Emphysema. Nodular densities in the medial right upper lobe measure  up to 6 x 8 mm (previously 5 x 5 mm). Postoperative changes of left lower lobectomy.  A tiny subpleural nodule in the left upper lobe measures 4 mm (previously 2 mm).  Radiation changes in the left perihilar region, with associated volume loss.  No pleural fluid. Airway is otherwise unremarkable.  Incidental imaging of the upper abdomen shows tiny low density lesions in the hepatic dome, likely stable.  No worrisome lytic or sclerotic lesions.  IMPRESSION:  1.  Interval progression of metastatic disease as evidenced by enlarging mediastinal lymph nodes and bilateral pulmonary nodules. 2.  Small pericardial effusion, stable.   Original Report Authenticated By: Leanna Battles, M.D.     ASSESSMENT/PLAN: This is a very pleasant 61 years old African American female with recurrent non-small cell lung cancer status post several chemotherapy regimen and recently she has some evidence for disease progression. The patient was discussed with Dr. Arbutus Ped. As it has been greater than one week since her teeth extractions, we will proceed with her first cycle of systemic chemotherapy with carboplatin for an AUC of 5 and Alimta at 500 mg per meter squared to be given every 3 weeks. She'll return in 3 weeks prior to cycle #2 with a repeat CBC differential and C. met. The patient is in agreement with proceeding with chemotherapy as scheduled.  Laural Benes,  Lydia Meng E, PA-C   All questions were answered. The patient knows to call the clinic with any problems, questions or concerns. We can certainly see the patient much sooner if necessary.  I spent 20 minutes counseling the patient face to face. The total time spent in the appointment was 30 minutes.

## 2012-04-28 NOTE — Telephone Encounter (Signed)
Called patient for chemo follow up, denies any nausea/vomiting/diarrhea. States she is having no problems. Questions if she can take her vitamins, reviewed with Dr Arbutus Ped, nothing contraindicated at this time with current meds listed. Informed patient to call if any problems, keep scheduled appts as planned.

## 2012-05-04 ENCOUNTER — Other Ambulatory Visit (HOSPITAL_BASED_OUTPATIENT_CLINIC_OR_DEPARTMENT_OTHER): Admitting: Lab

## 2012-05-04 DIAGNOSIS — C3430 Malignant neoplasm of lower lobe, unspecified bronchus or lung: Secondary | ICD-10-CM

## 2012-05-04 DIAGNOSIS — C3490 Malignant neoplasm of unspecified part of unspecified bronchus or lung: Secondary | ICD-10-CM

## 2012-05-04 LAB — CBC WITH DIFFERENTIAL/PLATELET
Basophils Absolute: 0 10*3/uL (ref 0.0–0.1)
EOS%: 2.3 % (ref 0.0–7.0)
MCH: 31.5 pg (ref 25.1–34.0)
MCV: 92.6 fL (ref 79.5–101.0)
MONO%: 6.4 % (ref 0.0–14.0)
RBC: 3.99 10*6/uL (ref 3.70–5.45)
RDW: 12.2 % (ref 11.2–14.5)

## 2012-05-04 LAB — COMPREHENSIVE METABOLIC PANEL (CC13)
AST: 21 U/L (ref 5–34)
Albumin: 3.7 g/dL (ref 3.5–5.0)
Alkaline Phosphatase: 231 U/L — ABNORMAL HIGH (ref 40–150)
BUN: 23 mg/dL (ref 7.0–26.0)
Potassium: 3.7 mEq/L (ref 3.5–5.1)
Sodium: 141 mEq/L (ref 136–145)

## 2012-05-11 ENCOUNTER — Other Ambulatory Visit (HOSPITAL_BASED_OUTPATIENT_CLINIC_OR_DEPARTMENT_OTHER): Admitting: Lab

## 2012-05-11 DIAGNOSIS — C3490 Malignant neoplasm of unspecified part of unspecified bronchus or lung: Secondary | ICD-10-CM

## 2012-05-11 DIAGNOSIS — C3430 Malignant neoplasm of lower lobe, unspecified bronchus or lung: Secondary | ICD-10-CM

## 2012-05-11 LAB — COMPREHENSIVE METABOLIC PANEL (CC13)
AST: 21 U/L (ref 5–34)
Alkaline Phosphatase: 222 U/L — ABNORMAL HIGH (ref 40–150)
BUN: 22 mg/dL (ref 7.0–26.0)
Creatinine: 1.1 mg/dL (ref 0.6–1.1)
Total Bilirubin: 0.23 mg/dL (ref 0.20–1.20)

## 2012-05-11 LAB — CBC WITH DIFFERENTIAL/PLATELET
Basophils Absolute: 0 10*3/uL (ref 0.0–0.1)
EOS%: 1.1 % (ref 0.0–7.0)
HCT: 33.9 % — ABNORMAL LOW (ref 34.8–46.6)
HGB: 11.7 g/dL (ref 11.6–15.9)
LYMPH%: 37.2 % (ref 14.0–49.7)
MCH: 31.2 pg (ref 25.1–34.0)
MCV: 90.5 fL (ref 79.5–101.0)
MONO%: 10.6 % (ref 0.0–14.0)
NEUT%: 51 % (ref 38.4–76.8)
Platelets: 208 10*3/uL (ref 145–400)
RDW: 12.3 % (ref 11.2–14.5)

## 2012-05-18 ENCOUNTER — Other Ambulatory Visit (HOSPITAL_BASED_OUTPATIENT_CLINIC_OR_DEPARTMENT_OTHER): Admitting: Lab

## 2012-05-18 ENCOUNTER — Other Ambulatory Visit: Admitting: Lab

## 2012-05-18 ENCOUNTER — Ambulatory Visit (HOSPITAL_BASED_OUTPATIENT_CLINIC_OR_DEPARTMENT_OTHER): Admitting: Physician Assistant

## 2012-05-18 ENCOUNTER — Ambulatory Visit (HOSPITAL_BASED_OUTPATIENT_CLINIC_OR_DEPARTMENT_OTHER)

## 2012-05-18 VITALS — BP 116/66 | HR 96 | Temp 96.0°F | Resp 20 | Ht 66.0 in | Wt 163.3 lb

## 2012-05-18 DIAGNOSIS — R110 Nausea: Secondary | ICD-10-CM

## 2012-05-18 DIAGNOSIS — Z5111 Encounter for antineoplastic chemotherapy: Secondary | ICD-10-CM

## 2012-05-18 DIAGNOSIS — C3430 Malignant neoplasm of lower lobe, unspecified bronchus or lung: Secondary | ICD-10-CM

## 2012-05-18 DIAGNOSIS — C3490 Malignant neoplasm of unspecified part of unspecified bronchus or lung: Secondary | ICD-10-CM

## 2012-05-18 DIAGNOSIS — C770 Secondary and unspecified malignant neoplasm of lymph nodes of head, face and neck: Secondary | ICD-10-CM

## 2012-05-18 LAB — CBC WITH DIFFERENTIAL/PLATELET
Basophils Absolute: 0 10*3/uL (ref 0.0–0.1)
EOS%: 0 % (ref 0.0–7.0)
Eosinophils Absolute: 0 10*3/uL (ref 0.0–0.5)
HCT: 34.2 % — ABNORMAL LOW (ref 34.8–46.6)
HGB: 11.7 g/dL (ref 11.6–15.9)
MCH: 30.8 pg (ref 25.1–34.0)
MCV: 90 fL (ref 79.5–101.0)
MONO%: 3.2 % (ref 0.0–14.0)
NEUT%: 86.7 % — ABNORMAL HIGH (ref 38.4–76.8)

## 2012-05-18 LAB — COMPREHENSIVE METABOLIC PANEL (CC13)
Albumin: 4.1 g/dL (ref 3.5–5.0)
Alkaline Phosphatase: 248 U/L — ABNORMAL HIGH (ref 40–150)
BUN: 16 mg/dL (ref 7.0–26.0)
CO2: 25 mEq/L (ref 22–29)
Glucose: 136 mg/dl — ABNORMAL HIGH (ref 70–99)
Potassium: 3.9 mEq/L (ref 3.5–5.1)
Total Bilirubin: 0.25 mg/dL (ref 0.20–1.20)

## 2012-05-18 MED ORDER — ONDANSETRON 16 MG/50ML IVPB (CHCC): 16.0000 mg | Freq: Once | INTRAVENOUS | Status: DC

## 2012-05-18 MED ORDER — SODIUM CHLORIDE 0.9 % IV SOLN
436.0000 mg | Freq: Once | INTRAVENOUS | Status: AC
  Administered 2012-05-18: 440 mg via INTRAVENOUS
  Filled 2012-05-18: qty 44

## 2012-05-18 MED ORDER — CARBOPLATIN CHEMO INTRADERMAL TEST DOSE 100MCG/0.02ML
100.0000 ug | Freq: Once | INTRADERMAL | Status: AC
  Administered 2012-05-18: 100 ug via INTRADERMAL
  Filled 2012-05-18: qty 0.01

## 2012-05-18 MED ORDER — DEXAMETHASONE SODIUM PHOSPHATE 4 MG/ML IJ SOLN
20.0000 mg | Freq: Once | INTRAMUSCULAR | Status: AC
  Administered 2012-05-18: 20 mg via INTRAVENOUS

## 2012-05-18 MED ORDER — SODIUM CHLORIDE 0.9 % IV SOLN: Freq: Once | INTRAVENOUS | Status: DC

## 2012-05-18 MED ORDER — SODIUM CHLORIDE 0.9 % IV SOLN
500.0000 mg/m2 | Freq: Once | INTRAVENOUS | Status: AC
  Administered 2012-05-18: 925 mg via INTRAVENOUS
  Filled 2012-05-18: qty 37

## 2012-05-18 MED ORDER — LORAZEPAM 0.5 MG PO TABS: ORAL_TABLET | ORAL | Status: DC

## 2012-05-18 NOTE — Patient Instructions (Addendum)
[facility] Discharge Instructions for Patients Receiving Chemotherapy  Today you received the following chemotherapy agents: Alimta, Carboplatin. To help prevent nausea and vomiting after your treatment, we encourage you to take your nausea medication.    If you develop nausea and vomiting that is not controlled by your nausea medication, call the clinic. If it is after clinic hours your family physician or the after hours number for the clinic or go to the Emergency Department.   BELOW ARE SYMPTOMS THAT SHOULD BE REPORTED IMMEDIATELY:  *FEVER GREATER THAN 100.5 F  *CHILLS WITH OR WITHOUT FEVER  NAUSEA AND VOMITING THAT IS NOT CONTROLLED WITH YOUR NAUSEA MEDICATION  *UNUSUAL SHORTNESS OF BREATH  *UNUSUAL BRUISING OR BLEEDING  TENDERNESS IN MOUTH AND THROAT WITH OR WITHOUT PRESENCE OF ULCERS  *URINARY PROBLEMS  *BOWEL PROBLEMS  UNUSUAL RASH Items with * indicate a potential emergency and should be followed up as soon as possible.

## 2012-05-18 NOTE — Patient Instructions (Addendum)
Continue with chemotherapy and weekly labs as scheduled Followup in 3 weeks prior to your next scheduled cycle of chemotherapy

## 2012-05-19 ENCOUNTER — Telehealth: Payer: Self-pay | Admitting: *Deleted

## 2012-05-19 ENCOUNTER — Telehealth: Payer: Self-pay | Admitting: Internal Medicine

## 2012-05-19 ENCOUNTER — Other Ambulatory Visit: Payer: Self-pay | Admitting: Physician Assistant

## 2012-05-19 NOTE — Telephone Encounter (Signed)
Per staff message and POF I have scheduled appts.  JMW  

## 2012-05-19 NOTE — Progress Notes (Signed)
[facility] [phone]   [phone]  OFFICE PROGRESS NOTE  [name], MD [address] [place] [address]  DIAGNOSIS: Recurrent non-small cell lung cancer initially diagnosed as stage IIA (T1 M1 MX) adenocarcinoma in April 2011.   PRIOR THERAPY:  1. Status post 3 cycles of neoadjuvant chemotherapy with carboplatin and Alimta. Last dose was given July 23, 2009. 2. Status post left lower lobectomy with lymph node dissection under the care of Dr. Edwyna Shell on October 17, 2009. 3. Status post concurrent chemoradiation with weekly carboplatin and paclitaxel for disease recurrence. Last dose was given May 07, 2010. 4. Status post 3 cycles of consolidation chemotherapy with carboplatin and Alimta. Last dose was given August 13, 2010. 5. Status post palliative radiotherapy to the right neck area. 6. Systemic chemotherapy with single agent Taxotere 75 mg/m2 every 3 weeks,, status post 6 cycles, last dose was given on 09/03/2011.  CURRENT THERAPY: Systemic chemotherapy with carboplatin for an AUC of 5 and Alimta at 500 mg per meter square given every 3 weeks, status post 1 cycle. INTERVAL HISTORY: Kaitlin Reed 63 y.o. female returns to the clinic today for a followup visit. Patient reports she had significant nausea and vomiting that was not resolved with her Compazine related to chemotherapy. She also had a MAXIMUM TEMPERATURE of 101 however she did not call our office to inform us of either the persistent nausea vomiting or the fever. She requests something different to be prescribed for nausea at all possible. She is also having some difficulty sleeping at night. She voiced no other specific complaints.  MEDICAL HISTORY: Past Medical History  Diagnosis Date  . Heart murmur   . Tubal pregnancy   . lung ca dx'd 06/2009    chemo comp 07/2010; xrt comp 06/2010  . Hypercholesteremia     h/o    ALLERGIES:  is allergic to codeine; motrin;  other; trazodone and nefazodone; banana; and tape.  MEDICATIONS:  Current Outpatient Prescriptions  Medication Sig Dispense Refill  . Ascorbic Acid (VITAMIN C) 1000 MG tablet Take 1,000 mg by mouth daily.      Marland Kitchen aspirin EC 81 MG tablet Take 81 mg by mouth daily.      . calcium carbonate (OS-CAL) 600 MG TABS Take 600 mg by mouth 2 (two) times daily with a meal.      . Cyanocobalamin (B-12 PO) Take 1 tablet by mouth daily.      Marland Kitchen dexamethasone (DECADRON) 4 MG tablet one tablet by mouth twice a day the day before, day of and day after the chemotherapy  40 tablet  1  . fluconazole (DIFLUCAN) 150 MG tablet Take 1 tablet (150 mg total) by mouth once.  1 tablet  1  . folic acid (FOLVITE) 1 MG tablet TAKE 1 TABLET EVERY DAY  30 tablet  3  . IRON COMBINATIONS PO Take 1 tablet by mouth daily.       Marland Kitchen LORazepam (ATIVAN) 0.5 MG tablet Take 1 tablet by mouth or sublingually every 8 hours as needed for nausea. May take one tablet at bedtime as needed for sleep  40 tablet  0  . metoprolol tartrate (LOPRESSOR) 25 MG tablet Take 25 mg by mouth 2 (two) times daily.      . Multiple Vitamin (MULITIVITAMIN WITH MINERALS) TABS Take 1 tablet by mouth daily.        . potassium chloride (MICRO-K) 10 MEQ CR capsule Take 5 mEq by  mouth daily.       . prochlorperazine (COMPAZINE) 10 MG tablet TAKE 1 TABLET BY MOUTH EVERY 6 HOURS AS NEEDED.  30 tablet  1  . VITAMIN D, CHOLECALCIFEROL, PO Take 1 tablet by mouth daily.       Marland Kitchen VITAMIN Reed PO Take 1 tablet by mouth daily.      . [DISCONTINUED] Alum & Mag Hydroxide-Simeth (MAGIC MOUTHWASH W/LIDOCAINE) SOLN Take 10 mLs by mouth 4 (four) times daily as needed.  500 mL  5    SURGICAL HISTORY:  Past Surgical History  Procedure Date  . Lung removal, partial 10/17/09    left. pt states they removed small piece of lung due to cancer  . 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 Resp: clear to auscultation bilaterally Cardio: regular rate and rhythm, S1, S2 normal, no murmur, click, rub or gallop GI: soft, non-tender; bowel sounds normal; no masses,  no organomegaly Extremities: extremities normal, atraumatic, no cyanosis or edema Neurologic: Alert and oriented X 3, normal strength and tone. Normal symmetric reflexes. Normal coordination and gait  ECOG PERFORMANCE STATUS: 0 - Asymptomatic  Blood pressure 116/66, pulse 96, temperature 96 F (35.6 C), temperature source Oral, resp. rate 20, height 5\' 6"  (1.676 m), weight 163 lb 4.8 oz (74.072 kg).  LABORATORY DATA: Lab Results  Component Value Date   WBC 5.9 05/18/2012   HGB 11.7 05/18/2012   HCT 34.2* 05/18/2012   MCV 90.0 05/18/2012   PLT 296 05/18/2012      Chemistry      Component Value Date/Time   NA 140 05/18/2012 1438   NA 141 01/21/2012 0557   NA 138 11/06/2011 1121   K 3.9 05/18/2012 1438   K 3.9 01/21/2012 0557   K 4.3 11/06/2011 1121   CL 102 05/18/2012 1438   CL 105 01/21/2012 0557   CL 98 11/06/2011 1121   CO2 25 05/18/2012 1438   CO2 26 01/21/2012 0557   CO2 30 11/06/2011 1121   BUN 16.0 05/18/2012 1438   BUN 13 01/21/2012 0557   BUN 15 11/06/2011 1121   CREATININE 1.1 05/18/2012 1438   CREATININE 0.87 01/21/2012 0557   CREATININE 0.9 11/06/2011 1121      Component Value Date/Time   CALCIUM 10.5* 05/18/2012 1438   CALCIUM 9.8 01/21/2012 0557   CALCIUM 9.0 11/06/2011 1121   ALKPHOS 248* 05/18/2012 1438   ALKPHOS 129* 01/20/2012 1926   ALKPHOS 110* 11/06/2011 1121   AST 22 05/18/2012 1438   AST 17 01/20/2012 1926   AST 36 11/06/2011 1121   ALT 14 05/18/2012 1438   ALT 6 01/20/2012 1926   BILITOT 0.25 05/18/2012 1438     BILITOT 0.2* 01/20/2012 1926   BILITOT 0.50 11/06/2011 1121       RADIOGRAPHIC STUDIES: Ct Chest W Contrast  04/01/2012  *RADIOLOGY REPORT*  Clinical Data: Follow-up lung cancer.  Chemotherapy and radiation therapy complete.  CT CHEST WITH CONTRAST  Technique:  Multidetector CT imaging of the chest was performed following the standard protocol during bolus administration of intravenous contrast.  Contrast: 80mL OMNIPAQUE IOHEXOL 300 MG/ML  SOLN  Comparison: 02/06/2012.  Findings: Mediastinal lymph nodes have  enlarged in the interval, now measuring up to 1.9 cm in the low right paratracheal station (previously 1.6 cm).  No hilar or axillary adenopathy.  Small pericardial effusion, stable.  Heart size normal.  Coronary artery calcification.  Emphysema. Nodular densities in the medial right upper lobe measure up to 6 x 8 mm (previously 5 x 5 mm). Postoperative changes of left lower lobectomy.  A tiny subpleural nodule in the left upper lobe measures 4 mm (previously 2 mm).  Radiation changes in the left perihilar region, with associated volume loss.  No pleural fluid. Airway is otherwise unremarkable.  Incidental imaging of the upper abdomen shows tiny low density lesions in the hepatic dome, likely stable.  No worrisome lytic or sclerotic lesions.  IMPRESSION:  1.  Interval progression of metastatic disease as evidenced by enlarging mediastinal lymph nodes and bilateral pulmonary nodules. 2.  Small pericardial effusion, stable.   Original Report Authenticated By: Leanna Battles, M.D.     ASSESSMENT/PLAN: This is a very pleasant 62 years old African American female with recurrent non-small cell lung cancer status post several chemotherapy regimen and recently she has some evidence for disease progression. The patient was discussed with Dr. Arbutus Ped. She's now status post one cycle of systemic chemotherapy with carboplatin for an AUC 5 and Alimta 500 mg per meter squared given every 3 weeks. She'll proceed  with cycle #2 of her chemotherapy as scheduled today. To address her nausea she'll be placed on Ativan 0.5 mg by mouth or sublingual he every 8 hours as needed for nausea she may also take 1 tablet at bedtime as needed for rest she is given a prescription for 40 tablets with no refill. She'll continue with weekly labs and return in 3 weeks prior to cycle #3 with a repeat CBC differential and C. met. Patient was instructed to call our office should she develop a temperature of 100.5 or greater or have more episodes of protracted nausea and vomiting. Patient voiced understanding.   Kaitlin Reed, Kaitlin Zuercher E, PA-C   All questions were answered. The patient knows to call the clinic with any problems, questions or concerns. We can certainly see the patient much sooner if necessary.  I spent 20 minutes counseling the patient face to face. The total time spent in the appointment was 30 minutes.

## 2012-05-19 NOTE — Telephone Encounter (Signed)
s/w pt and she is aware of her 1/27 appt and wil get a sch at that time        anne

## 2012-05-25 ENCOUNTER — Other Ambulatory Visit (HOSPITAL_BASED_OUTPATIENT_CLINIC_OR_DEPARTMENT_OTHER): Admitting: Lab

## 2012-05-25 DIAGNOSIS — C3490 Malignant neoplasm of unspecified part of unspecified bronchus or lung: Secondary | ICD-10-CM

## 2012-05-25 DIAGNOSIS — C3430 Malignant neoplasm of lower lobe, unspecified bronchus or lung: Secondary | ICD-10-CM

## 2012-05-25 LAB — CBC WITH DIFFERENTIAL/PLATELET
Basophils Absolute: 0 10*3/uL (ref 0.0–0.1)
Eosinophils Absolute: 0 10*3/uL (ref 0.0–0.5)
HGB: 11.8 g/dL (ref 11.6–15.9)
MCV: 91.1 fL (ref 79.5–101.0)
MONO#: 0.3 10*3/uL (ref 0.1–0.9)
MONO%: 10.5 % (ref 0.0–14.0)
NEUT#: 1.4 10*3/uL — ABNORMAL LOW (ref 1.5–6.5)
RBC: 3.7 10*6/uL (ref 3.70–5.45)
RDW: 12.6 % (ref 11.2–14.5)
WBC: 3.1 10*3/uL — ABNORMAL LOW (ref 3.9–10.3)

## 2012-05-25 LAB — COMPREHENSIVE METABOLIC PANEL (CC13)
Albumin: 3.5 g/dL (ref 3.5–5.0)
BUN: 24.2 mg/dL (ref 7.0–26.0)
CO2: 29 mEq/L (ref 22–29)
Calcium: 9.8 mg/dL (ref 8.4–10.4)
Chloride: 102 mEq/L (ref 98–107)
Glucose: 92 mg/dl (ref 70–99)
Potassium: 3.7 mEq/L (ref 3.5–5.1)
Sodium: 140 mEq/L (ref 136–145)
Total Protein: 7.4 g/dL (ref 6.4–8.3)

## 2012-06-01 ENCOUNTER — Other Ambulatory Visit

## 2012-06-01 ENCOUNTER — Telehealth: Payer: Self-pay | Admitting: Internal Medicine

## 2012-06-02 ENCOUNTER — Other Ambulatory Visit (HOSPITAL_BASED_OUTPATIENT_CLINIC_OR_DEPARTMENT_OTHER): Admitting: Lab

## 2012-06-02 DIAGNOSIS — C3490 Malignant neoplasm of unspecified part of unspecified bronchus or lung: Secondary | ICD-10-CM

## 2012-06-02 DIAGNOSIS — C3430 Malignant neoplasm of lower lobe, unspecified bronchus or lung: Secondary | ICD-10-CM

## 2012-06-02 LAB — CBC WITH DIFFERENTIAL/PLATELET
Basophils Absolute: 0 10*3/uL (ref 0.0–0.1)
Eosinophils Absolute: 0 10*3/uL (ref 0.0–0.5)
HGB: 11.3 g/dL — ABNORMAL LOW (ref 11.6–15.9)
MCV: 91.9 fL (ref 79.5–101.0)
MONO%: 13.6 % (ref 0.0–14.0)
NEUT#: 1.4 10*3/uL — ABNORMAL LOW (ref 1.5–6.5)
RBC: 3.58 10*6/uL — ABNORMAL LOW (ref 3.70–5.45)
RDW: 13 % (ref 11.2–14.5)
WBC: 3.2 10*3/uL — ABNORMAL LOW (ref 3.9–10.3)
lymph#: 1.4 10*3/uL (ref 0.9–3.3)

## 2012-06-02 LAB — COMPREHENSIVE METABOLIC PANEL (CC13)
AST: 59 U/L — ABNORMAL HIGH (ref 5–34)
Albumin: 3.4 g/dL — ABNORMAL LOW (ref 3.5–5.0)
Alkaline Phosphatase: 200 U/L — ABNORMAL HIGH (ref 40–150)
BUN: 17.9 mg/dL (ref 7.0–26.0)
Calcium: 9.2 mg/dL (ref 8.4–10.4)
Chloride: 104 mEq/L (ref 98–107)
Glucose: 101 mg/dl — ABNORMAL HIGH (ref 70–99)
Potassium: 3.5 mEq/L (ref 3.5–5.1)
Sodium: 142 mEq/L (ref 136–145)
Total Protein: 7.4 g/dL (ref 6.4–8.3)

## 2012-06-08 ENCOUNTER — Other Ambulatory Visit (HOSPITAL_BASED_OUTPATIENT_CLINIC_OR_DEPARTMENT_OTHER): Admitting: Lab

## 2012-06-08 ENCOUNTER — Encounter: Payer: Self-pay | Admitting: Internal Medicine

## 2012-06-08 ENCOUNTER — Telehealth: Payer: Self-pay | Admitting: Internal Medicine

## 2012-06-08 ENCOUNTER — Ambulatory Visit (HOSPITAL_BASED_OUTPATIENT_CLINIC_OR_DEPARTMENT_OTHER): Admitting: Physician Assistant

## 2012-06-08 ENCOUNTER — Ambulatory Visit (HOSPITAL_BASED_OUTPATIENT_CLINIC_OR_DEPARTMENT_OTHER)

## 2012-06-08 ENCOUNTER — Telehealth: Payer: Self-pay | Admitting: *Deleted

## 2012-06-08 VITALS — BP 128/63 | HR 102 | Temp 97.3°F | Resp 18 | Ht 66.0 in | Wt 164.0 lb

## 2012-06-08 VITALS — BP 129/57 | HR 87 | Temp 98.0°F | Resp 18

## 2012-06-08 DIAGNOSIS — C3490 Malignant neoplasm of unspecified part of unspecified bronchus or lung: Secondary | ICD-10-CM

## 2012-06-08 DIAGNOSIS — Z5111 Encounter for antineoplastic chemotherapy: Secondary | ICD-10-CM

## 2012-06-08 DIAGNOSIS — C770 Secondary and unspecified malignant neoplasm of lymph nodes of head, face and neck: Secondary | ICD-10-CM

## 2012-06-08 DIAGNOSIS — C3430 Malignant neoplasm of lower lobe, unspecified bronchus or lung: Secondary | ICD-10-CM

## 2012-06-08 LAB — COMPREHENSIVE METABOLIC PANEL (CC13)
ALT: 22 U/L (ref 0–55)
Alkaline Phosphatase: 205 U/L — ABNORMAL HIGH (ref 40–150)
CO2: 25 mEq/L (ref 22–29)
Creatinine: 1 mg/dL (ref 0.6–1.1)
Glucose: 139 mg/dl — ABNORMAL HIGH (ref 70–99)
Total Bilirubin: 0.26 mg/dL (ref 0.20–1.20)

## 2012-06-08 LAB — CBC WITH DIFFERENTIAL/PLATELET
EOS%: 0 % (ref 0.0–7.0)
MCH: 30.9 pg (ref 25.1–34.0)
MCV: 89.9 fL (ref 79.5–101.0)
MONO%: 2.5 % (ref 0.0–14.0)
NEUT#: 4.9 10*3/uL (ref 1.5–6.5)
RBC: 3.66 10*6/uL — ABNORMAL LOW (ref 3.70–5.45)
RDW: 13.7 % (ref 11.2–14.5)
lymph#: 0.5 10*3/uL — ABNORMAL LOW (ref 0.9–3.3)

## 2012-06-08 MED ORDER — SODIUM CHLORIDE 0.9 % IV SOLN
440.0000 mg | Freq: Once | INTRAVENOUS | Status: AC
  Administered 2012-06-08: 440 mg via INTRAVENOUS
  Filled 2012-06-08: qty 44

## 2012-06-08 MED ORDER — SODIUM CHLORIDE 0.9 % IV SOLN
Freq: Once | INTRAVENOUS | Status: AC
  Administered 2012-06-08: 15:00:00 via INTRAVENOUS

## 2012-06-08 MED ORDER — CYANOCOBALAMIN 1000 MCG/ML IJ SOLN
1000.0000 ug | Freq: Once | INTRAMUSCULAR | Status: AC
  Administered 2012-06-08: 1000 ug via INTRAMUSCULAR

## 2012-06-08 MED ORDER — CARBOPLATIN CHEMO INTRADERMAL TEST DOSE 100MCG/0.02ML
100.0000 ug | Freq: Once | INTRADERMAL | Status: AC
  Administered 2012-06-08: 100 ug via INTRADERMAL
  Filled 2012-06-08: qty 0.01

## 2012-06-08 MED ORDER — ONDANSETRON 16 MG/50ML IVPB (CHCC)
16.0000 mg | Freq: Once | INTRAVENOUS | Status: AC
  Administered 2012-06-08: 16 mg via INTRAVENOUS

## 2012-06-08 MED ORDER — SODIUM CHLORIDE 0.9 % IV SOLN
500.0000 mg/m2 | Freq: Once | INTRAVENOUS | Status: AC
  Administered 2012-06-08: 925 mg via INTRAVENOUS
  Filled 2012-06-08: qty 37

## 2012-06-08 MED ORDER — DEXAMETHASONE SODIUM PHOSPHATE 4 MG/ML IJ SOLN
20.0000 mg | Freq: Once | INTRAMUSCULAR | Status: AC
  Administered 2012-06-08: 20 mg via INTRAVENOUS

## 2012-06-08 NOTE — Progress Notes (Signed)
1535 Carbo skin test initiated to Right forearm. 1605 5, 15, 30 minute skin test unremarkable.

## 2012-06-08 NOTE — Patient Instructions (Addendum)
[facility] Discharge Instructions for Patients Receiving Chemotherapy  Today you received the following chemotherapy agents alimta/ carbo  To help prevent nausea and vomiting after your treatment, we encourage you to take your nausea medication and take it as often as prescribed.   If you develop nausea and vomiting that is not controlled by your nausea medication, call the clinic. If it is after clinic hours your family physician or the after hours number for the clinic or go to the Emergency Department.   BELOW ARE SYMPTOMS THAT SHOULD BE REPORTED IMMEDIATELY:  *FEVER GREATER THAN 100.5 F  *CHILLS WITH OR WITHOUT FEVER  NAUSEA AND VOMITING THAT IS NOT CONTROLLED WITH YOUR NAUSEA MEDICATION  *UNUSUAL SHORTNESS OF BREATH  *UNUSUAL BRUISING OR BLEEDING  TENDERNESS IN MOUTH AND THROAT WITH OR WITHOUT PRESENCE OF ULCERS  *URINARY PROBLEMS  *BOWEL PROBLEMS  UNUSUAL RASH Items with * indicate a potential emergency and should be followed up as soon as possible.  One of the nurses will contact you 24 hours after your treatment. Please let the nurse know about any problems that you may have experienced. Feel free to call the clinic you have any questions or concerns. The clinic phone number is [phone].   I have been informed and understand all the instructions given to me. I know to contact the clinic, my physician, or go to the Emergency Department if any problems should occur. I do not have any questions at this time, but understand that I may call the clinic during office hours   should I have any questions or need assistance in obtaining follow up care.    __________________________________________  _____________  __________ Signature of Patient or Authorized Representative            Date                   Time    __________________________________________ Nurse's Signature    

## 2012-06-08 NOTE — Patient Instructions (Addendum)
Continue with weekly labs as scheduled Follow up with Dr. Arbutus Ped in 3 weeks with a restaging CT scan of your chest, abdomen and pelvis to re-evaluate your disease

## 2012-06-08 NOTE — Telephone Encounter (Signed)
Per staff message and POF I have scheduled appts.  JMW  

## 2012-06-08 NOTE — Telephone Encounter (Signed)
gv and printed appt schedule for pt for Feb and March...gv pt barium...pt awar

## 2012-06-09 NOTE — Progress Notes (Signed)
[facility] Telephone:(336) [phone]   Fax:(336) [phone]  OFFICE PROGRESS NOTE  Kaitlin Rodriguez, MD [address] [place] [address]  DIAGNOSIS: Recurrent non-small cell lung cancer initially diagnosed as stage IIA (T1 M1 MX) adenocarcinoma in April 2011.   PRIOR THERAPY:  1. Status post 3 cycles of neoadjuvant chemotherapy with carboplatin and Alimta. Last dose was given July 23, 2009. 2. Status post left lower lobectomy with lymph node dissection under the care of Dr. Edwyna Shell on October 17, 2009. 3. Status post concurrent chemoradiation with weekly carboplatin and paclitaxel for disease recurrence. Last dose was given May 07, 2010. 4. Status post 3 cycles of consolidation chemotherapy with carboplatin and Alimta. Last dose was given August 13, 2010. 5. Status post palliative radiotherapy to the right neck area. 6. Systemic chemotherapy with single agent Taxotere 75 mg/m2 every 3 weeks,, status post 6 cycles, last dose was given on 09/03/2011.  CURRENT THERAPY: Systemic chemotherapy with carboplatin for an AUC of 5 and Alimta at 500 mg per meter square given every 3 weeks, status post 2 cycles. INTERVAL HISTORY: Kaitlin Reed 62 y.o. female returns to the clinic today for a followup visit. She reports that she did not  use the lorazepam for nausea as what nausea that she had was well-controlled with her Compazine. Today she voices no specific complaints. She presents to proceed with cycle #3 of her systemic chemotherapy with carboplatin and Alimta.  MEDICAL HISTORY: Past Medical History  Diagnosis Date  . Heart murmur   . Tubal pregnancy   . lung ca dx'd 06/2009    chemo comp 07/2010; xrt comp 06/2010  . Hypercholesteremia     h/o    ALLERGIES:  is allergic to codeine; motrin; other; trazodone and nefazodone; banana; and tape.  MEDICATIONS:  Current Outpatient Prescriptions  Medication Sig Dispense Refill  . Ascorbic Acid (VITAMIN C) 1000 MG tablet  Take 1,000 mg by mouth daily.      Marland Kitchen aspirin EC 81 MG tablet Take 81 mg by mouth daily.      . calcium carbonate (OS-CAL) 600 MG TABS Take 600 mg by mouth 2 (two) times daily with a meal.      . Cyanocobalamin (B-12 PO) Take 1 tablet by mouth daily.      Marland Kitchen dexamethasone (DECADRON) 4 MG tablet one tablet by mouth twice a day the day before, day of and day after the chemotherapy  40 tablet  1  . fluconazole (DIFLUCAN) 150 MG tablet Take 1 tablet (150 mg total) by mouth once.  1 tablet  1  . folic acid (FOLVITE) 1 MG tablet TAKE 1 TABLET EVERY DAY  30 tablet  3  . IRON COMBINATIONS PO Take 1 tablet by mouth daily.       Marland Kitchen LORazepam (ATIVAN) 0.5 MG tablet Take 1 tablet by mouth or sublingually every 8 hours as needed for nausea. May take one tablet at bedtime as needed for sleep  40 tablet  0  . metoprolol tartrate (LOPRESSOR) 25 MG tablet Take 25 mg by mouth 2 (two) times daily.      . Multiple Vitamin (MULITIVITAMIN WITH MINERALS) TABS Take 1 tablet by mouth daily.        . potassium chloride (MICRO-K) 10 MEQ CR capsule Take 5 mEq by mouth daily.       . prochlorperazine (COMPAZINE) 10 MG tablet TAKE 1 TABLET BY MOUTH EVERY 6 HOURS AS NEEDED.  30 tablet  1  . VITAMIN D, CHOLECALCIFEROL, PO Take 1 tablet by mouth daily.       Marland Kitchen VITAMIN E PO Take 1 tablet by mouth daily.      . [DISCONTINUED] Alum & Mag Hydroxide-Simeth (MAGIC MOUTHWASH W/LIDOCAINE) SOLN Take 10 mLs by mouth 4 (four) times daily as needed.  500 mL  5   No current facility-administered medications for this visit.    SURGICAL HISTORY:  Past Surgical History  Procedure Laterality Date  . Lung removal, partial  10/17/09    left. pt states they removed small piece of lung due to cancer  . 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; for milk tumor"    REVIEW OF SYSTEMS:  A comprehensive review of systems was negative except for: Constitutional: positive for fevers Gastrointestinal: positive for nausea and vomiting   PHYSICAL  EXAMINATION: General appearance: alert, cooperative and no distress Head: Normocephalic, without obvious abnormality, atraumatic Lymph nodes: Cervical, supraclavicular, and axillary nodes normal. Resp: clear to auscultation bilaterally Cardio: regular rate and rhythm, S1, S2 normal, no murmur, click, rub or gallop GI: soft, non-tender; bowel sounds normal; no masses,  no organomegaly Extremities: extremities normal, atraumatic, no cyanosis or edema Neurologic: Alert and oriented X 3, normal strength and tone. Normal symmetric reflexes. Normal coordination and gait  ECOG PERFORMANCE STATUS: 0 - Asymptomatic  Blood pressure 128/63, pulse 102, temperature 97.3 F (36.3 C), temperature source Oral, resp. rate 18, height 5\' 6"  (1.676 m), weight 164 lb (74.39 kg).  LABORATORY DATA: Lab Results  Component Value Date   WBC 5.5 06/08/2012   HGB 11.3* 06/08/2012   HCT 32.9* 06/08/2012   MCV 89.9 06/08/2012   PLT 309 06/08/2012      Chemistry      Component Value Date/Time   NA 139 06/08/2012 1354   NA 141 01/21/2012 0557   NA 138 11/06/2011 1121   K 3.6 06/08/2012 1354   K 3.9 01/21/2012 0557   K 4.3 11/06/2011 1121   CL 103 06/08/2012 1354   CL 105 01/21/2012 0557   CL 98 11/06/2011 1121   CO2 25 06/08/2012 1354   CO2 26 01/21/2012 0557   CO2 30 11/06/2011 1121   BUN 23.6 06/08/2012 1354   BUN 13 01/21/2012 0557   BUN 15 11/06/2011 1121   CREATININE 1.0 06/08/2012 1354   CREATININE 0.87 01/21/2012 0557   CREATININE 0.9 11/06/2011 1121      Component Value Date/Time   CALCIUM 10.3 06/08/2012 1354   CALCIUM 9.8 01/21/2012 0557   CALCIUM 9.0 11/06/2011 1121   ALKPHOS 205* 06/08/2012 1354   ALKPHOS 129* 01/20/2012 1926   ALKPHOS 110* 11/06/2011 1121   AST 24 06/08/2012 1354   AST 17 01/20/2012 1926   AST 36 11/06/2011 1121   ALT 22 06/08/2012 1354   ALT 6 01/20/2012 1926   BILITOT 0.26 06/08/2012 1354   BILITOT 0.2* 01/20/2012 1926   BILITOT 0.50 11/06/2011 1121       RADIOGRAPHIC STUDIES: Ct Chest W  Contrast  04/01/2012  *RADIOLOGY REPORT*  Clinical Data: Follow-up lung cancer.  Chemotherapy and radiation therapy complete.  CT CHEST WITH CONTRAST  Technique:  Multidetector CT imaging of the chest was performed following the standard protocol during bolus administration of intravenous contrast.  Contrast: 80mL OMNIPAQUE IOHEXOL 300 MG/ML  SOLN  Comparison: 02/06/2012.  Findings: Mediastinal lymph nodes have enlarged in the interval, now measuring up to 1.9 cm in the low right paratracheal station (previously 1.6 cm).  No hilar or axillary adenopathy.  Small pericardial effusion, stable.  Heart size normal.  Coronary artery calcification.  Emphysema. Nodular densities in the medial right upper lobe measure up to 6 x 8 mm (previously 5 x 5 mm). Postoperative changes of left lower lobectomy.  A tiny subpleural nodule in the left upper lobe measures 4 mm (previously 2 mm).  Radiation changes in the left perihilar region, with associated volume loss.  No pleural fluid. Airway is otherwise unremarkable.  Incidental imaging of the upper abdomen shows tiny low density lesions in the hepatic dome, likely stable.  No worrisome lytic or sclerotic lesions.  IMPRESSION:  1.  Interval progression of metastatic disease as evidenced by enlarging mediastinal lymph nodes and bilateral pulmonary nodules. 2.  Small pericardial effusion, stable.   Original Report Authenticated By: Leanna Battles, M.D.     ASSESSMENT/PLAN: This is a very pleasant 62 years old African American female with recurrent non-small cell lung cancer status post several chemotherapy regimen and recently she has some evidence for disease progression. The patient was discussed with Dr. Arbutus Ped. She's now status post 2 cycles of systemic chemotherapy with carboplatin for an AUC 5 and Alimta 500 mg per meter squared given every 3 weeks. She'll proceed with cycle #3 of her chemotherapy as scheduled today. She'll continue with weekly labs consisting of a CBC  differential and C. met. She'll followup with Dr. Arbutus Ped in 3 weeks to repeat CBC differential, C. met and CT of the chest abdomen and pelvis with contrast to reevaluate her disease   Conni Slipper, PA-C   All questions were answered. The patient knows to call the clinic with any problems, questions or concerns. We can certainly see the patient much sooner if necessary.  I spent 20 minutes counseling the patient face to face. The total time spent in the appointment was 30 minutes.

## 2012-06-15 ENCOUNTER — Other Ambulatory Visit (HOSPITAL_BASED_OUTPATIENT_CLINIC_OR_DEPARTMENT_OTHER): Admitting: Lab

## 2012-06-15 DIAGNOSIS — C3490 Malignant neoplasm of unspecified part of unspecified bronchus or lung: Secondary | ICD-10-CM

## 2012-06-15 DIAGNOSIS — C3430 Malignant neoplasm of lower lobe, unspecified bronchus or lung: Secondary | ICD-10-CM

## 2012-06-15 LAB — CBC WITH DIFFERENTIAL/PLATELET
BASO%: 0.5 % (ref 0.0–2.0)
Basophils Absolute: 0 10*3/uL (ref 0.0–0.1)
EOS%: 0.6 % (ref 0.0–7.0)
Eosinophils Absolute: 0 10*3/uL (ref 0.0–0.5)
HCT: 31.3 % — ABNORMAL LOW (ref 34.8–46.6)
HGB: 10.9 g/dL — ABNORMAL LOW (ref 11.6–15.9)
LYMPH%: 47.3 % (ref 14.0–49.7)
MCH: 31.9 pg (ref 25.1–34.0)
MCHC: 34.8 g/dL (ref 31.5–36.0)
MCV: 91.5 fL (ref 79.5–101.0)
MONO#: 0.2 10*3/uL (ref 0.1–0.9)
MONO%: 9.2 % (ref 0.0–14.0)
NEUT#: 1 10*3/uL — ABNORMAL LOW (ref 1.5–6.5)
NEUT%: 42.4 % (ref 38.4–76.8)
Platelets: 229 10*3/uL (ref 145–400)
RBC: 3.42 10*6/uL — ABNORMAL LOW (ref 3.70–5.45)
RDW: 13.9 % (ref 11.2–14.5)
WBC: 2.4 10*3/uL — ABNORMAL LOW (ref 3.9–10.3)
lymph#: 1.1 10*3/uL (ref 0.9–3.3)

## 2012-06-15 LAB — COMPREHENSIVE METABOLIC PANEL (CC13)
ALT: 14 U/L (ref 0–55)
AST: 23 U/L (ref 5–34)
Alkaline Phosphatase: 176 U/L — ABNORMAL HIGH (ref 40–150)
CO2: 29 mEq/L (ref 22–29)
Sodium: 139 mEq/L (ref 136–145)
Total Bilirubin: 0.29 mg/dL (ref 0.20–1.20)
Total Protein: 7.4 g/dL (ref 6.4–8.3)

## 2012-06-22 ENCOUNTER — Other Ambulatory Visit: Admitting: Lab

## 2012-06-24 ENCOUNTER — Telehealth: Payer: Self-pay | Admitting: Internal Medicine

## 2012-06-25 ENCOUNTER — Other Ambulatory Visit

## 2012-06-26 ENCOUNTER — Encounter (HOSPITAL_COMMUNITY): Payer: Self-pay

## 2012-06-26 ENCOUNTER — Ambulatory Visit (HOSPITAL_COMMUNITY)
Admission: RE | Admit: 2012-06-26 | Discharge: 2012-06-26 | Disposition: A | Discharge status: Home / Self Care | Source: Ambulatory Visit | Attending: Physician Assistant | Admitting: Physician Assistant

## 2012-06-26 DIAGNOSIS — C7951 Secondary malignant neoplasm of bone: Secondary | ICD-10-CM | POA: Insufficient documentation

## 2012-06-26 DIAGNOSIS — R599 Enlarged lymph nodes, unspecified: Secondary | ICD-10-CM | POA: Insufficient documentation

## 2012-06-26 DIAGNOSIS — R911 Solitary pulmonary nodule: Secondary | ICD-10-CM | POA: Insufficient documentation

## 2012-06-26 DIAGNOSIS — C3490 Malignant neoplasm of unspecified part of unspecified bronchus or lung: Secondary | ICD-10-CM | POA: Insufficient documentation

## 2012-06-26 DIAGNOSIS — K5730 Diverticulosis of large intestine without perforation or abscess without bleeding: Secondary | ICD-10-CM | POA: Insufficient documentation

## 2012-06-26 DIAGNOSIS — D259 Leiomyoma of uterus, unspecified: Secondary | ICD-10-CM | POA: Insufficient documentation

## 2012-06-26 MED ORDER — IOHEXOL 300 MG/ML  SOLN
100.0000 mL | Freq: Once | INTRAMUSCULAR | Status: AC | PRN
  Administered 2012-06-26: 100 mL via INTRAVENOUS

## 2012-06-29 ENCOUNTER — Ambulatory Visit (HOSPITAL_BASED_OUTPATIENT_CLINIC_OR_DEPARTMENT_OTHER): Admitting: Internal Medicine

## 2012-06-29 ENCOUNTER — Encounter: Payer: Self-pay | Admitting: Internal Medicine

## 2012-06-29 ENCOUNTER — Telehealth: Payer: Self-pay | Admitting: Internal Medicine

## 2012-06-29 ENCOUNTER — Other Ambulatory Visit (HOSPITAL_BASED_OUTPATIENT_CLINIC_OR_DEPARTMENT_OTHER): Admitting: Lab

## 2012-06-29 ENCOUNTER — Other Ambulatory Visit: Payer: Self-pay | Admitting: Internal Medicine

## 2012-06-29 ENCOUNTER — Ambulatory Visit (HOSPITAL_BASED_OUTPATIENT_CLINIC_OR_DEPARTMENT_OTHER)

## 2012-06-29 VITALS — BP 138/63 | HR 107 | Temp 97.6°F | Resp 20 | Ht 66.0 in | Wt 163.0 lb

## 2012-06-29 DIAGNOSIS — C3430 Malignant neoplasm of lower lobe, unspecified bronchus or lung: Secondary | ICD-10-CM

## 2012-06-29 DIAGNOSIS — C3490 Malignant neoplasm of unspecified part of unspecified bronchus or lung: Secondary | ICD-10-CM

## 2012-06-29 DIAGNOSIS — C770 Secondary and unspecified malignant neoplasm of lymph nodes of head, face and neck: Secondary | ICD-10-CM

## 2012-06-29 DIAGNOSIS — C7951 Secondary malignant neoplasm of bone: Secondary | ICD-10-CM

## 2012-06-29 LAB — COMPREHENSIVE METABOLIC PANEL
AST: 33 U/L (ref 0–37)
Alkaline Phosphatase: 166 U/L — ABNORMAL HIGH (ref 39–117)
BUN: 18 mg/dL (ref 6–23)
Creatinine, Ser: 0.8 mg/dL (ref 0.50–1.10)
Total Bilirubin: 0.2 mg/dL — ABNORMAL LOW (ref 0.3–1.2)

## 2012-06-29 LAB — CBC WITH DIFFERENTIAL/PLATELET
BASO%: 0 % (ref 0.0–2.0)
EOS%: 0 % (ref 0.0–7.0)
HCT: 32.7 % — ABNORMAL LOW (ref 34.8–46.6)
LYMPH%: 11.8 % — ABNORMAL LOW (ref 14.0–49.7)
MCH: 31.5 pg (ref 25.1–34.0)
MCHC: 33.9 g/dL (ref 31.5–36.0)
NEUT%: 85.7 % — ABNORMAL HIGH (ref 38.4–76.8)
Platelets: 276 10*3/uL (ref 145–400)
RBC: 3.52 10*6/uL — ABNORMAL LOW (ref 3.70–5.45)

## 2012-06-29 MED ORDER — ONDANSETRON 16 MG/50ML IVPB (CHCC)
16.0000 mg | Freq: Once | INTRAVENOUS | Status: AC
  Administered 2012-06-29: 16 mg via INTRAVENOUS

## 2012-06-29 MED ORDER — SODIUM CHLORIDE 0.9 % IV SOLN
Freq: Once | INTRAVENOUS | Status: AC
  Administered 2012-06-29: 16:00:00 via INTRAVENOUS

## 2012-06-29 MED ORDER — CARBOPLATIN CHEMO INTRADERMAL TEST DOSE 100MCG/0.02ML
100.0000 ug | Freq: Once | INTRADERMAL | Status: AC
  Administered 2012-06-29: 100 ug via INTRADERMAL
  Filled 2012-06-29: qty 0.02

## 2012-06-29 MED ORDER — SODIUM CHLORIDE 0.9 % IV SOLN
500.0000 mg/m2 | Freq: Once | INTRAVENOUS | Status: AC
  Administered 2012-06-29: 925 mg via INTRAVENOUS
  Filled 2012-06-29: qty 37

## 2012-06-29 MED ORDER — SODIUM CHLORIDE 0.9 % IV SOLN
410.0000 mg | Freq: Once | INTRAVENOUS | Status: AC
  Administered 2012-06-29: 410 mg via INTRAVENOUS
  Filled 2012-06-29: qty 41

## 2012-06-29 MED ORDER — DEXAMETHASONE SODIUM PHOSPHATE 4 MG/ML IJ SOLN
20.0000 mg | Freq: Once | INTRAMUSCULAR | Status: AC
  Administered 2012-06-29: 20 mg via INTRAVENOUS

## 2012-06-29 NOTE — Progress Notes (Signed)
[facility] [phone]   [phone]  OFFICE PROGRESS NOTE  [name], MD [address] [place] [address]  DIAGNOSIS: Recurrent non-small cell lung cancer initially diagnosed as stage IIA (T1 M1 MX) adenocarcinoma in April 2011.   PRIOR THERAPY:  1. Status post 3 cycles of neoadjuvant chemotherapy with carboplatin and Alimta. Last dose was given July 23, 2009. 2. Status post left lower lobectomy with lymph node dissection under the care of Dr. Edwyna Shell on October 17, 2009. 3. Status post concurrent chemoradiation with weekly carboplatin and paclitaxel for disease recurrence. Last dose was given May 07, 2010. 4. Status post 3 cycles of consolidation chemotherapy with carboplatin and Alimta. Last dose was given August 13, 2010. 5. Status post palliative radiotherapy to the right neck area. 6. Systemic chemotherapy with single agent Taxotere 75 mg/m2 every 3 weeks,, status post 6 cycles, last dose was given on 09/03/2011.  CURRENT THERAPY: Systemic chemotherapy with carboplatin for an AUC of 5 and Alimta at 500 mg per meter square given every 3 weeks, status post 3  cycles.  INTERVAL HISTORY: Kaitlin Reed 62 y.o. female returns to the clinic today for followup visit. The patient tolerated the last cycle of her chemotherapy with carboplatin and Alimta fairly well. She denied having any significant nausea or vomiting. She has no chest pain, shortness breath, cough or hemoptysis. She has no peripheral neuropathy. The patient had repeat CT scan of the chest, abdomen and pelvis performed recently and she is here for evaluation and discussion of her scan results.  MEDICAL HISTORY: Past Medical History  Diagnosis Date  . Heart murmur   . Tubal pregnancy   . lung ca dx'd 06/2009    chemo comp 07/2010; xrt comp 06/2010  . Hypercholesteremia     h/o    ALLERGIES:  is allergic to codeine; motrin; other; trazodone and nefazodone; banana; and  tape.  MEDICATIONS:  Current Outpatient Prescriptions  Medication Sig Dispense Refill  . Ascorbic Acid (VITAMIN C) 1000 MG tablet Take 1,000 mg by mouth daily.      Marland Kitchen aspirin EC 81 MG tablet Take 81 mg by mouth daily.      . calcium carbonate (OS-CAL) 600 MG TABS Take 600 mg by mouth 2 (two) times daily with a meal.      . Cyanocobalamin (B-12 PO) Take 1 tablet by mouth daily.      Marland Kitchen dexamethasone (DECADRON) 4 MG tablet one tablet by mouth twice a day the day before, day of and day after the chemotherapy  40 tablet  1  . folic acid (FOLVITE) 1 MG tablet TAKE 1 TABLET EVERY DAY  30 tablet  3  . IRON COMBINATIONS PO Take 1 tablet by mouth daily.       Marland Kitchen LORazepam (ATIVAN) 0.5 MG tablet Take 1 tablet by mouth or sublingually every 8 hours as needed for nausea. May take one tablet at bedtime as needed for sleep  40 tablet  0  . metoprolol tartrate (LOPRESSOR) 25 MG tablet Take 25 mg by mouth 2 (two) times daily.      . Multiple Vitamin (MULITIVITAMIN WITH MINERALS) TABS Take 1 tablet by mouth daily.        . potassium chloride (MICRO-K) 10 MEQ CR capsule Take 5 mEq by mouth daily.       Marland Kitchen VITAMIN D, CHOLECALCIFEROL, PO Take 1 tablet by mouth daily.       Marland Kitchen VITAMIN E  PO Take 1 tablet by mouth daily.      . prochlorperazine (COMPAZINE) 10 MG tablet TAKE 1 TABLET BY MOUTH EVERY 6 HOURS AS NEEDED.  30 tablet  1  . [DISCONTINUED] Alum & Mag Hydroxide-Simeth (MAGIC MOUTHWASH W/LIDOCAINE) SOLN Take 10 mLs by mouth 4 (four) times daily as needed.  500 mL  5   No current facility-administered medications for this visit.    SURGICAL HISTORY:  Past Surgical History  Procedure Laterality Date  . Lung removal, partial  10/17/09    left. pt states they removed small piece of lung due to cancer  . 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   PHYSICAL EXAMINATION: General appearance: alert, cooperative and no  distress Head: Normocephalic, without obvious abnormality, atraumatic Neck: no adenopathy Lymph nodes: Cervical, supraclavicular, and axillary nodes normal. Resp: clear to auscultation bilaterally Cardio: regular rate and rhythm, S1, S2 normal, no murmur, click, rub or gallop GI: soft, non-tender; bowel sounds normal; no masses,  no organomegaly Extremities: extremities normal, atraumatic, no cyanosis or edema Neurologic: Alert and oriented X 3, normal strength and tone. Normal symmetric reflexes. Normal coordination and gait  ECOG PERFORMANCE STATUS: 1 - Symptomatic but completely ambulatory  Blood pressure 138/63, pulse 107, temperature 97.6 F (36.4 C), temperature source Oral, resp. rate 20, height 5\' 6"  (1.676 m), weight 163 lb (73.936 kg).  LABORATORY DATA: Lab Results  Component Value Date   WBC 4.0 06/29/2012   HGB 11.1* 06/29/2012   HCT 32.7* 06/29/2012   MCV 92.9 06/29/2012   PLT 276 06/29/2012      Chemistry      Component Value Date/Time   NA 139 06/15/2012 1555   NA 141 01/21/2012 0557   NA 138 11/06/2011 1121   K 3.8 06/15/2012 1555   K 3.9 01/21/2012 0557   K 4.3 11/06/2011 1121   CL 102 06/15/2012 1555   CL 105 01/21/2012 0557   CL 98 11/06/2011 1121   CO2 29 06/15/2012 1555   CO2 26 01/21/2012 0557   CO2 30 11/06/2011 1121   BUN 26.7* 06/15/2012 1555   BUN 13 01/21/2012 0557   BUN 15 11/06/2011 1121   CREATININE 1.2* 06/15/2012 1555   CREATININE 0.87 01/21/2012 0557   CREATININE 0.9 11/06/2011 1121      Component Value Date/Time   CALCIUM 9.6 06/15/2012 1555   CALCIUM 9.8 01/21/2012 0557   CALCIUM 9.0 11/06/2011 1121   ALKPHOS 176* 06/15/2012 1555   ALKPHOS 129* 01/20/2012 1926   ALKPHOS 110* 11/06/2011 1121   AST 23 06/15/2012 1555   AST 17 01/20/2012 1926   AST 36 11/06/2011 1121   ALT 14 06/15/2012 1555   ALT 6 01/20/2012 1926   BILITOT 0.29 06/15/2012 1555   BILITOT 0.2* 01/20/2012 1926   BILITOT 0.50 11/06/2011 1121       RADIOGRAPHIC STUDIES: Ct Chest W  Contrast  06/26/2012  *RADIOLOGY REPORT*  Clinical Data:  Follow-up recurrent non-small cell lung carcinoma. Completed radiation therapy and chemotherapy.  CT CHEST, ABDOMEN AND PELVIS WITH CONTRAST  Technique:  Multidetector CT imaging of the chest, abdomen and pelvis was performed following the standard protocol during bolus administration of intravenous contrast.  Contrast: OMNIPAQUE IOHEXOL 300 MG/ML  SOLN  Comparison:  Chest CT on 04/01/2012 and abdomen pelvis CT on 02/06/2012   CT CHEST  Findings:  Postop changes from partial left  lung resection remains stable.  Paramediastinal radiation changes in the right lung are also stable.  Pulmonary emphysema again noted.  Previously seen small bilateral pulmonary nodules in the medial right upper lobe and subpleural left upper lobe are no longer visualized.  No new or enlarging pulmonary nodules masses are identified.  No evidence of acute infiltrate or central endobronchial lesion. No evidence of pleural effusion.  Small pericardial effusion or thickening remains stable.  Right paratracheal mediastinal lymphadenopathy is significantly decreased since previous study, currently measuring 1.2 cm in short axis on image 19 compared 1.9 cm previously.  Prevascular mediastinal lymph node measures 13 mm in short axis on image 15 and is stable. No new or increased adenopathy is seen in the mediastinum, hilar regions, or elsewhere within the thorax.  Increased and new small sclerotic bone lesions are seen within the lower thoracic spine and right scapula which are consistent with sclerotic bone metastases.  IMPRESSION:  1.  Decreased mediastinal lymphadenopathy and small bilateral pulmonary nodules. 2.  Increased sclerotic bone metastases.  This may be due to interval healing of treated bone metastases rather than progression of metastatic disease; correlation with treatment history is suggested.   CT ABDOMEN AND PELVIS  Findings:  The liver, gallbladder, adrenal  glands, pancreas, kidneys, and spleen are normal in appearance.  No evidence of hydronephrosis.  No soft tissue masses or lymphadenopathy identified within the abdomen or pelvis. Tiny calcified uterine fibroids are stable.  The adnexal regions are unremarkable.  No evidence of inflammatory process or abnormal fluid collections. Colonic diverticulosis is again demonstrated, without evidence of diverticulitis.  Increased in number and size of sclerotic bone lesions is seen throughout the left hemi pelvis, sacrum and lumbar spine, consistent with bone metastases.  IMPRESSION:  1.  Increased conspicuity of the sclerotic bone metastases in the pelvis in the lumbar spine.  This could be due to interval healing of treated bone metastases rather than progression of metastatic disease; correlation with treatment history is suggested. 2.  No evidence of soft tissue metastatic disease1 within the abdomen or pelvis. 3.  Diverticulosis.  No radiographic evidence of diverticulitis. 4.  Stable tiny calcified uterine fibroids.   Original Report Authenticated By: Myles Rosenthal, M.D.    ASSESSMENT: This is a very pleasant 62 years old African American female with metastatic non-small cell lung cancer, adenocarcinoma status post 3 cycles of systemic chemotherapy with carboplatin and Alimta with partial response in the mediastinal lymphadenopathy and smaller bilateral nodules but continues to sclerotic bone metastasis most likely secondary to interval healing of the treated bone lesions.    PLAN: I discussed the scan results with the patient today. I recommended for her to continue treatment was carboplatin and Alimta as scheduled. She will receive cycle #4 today. Regarding the bone metastasis I advised the patient to see her dentist for dental clearance before starting her on treatment with Xgeva. She was advised to continue her treatment with calcium and vitamin D. The patient would come back for followup visit in 3 weeks with  the next cycle of her chemotherapy. She was advised to call immediately if she has any concerning symptoms in the interval.  All questions were answered. The patient knows to call the clinic with any problems, questions or concerns. We can certainly see the patient much sooner if necessary.  I spent 15 minutes counseling the patient face to face. The total time spent in the appointment was 25 minutes.

## 2012-06-29 NOTE — Patient Instructions (Signed)
Your scan showed a partial response to your disease. Continue systemic chemotherapy with carboplatin and Alimta. Followup in 3 weeks.

## 2012-06-29 NOTE — Telephone Encounter (Signed)
Gave pt appt for lab, ML and chemo for March and APril 2014

## 2012-06-29 NOTE — Patient Instructions (Addendum)
[facility] Discharge Instructions for Patients Receiving Chemotherapy  Today you received the following chemotherapy agents :  Alimta,  Carboplatin.  To help prevent nausea and vomiting after your treatment, we encourage you to take your nausea medication as instructed by your physician.    If you develop nausea and vomiting that is not controlled by your nausea medication, call the clinic. If it is after clinic hours your family physician or the after hours number for the clinic or go to the Emergency Department.   BELOW ARE SYMPTOMS THAT SHOULD BE REPORTED IMMEDIATELY:  *FEVER GREATER THAN 100.5 F  *CHILLS WITH OR WITHOUT FEVER  NAUSEA AND VOMITING THAT IS NOT CONTROLLED WITH YOUR NAUSEA MEDICATION  *UNUSUAL SHORTNESS OF BREATH  *UNUSUAL BRUISING OR BLEEDING  TENDERNESS IN MOUTH AND THROAT WITH OR WITHOUT PRESENCE OF ULCERS  *URINARY PROBLEMS  *BOWEL PROBLEMS  UNUSUAL RASH Items with * indicate a potential emergency and should be followed up as soon as possible.  One of the nurses will contact you 24 hours after your treatment. Please let the nurse know about any problems that you may have experienced. Feel free to call the clinic you have any questions or concerns. The clinic phone number is [phone].   I have been informed and understand all the instructions given to me. I know to contact the clinic, my physician, or go to the Emergency Department if any problems should occur. I do not have any questions at this time, but understand that I may call the clinic during office hours   should I have any questions or need assistance in obtaining follow up care.    __________________________________________  _____________  __________ Signature of Patient or Authorized Representative            Date                   Time    __________________________________________ Nurse's Signature

## 2012-07-06 ENCOUNTER — Other Ambulatory Visit (HOSPITAL_BASED_OUTPATIENT_CLINIC_OR_DEPARTMENT_OTHER)

## 2012-07-06 DIAGNOSIS — C3490 Malignant neoplasm of unspecified part of unspecified bronchus or lung: Secondary | ICD-10-CM

## 2012-07-06 LAB — CBC WITH DIFFERENTIAL/PLATELET
Basophils Absolute: 0 10*3/uL (ref 0.0–0.1)
EOS%: 0.8 % (ref 0.0–7.0)
HCT: 29.3 % — ABNORMAL LOW (ref 34.8–46.6)
HGB: 10.1 g/dL — ABNORMAL LOW (ref 11.6–15.9)
LYMPH%: 51.4 % — ABNORMAL HIGH (ref 14.0–49.7)
MCH: 32.7 pg (ref 25.1–34.0)
MCV: 94.4 fL (ref 79.5–101.0)
MONO%: 11.6 % (ref 0.0–14.0)
NEUT%: 36 % — ABNORMAL LOW (ref 38.4–76.8)
Platelets: 208 10*3/uL (ref 145–400)
lymph#: 1.3 10*3/uL (ref 0.9–3.3)

## 2012-07-06 LAB — COMPREHENSIVE METABOLIC PANEL (CC13)
AST: 25 U/L (ref 5–34)
BUN: 16.3 mg/dL (ref 7.0–26.0)
Calcium: 9.2 mg/dL (ref 8.4–10.4)
Chloride: 103 mEq/L (ref 98–107)
Creatinine: 0.9 mg/dL (ref 0.6–1.1)

## 2012-07-13 ENCOUNTER — Other Ambulatory Visit (HOSPITAL_BASED_OUTPATIENT_CLINIC_OR_DEPARTMENT_OTHER)

## 2012-07-13 DIAGNOSIS — C3490 Malignant neoplasm of unspecified part of unspecified bronchus or lung: Secondary | ICD-10-CM

## 2012-07-13 LAB — COMPREHENSIVE METABOLIC PANEL (CC13)
BUN: 19 mg/dL (ref 7.0–26.0)
CO2: 28 mEq/L (ref 22–29)
Calcium: 9.9 mg/dL (ref 8.4–10.4)
Chloride: 103 mEq/L (ref 98–107)
Creatinine: 1 mg/dL (ref 0.6–1.1)
Glucose: 102 mg/dl — ABNORMAL HIGH (ref 70–99)

## 2012-07-13 LAB — CBC WITH DIFFERENTIAL/PLATELET
Basophils Absolute: 0 10*3/uL (ref 0.0–0.1)
HCT: 30 % — ABNORMAL LOW (ref 34.8–46.6)
HGB: 10.6 g/dL — ABNORMAL LOW (ref 11.6–15.9)
MCH: 33.6 pg (ref 25.1–34.0)
MONO#: 0.6 10*3/uL (ref 0.1–0.9)
NEUT%: 48.5 % (ref 38.4–76.8)
lymph#: 1.3 10*3/uL (ref 0.9–3.3)

## 2012-07-20 ENCOUNTER — Ambulatory Visit (HOSPITAL_BASED_OUTPATIENT_CLINIC_OR_DEPARTMENT_OTHER): Admitting: Physician Assistant

## 2012-07-20 ENCOUNTER — Other Ambulatory Visit (HOSPITAL_BASED_OUTPATIENT_CLINIC_OR_DEPARTMENT_OTHER): Admitting: Lab

## 2012-07-20 ENCOUNTER — Telehealth: Payer: Self-pay | Admitting: *Deleted

## 2012-07-20 ENCOUNTER — Encounter: Payer: Self-pay | Admitting: Physician Assistant

## 2012-07-20 ENCOUNTER — Ambulatory Visit (HOSPITAL_BASED_OUTPATIENT_CLINIC_OR_DEPARTMENT_OTHER)

## 2012-07-20 ENCOUNTER — Encounter: Payer: Self-pay | Admitting: Internal Medicine

## 2012-07-20 ENCOUNTER — Telehealth: Payer: Self-pay | Admitting: Internal Medicine

## 2012-07-20 VITALS — BP 123/70 | HR 96 | Temp 97.1°F | Resp 20 | Ht 66.0 in | Wt 163.5 lb

## 2012-07-20 DIAGNOSIS — Z5111 Encounter for antineoplastic chemotherapy: Secondary | ICD-10-CM

## 2012-07-20 DIAGNOSIS — C3430 Malignant neoplasm of lower lobe, unspecified bronchus or lung: Secondary | ICD-10-CM

## 2012-07-20 DIAGNOSIS — C770 Secondary and unspecified malignant neoplasm of lymph nodes of head, face and neck: Secondary | ICD-10-CM

## 2012-07-20 DIAGNOSIS — C7952 Secondary malignant neoplasm of bone marrow: Secondary | ICD-10-CM

## 2012-07-20 DIAGNOSIS — C7951 Secondary malignant neoplasm of bone: Secondary | ICD-10-CM

## 2012-07-20 DIAGNOSIS — C3490 Malignant neoplasm of unspecified part of unspecified bronchus or lung: Secondary | ICD-10-CM

## 2012-07-20 LAB — CBC WITH DIFFERENTIAL/PLATELET
BASO%: 0 % (ref 0.0–2.0)
Basophils Absolute: 0 10*3/uL (ref 0.0–0.1)
HCT: 31.8 % — ABNORMAL LOW (ref 34.8–46.6)
HGB: 10.8 g/dL — ABNORMAL LOW (ref 11.6–15.9)
MONO#: 0.4 10*3/uL (ref 0.1–0.9)
NEUT%: 82.5 % — ABNORMAL HIGH (ref 38.4–76.8)
WBC: 5.9 10*3/uL (ref 3.9–10.3)
lymph#: 0.7 10*3/uL — ABNORMAL LOW (ref 0.9–3.3)

## 2012-07-20 LAB — COMPREHENSIVE METABOLIC PANEL
AST: 29 U/L (ref 0–37)
Albumin: 3.9 g/dL (ref 3.5–5.2)
BUN: 14 mg/dL (ref 6–23)
Calcium: 10.2 mg/dL (ref 8.4–10.5)
Chloride: 102 mEq/L (ref 96–112)
Glucose, Bld: 124 mg/dL — ABNORMAL HIGH (ref 70–99)
Potassium: 3.8 mEq/L (ref 3.5–5.3)
Sodium: 139 mEq/L (ref 135–145)
Total Protein: 8 g/dL (ref 6.0–8.3)

## 2012-07-20 MED ORDER — DEXAMETHASONE SODIUM PHOSPHATE 4 MG/ML IJ SOLN
20.0000 mg | Freq: Once | INTRAMUSCULAR | Status: AC
  Administered 2012-07-20: 20 mg via INTRAVENOUS

## 2012-07-20 MED ORDER — SODIUM CHLORIDE 0.9 % IV SOLN
500.0000 mg/m2 | Freq: Once | INTRAVENOUS | Status: AC
  Administered 2012-07-20: 925 mg via INTRAVENOUS
  Filled 2012-07-20: qty 37

## 2012-07-20 MED ORDER — SODIUM CHLORIDE 0.9 % IV SOLN
460.0000 mg | Freq: Once | INTRAVENOUS | Status: AC
  Administered 2012-07-20: 460 mg via INTRAVENOUS
  Filled 2012-07-20: qty 46

## 2012-07-20 MED ORDER — SODIUM CHLORIDE 0.9 % IV SOLN
Freq: Once | INTRAVENOUS | Status: AC
  Administered 2012-07-20: 17:00:00 via INTRAVENOUS

## 2012-07-20 MED ORDER — CARBOPLATIN CHEMO INTRADERMAL TEST DOSE 100MCG/0.02ML
100.0000 ug | Freq: Once | INTRADERMAL | Status: AC
  Administered 2012-07-20: 100 ug via INTRADERMAL
  Filled 2012-07-20: qty 0.01

## 2012-07-20 MED ORDER — ONDANSETRON 16 MG/50ML IVPB (CHCC)
16.0000 mg | Freq: Once | INTRAVENOUS | Status: AC
  Administered 2012-07-20: 16 mg via INTRAVENOUS

## 2012-07-20 NOTE — Progress Notes (Signed)
Carbo test dose was neg at 5 min, 15 min and 30 min.

## 2012-07-20 NOTE — Patient Instructions (Addendum)
Continue weekly labs as scheduled Follow up in 3 weeks prior to your next cycle of chemotherapy 

## 2012-07-20 NOTE — Patient Instructions (Addendum)
[facility] Discharge Instructions for Patients Receiving Chemotherapy  Today you received the following chemotherapy agents Carboplatin, Alimta  To help prevent nausea and vomiting after your treatment, we encourage you to take your nausea medication if needed Begin taking it at 10 PM and take it as often as prescribed for the next 48-72 hours if needed.   If you develop nausea and vomiting that is not controlled by your nausea medication, call the clinic. If it is after clinic hours your family physician or the after hours number for the clinic or go to the Emergency Department.   BELOW ARE SYMPTOMS THAT SHOULD BE REPORTED IMMEDIATELY:  *FEVER GREATER THAN 100.5 F  *CHILLS WITH OR WITHOUT FEVER  NAUSEA AND VOMITING THAT IS NOT CONTROLLED WITH YOUR NAUSEA MEDICATION  *UNUSUAL SHORTNESS OF BREATH  *UNUSUAL BRUISING OR BLEEDING  TENDERNESS IN MOUTH AND THROAT WITH OR WITHOUT PRESENCE OF ULCERS  *URINARY PROBLEMS  *BOWEL PROBLEMS  UNUSUAL RASH Items with * indicate a potential emergency and should be followed up as soon as possible.   Feel free to call the clinic you have any questions or concerns. The clinic phone number is [phone].   I have been informed and understand all the instructions given to me. I know to contact the clinic, my physician, or go to the Emergency Department if any problems should occur. I do not have any questions at this time, but understand that I may call the clinic during office hours   should I have any questions or need assistance in obtaining follow up care.    __________________________________________  _____________  __________ Signature of Patient or Authorized Representative            Date                   Time    __________________________________________ Nurse's Signature

## 2012-07-20 NOTE — Telephone Encounter (Signed)
gv and printed appt schedule for pt for April...emailed michelle to add tx

## 2012-07-20 NOTE — Telephone Encounter (Signed)
Per staff phone call and POF I have schedueld appts.  JMW  

## 2012-07-21 ENCOUNTER — Other Ambulatory Visit: Payer: Self-pay | Admitting: Certified Registered Nurse Anesthetist

## 2012-07-23 NOTE — Progress Notes (Signed)
[facility] [phone]   [phone]  OFFICE PROGRESS NOTE  [name], MD [address] [place] [address]  DIAGNOSIS: Recurrent non-small cell lung cancer initially diagnosed as stage IIA (T1 M1 MX) adenocarcinoma in April 2011.   PRIOR THERAPY:  1. Status post 3 cycles of neoadjuvant chemotherapy with carboplatin and Alimta. Last dose was given July 23, 2009. 2. Status post left lower lobectomy with lymph node dissection under the care of Dr. Edwyna Shell on October 17, 2009. 3. Status post concurrent chemoradiation with weekly carboplatin and paclitaxel for disease recurrence. Last dose was given May 07, 2010. 4. Status post 3 cycles of consolidation chemotherapy with carboplatin and Alimta. Last dose was given August 13, 2010. 5. Status post palliative radiotherapy to the right neck area. 6. Systemic chemotherapy with single agent Taxotere 75 mg/m2 every 3 weeks,, status post 6 cycles, last dose was given on 09/03/2011.  CURRENT THERAPY: Systemic chemotherapy with carboplatin for an AUC of 5 and Alimta at 500 mg per meter square given every 3 weeks, status post 4  cycles.  INTERVAL HISTORY: Kaitlin Reed 62 y.o. female returns to the clinic today for followup visit. The patient tolerated the last cycle of her chemotherapy with carboplatin and Alimta fairly well. She denied having any significant nausea or vomiting. She has no chest pain, shortness breath, cough or hemoptysis. She has no peripheral neuropathy. She reports she is currently on a course of ciprofloxacin for a urinary tract infection. She states that she initially had back pain before she had any urinary symptomatology. This is being managed by her primary care physician.  MEDICAL HISTORY: Past Medical History  Diagnosis Date  . Heart murmur   . Tubal pregnancy   . lung ca dx'd 06/2009    chemo comp 07/2010; xrt comp 06/2010  . Hypercholesteremia     h/o    ALLERGIES:   is allergic to codeine; motrin; other; trazodone and nefazodone; banana; and tape.  MEDICATIONS:  Current Outpatient Prescriptions  Medication Sig Dispense Refill  . Ascorbic Acid (VITAMIN C) 1000 MG tablet Take 1,000 mg by mouth daily.      Marland Kitchen aspirin EC 81 MG tablet Take 81 mg by mouth daily.      . calcium carbonate (OS-CAL) 600 MG TABS Take 600 mg by mouth 2 (two) times daily with a meal.      . Cyanocobalamin (B-12 PO) Take 1 tablet by mouth daily.      Marland Kitchen dexamethasone (DECADRON) 4 MG tablet one tablet by mouth twice a day the day before, day of and day after the chemotherapy  40 tablet  1  . folic acid (FOLVITE) 1 MG tablet TAKE 1 TABLET EVERY DAY  30 tablet  3  . IRON COMBINATIONS PO Take 1 tablet by mouth daily.       Marland Kitchen LORazepam (ATIVAN) 0.5 MG tablet Take 1 tablet by mouth or sublingually every 8 hours as needed for nausea. May take one tablet at bedtime as needed for sleep  40 tablet  0  . metoprolol tartrate (LOPRESSOR) 25 MG tablet Take 25 mg by mouth 2 (two) times daily.      . Multiple Vitamin (MULITIVITAMIN WITH MINERALS) TABS Take 1 tablet by mouth daily.        . potassium chloride (MICRO-K) 10 MEQ CR capsule Take 5 mEq by mouth daily.       . prochlorperazine (COMPAZINE) 10 MG tablet TAKE 1  TABLET BY MOUTH EVERY 6 HOURS AS NEEDED.  30 tablet  1  . VITAMIN D, CHOLECALCIFEROL, PO Take 1 tablet by mouth daily.       Marland Kitchen VITAMIN E PO Take 1 tablet by mouth daily.      . [DISCONTINUED] Alum & Mag Hydroxide-Simeth (MAGIC MOUTHWASH W/LIDOCAINE) SOLN Take 10 mLs by mouth 4 (four) times daily as needed.  500 mL  5   No current facility-administered medications for this visit.    SURGICAL HISTORY:  Past Surgical History  Procedure Laterality Date  . Lung removal, partial  10/17/09    left. pt states they removed small piece of lung due to cancer  . 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 Resp: clear to auscultation bilaterally Cardio: regular rate and rhythm, S1, S2 normal, no murmur, click, rub or gallop GI: soft, non-tender; bowel sounds normal; no masses,  no organomegaly Extremities: extremities normal, atraumatic, no cyanosis or edema Neurologic: Alert and oriented X 3, normal strength and tone. Normal symmetric reflexes. Normal coordination and gait  ECOG PERFORMANCE STATUS: 1 - Symptomatic but completely ambulatory  Blood pressure 123/70, pulse 96, temperature 97.1 F (36.2 C), temperature source Oral, resp. rate 20, height 5\' 6"  (1.676 m), weight 163 lb 8 oz (74.163 kg).  LABORATORY DATA: Lab Results  Component Value Date   WBC 5.9 07/20/2012   HGB 10.8* 07/20/2012   HCT 31.8* 07/20/2012   MCV 95.2 07/20/2012   PLT 280 07/20/2012      Chemistry      Component Value Date/Time   NA 139 07/20/2012 1644   NA 140 07/13/2012 1533   NA 138 11/06/2011 1121   K 3.8 07/20/2012 1644   K 3.4* 07/13/2012 1533   K 4.3 11/06/2011 1121   CL 102 07/20/2012 1644   CL 103 07/13/2012 1533   CL 98 11/06/2011 1121   CO2 24 07/20/2012 1644   CO2 28 07/13/2012 1533   CO2 30 11/06/2011 1121   BUN 14 07/20/2012 1644   BUN 19.0 07/13/2012 1533   BUN 15 11/06/2011 1121   CREATININE 0.80 07/20/2012 1644   CREATININE 1.0 07/13/2012 1533   CREATININE 0.9 11/06/2011 1121      Component Value Date/Time   CALCIUM 10.2 07/20/2012 1644   CALCIUM 9.9 07/13/2012 1533   CALCIUM 9.0 11/06/2011 1121   ALKPHOS 162* 07/20/2012 1644   ALKPHOS 155* 07/13/2012 1533   ALKPHOS 110* 11/06/2011 1121   AST 29 07/20/2012 1644   AST 41* 07/13/2012 1533   AST 36 11/06/2011 1121   ALT 16  07/20/2012 1644   ALT 26 07/13/2012 1533   BILITOT 0.3 07/20/2012 1644   BILITOT <0.20 Repeated and Verified 07/13/2012 1533   BILITOT 0.50 11/06/2011 1121       RADIOGRAPHIC STUDIES: Ct Chest W Contrast  06/26/2012  *RADIOLOGY REPORT*  Clinical Data:  Follow-up recurrent non-small cell lung carcinoma. Completed radiation therapy and chemotherapy.  CT CHEST, ABDOMEN AND PELVIS WITH CONTRAST  Technique:  Multidetector CT imaging of the chest, abdomen and pelvis was performed following the standard protocol during bolus administration of intravenous contrast.  Contrast: OMNIPAQUE IOHEXOL 300 MG/ML  SOLN  Comparison:  Chest CT on 04/01/2012 and abdomen pelvis CT on 02/06/2012   CT CHEST  Findings:  Postop changes from partial left lung resection remains stable.  Paramediastinal radiation changes in the right lung are also stable.  Pulmonary emphysema again noted.  Previously seen small bilateral pulmonary nodules in the medial right upper lobe and subpleural left upper lobe are no longer visualized.  No new or enlarging pulmonary nodules masses are identified.  No evidence of acute infiltrate or central endobronchial lesion. No evidence of pleural effusion.  Small pericardial effusion or thickening remains stable.  Right paratracheal mediastinal lymphadenopathy is significantly decreased since previous study, currently measuring 1.2 cm in short axis on image 19 compared 1.9 cm previously.  Prevascular mediastinal lymph node measures 13 mm in short axis on image 15 and is stable. No new or increased adenopathy is seen in the mediastinum, hilar regions, or elsewhere within the thorax.  Increased and new small sclerotic bone lesions are seen within the lower thoracic spine and right scapula which are consistent with sclerotic bone metastases.  IMPRESSION:  1.  Decreased mediastinal lymphadenopathy and small bilateral pulmonary nodules. 2.  Increased sclerotic bone metastases.  This may be due to interval  healing of treated bone metastases rather than progression of metastatic disease; correlation with treatment history is suggested.   CT ABDOMEN AND PELVIS  Findings:  The liver, gallbladder, adrenal glands, pancreas, kidneys, and spleen are normal in appearance.  No evidence of hydronephrosis.  No soft tissue masses or lymphadenopathy identified within the abdomen or pelvis. Tiny calcified uterine fibroids are stable.  The adnexal regions are unremarkable.  No evidence of inflammatory process or abnormal fluid collections. Colonic diverticulosis is again demonstrated, without evidence of diverticulitis.  Increased in number and size of sclerotic bone lesions is seen throughout the left hemi pelvis, sacrum and lumbar spine, consistent with bone metastases.  IMPRESSION:  1.  Increased conspicuity of the sclerotic bone metastases in the pelvis in the lumbar spine.  This could be due to interval healing of treated bone metastases rather than progression of metastatic disease; correlation with treatment history is suggested. 2.  No evidence of soft tissue metastatic disease1 within the abdomen or pelvis. 3.  Diverticulosis.  No radiographic evidence of diverticulitis. 4.  Stable tiny calcified uterine fibroids.   Original Report Authenticated By: Myles Rosenthal, M.D.    ASSESSMENT/PLAN: This is a very pleasant 62 years old African American female with metastatic non-small cell lung cancer, adenocarcinoma status post 4 cycles of systemic chemotherapy with carboplatin and Alimta with partial response in the mediastinal lymphadenopathy and smaller bilateral nodules but continues to sclerotic bone metastasis most likely secondary to interval healing of the treated bone lesions. The patient was discussed with Dr. Arbutus Ped. She'll proceed with cycle #5 of her systemic chemotherapy with carboplatin and Alimta. She will continue with weekly labs consisting of a CBC differential and C. met. She'll return in 3 weeks prior to her  next scheduled cycle of chemotherapy. She is encouraged to complete her course of Cipro as prescribed by her primary care physician for her urinary tract infection.  Kaitlin Reed E, PA-C   She was advised to call immediately if she has any concerning symptoms in the interval.  All questions were answered. The patient knows to call the clinic with any problems, questions or concerns. We can certainly see the patient much sooner if necessary.  I spent 20 minutes counseling  the patient face to face. The total time spent in the appointment was 30 minutes.

## 2012-07-27 ENCOUNTER — Other Ambulatory Visit: Admitting: Lab

## 2012-07-27 ENCOUNTER — Telehealth: Payer: Self-pay | Admitting: Internal Medicine

## 2012-07-28 ENCOUNTER — Other Ambulatory Visit (HOSPITAL_BASED_OUTPATIENT_CLINIC_OR_DEPARTMENT_OTHER): Admitting: Lab

## 2012-07-28 DIAGNOSIS — C3490 Malignant neoplasm of unspecified part of unspecified bronchus or lung: Secondary | ICD-10-CM

## 2012-07-28 DIAGNOSIS — C3430 Malignant neoplasm of lower lobe, unspecified bronchus or lung: Secondary | ICD-10-CM

## 2012-07-28 LAB — CBC WITH DIFFERENTIAL/PLATELET
BASO%: 0.1 % (ref 0.0–2.0)
Basophils Absolute: 0 10*3/uL (ref 0.0–0.1)
EOS%: 0.9 % (ref 0.0–7.0)
HGB: 9.7 g/dL — ABNORMAL LOW (ref 11.6–15.9)
MCH: 33.7 pg (ref 25.1–34.0)
MONO%: 19.4 % — ABNORMAL HIGH (ref 0.0–14.0)
RBC: 2.89 10*6/uL — ABNORMAL LOW (ref 3.70–5.45)
RDW: 16.4 % — ABNORMAL HIGH (ref 11.2–14.5)
lymph#: 1.2 10*3/uL (ref 0.9–3.3)

## 2012-07-28 LAB — COMPREHENSIVE METABOLIC PANEL (CC13)
ALT: 15 U/L (ref 0–55)
AST: 35 U/L — ABNORMAL HIGH (ref 5–34)
Albumin: 3.4 g/dL — ABNORMAL LOW (ref 3.5–5.0)
Alkaline Phosphatase: 165 U/L — ABNORMAL HIGH (ref 40–150)
BUN: 15.2 mg/dL (ref 7.0–26.0)
Calcium: 9.4 mg/dL (ref 8.4–10.4)
Chloride: 102 mEq/L (ref 98–107)
Potassium: 3.8 mEq/L (ref 3.5–5.1)
Sodium: 140 mEq/L (ref 136–145)
Total Protein: 7.3 g/dL (ref 6.4–8.3)

## 2012-08-03 ENCOUNTER — Other Ambulatory Visit (HOSPITAL_BASED_OUTPATIENT_CLINIC_OR_DEPARTMENT_OTHER): Admitting: Lab

## 2012-08-03 DIAGNOSIS — C3430 Malignant neoplasm of lower lobe, unspecified bronchus or lung: Secondary | ICD-10-CM

## 2012-08-03 DIAGNOSIS — C3490 Malignant neoplasm of unspecified part of unspecified bronchus or lung: Secondary | ICD-10-CM

## 2012-08-03 LAB — CBC WITH DIFFERENTIAL/PLATELET
BASO%: 0.6 % (ref 0.0–2.0)
Basophils Absolute: 0 10*3/uL (ref 0.0–0.1)
EOS%: 0.8 % (ref 0.0–7.0)
MCH: 33.7 pg (ref 25.1–34.0)
MCHC: 34.4 g/dL (ref 31.5–36.0)
MCV: 98 fL (ref 79.5–101.0)
MONO%: 18.6 % — ABNORMAL HIGH (ref 0.0–14.0)
RDW: 17 % — ABNORMAL HIGH (ref 11.2–14.5)
lymph#: 1.2 10*3/uL (ref 0.9–3.3)

## 2012-08-03 LAB — COMPREHENSIVE METABOLIC PANEL (CC13)
ALT: 36 U/L (ref 0–55)
AST: 55 U/L — ABNORMAL HIGH (ref 5–34)
Albumin: 3.4 g/dL — ABNORMAL LOW (ref 3.5–5.0)
Alkaline Phosphatase: 176 U/L — ABNORMAL HIGH (ref 40–150)
BUN: 14.3 mg/dL (ref 7.0–26.0)
Calcium: 9.5 mg/dL (ref 8.4–10.4)
Chloride: 101 mEq/L (ref 98–107)
Creatinine: 1 mg/dL (ref 0.6–1.1)
Potassium: 3.7 mEq/L (ref 3.5–5.1)

## 2012-08-05 ENCOUNTER — Telehealth: Payer: Self-pay | Admitting: *Deleted

## 2012-08-05 NOTE — Telephone Encounter (Signed)
Pt called stating that her dentist Dr Excell Seltzer needs information regarding what medication pt needs dental clearance on.  Progress note from 3/3 from Dr Donnald Garre discussing xgeva faxed to Dr Earmon Phoenix office [phone].  SLJ

## 2012-08-10 ENCOUNTER — Telehealth: Payer: Self-pay | Admitting: *Deleted

## 2012-08-10 ENCOUNTER — Encounter: Payer: Self-pay | Admitting: Internal Medicine

## 2012-08-10 ENCOUNTER — Telehealth: Payer: Self-pay | Admitting: Internal Medicine

## 2012-08-10 ENCOUNTER — Other Ambulatory Visit (HOSPITAL_BASED_OUTPATIENT_CLINIC_OR_DEPARTMENT_OTHER): Admitting: Lab

## 2012-08-10 ENCOUNTER — Ambulatory Visit (HOSPITAL_BASED_OUTPATIENT_CLINIC_OR_DEPARTMENT_OTHER): Admitting: Physician Assistant

## 2012-08-10 ENCOUNTER — Other Ambulatory Visit

## 2012-08-10 ENCOUNTER — Ambulatory Visit (HOSPITAL_BASED_OUTPATIENT_CLINIC_OR_DEPARTMENT_OTHER)

## 2012-08-10 ENCOUNTER — Encounter: Payer: Self-pay | Admitting: Physician Assistant

## 2012-08-10 VITALS — BP 116/69 | HR 90 | Temp 97.0°F | Resp 18 | Ht 66.0 in | Wt 163.7 lb

## 2012-08-10 DIAGNOSIS — C3430 Malignant neoplasm of lower lobe, unspecified bronchus or lung: Secondary | ICD-10-CM

## 2012-08-10 DIAGNOSIS — Z5111 Encounter for antineoplastic chemotherapy: Secondary | ICD-10-CM

## 2012-08-10 DIAGNOSIS — C3490 Malignant neoplasm of unspecified part of unspecified bronchus or lung: Secondary | ICD-10-CM

## 2012-08-10 DIAGNOSIS — R599 Enlarged lymph nodes, unspecified: Secondary | ICD-10-CM

## 2012-08-10 DIAGNOSIS — C770 Secondary and unspecified malignant neoplasm of lymph nodes of head, face and neck: Secondary | ICD-10-CM

## 2012-08-10 DIAGNOSIS — C7951 Secondary malignant neoplasm of bone: Secondary | ICD-10-CM

## 2012-08-10 DIAGNOSIS — C7952 Secondary malignant neoplasm of bone marrow: Secondary | ICD-10-CM

## 2012-08-10 LAB — CBC WITH DIFFERENTIAL/PLATELET
BASO%: 0 % (ref 0.0–2.0)
Basophils Absolute: 0 10*3/uL (ref 0.0–0.1)
EOS%: 0 % (ref 0.0–7.0)
HCT: 29.2 % — ABNORMAL LOW (ref 34.8–46.6)
HGB: 10 g/dL — ABNORMAL LOW (ref 11.6–15.9)
MCH: 32.7 pg (ref 25.1–34.0)
MCHC: 34.2 g/dL (ref 31.5–36.0)
MCV: 95.4 fL (ref 79.5–101.0)
MONO%: 7.2 % (ref 0.0–14.0)
NEUT%: 82.5 % — ABNORMAL HIGH (ref 38.4–76.8)

## 2012-08-10 LAB — COMPREHENSIVE METABOLIC PANEL
AST: 26 U/L (ref 0–37)
Alkaline Phosphatase: 171 U/L — ABNORMAL HIGH (ref 39–117)
BUN: 16 mg/dL (ref 6–23)
Creatinine, Ser: 0.93 mg/dL (ref 0.50–1.10)
Glucose, Bld: 135 mg/dL — ABNORMAL HIGH (ref 70–99)
Total Bilirubin: 0.3 mg/dL (ref 0.3–1.2)

## 2012-08-10 MED ORDER — DEXAMETHASONE SODIUM PHOSPHATE 4 MG/ML IJ SOLN
20.0000 mg | Freq: Once | INTRAMUSCULAR | Status: AC
  Administered 2012-08-10: 20 mg via INTRAVENOUS

## 2012-08-10 MED ORDER — CARBOPLATIN CHEMO INTRADERMAL TEST DOSE 100MCG/0.02ML
100.0000 ug | Freq: Once | INTRADERMAL | Status: AC
  Administered 2012-08-10: 100 ug via INTRADERMAL
  Filled 2012-08-10: qty 0.02

## 2012-08-10 MED ORDER — SODIUM CHLORIDE 0.9 % IV SOLN
Freq: Once | INTRAVENOUS | Status: AC
  Administered 2012-08-10: 17:00:00 via INTRAVENOUS

## 2012-08-10 MED ORDER — ONDANSETRON 16 MG/50ML IVPB (CHCC)
16.0000 mg | Freq: Once | INTRAVENOUS | Status: AC
  Administered 2012-08-10: 16 mg via INTRAVENOUS

## 2012-08-10 MED ORDER — SODIUM CHLORIDE 0.9 % IV SOLN
500.0000 mg/m2 | Freq: Once | INTRAVENOUS | Status: AC
  Administered 2012-08-10: 925 mg via INTRAVENOUS
  Filled 2012-08-10: qty 37

## 2012-08-10 MED ORDER — SODIUM CHLORIDE 0.9 % IV SOLN
463.0000 mg | Freq: Once | INTRAVENOUS | Status: AC
  Administered 2012-08-10: 460 mg via INTRAVENOUS
  Filled 2012-08-10: qty 46

## 2012-08-10 MED ORDER — CYANOCOBALAMIN 1000 MCG/ML IJ SOLN
1000.0000 ug | Freq: Once | INTRAMUSCULAR | Status: AC
  Administered 2012-08-10: 1000 ug via INTRAMUSCULAR

## 2012-08-10 NOTE — Progress Notes (Signed)
1630: Carboplatin skin test placed. 1635: Site unremarkable. 1645: Site unremarkable. 1700: Skin test negative. Reaction teaching complete. Pt understands to report any signs of allergic reaction to RN right away.

## 2012-08-10 NOTE — Patient Instructions (Addendum)
Continue weekly labs as scheduled Followup with Dr. Mohamed in 3 weeks with restaging CT scan of the chest, abdomen and pelvis to reevaluate your disease 

## 2012-08-10 NOTE — Telephone Encounter (Signed)
Per staff message and POF I have scheduled appts.  JMW  

## 2012-08-10 NOTE — Patient Instructions (Signed)
[facility] Discharge Instructions for Patients Receiving Chemotherapy  Today you received the following chemotherapy agents: Alimta and Carboplatin.  To help prevent nausea and vomiting after your treatment, we encourage you to take your nausea medication, Prochlorperazine (Compazine). Take it every six hours as needed for nausea. May make you drowsy.   If you develop nausea and vomiting that is not controlled by your nausea medication, call the clinic. If it is after clinic hours your family physician or the after hours number for the clinic or go to the Emergency Department.   BELOW ARE SYMPTOMS THAT SHOULD BE REPORTED IMMEDIATELY:  *FEVER GREATER THAN 100.5 F  *CHILLS WITH OR WITHOUT FEVER  NAUSEA AND VOMITING THAT IS NOT CONTROLLED WITH YOUR NAUSEA MEDICATION  *UNUSUAL SHORTNESS OF BREATH  *UNUSUAL BRUISING OR BLEEDING  TENDERNESS IN MOUTH AND THROAT WITH OR WITHOUT PRESENCE OF ULCERS  *URINARY PROBLEMS  *BOWEL PROBLEMS  UNUSUAL RASH Items with * indicate a potential emergency and should be followed up as soon as possible.  Feel free to call the clinic you have any questions or concerns. The clinic phone number is [phone].   I have been informed and understand all the instructions given to me. I know to contact the clinic, my physician, or go to the Emergency Department if any problems should occur. I do not have any questions at this time, but understand that I may call the clinic during office hours   should I have any questions or need assistance in obtaining follow up care.

## 2012-08-10 NOTE — Telephone Encounter (Signed)
gve the pt her April,may 2014 appt calendar. Pt aware she will be called from rad to set up the ct scan appts. gve the pt her oral contrast. Sent michelle a staff message to add the chemo appt on 09/01/2012.

## 2012-08-13 NOTE — Progress Notes (Signed)
[facility] [phone]   Fax:([phone]  OFFICE PROGRESS NOTE  [name], MD [address] [place] [address]  DIAGNOSIS: Recurrent non-small cell lung cancer initially diagnosed as stage IIA (T1 M1 MX) adenocarcinoma in April 2011.   PRIOR THERAPY:  1. Status post 3 cycles of neoadjuvant chemotherapy with carboplatin and Alimta. Last dose was given July 23, 2009. 2. Status post left lower lobectomy with lymph node dissection under the care of Dr. Edwyna Shell on October 17, 2009. 3. Status post concurrent chemoradiation with weekly carboplatin and paclitaxel for disease recurrence. Last dose was given May 07, 2010. 4. Status post 3 cycles of consolidation chemotherapy with carboplatin and Alimta. Last dose was given August 13, 2010. 5. Status post palliative radiotherapy to the right neck area. 6. Systemic chemotherapy with single agent Taxotere 75 mg/m2 every 3 weeks,, status post 6 cycles, last dose was given on 09/03/2011.  CURRENT THERAPY: Systemic chemotherapy with carboplatin for an AUC of 5 and Alimta at 500 mg per meter square given every 3 weeks, status post 5 cycles.  INTERVAL HISTORY: Kaitlin Reed 62 y.o. female returns to the clinic today for followup visit. The patient tolerated the last cycle of her chemotherapy with carboplatin and Alimta fairly well. She denied having any significant nausea or vomiting. She has no chest pain, shortness breath, cough or hemoptysis. She has no peripheral neuropathy. She reports she completed her course of ciprofloxacin for a urinary tract infection. Her primary care physician is having her see a kidney specialist, this is scheduled for 08/20/2012, because there was some blood in the urine. She voices no specific complaints today. She presents to proceed with cycle #6 of her systemic chemotherapy with carboplatin and Alimta.Marland Kitchen  MEDICAL HISTORY: Past Medical History  Diagnosis Date  . Heart  murmur   . Tubal pregnancy   . lung ca dx'd 06/2009    chemo comp 07/2010; xrt comp 06/2010  . Hypercholesteremia     h/o    ALLERGIES:  is allergic to codeine; motrin; other; trazodone and nefazodone; banana; and tape.  MEDICATIONS:  Current Outpatient Prescriptions  Medication Sig Dispense Refill  . Ascorbic Acid (VITAMIN C) 1000 MG tablet Take 1,000 mg by mouth daily.      Marland Kitchen aspirin EC 81 MG tablet Take 81 mg by mouth daily.      . calcium carbonate (OS-CAL) 600 MG TABS Take 600 mg by mouth 2 (two) times daily with a meal.      . Cyanocobalamin (B-12 PO) Take 1 tablet by mouth daily.      Marland Kitchen dexamethasone (DECADRON) 4 MG tablet one tablet by mouth twice a day the day before, day of and day after the chemotherapy  40 tablet  1  . folic acid (FOLVITE) 1 MG tablet TAKE 1 TABLET EVERY DAY  30 tablet  3  . IRON COMBINATIONS PO Take 1 tablet by mouth daily.       Marland Kitchen LORazepam (ATIVAN) 0.5 MG tablet Take 1 tablet by mouth or sublingually every 8 hours as needed for nausea. May take one tablet at bedtime as needed for sleep  40 tablet  0  . metoprolol tartrate (LOPRESSOR) 25 MG tablet Take 25 mg by mouth 2 (two) times daily.      . Multiple Vitamin (MULITIVITAMIN WITH MINERALS) TABS Take 1 tablet by mouth daily.        . potassium chloride (MICRO-K) 10 MEQ CR capsule Take  5 mEq by mouth daily.       . prochlorperazine (COMPAZINE) 10 MG tablet TAKE 1 TABLET BY MOUTH EVERY 6 HOURS AS NEEDED.  30 tablet  1  . VITAMIN D, CHOLECALCIFEROL, PO Take 1 tablet by mouth daily.       Marland Kitchen VITAMIN E PO Take 1 tablet by mouth daily.      . [DISCONTINUED] Alum & Mag Hydroxide-Simeth (MAGIC MOUTHWASH W/LIDOCAINE) SOLN Take 10 mLs by mouth 4 (four) times daily as needed.  500 mL  5   No current facility-administered medications for this visit.    SURGICAL HISTORY:  Past Surgical History  Procedure Laterality Date  . Lung removal, partial  10/17/09    left. pt states they removed small piece of lung due to  cancer  . 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   PHYSICAL EXAMINATION: General appearance: alert, cooperative and no distress Head: Normocephalic, without obvious abnormality, atraumatic Neck: no adenopathy Lymph nodes: Cervical, supraclavicular, and axillary nodes normal. Resp: clear to auscultation bilaterally Cardio: regular rate and rhythm, S1, S2 normal, no murmur, click, rub or gallop GI: soft, non-tender; bowel sounds normal; no masses,  no organomegaly Extremities: extremities normal, atraumatic, no cyanosis or edema Neurologic: Alert and oriented X 3, normal strength and tone. Normal symmetric reflexes. Normal coordination and gait  ECOG PERFORMANCE STATUS: 1 - Symptomatic but completely ambulatory  Blood pressure 116/69, pulse 90, temperature 97 F (36.1 C), temperature source Oral, resp. rate 18, height 5\' 6"  (1.676 m), weight 163 lb 11.2 oz (74.254 kg).  LABORATORY DATA: Lab Results  Component Value Date   WBC 5.2 08/10/2012   HGB 10.0* 08/10/2012   HCT 29.2* 08/10/2012   MCV 95.4 08/10/2012   PLT 242 08/10/2012      Chemistry      Component Value Date/Time   NA 138 08/10/2012 1458   NA 140 08/03/2012 1533   NA 138 11/06/2011 1121   K 3.8 08/10/2012 1458   K 3.7 08/03/2012 1533   K 4.3 11/06/2011 1121   CL 100 08/10/2012 1458   CL 101 08/03/2012 1533   CL 98 11/06/2011 1121   CO2 26 08/10/2012 1458   CO2 28 08/03/2012 1533   CO2 30 11/06/2011 1121   BUN 16 08/10/2012 1458   BUN 14.3 08/03/2012 1533   BUN 15 11/06/2011 1121   CREATININE 0.93 08/10/2012 1458   CREATININE 1.0 08/03/2012 1533   CREATININE 0.9 11/06/2011 1121      Component Value Date/Time   CALCIUM 9.9 08/10/2012 1458   CALCIUM 9.5 08/03/2012 1533   CALCIUM 9.0 11/06/2011 1121   ALKPHOS 171* 08/10/2012 1458   ALKPHOS 176* 08/03/2012 1533   ALKPHOS 110* 11/06/2011 1121   AST 26 08/10/2012 1458   AST 55* 08/03/2012 1533   AST 36  11/06/2011 1121   ALT 12 08/10/2012 1458   ALT 36 08/03/2012 1533   BILITOT 0.3 08/10/2012 1458   BILITOT <0.20 Repeated and Verified 08/03/2012 1533   BILITOT 0.50 11/06/2011 1121       RADIOGRAPHIC STUDIES: Ct Chest W Contrast  06/26/2012  *RADIOLOGY REPORT*  Clinical Data:  Follow-up recurrent non-small cell lung carcinoma. Completed radiation therapy and chemotherapy.  CT CHEST, ABDOMEN AND PELVIS WITH CONTRAST  Technique:  Multidetector CT imaging of the chest, abdomen and pelvis was performed following the standard protocol during bolus administration of  intravenous contrast.  Contrast: OMNIPAQUE IOHEXOL 300 MG/ML  SOLN  Comparison:  Chest CT on 04/01/2012 and abdomen pelvis CT on 02/06/2012   CT CHEST  Findings:  Postop changes from partial left lung resection remains stable.  Paramediastinal radiation changes in the right lung are also stable.  Pulmonary emphysema again noted.  Previously seen small bilateral pulmonary nodules in the medial right upper lobe and subpleural left upper lobe are no longer visualized.  No new or enlarging pulmonary nodules masses are identified.  No evidence of acute infiltrate or central endobronchial lesion. No evidence of pleural effusion.  Small pericardial effusion or thickening remains stable.  Right paratracheal mediastinal lymphadenopathy is significantly decreased since previous study, currently measuring 1.2 cm in short axis on image 19 compared 1.9 cm previously.  Prevascular mediastinal lymph node measures 13 mm in short axis on image 15 and is stable. No new or increased adenopathy is seen in the mediastinum, hilar regions, or elsewhere within the thorax.  Increased and new small sclerotic bone lesions are seen within the lower thoracic spine and right scapula which are consistent with sclerotic bone metastases.  IMPRESSION:  1.  Decreased mediastinal lymphadenopathy and small bilateral pulmonary nodules. 2.  Increased sclerotic bone metastases.  This may  be due to interval healing of treated bone metastases rather than progression of metastatic disease; correlation with treatment history is suggested.   CT ABDOMEN AND PELVIS  Findings:  The liver, gallbladder, adrenal glands, pancreas, kidneys, and spleen are normal in appearance.  No evidence of hydronephrosis.  No soft tissue masses or lymphadenopathy identified within the abdomen or pelvis. Tiny calcified uterine fibroids are stable.  The adnexal regions are unremarkable.  No evidence of inflammatory process or abnormal fluid collections. Colonic diverticulosis is again demonstrated, without evidence of diverticulitis.  Increased in number and size of sclerotic bone lesions is seen throughout the left hemi pelvis, sacrum and lumbar spine, consistent with bone metastases.  IMPRESSION:  1.  Increased conspicuity of the sclerotic bone metastases in the pelvis in the lumbar spine.  This could be due to interval healing of treated bone metastases rather than progression of metastatic disease; correlation with treatment history is suggested. 2.  No evidence of soft tissue metastatic disease1 within the abdomen or pelvis. 3.  Diverticulosis.  No radiographic evidence of diverticulitis. 4.  Stable tiny calcified uterine fibroids.   Original Report Authenticated By: Myles Rosenthal, M.D.    ASSESSMENT/PLAN: This is a very pleasant 62 years old African American female with metastatic non-small cell lung cancer, adenocarcinoma status post 4 cycles of systemic chemotherapy with carboplatin and Alimta with partial response in the mediastinal lymphadenopathy and smaller bilateral nodules but continues to sclerotic bone metastasis most likely secondary to interval healing of the treated bone lesions. The patient was discussed with Dr. Arbutus Ped. She'll proceed with cycle #6 of her systemic chemotherapy with carboplatin and Alimta. She will continue with weekly labs consisting of a CBC differential and C. met. She'll followup with  Dr. Arbutus Ped in 3 weeks prior with a restaging CT scan of her chest, abdomen and pelvis with contrast to reevaluate her disease.   Janell Keeling E, PA-C   She was advised to call immediately if she has any concerning symptoms in the interval.  All questions were answered. The patient knows to call the clinic with any problems, questions or concerns. We can certainly see the patient much sooner if necessary.  I spent 20 minutes counseling the patient face to  face. The total time spent in the appointment was 30 minutes.

## 2012-08-17 ENCOUNTER — Other Ambulatory Visit (HOSPITAL_BASED_OUTPATIENT_CLINIC_OR_DEPARTMENT_OTHER): Admitting: Lab

## 2012-08-17 DIAGNOSIS — C3430 Malignant neoplasm of lower lobe, unspecified bronchus or lung: Secondary | ICD-10-CM

## 2012-08-17 DIAGNOSIS — C3490 Malignant neoplasm of unspecified part of unspecified bronchus or lung: Secondary | ICD-10-CM

## 2012-08-17 LAB — CBC WITH DIFFERENTIAL/PLATELET
Basophils Absolute: 0 10*3/uL (ref 0.0–0.1)
Eosinophils Absolute: 0 10*3/uL (ref 0.0–0.5)
HCT: 28.2 % — ABNORMAL LOW (ref 34.8–46.6)
HGB: 9.6 g/dL — ABNORMAL LOW (ref 11.6–15.9)
LYMPH%: 49.6 % (ref 14.0–49.7)
MCH: 33.4 pg (ref 25.1–34.0)
MCV: 97.6 fL (ref 79.5–101.0)
MONO%: 16.2 % — ABNORMAL HIGH (ref 0.0–14.0)
NEUT#: 0.7 10*3/uL — ABNORMAL LOW (ref 1.5–6.5)
NEUT%: 33.2 % — ABNORMAL LOW (ref 38.4–76.8)
Platelets: 174 10*3/uL (ref 145–400)
RDW: 15.3 % — ABNORMAL HIGH (ref 11.2–14.5)

## 2012-08-17 LAB — COMPREHENSIVE METABOLIC PANEL (CC13)
Albumin: 3.2 g/dL — ABNORMAL LOW (ref 3.5–5.0)
Alkaline Phosphatase: 168 U/L — ABNORMAL HIGH (ref 40–150)
BUN: 19 mg/dL (ref 7.0–26.0)
Calcium: 9.5 mg/dL (ref 8.4–10.4)
Creatinine: 1.1 mg/dL (ref 0.6–1.1)
Glucose: 105 mg/dl — ABNORMAL HIGH (ref 70–99)
Potassium: 3.6 mEq/L (ref 3.5–5.1)

## 2012-08-19 ENCOUNTER — Other Ambulatory Visit: Payer: Self-pay | Admitting: Internal Medicine

## 2012-08-24 ENCOUNTER — Other Ambulatory Visit (HOSPITAL_BASED_OUTPATIENT_CLINIC_OR_DEPARTMENT_OTHER): Admitting: Lab

## 2012-08-24 DIAGNOSIS — C3490 Malignant neoplasm of unspecified part of unspecified bronchus or lung: Secondary | ICD-10-CM

## 2012-08-24 DIAGNOSIS — C3430 Malignant neoplasm of lower lobe, unspecified bronchus or lung: Secondary | ICD-10-CM

## 2012-08-24 LAB — CBC WITH DIFFERENTIAL/PLATELET
BASO%: 0 % (ref 0.0–2.0)
EOS%: 0.9 % (ref 0.0–7.0)
HCT: 27.2 % — ABNORMAL LOW (ref 34.8–46.6)
HGB: 9.2 g/dL — ABNORMAL LOW (ref 11.6–15.9)
MONO%: 12.9 % (ref 0.0–14.0)
NEUT#: 1.6 10*3/uL (ref 1.5–6.5)
NEUT%: 47.4 % (ref 38.4–76.8)
Platelets: 108 10*3/uL — ABNORMAL LOW (ref 145–400)
RBC: 2.82 10*6/uL — ABNORMAL LOW (ref 3.70–5.45)
RDW: 13.9 % (ref 11.2–14.5)
WBC: 3.4 10*3/uL — ABNORMAL LOW (ref 3.9–10.3)
lymph#: 1.3 10*3/uL (ref 0.9–3.3)

## 2012-08-24 LAB — COMPREHENSIVE METABOLIC PANEL (CC13)
AST: 35 U/L — ABNORMAL HIGH (ref 5–34)
Albumin: 3.4 g/dL — ABNORMAL LOW (ref 3.5–5.0)
Alkaline Phosphatase: 169 U/L — ABNORMAL HIGH (ref 40–150)
BUN: 9.7 mg/dL (ref 7.0–26.0)
Glucose: 105 mg/dl — ABNORMAL HIGH (ref 70–99)
Potassium: 3.6 mEq/L (ref 3.5–5.1)
Sodium: 140 mEq/L (ref 136–145)
Total Bilirubin: 0.22 mg/dL (ref 0.20–1.20)

## 2012-08-28 ENCOUNTER — Ambulatory Visit (HOSPITAL_COMMUNITY)
Admission: RE | Admit: 2012-08-28 | Discharge: 2012-08-28 | Disposition: A | Discharge status: Home / Self Care | Source: Ambulatory Visit | Attending: Physician Assistant | Admitting: Physician Assistant

## 2012-08-28 DIAGNOSIS — K5730 Diverticulosis of large intestine without perforation or abscess without bleeding: Secondary | ICD-10-CM | POA: Insufficient documentation

## 2012-08-28 DIAGNOSIS — Z923 Personal history of irradiation: Secondary | ICD-10-CM | POA: Insufficient documentation

## 2012-08-28 DIAGNOSIS — N83339 Acquired atrophy of ovary and fallopian tube, unspecified side: Secondary | ICD-10-CM | POA: Insufficient documentation

## 2012-08-28 DIAGNOSIS — N859 Noninflammatory disorder of uterus, unspecified: Secondary | ICD-10-CM | POA: Insufficient documentation

## 2012-08-28 DIAGNOSIS — C7951 Secondary malignant neoplasm of bone: Secondary | ICD-10-CM | POA: Insufficient documentation

## 2012-08-28 DIAGNOSIS — I7090 Unspecified atherosclerosis: Secondary | ICD-10-CM | POA: Insufficient documentation

## 2012-08-28 DIAGNOSIS — C7952 Secondary malignant neoplasm of bone marrow: Secondary | ICD-10-CM | POA: Insufficient documentation

## 2012-08-28 DIAGNOSIS — C3490 Malignant neoplasm of unspecified part of unspecified bronchus or lung: Secondary | ICD-10-CM | POA: Insufficient documentation

## 2012-08-28 DIAGNOSIS — I700 Atherosclerosis of aorta: Secondary | ICD-10-CM | POA: Insufficient documentation

## 2012-08-28 DIAGNOSIS — R599 Enlarged lymph nodes, unspecified: Secondary | ICD-10-CM | POA: Insufficient documentation

## 2012-08-28 DIAGNOSIS — Z9221 Personal history of antineoplastic chemotherapy: Secondary | ICD-10-CM | POA: Insufficient documentation

## 2012-08-28 DIAGNOSIS — I2510 Atherosclerotic heart disease of native coronary artery without angina pectoris: Secondary | ICD-10-CM | POA: Insufficient documentation

## 2012-08-28 MED ORDER — IOHEXOL 300 MG/ML  SOLN
100.0000 mL | Freq: Once | INTRAMUSCULAR | Status: AC | PRN
  Administered 2012-08-28: 100 mL via INTRAVENOUS

## 2012-08-31 ENCOUNTER — Other Ambulatory Visit

## 2012-09-01 ENCOUNTER — Other Ambulatory Visit (HOSPITAL_BASED_OUTPATIENT_CLINIC_OR_DEPARTMENT_OTHER): Admitting: Lab

## 2012-09-01 ENCOUNTER — Telehealth: Payer: Self-pay | Admitting: Internal Medicine

## 2012-09-01 ENCOUNTER — Ambulatory Visit (HOSPITAL_BASED_OUTPATIENT_CLINIC_OR_DEPARTMENT_OTHER): Admitting: Internal Medicine

## 2012-09-01 ENCOUNTER — Encounter: Payer: Self-pay | Admitting: Internal Medicine

## 2012-09-01 ENCOUNTER — Ambulatory Visit

## 2012-09-01 DIAGNOSIS — C7952 Secondary malignant neoplasm of bone marrow: Secondary | ICD-10-CM

## 2012-09-01 DIAGNOSIS — C3490 Malignant neoplasm of unspecified part of unspecified bronchus or lung: Secondary | ICD-10-CM

## 2012-09-01 DIAGNOSIS — C3430 Malignant neoplasm of lower lobe, unspecified bronchus or lung: Secondary | ICD-10-CM

## 2012-09-01 DIAGNOSIS — R599 Enlarged lymph nodes, unspecified: Secondary | ICD-10-CM

## 2012-09-01 LAB — CBC WITH DIFFERENTIAL/PLATELET
Basophils Absolute: 0 10*3/uL (ref 0.0–0.1)
EOS%: 1 % (ref 0.0–7.0)
Eosinophils Absolute: 0 10*3/uL (ref 0.0–0.5)
HGB: 9.3 g/dL — ABNORMAL LOW (ref 11.6–15.9)
LYMPH%: 44.1 % (ref 14.0–49.7)
MCH: 32.7 pg (ref 25.1–34.0)
MCV: 97.2 fL (ref 79.5–101.0)
MONO%: 17.6 % — ABNORMAL HIGH (ref 0.0–14.0)
NEUT#: 1.1 10*3/uL — ABNORMAL LOW (ref 1.5–6.5)
NEUT%: 37 % — ABNORMAL LOW (ref 38.4–76.8)
Platelets: 236 10*3/uL (ref 145–400)
RDW: 14 % (ref 11.2–14.5)

## 2012-09-01 NOTE — Patient Instructions (Signed)
No significant evidence for disease progression on his recent scan. We discussed maintenance chemotherapy with single agent Alimta and first cycle is expected on 10/07/2012. Followup visit at that time

## 2012-09-01 NOTE — Progress Notes (Signed)
[facility] Telephone:(336) [phone]   Fax:([phone]  OFFICE PROGRESS NOTE  Kaitlin Rodriguez, MD [address] [place] [address]  DIAGNOSIS: Recurrent non-small cell lung cancer initially diagnosed as stage IIA (T1 M1 MX) adenocarcinoma in April 2011.   PRIOR THERAPY:  1. Status post 3 cycles of neoadjuvant chemotherapy with carboplatin and Alimta. Last dose was given July 23, 2009. 2. Status post left lower lobectomy with lymph node dissection under the care of Dr. Edwyna Shell on October 17, 2009. 3. Status post concurrent chemoradiation with weekly carboplatin and paclitaxel for disease recurrence. Last dose was given May 07, 2010. 4. Status post 3 cycles of consolidation chemotherapy with carboplatin and Alimta. Last dose was given August 13, 2010. 5. Status post palliative radiotherapy to the right neck area. 6. Systemic chemotherapy with single agent Taxotere 75 mg/m2 every 3 weeks,, status post 6 cycles, last dose was given on 09/03/2011. 7. Systemic chemotherapy with carboplatin for an AUC of 5 and Alimta at 500 mg per meter square given every 3 weeks, status post 6 cycles.  CURRENT THERAPY: Maintenance chemotherapy with single agent Alimta 500 mg/M2 every 3 weeks, for his daughter is expected on 10/07/2012.   INTERVAL HISTORY: Kaitlin Reed 62 y.o. female returns to the clinic today for followup visit. The patient tolerated the last cycle of her systemic chemotherapy fairly well with no significant adverse effects. She denied having any significant nausea or vomiting, no fever or chills. She denied having any significant chest pain, shortness breath, cough or hemoptysis. She had repeat CT scan of the chest, abdomen and pelvis performed recently and she is here for evaluation and discussion of her scan results. The patient is scheduled for dental procedure in the next 2 weeks and she would like to take time off for this procedure and recovery.  MEDICAL  HISTORY: Past Medical History  Diagnosis Date  . Heart murmur   . Tubal pregnancy   . lung ca dx'd 06/2009    chemo comp 07/2010; xrt comp 06/2010  . Hypercholesteremia     h/o    ALLERGIES:  is allergic to codeine; motrin; other; trazodone and nefazodone; banana; and tape.  MEDICATIONS:  Current Outpatient Prescriptions  Medication Sig Dispense Refill  . Ascorbic Acid (VITAMIN C) 1000 MG tablet Take 1,000 mg by mouth daily.      Marland Kitchen aspirin EC 81 MG tablet Take 81 mg by mouth daily.      . calcium carbonate (OS-CAL) 600 MG TABS Take 600 mg by mouth 2 (two) times daily with a meal.      . Cyanocobalamin (B-12 PO) Take 1 tablet by mouth daily.      Marland Kitchen dexamethasone (DECADRON) 4 MG tablet one tablet by mouth twice a day the day before, day of and day after the chemotherapy  40 tablet  1  . folic acid (FOLVITE) 1 MG tablet TAKE 1 TABLET EVERY DAY  30 tablet  3  . IRON COMBINATIONS PO Take 1 tablet by mouth daily.       Marland Kitchen LORazepam (ATIVAN) 0.5 MG tablet Take 1 tablet by mouth or sublingually every 8 hours as needed for nausea. May take one tablet at bedtime as needed for sleep  40 tablet  0  . metoprolol tartrate (LOPRESSOR) 25 MG tablet Take 25 mg by mouth 2 (two) times daily.      . Multiple Vitamin (MULITIVITAMIN WITH MINERALS) TABS Take 1 tablet by mouth daily.        Marland Kitchen  potassium chloride (MICRO-K) 10 MEQ CR capsule Take 5 mEq by mouth daily.       . prochlorperazine (COMPAZINE) 10 MG tablet TAKE 1 TABLET BY MOUTH EVERY 6 HOURS AS NEEDED.  30 tablet  1  . VITAMIN D, CHOLECALCIFEROL, PO Take 1 tablet by mouth daily.       Marland Kitchen VITAMIN E PO Take 1 tablet by mouth daily.      . [DISCONTINUED] Alum & Mag Hydroxide-Simeth (MAGIC MOUTHWASH W/LIDOCAINE) SOLN Take 10 mLs by mouth 4 (four) times daily as needed.  500 mL  5   No current facility-administered medications for this visit.    SURGICAL HISTORY:  Past Surgical History  Procedure Laterality Date  . Lung removal, partial  10/17/09     left. pt states they removed small piece of lung due to cancer  . 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   PHYSICAL EXAMINATION: General appearance: alert, cooperative and no distress Head: Normocephalic, without obvious abnormality, atraumatic Neck: no adenopathy Lymph nodes: Cervical, supraclavicular, and axillary nodes normal. Resp: clear to auscultation bilaterally Cardio: regular rate and rhythm, S1, S2 normal, no murmur, click, rub or gallop GI: soft, non-tender; bowel sounds normal; no masses,  no organomegaly Extremities: extremities normal, atraumatic, no cyanosis or edema Neurologic: Alert and oriented X 3, normal strength and tone. Normal symmetric reflexes. Normal coordination and gait  ECOG PERFORMANCE STATUS: 1 - Symptomatic but completely ambulatory  Blood pressure 133/63, pulse 103, temperature 97.4 F (36.3 C), temperature source Oral, resp. rate 18, height 5\' 6"  (1.676 m), weight 164 lb 8 oz (74.617 kg).  LABORATORY DATA: Lab Results  Component Value Date   WBC 2.9* 09/01/2012   HGB 9.3* 09/01/2012   HCT 27.6* 09/01/2012   MCV 97.2 09/01/2012   PLT 236 09/01/2012      Chemistry      Component Value Date/Time   NA 140 08/24/2012 1547   NA 138 08/10/2012 1458   NA 138 11/06/2011 1121   K 3.6 08/24/2012 1547   K 3.8 08/10/2012 1458   K 4.3 11/06/2011 1121   CL 104 08/24/2012 1547   CL 100 08/10/2012 1458   CL 98 11/06/2011 1121   CO2 28 08/24/2012 1547   CO2 26 08/10/2012 1458   CO2 30 11/06/2011 1121   BUN 9.7 08/24/2012 1547   BUN 16 08/10/2012 1458   BUN 15 11/06/2011 1121   CREATININE 1.0 08/24/2012 1547   CREATININE 0.93 08/10/2012 1458   CREATININE 0.9 11/06/2011 1121      Component Value Date/Time   CALCIUM 9.6 08/24/2012 1547   CALCIUM 9.9 08/10/2012 1458   CALCIUM 9.0 11/06/2011 1121   ALKPHOS 169* 08/24/2012 1547   ALKPHOS 171* 08/10/2012 1458   ALKPHOS 110* 11/06/2011 1121   AST  35* 08/24/2012 1547   AST 26 08/10/2012 1458   AST 36 11/06/2011 1121   ALT 13 08/24/2012 1547   ALT 12 08/10/2012 1458   BILITOT 0.22 08/24/2012 1547   BILITOT 0.3 08/10/2012 1458   BILITOT 0.50 11/06/2011 1121       RADIOGRAPHIC STUDIES: Ct Chest W Contrast  08/28/2012  *RADIOLOGY REPORT*  Clinical Data:  Non-small cell lung cancer status post chemotherapy and radiation therapy.  Restaging scan.  CT CHEST, ABDOMEN AND PELVIS WITH CONTRAST  Technique:  Multidetector CT imaging of the chest, abdomen and pelvis was performed following  the standard protocol during bolus administration of intravenous contrast.  Contrast: OMNIPAQUE IOHEXOL 300 MG/ML  SOLN  Comparison:  CT of the chest abdomen and pelvis 06/26/2012.   CT CHEST  Findings:  Mediastinum: Heart size is normal. Small amount of pericardial fluid and/or pericardial thickening, unlikely to be of hemodynamic significance at this time, slightly increased compared to the prior study.  No associated pericardial calcification. No pathologically enlarged mediastinal or hilar lymph nodes. Esophagus is unremarkable in appearance. There is atherosclerosis of the thoracic aorta, the great vessels of the mediastinum and the coronary arteries, including calcified atherosclerotic plaque in the left main, left anterior descending and right coronary arteries.  Lungs/Pleura: Postoperative changes of left lower lobectomy are again noted.  There is chronic volume loss and architectural distortion in the medial aspect of the right upper lobe and in the medial and posterior aspect of the left upper lobe, likely represents chronic postradiation changes.  No definite suspicious appearing pulmonary nodule or mass is identified at this time.  No acute consolidative airspace disease.  No pleural effusions.  Musculoskeletal: There are several small nonspecific sclerotic lesions noted throughout the visualized axial and appendicular skeleton.  The largest of these is in the  left side of the T11 vertebral body, and is unchanged compared to the recent prior examinations, but new compared to more remote prior studies, likely reflective of treated bony metastases.  No new bony lesions are confidently identified.  IMPRESSION:  1.  Postoperative and postradiation changes redemonstrated in the thorax, as above, without definitive findings to suggest local recurrence of disease. 2.  Borderline enlarged anterior mediastinal lymph node appears decreased in size compared to the prior study. 3.  Multiple small sclerotic lesions in the visualized skeleton and are unchanged and favored to represent treated metastasis.  4.  Atherosclerosis, including left main and two-vessel coronary artery disease.   Assessment for potential risk factor modification, dietary therapy or pharmacologic therapy may be warranted, if clinically indicated. 5.  Small amount of pericardial fluid and/or thickening, slightly increased compared to prior studies, but unlikely to be of any hemodynamic significance at this time.   CT ABDOMEN AND PELVIS  Findings:  Abdomen/Pelvis: A small perfusion anomaly adjacent to the falciform ligament is unchanged and benign.  No focal hepatic lesions.  The appearance of the gallbladder, pancreas, spleen, bilateral adrenal glands and the right kidney is unremarkable.  The lower pole of the left kidney there is a 9 mm low attenuation lesion which is too small to definitively characterize, but is similar to the prior examination and likely to represent a small cyst.  Normal appendix. Atherosclerosis throughout the abdominal and pelvic vasculature, without definite aneurysm or dissection.  No significant volume of ascites.  No pneumoperitoneum.  No pathologic distension of small bowel.  Numerous colonic diverticula are noted, without surrounding inflammatory changes to suggest acute diverticulitis at this time. No pathologically enlarged lymph nodes are identified within the abdomen or pelvis.   Uterus is heterogeneous in appearance with multifocal calcifications, likely to reflect the presence of multiple fibroids.  Ovaries are atrophic and otherwise unremarkable.  Urinary bladder is normal in appearance.  Musculoskeletal: Multiple sclerotic lesions are again noted throughout the visualized axial and appendicular skeleton.  The largest burden of bone lesions is in the left ilium, particularly immediately lateral to the left sacroiliac joint.  The overall size and number of these lesions appears similar to prior studies, likely represent treated bony metastases.  IMPRESSION:  1.  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 2.  No definite signs of metastatic disease in the abdomen or pelvis. 3.  Colonic diverticulosis without evidence of acute diverticulitis at this time. 4.  Extensive atherosclerosis. 5.  Additional incidental findings, as above.   Original Report Authenticated By: Trudie Reed, M.D.    ASSESSMENT: This is a very pleasant 62 years old African American female with recurrent non-small cell lung cancer most recently treated with 6 cycles of systemic chemotherapy with carboplatin and Alimta and tolerated it fairly well with decrease in the mediastinal lymphadenopathy and stable disease the osseous metastasis.    PLAN: I discussed the scan results with the patient today. I recommended for her to continue on maintenance chemotherapy with single agent Alimta 500 mg/M2 every 3 weeks. I discussed with the patient adverse effect of this treatment including but not limited to mild alopecia, myelosuppression, nausea and vomiting, peripheral neuropathy, liver or in dysfunction. The patient agreed to proceed with treatment as planned. She would like to delay the start of this treatment until she completes the dental procedure which is as scheduled in around 2 weeks. I will arrange for the patient to receive the first cycle of this treatment on  10/07/2012. Should be also considered for treatment with Xgeva once her dental clearance is obtained. The patient would come back for followup visit with the start of the first cycle of her maintenance treatment. She was advised to call immediately if she has any concerning symptoms and interval.  All questions were answered. The patient knows to call the clinic with any problems, questions or concerns. We can certainly see the patient much sooner if necessary.

## 2012-09-01 NOTE — Telephone Encounter (Signed)
gv and printed appt sched and avs for pt...emailed MB to add tx.   °

## 2012-09-07 ENCOUNTER — Other Ambulatory Visit: Admitting: Lab

## 2012-09-10 ENCOUNTER — Telehealth: Payer: Self-pay | Admitting: Medical Oncology

## 2012-09-10 NOTE — Telephone Encounter (Signed)
Dr Monia Pouch requests cbc x 3 months and I faxed results to his office.

## 2012-09-11 ENCOUNTER — Telehealth: Payer: Self-pay | Admitting: *Deleted

## 2012-09-11 NOTE — Telephone Encounter (Signed)
CBC drawn 09/08/12 by Dr Monia Pouch given to Dr Donnald Garre to review.  SLJ

## 2012-09-11 NOTE — Telephone Encounter (Signed)
Dr Monia Pouch lab work is from [facility], implant and facial cosmetic surgery

## 2012-09-14 ENCOUNTER — Other Ambulatory Visit: Admitting: Lab

## 2012-09-22 ENCOUNTER — Other Ambulatory Visit: Admitting: Lab

## 2012-10-07 ENCOUNTER — Ambulatory Visit (HOSPITAL_BASED_OUTPATIENT_CLINIC_OR_DEPARTMENT_OTHER): Admitting: Internal Medicine

## 2012-10-07 ENCOUNTER — Telehealth: Payer: Self-pay | Admitting: Internal Medicine

## 2012-10-07 ENCOUNTER — Other Ambulatory Visit (HOSPITAL_BASED_OUTPATIENT_CLINIC_OR_DEPARTMENT_OTHER): Admitting: Lab

## 2012-10-07 ENCOUNTER — Encounter: Payer: Self-pay | Admitting: Internal Medicine

## 2012-10-07 ENCOUNTER — Ambulatory Visit (HOSPITAL_BASED_OUTPATIENT_CLINIC_OR_DEPARTMENT_OTHER)

## 2012-10-07 VITALS — BP 117/54 | HR 86 | Temp 97.6°F | Resp 18 | Ht 66.0 in | Wt 161.6 lb

## 2012-10-07 DIAGNOSIS — C3430 Malignant neoplasm of lower lobe, unspecified bronchus or lung: Secondary | ICD-10-CM

## 2012-10-07 DIAGNOSIS — C7951 Secondary malignant neoplasm of bone: Secondary | ICD-10-CM

## 2012-10-07 DIAGNOSIS — C770 Secondary and unspecified malignant neoplasm of lymph nodes of head, face and neck: Secondary | ICD-10-CM

## 2012-10-07 DIAGNOSIS — C7952 Secondary malignant neoplasm of bone marrow: Secondary | ICD-10-CM

## 2012-10-07 DIAGNOSIS — C3491 Malignant neoplasm of unspecified part of right bronchus or lung: Secondary | ICD-10-CM

## 2012-10-07 DIAGNOSIS — Z5111 Encounter for antineoplastic chemotherapy: Secondary | ICD-10-CM

## 2012-10-07 DIAGNOSIS — C3490 Malignant neoplasm of unspecified part of unspecified bronchus or lung: Secondary | ICD-10-CM

## 2012-10-07 LAB — CBC WITH DIFFERENTIAL/PLATELET
BASO%: 0.2 % (ref 0.0–2.0)
EOS%: 1.1 % (ref 0.0–7.0)
LYMPH%: 33 % (ref 14.0–49.7)
MCHC: 33.9 g/dL (ref 31.5–36.0)
MCV: 95.2 fL (ref 79.5–101.0)
MONO%: 9.9 % (ref 0.0–14.0)
Platelets: 277 10*3/uL (ref 145–400)
RBC: 3.56 10*6/uL — ABNORMAL LOW (ref 3.70–5.45)

## 2012-10-07 LAB — COMPREHENSIVE METABOLIC PANEL (CC13)
ALT: 14 U/L (ref 0–55)
Alkaline Phosphatase: 174 U/L — ABNORMAL HIGH (ref 40–150)
Sodium: 141 mEq/L (ref 136–145)
Total Bilirubin: 0.26 mg/dL (ref 0.20–1.20)
Total Protein: 7.7 g/dL (ref 6.4–8.3)

## 2012-10-07 MED ORDER — SODIUM CHLORIDE 0.9 % IV SOLN
Freq: Once | INTRAVENOUS | Status: AC
  Administered 2012-10-07: 16:00:00 via INTRAVENOUS

## 2012-10-07 MED ORDER — DEXAMETHASONE SODIUM PHOSPHATE 10 MG/ML IJ SOLN
10.0000 mg | Freq: Once | INTRAMUSCULAR | Status: AC
  Administered 2012-10-07: 10 mg via INTRAVENOUS

## 2012-10-07 MED ORDER — SODIUM CHLORIDE 0.9 % IJ SOLN
10.0000 mL | INTRAMUSCULAR | Status: DC | PRN
  Filled 2012-10-07: qty 10

## 2012-10-07 MED ORDER — HEPARIN SOD (PORK) LOCK FLUSH 100 UNIT/ML IV SOLN
500.0000 [IU] | Freq: Once | INTRAVENOUS | Status: DC | PRN
  Filled 2012-10-07: qty 5

## 2012-10-07 MED ORDER — SODIUM CHLORIDE 0.9 % IV SOLN
500.0000 mg/m2 | Freq: Once | INTRAVENOUS | Status: AC
  Administered 2012-10-07: 925 mg via INTRAVENOUS
  Filled 2012-10-07: qty 37

## 2012-10-07 MED ORDER — ONDANSETRON 8 MG/50ML IVPB (CHCC)
8.0000 mg | Freq: Once | INTRAVENOUS | Status: AC
  Administered 2012-10-07: 8 mg via INTRAVENOUS

## 2012-10-07 NOTE — Patient Instructions (Addendum)
[facility] Discharge Instructions for Patients Receiving Chemotherapy  Today you received the following chemotherapy agents : Alimpta  To help prevent nausea and vomiting after your treatment, we encourage you to take your nausea medication ,compazine, as directed. If you develop nausea and vomiting that is not controlled by your nausea medication, call the clinic.   BELOW ARE SYMPTOMS THAT SHOULD BE REPORTED IMMEDIATELY:  *FEVER GREATER THAN 100.5 F  *CHILLS WITH OR WITHOUT FEVER  NAUSEA AND VOMITING THAT IS NOT CONTROLLED WITH YOUR NAUSEA MEDICATION  *UNUSUAL SHORTNESS OF BREATH  *UNUSUAL BRUISING OR BLEEDING  TENDERNESS IN MOUTH AND THROAT WITH OR WITHOUT PRESENCE OF ULCERS  *URINARY PROBLEMS  *BOWEL PROBLEMS  UNUSUAL RASH Items with * indicate a potential emergency and should be followed up as soon as possible.  Feel free to call the clinic you have any questions or concerns. The clinic phone number is [phone].

## 2012-10-07 NOTE — Progress Notes (Signed)
[facility] Telephone:(336) [phone]   Fax:(336) [phone]  OFFICE PROGRESS NOTE  [name], MD [address] [place] [address]  DIAGNOSIS: Recurrent non-small cell lung cancer initially diagnosed as stage IIA (T1 M1 MX) adenocarcinoma in April 2011.   PRIOR THERAPY:  1. Status post 3 cycles of neoadjuvant chemotherapy with carboplatin and Alimta. Last dose was given July 23, 2009. 2. Status post left lower lobectomy with lymph node dissection under the care of Dr. Edwyna Shell on October 17, 2009. 3. Status post concurrent chemoradiation with weekly carboplatin and paclitaxel for disease recurrence. Last dose was given May 07, 2010. 4. Status post 3 cycles of consolidation chemotherapy with carboplatin and Alimta. Last dose was given August 13, 2010. 5. Status post palliative radiotherapy to the right neck area. 6. Systemic chemotherapy with single agent Taxotere 75 mg/m2 every 3 weeks,, status post 6 cycles, last dose was given on 09/03/2011. 7. Systemic chemotherapy with carboplatin for an AUC of 5 and Alimta at 500 mg per meter square given every 3 weeks, status post 6 cycles.  CURRENT THERAPY: Maintenance chemotherapy with single agent Alimta 500 mg/M2 every 3 weeks, first cycle was given on 10/07/2012.  INTERVAL HISTORY: Kaitlin Reed 62 y.o. female returns to the clinic today for followup visit. The patient is feeling fine today with no specific complaints. She denied having any significant chest pain, shortness breath, cough or hemoptysis. She underwent a dental surgery more than 2 weeks ago and is recovering fairly well. She lost few pounds after her dental procedure. She denied having any significant nausea or vomiting. She denied having any fever or chills. The patient is here today to start the maintenance chemotherapy with Alimta.  MEDICAL HISTORY: Past Medical History  Diagnosis Date  . Heart murmur   . Tubal pregnancy   . lung ca dx'd 06/2009      chemo comp 07/2010; xrt comp 06/2010  . Hypercholesteremia     h/o    ALLERGIES:  is allergic to codeine; motrin; other; trazodone and nefazodone; banana; and tape.  MEDICATIONS:  Current Outpatient Prescriptions  Medication Sig Dispense Refill  . Ascorbic Acid (VITAMIN C) 1000 MG tablet Take 1,000 mg by mouth daily.      Marland Kitchen aspirin EC 81 MG tablet Take 81 mg by mouth daily.      . calcium carbonate (OS-CAL) 600 MG TABS Take 600 mg by mouth 2 (two) times daily with a meal.      . Cyanocobalamin (B-12 PO) Take 1 tablet by mouth daily.      Marland Kitchen dexamethasone (DECADRON) 4 MG tablet one tablet by mouth twice a day the day before, day of and day after the chemotherapy  40 tablet  1  . folic acid (FOLVITE) 1 MG tablet TAKE 1 TABLET EVERY DAY  30 tablet  3  . IRON COMBINATIONS PO Take 1 tablet by mouth daily.       Marland Kitchen LORazepam (ATIVAN) 0.5 MG tablet Take 1 tablet by mouth or sublingually every 8 hours as needed for nausea. May take one tablet at bedtime as needed for sleep  40 tablet  0  . metoprolol tartrate (LOPRESSOR) 25 MG tablet Take 25 mg by mouth 2 (two) times daily.      . Multiple Vitamin (MULITIVITAMIN WITH MINERALS) TABS Take 1 tablet by mouth daily.        . potassium chloride (MICRO-K) 10 MEQ CR capsule Take 5 mEq by mouth daily.       Marland Kitchen  prochlorperazine (COMPAZINE) 10 MG tablet TAKE 1 TABLET BY MOUTH EVERY 6 HOURS AS NEEDED.  30 tablet  1  . VITAMIN E PO Take 1 tablet by mouth daily.      Marland Kitchen VITAMIN D, CHOLECALCIFEROL, PO Take 1 tablet by mouth daily.       . [DISCONTINUED] Alum & Mag Hydroxide-Simeth (MAGIC MOUTHWASH W/LIDOCAINE) SOLN Take 10 mLs by mouth 4 (four) times daily as needed.  500 mL  5   No current facility-administered medications for this visit.    SURGICAL HISTORY:  Past Surgical History  Procedure Laterality Date  . Lung removal, partial  10/17/09    left. pt states they removed small piece of lung due to cancer  . 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; for milk  tumor"    REVIEW OF SYSTEMS:  A comprehensive review of systems was negative.   PHYSICAL EXAMINATION: General appearance: alert, cooperative and no distress Head: Normocephalic, without obvious abnormality, atraumatic Neck: no adenopathy Lymph nodes: Cervical, supraclavicular, and axillary nodes normal. Resp: clear to auscultation bilaterally Cardio: regular rate and rhythm, S1, S2 normal, no murmur, click, rub or gallop GI: soft, non-tender; bowel sounds normal; no masses,  no organomegaly Extremities: extremities normal, atraumatic, no cyanosis or edema Neurologic: Alert and oriented X 3, normal strength and tone. Normal symmetric reflexes. Normal coordination and gait  ECOG PERFORMANCE STATUS: 0 - Asymptomatic  Blood pressure 117/54, pulse 86, temperature 97.6 F (36.4 C), temperature source Oral, resp. rate 18, height 5\' 6"  (1.676 m), weight 161 lb 9.6 oz (73.301 kg).  LABORATORY DATA: Lab Results  Component Value Date   WBC 4.5 10/07/2012   HGB 11.5* 10/07/2012   HCT 33.9* 10/07/2012   MCV 95.2 10/07/2012   PLT 277 10/07/2012      Chemistry      Component Value Date/Time   NA 140 08/24/2012 1547   NA 138 08/10/2012 1458   NA 138 11/06/2011 1121   K 3.6 08/24/2012 1547   K 3.8 08/10/2012 1458   K 4.3 11/06/2011 1121   CL 104 08/24/2012 1547   CL 100 08/10/2012 1458   CL 98 11/06/2011 1121   CO2 28 08/24/2012 1547   CO2 26 08/10/2012 1458   CO2 30 11/06/2011 1121   BUN 9.7 08/24/2012 1547   BUN 16 08/10/2012 1458   BUN 15 11/06/2011 1121   CREATININE 1.0 08/24/2012 1547   CREATININE 0.93 08/10/2012 1458   CREATININE 0.9 11/06/2011 1121      Component Value Date/Time   CALCIUM 9.6 08/24/2012 1547   CALCIUM 9.9 08/10/2012 1458   CALCIUM 9.0 11/06/2011 1121   ALKPHOS 169* 08/24/2012 1547   ALKPHOS 171* 08/10/2012 1458   ALKPHOS 110* 11/06/2011 1121   AST 35* 08/24/2012 1547   AST 26 08/10/2012 1458   AST 36 11/06/2011 1121   ALT 13 08/24/2012 1547   ALT 12 08/10/2012 1458   BILITOT 0.22  08/24/2012 1547   BILITOT 0.3 08/10/2012 1458   BILITOT 0.50 11/06/2011 1121       RADIOGRAPHIC STUDIES: No results found.  ASSESSMENT: This is a very pleasant 62 years old [place] American female with recurrent non-small cell lung cancer, adenocarcinoma most recently treated with 6 cycles of systemic chemotherapy with carboplatin and Alimta with stable disease.   PLAN: The patient is feeling fine today. We'll proceed with the first cycle of maintenance chemotherapy with single agent Alimta therapy. She would come back for followup visit in 3 weeks  with the start of cycle #2 of her chemotherapy.\ For bone disease, I would also consider the patient for treatment with Xgeva 120 mg subcutaneously every 4 weeks starting with the next cycle of her chemotherapy. The patient was advised to call me immediately if she has any concerning symptoms in the interval  All questions were answered. The patient knows to call the clinic with any problems, questions or concerns. We can certainly see the patient much sooner if necessary.  I spent 15 minutes counseling the patient face to face. The total time spent in the appointment was 25 minutes.

## 2012-10-07 NOTE — Telephone Encounter (Signed)
gv and printed appt sched and avs for pt  °

## 2012-10-07 NOTE — Patient Instructions (Signed)
Proceed with the first cycle of maintenance chemotherapy with Alimta today as scheduled. Followup visit in 3 weeks with the next cycle of chemotherapy.

## 2012-10-08 ENCOUNTER — Telehealth: Payer: Self-pay | Admitting: Medical Oncology

## 2012-10-08 NOTE — Telephone Encounter (Signed)
Kaitlin Reed called to report she did not take her decadron tabs the day before and the day of chemo , but she took them today. "Do I need to double up today?'.

## 2012-10-08 NOTE — Telephone Encounter (Signed)
I told pt not to double up on the decadron and to take her other nausea medicine if she needs it and to  call if she has any problems .

## 2012-10-27 ENCOUNTER — Other Ambulatory Visit: Payer: Self-pay | Admitting: Oncology

## 2012-10-28 ENCOUNTER — Ambulatory Visit

## 2012-10-28 ENCOUNTER — Other Ambulatory Visit: Payer: Self-pay | Admitting: Oncology

## 2012-10-28 ENCOUNTER — Other Ambulatory Visit: Payer: Self-pay | Admitting: Internal Medicine

## 2012-10-28 ENCOUNTER — Other Ambulatory Visit: Admitting: Lab

## 2012-10-28 ENCOUNTER — Other Ambulatory Visit (HOSPITAL_BASED_OUTPATIENT_CLINIC_OR_DEPARTMENT_OTHER): Admitting: Lab

## 2012-10-28 ENCOUNTER — Ambulatory Visit (HOSPITAL_BASED_OUTPATIENT_CLINIC_OR_DEPARTMENT_OTHER): Admitting: Physician Assistant

## 2012-10-28 ENCOUNTER — Encounter: Payer: Self-pay | Admitting: Physician Assistant

## 2012-10-28 ENCOUNTER — Telehealth: Payer: Self-pay | Admitting: Internal Medicine

## 2012-10-28 ENCOUNTER — Ambulatory Visit (HOSPITAL_BASED_OUTPATIENT_CLINIC_OR_DEPARTMENT_OTHER)

## 2012-10-28 DIAGNOSIS — C7952 Secondary malignant neoplasm of bone marrow: Secondary | ICD-10-CM

## 2012-10-28 DIAGNOSIS — C3491 Malignant neoplasm of unspecified part of right bronchus or lung: Secondary | ICD-10-CM

## 2012-10-28 DIAGNOSIS — C3430 Malignant neoplasm of lower lobe, unspecified bronchus or lung: Secondary | ICD-10-CM

## 2012-10-28 DIAGNOSIS — C770 Secondary and unspecified malignant neoplasm of lymph nodes of head, face and neck: Secondary | ICD-10-CM

## 2012-10-28 DIAGNOSIS — Z5111 Encounter for antineoplastic chemotherapy: Secondary | ICD-10-CM

## 2012-10-28 LAB — CBC WITH DIFFERENTIAL/PLATELET
Basophils Absolute: 0 10*3/uL (ref 0.0–0.1)
EOS%: 0 % (ref 0.0–7.0)
Eosinophils Absolute: 0 10*3/uL (ref 0.0–0.5)
HGB: 11.1 g/dL — ABNORMAL LOW (ref 11.6–15.9)
LYMPH%: 23.3 % (ref 14.0–49.7)
MCH: 32.3 pg (ref 25.1–34.0)
MCV: 93.9 fL (ref 79.5–101.0)
MONO%: 14.4 % — ABNORMAL HIGH (ref 0.0–14.0)
NEUT#: 4.9 10*3/uL (ref 1.5–6.5)
Platelets: 305 10*3/uL (ref 145–400)
RBC: 3.44 10*6/uL — ABNORMAL LOW (ref 3.70–5.45)
RDW: 13 % (ref 11.2–14.5)

## 2012-10-28 LAB — COMPREHENSIVE METABOLIC PANEL (CC13)
Alkaline Phosphatase: 190 U/L — ABNORMAL HIGH (ref 40–150)
CO2: 27 mEq/L (ref 22–29)
Creatinine: 1 mg/dL (ref 0.6–1.1)
Glucose: 94 mg/dl (ref 70–140)
Sodium: 141 mEq/L (ref 136–145)
Total Bilirubin: 0.21 mg/dL (ref 0.20–1.20)

## 2012-10-28 MED ORDER — SODIUM CHLORIDE 0.9 % IV SOLN
Freq: Once | INTRAVENOUS | Status: AC
  Administered 2012-10-28: 17:00:00 via INTRAVENOUS

## 2012-10-28 MED ORDER — ONDANSETRON 8 MG/50ML IVPB (CHCC)
8.0000 mg | Freq: Once | INTRAVENOUS | Status: AC
  Administered 2012-10-28: 8 mg via INTRAVENOUS

## 2012-10-28 MED ORDER — SODIUM CHLORIDE 0.9 % IV SOLN
500.0000 mg/m2 | Freq: Once | INTRAVENOUS | Status: AC
  Administered 2012-10-28: 925 mg via INTRAVENOUS
  Filled 2012-10-28: qty 37

## 2012-10-28 MED ORDER — DEXAMETHASONE SODIUM PHOSPHATE 10 MG/ML IJ SOLN
10.0000 mg | Freq: Once | INTRAMUSCULAR | Status: AC
  Administered 2012-10-28: 10 mg via INTRAVENOUS

## 2012-10-28 NOTE — Telephone Encounter (Signed)
Gave pt appt for lab , MD and chemo for July  and August 2014 °

## 2012-10-28 NOTE — Progress Notes (Addendum)
[facility] Telephone:(336) [phone]   [phone]) [phone]  SHARED VISIT PROGRESS NOTE  [name], MD [address] [place] [address]  DIAGNOSIS: Recurrent non-small cell lung cancer initially diagnosed as stage IIA (T1 M1 MX) adenocarcinoma in April 2011.   PRIOR THERAPY:  1. Status post 3 cycles of neoadjuvant chemotherapy with carboplatin and Alimta. Last dose was given July 23, 2009. 2. Status post left lower lobectomy with lymph node dissection under the care of Dr. Edwyna Shell on October 17, 2009. 3. Status post concurrent chemoradiation with weekly carboplatin and paclitaxel for disease recurrence. Last dose was given May 07, 2010. 4. Status post 3 cycles of consolidation chemotherapy with carboplatin and Alimta. Last dose was given August 13, 2010. 5. Status post palliative radiotherapy to the right neck area. 6. Systemic chemotherapy with single agent Taxotere 75 mg/m2 every 3 weeks,, status post 6 cycles, last dose was given on 09/03/2011. 7. Systemic chemotherapy with carboplatin for an AUC of 5 and Alimta at 500 mg per meter square given every 3 weeks, status post 6 cycles.  CURRENT THERAPY: Maintenance chemotherapy with single agent Alimta 500 mg/M2 every 3 weeks, status post 1 cycle  INTERVAL HISTORY: Kaitlin Reed 62 y.o. female returns to the clinic today for followup visit. The patient is feeling fine today with no specific complaints. She denied having any significant chest pain, shortness breath, cough or hemoptysis. She underwent  dental surgery more than 3 weeks ago and is recovering fairly well. She still has at least 2 more dental visits aware more work may have to be done. Her her weight has stabilized after having lost weight after the dental surgery.  She denied having any significant nausea or vomiting. She denied having any fever or chills. The patient is here today to proceed with her second cycle of maintenance chemotherapy with  Alimta.  MEDICAL HISTORY: Past Medical History  Diagnosis Date  . Heart murmur   . Tubal pregnancy   . lung ca dx'd 06/2009    chemo comp 07/2010; xrt comp 06/2010  . Hypercholesteremia     h/o    ALLERGIES:  is allergic to codeine; motrin; other; trazodone and nefazodone; banana; and tape.  MEDICATIONS:  Current Outpatient Prescriptions  Medication Sig Dispense Refill  . Ascorbic Acid (VITAMIN C) 1000 MG tablet Take 1,000 mg by mouth daily.      Marland Kitchen aspirin EC 81 MG tablet Take 81 mg by mouth daily.      . calcium carbonate (OS-CAL) 600 MG TABS Take 600 mg by mouth 2 (two) times daily with a meal.      . Cyanocobalamin (B-12 PO) Take 1 tablet by mouth daily.      Marland Kitchen dexamethasone (DECADRON) 4 MG tablet one tablet by mouth twice a day the day before, day of and day after the chemotherapy  40 tablet  1  . folic acid (FOLVITE) 1 MG tablet TAKE 1 TABLET EVERY DAY  30 tablet  3  . IRON COMBINATIONS PO Take 1 tablet by mouth daily.       Marland Kitchen LORazepam (ATIVAN) 0.5 MG tablet Take 1 tablet by mouth or sublingually every 8 hours as needed for nausea. May take one tablet at bedtime as needed for sleep  40 tablet  0  . metoprolol tartrate (LOPRESSOR) 25 MG tablet Take 25 mg by mouth 2 (two) times daily.      . Multiple Vitamin (MULITIVITAMIN WITH MINERALS) TABS Take 1  tablet by mouth daily.        . potassium chloride (MICRO-K) 10 MEQ CR capsule Take 5 mEq by mouth daily.       . prochlorperazine (COMPAZINE) 10 MG tablet TAKE 1 TABLET BY MOUTH EVERY 6 HOURS AS NEEDED.  30 tablet  1  . VITAMIN D, CHOLECALCIFEROL, PO Take 1 tablet by mouth daily.       Marland Kitchen VITAMIN E PO Take 1 tablet by mouth daily.      . [DISCONTINUED] Alum & Mag Hydroxide-Simeth (MAGIC MOUTHWASH W/LIDOCAINE) SOLN Take 10 mLs by mouth 4 (four) times daily as needed.  500 mL  5   No current facility-administered medications for this visit.    SURGICAL HISTORY:  Past Surgical History  Procedure Laterality Date  . Lung removal,  partial  10/17/09    left. pt states they removed small piece of lung due to cancer  . 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   PHYSICAL EXAMINATION: General appearance: alert, cooperative and no distress Head: Normocephalic, without obvious abnormality, atraumatic Neck: no adenopathy Lymph nodes: Cervical, supraclavicular, and axillary nodes normal. Resp: clear to auscultation bilaterally Cardio: regular rate and rhythm, S1, S2 normal, no murmur, click, rub or gallop GI: soft, non-tender; bowel sounds normal; no masses,  no organomegaly Extremities: extremities normal, atraumatic, no cyanosis or edema Neurologic: Alert and oriented X 3, normal strength and tone. Normal symmetric reflexes. Normal coordination and gait  ECOG PERFORMANCE STATUS: 0 - Asymptomatic  Blood pressure 132/54, pulse 94, temperature 97.6 F (36.4 C), temperature source Oral, resp. rate 18, height 5\' 6"  (1.676 m), weight 161 lb 14.4 oz (73.437 kg).  LABORATORY DATA: Lab Results  Component Value Date   WBC 7.9 10/28/2012   HGB 11.1* 10/28/2012   HCT 32.3* 10/28/2012   MCV 93.9 10/28/2012   PLT 305 10/28/2012      Chemistry      Component Value Date/Time   NA 141 10/28/2012 1425   NA 138 08/10/2012 1458   NA 138 11/06/2011 1121   K 3.2* 10/28/2012 1425   K 3.8 08/10/2012 1458   K 4.3 11/06/2011 1121   CL 105 10/07/2012 1350   CL 100 08/10/2012 1458   CL 98 11/06/2011 1121   CO2 27 10/28/2012 1425   CO2 26 08/10/2012 1458   CO2 30 11/06/2011 1121   BUN 14.7 10/28/2012 1425   BUN 16 08/10/2012 1458   BUN 15 11/06/2011 1121   CREATININE 1.0 10/28/2012 1425   CREATININE 0.93 08/10/2012 1458   CREATININE 0.9 11/06/2011 1121      Component Value Date/Time   CALCIUM 9.9 10/28/2012 1425   CALCIUM 9.9 08/10/2012 1458   CALCIUM 9.0 11/06/2011 1121   ALKPHOS 190* 10/28/2012 1425   ALKPHOS 171* 08/10/2012 1458   ALKPHOS 110* 11/06/2011 1121   AST 38*  10/28/2012 1425   AST 26 08/10/2012 1458   AST 36 11/06/2011 1121   ALT 24 10/28/2012 1425   ALT 12 08/10/2012 1458   BILITOT 0.21 10/28/2012 1425   BILITOT 0.3 08/10/2012 1458   BILITOT 0.50 11/06/2011 1121       RADIOGRAPHIC STUDIES: No results found.  ASSESSMENT: This is a very pleasant 62 years old [place] American female with recurrent non-small cell lung cancer, adenocarcinoma most recently treated with 6 cycles of systemic chemotherapy with carboplatin and Alimta with stable disease. She  is now status post one cycle of maintenance chemotherapy with single agent Alimta   PLAN: The patient is feeling fine today. She'll proceed with cycle #2 of her maintenance chemotherapy with single agent Alimta today as scheduled. We'll proceed with the first cycle of maintenance chemotherapy with single agent Alimta therapy. The patient was discussed with and also seen by Dr. Arbutus Ped. She will return in 3 weeks prior to cycle #3 of her maintenance chemotherapy with single agent Alimta with a repeat CBC differential and C. met. We will postpone the start of her treatment with Xgeva 120 mg subcutaneously every 4 weeks  for her bone disease until she has completed all of her dental work and it is well-healed.  Laural Benes, Jylan Loeza E, PA-C   The patient was advised to call  immediately if she has any concerning symptoms in the interval  All questions were answered. The patient knows to call the clinic with any problems, questions or concerns. We can certainly see the patient much sooner if necessary.  I spent 15 minutes counseling the patient face to face. The total time spent in the appointment was 25 minutes.  ADDENDUM: Hematology/oncology attending: I have face to face encounter with Ms. Guedea. I recommended her care plan. The patient has recurrent non-small cell lung cancer and currently on maintenance chemotherapy with single agent Alimta status post 1 cycle. She is tolerating her treatment fairly well with  no significant adverse effects. I recommended for the patient to proceed with cycle #2 today as scheduled. She would come back for followup visit in 3 weeks with the start of cycle #3. Patient was advised to call immediately she has any concerning symptoms in the interval. Lajuana Matte., MD 10/28/2012

## 2012-10-28 NOTE — Patient Instructions (Addendum)
Followup in 3 weeks prior to next scheduled cycle of maintenance Alimta Followup with your dentist as previously scheduled

## 2012-11-16 IMAGING — CT CT CHEST W/ CM
4 of 7 series · 17 of 36 positions shown, 19 images · IV contrast (APPLIED)
Comparison: 03/04/2011

CLINICAL DATA: Restaging lung cancer

CT CHEST WITH CONTRAST
TECHNIQUE: Multidetector CT imaging of the chest was performed
following the standard protocol during bolus administration of
intravenous contrast.
Contrast: 100mL OMNIPAQUE IOHEXOL 300 MG/ML IV SOLN 03/04/2011

[Series 2: chest st · axial · 0.66mm/px · z∈[-504,-278]mm · 6 of 64 slices shown, 8 images]
[im 10/64  mediastinal]
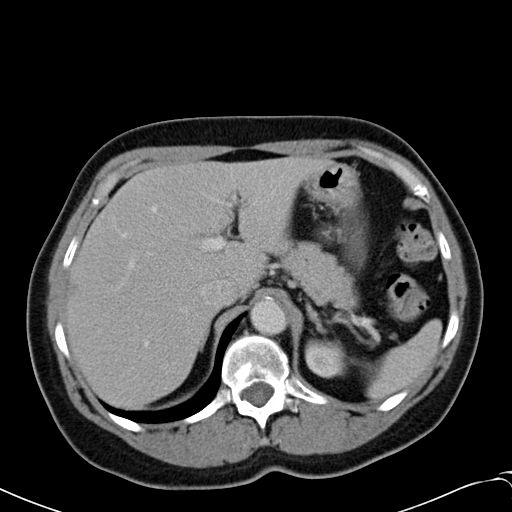
[im 10/64  lung]
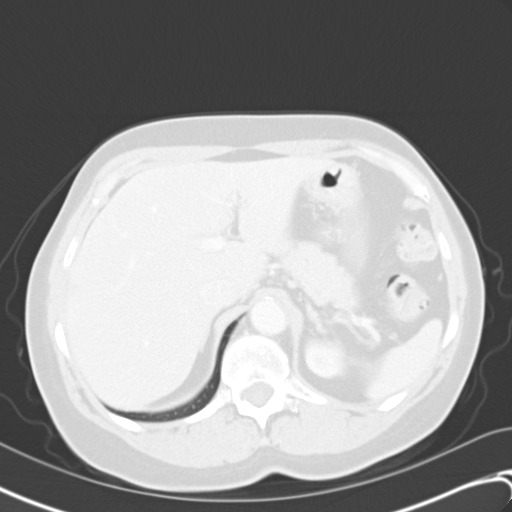
[im 19/64  lung]
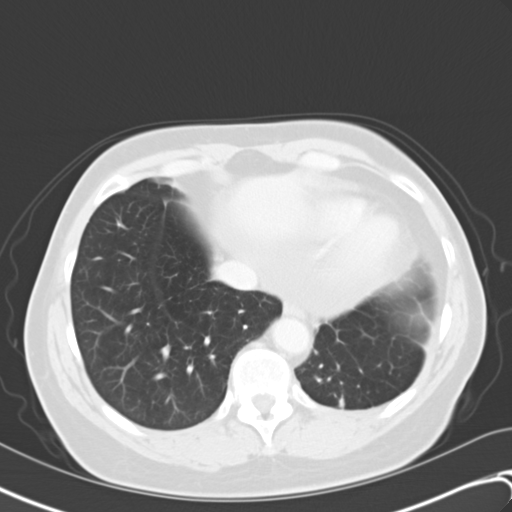
[im 28/64  lung]
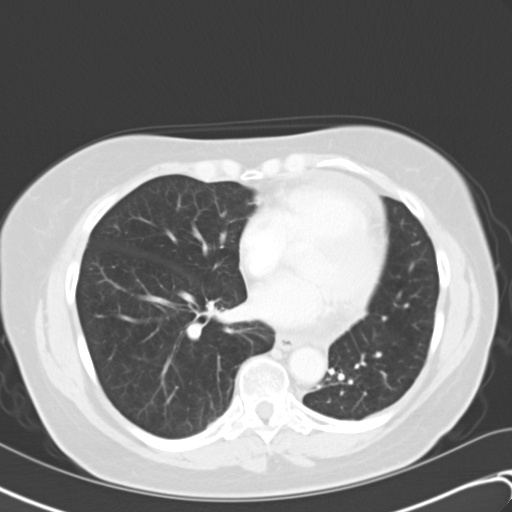
[im 37/64  lung]
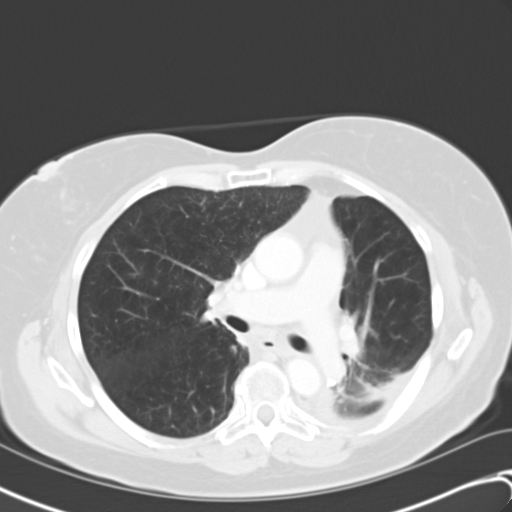
[im 46/64  mediastinal]
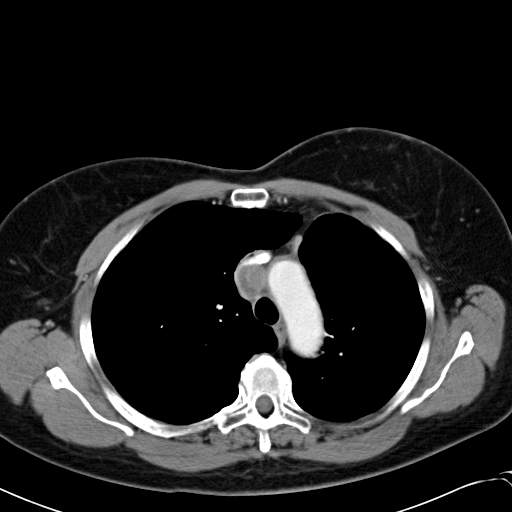
[im 46/64  lung]
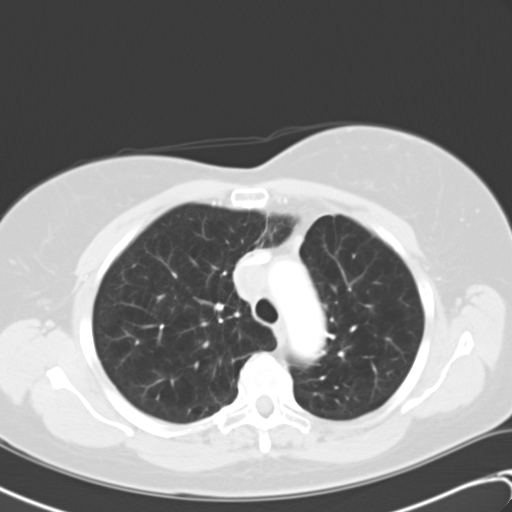
[im 55/64  lung]
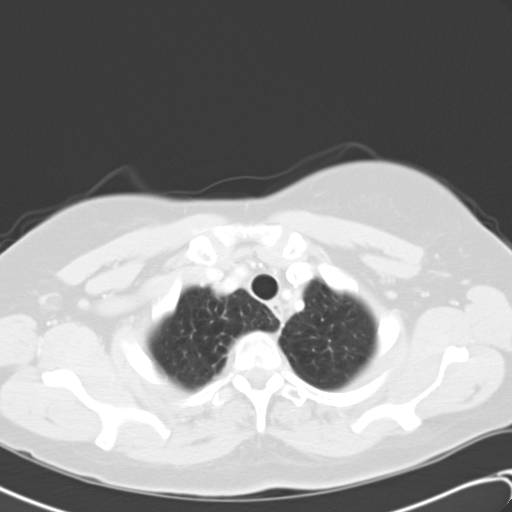

[Series 6: neck st · axial · 0.39mm/px · z∈[-304,-126]mm · 8 of 77 slices shown]
[im 9/77  lung]
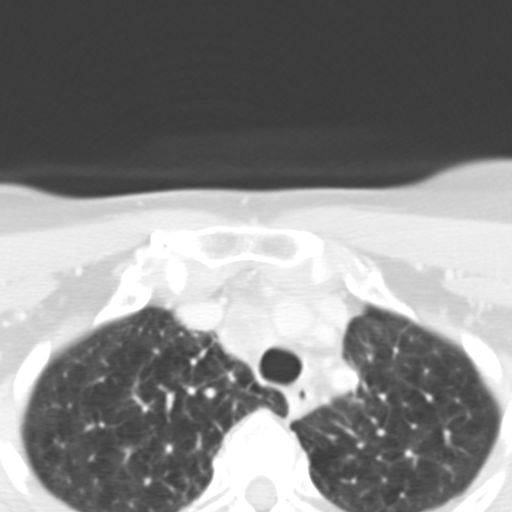
[im 17/77  lung]
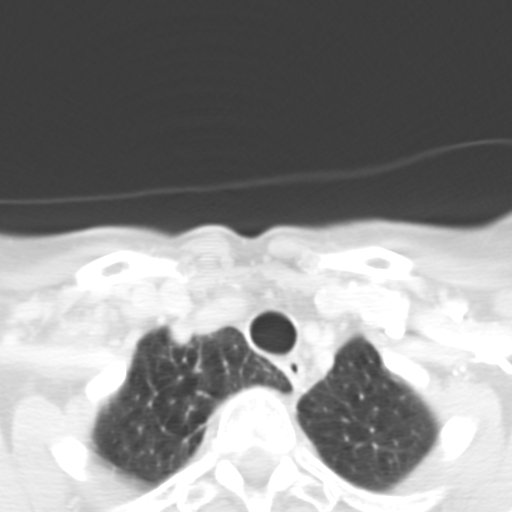
[im 26/77  lung]
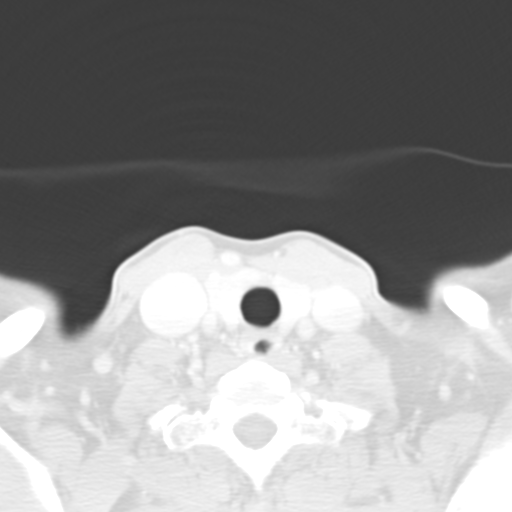
[im 34/77  lung]
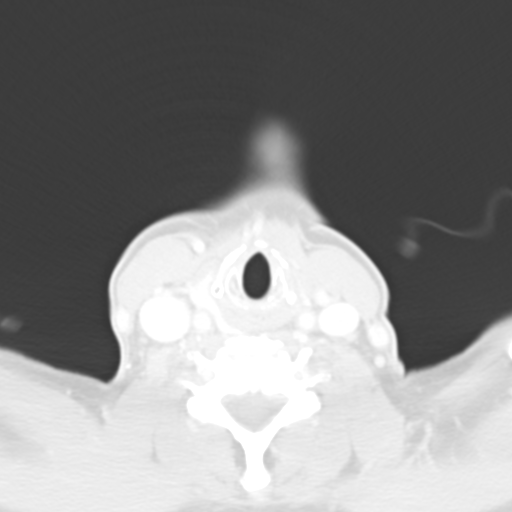
[im 43/77  lung]
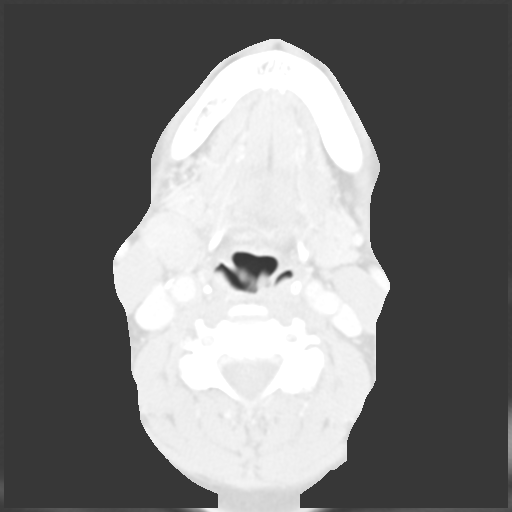
[im 51/77  lung]
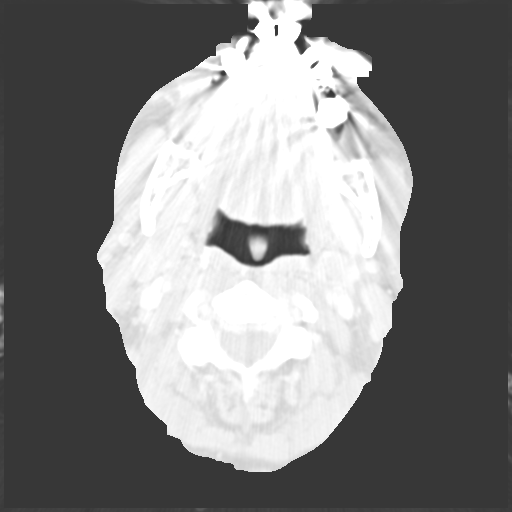
[im 60/77  lung]
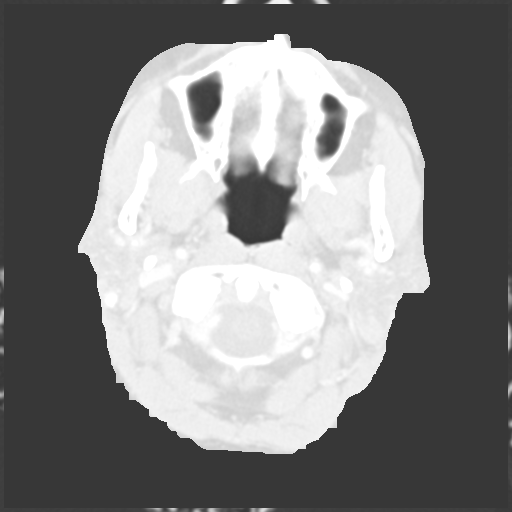
[im 68/77  lung]
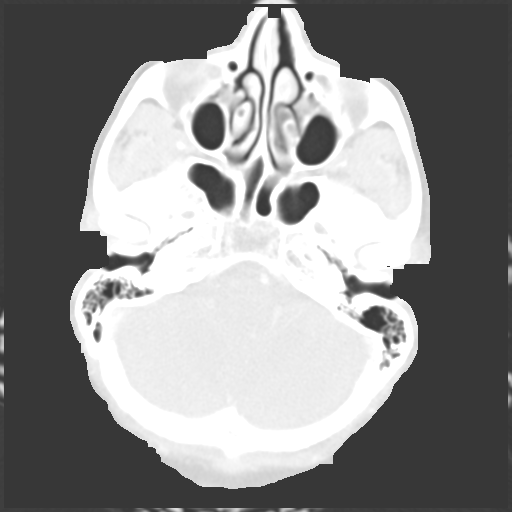

[Series 604: <mpr thick range(2)> · coronal · 0.45mm/px · 1 of 69 slices shown]
[im 35/69  lung]
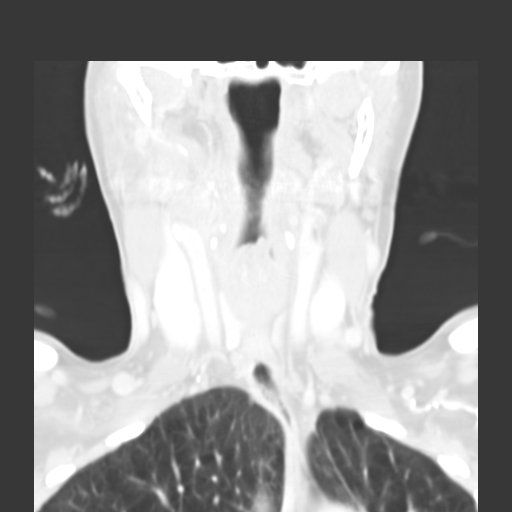

[Series 605: <mpr thick range(3)> · axial · 0.45mm/px · z∈[-376,-353]mm · 2 of 80 slices shown]
[im 9/80  lung]
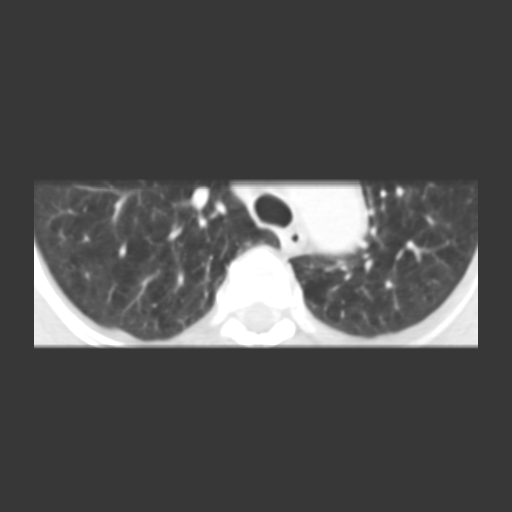
[im 18/80  lung]
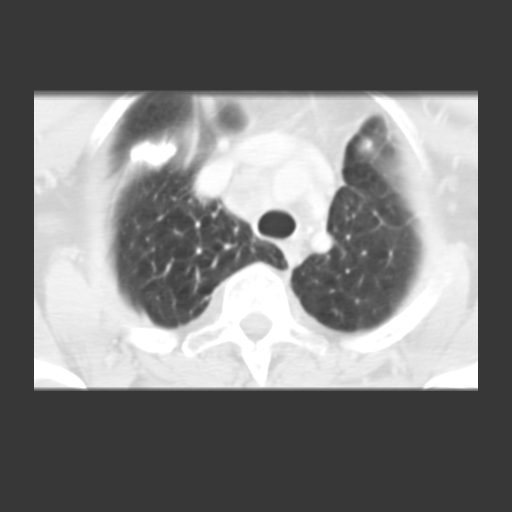

[17 of 36 positions shown; findings below may reference images not displayed]

FINDINGS: No enlarged axillary or supraclavicular adenopathy.

Right paratracheal lymph node measures 1.9 cm, image 18.
Previously 1.8 cm.  Precarinal lymph node measures 1.1 cm, image
23.  Previously 0.6 cm.

Similar sized pericardial effusion.  Similar appearance of
partially loculated left pleural effusion.

Post lung changes identified on the left.

Consolidation and fibrosis and scarring is noted within the left
midlung.

Previously referenced anterior medial right upper lobe nodule
measures 0.6 cm, image 23.  Previously 0.5 cm.

Advanced emphysema.

Review of the visualized osseous structures is significant for mild
multilevel spondylosis.

No worrisome lytic or sclerotic lesions identified.

Limited imaging through the upper abdomen is unremarkable.

The adrenal glands both appear normal.
IMPRESSION: 1.  Enlarged right paratracheal and precarinal lymph nodes
worrisome for residual/recurrence of tumor.
2.  Stable small pericardial effusion and postop changes involving
the left lung.

## 2012-11-18 ENCOUNTER — Ambulatory Visit (HOSPITAL_BASED_OUTPATIENT_CLINIC_OR_DEPARTMENT_OTHER): Admitting: Internal Medicine

## 2012-11-18 ENCOUNTER — Encounter: Payer: Self-pay | Admitting: Internal Medicine

## 2012-11-18 ENCOUNTER — Other Ambulatory Visit (HOSPITAL_BASED_OUTPATIENT_CLINIC_OR_DEPARTMENT_OTHER): Admitting: Lab

## 2012-11-18 ENCOUNTER — Telehealth: Payer: Self-pay | Admitting: Internal Medicine

## 2012-11-18 ENCOUNTER — Ambulatory Visit (HOSPITAL_BASED_OUTPATIENT_CLINIC_OR_DEPARTMENT_OTHER)

## 2012-11-18 VITALS — BP 130/77 | HR 98 | Temp 97.6°F | Resp 18 | Ht 66.0 in | Wt 163.2 lb

## 2012-11-18 DIAGNOSIS — C3430 Malignant neoplasm of lower lobe, unspecified bronchus or lung: Secondary | ICD-10-CM

## 2012-11-18 DIAGNOSIS — Z5111 Encounter for antineoplastic chemotherapy: Secondary | ICD-10-CM

## 2012-11-18 DIAGNOSIS — C3491 Malignant neoplasm of unspecified part of right bronchus or lung: Secondary | ICD-10-CM

## 2012-11-18 DIAGNOSIS — C3490 Malignant neoplasm of unspecified part of unspecified bronchus or lung: Secondary | ICD-10-CM

## 2012-11-18 LAB — CBC WITH DIFFERENTIAL/PLATELET
BASO%: 0 % (ref 0.0–2.0)
EOS%: 0 % (ref 0.0–7.0)
LYMPH%: 14.2 % (ref 14.0–49.7)
MCH: 31.3 pg (ref 25.1–34.0)
MCHC: 33.5 g/dL (ref 31.5–36.0)
MCV: 93.3 fL (ref 79.5–101.0)
MONO#: 1.2 10*3/uL — ABNORMAL HIGH (ref 0.1–0.9)
MONO%: 18.3 % — ABNORMAL HIGH (ref 0.0–14.0)
NEUT%: 67.5 % (ref 38.4–76.8)
Platelets: 349 10*3/uL (ref 145–400)
RBC: 3.45 10*6/uL — ABNORMAL LOW (ref 3.70–5.45)
WBC: 6.4 10*3/uL (ref 3.9–10.3)
nRBC: 0 % (ref 0–0)

## 2012-11-18 LAB — COMPREHENSIVE METABOLIC PANEL (CC13)
ALT: 27 U/L (ref 0–55)
AST: 43 U/L — ABNORMAL HIGH (ref 5–34)
BUN: 14.4 mg/dL (ref 7.0–26.0)
Creatinine: 1 mg/dL (ref 0.6–1.1)
Total Bilirubin: 0.21 mg/dL (ref 0.20–1.20)

## 2012-11-18 MED ORDER — CYANOCOBALAMIN 1000 MCG/ML IJ SOLN
1000.0000 ug | Freq: Once | INTRAMUSCULAR | Status: AC
  Administered 2012-11-18: 1000 ug via INTRAMUSCULAR

## 2012-11-18 MED ORDER — SODIUM CHLORIDE 0.9 % IV SOLN: Freq: Once | INTRAVENOUS | Status: DC

## 2012-11-18 MED ORDER — ONDANSETRON 8 MG/50ML IVPB (CHCC)
8.0000 mg | Freq: Once | INTRAVENOUS | Status: AC
  Administered 2012-11-18: 8 mg via INTRAVENOUS

## 2012-11-18 MED ORDER — PEMETREXED DISODIUM CHEMO INJECTION 500 MG
500.0000 mg/m2 | Freq: Once | INTRAVENOUS | Status: AC
  Administered 2012-11-18: 925 mg via INTRAVENOUS
  Filled 2012-11-18: qty 37

## 2012-11-18 MED ORDER — DEXAMETHASONE SODIUM PHOSPHATE 10 MG/ML IJ SOLN
10.0000 mg | Freq: Once | INTRAMUSCULAR | Status: AC
  Administered 2012-11-18: 10 mg via INTRAVENOUS

## 2012-11-18 NOTE — Patient Instructions (Addendum)
Cancer Center Discharge Instructions for Patients Receiving Chemotherapy  Today you received the following chemotherapy agents :  alimta  To help prevent nausea and vomiting after your treatment, we encourage you to take your nausea medication.  If you develop nausea and vomiting that is not controlled by your nausea medication, call the clinic.   BELOW ARE SYMPTOMS THAT SHOULD BE REPORTED IMMEDIATELY:  *FEVER GREATER THAN 100.5 F  *CHILLS WITH OR WITHOUT FEVER  NAUSEA AND VOMITING THAT IS NOT CONTROLLED WITH YOUR NAUSEA MEDICATION  *UNUSUAL SHORTNESS OF BREATH  *UNUSUAL BRUISING OR BLEEDING  TENDERNESS IN MOUTH AND THROAT WITH OR WITHOUT PRESENCE OF ULCERS  *URINARY PROBLEMS  *BOWEL PROBLEMS  UNUSUAL RASH Items with * indicate a potential emergency and should be followed up as soon as possible.  Feel free to call the clinic you have any questions or concerns. The clinic phone number is [phone].    

## 2012-11-18 NOTE — Progress Notes (Signed)
[facility] Telephone:([phone]   Fax:(336) [phone]  OFFICE PROGRESS NOTE  Kaitlin Rodriguez, MD [address] [place] [address]  DIAGNOSIS: Recurrent non-small cell lung cancer initially diagnosed as stage IIA (T1 M1 MX) adenocarcinoma in April 2011.   PRIOR THERAPY:  1. Status post 3 cycles of neoadjuvant chemotherapy with carboplatin and Alimta. Last dose was given July 23, 2009. 2. Status post left lower lobectomy with lymph node dissection under the care of Dr. Edwyna Shell on October 17, 2009. 3. Status post concurrent chemoradiation with weekly carboplatin and paclitaxel for disease recurrence. Last dose was given May 07, 2010. 4. Status post 3 cycles of consolidation chemotherapy with carboplatin and Alimta. Last dose was given August 13, 2010. 5. Status post palliative radiotherapy to the right neck area. 6. Systemic chemotherapy with single agent Taxotere 75 mg/m2 every 3 weeks,, status post 6 cycles, last dose was given on 09/03/2011. 7. Systemic chemotherapy with carboplatin for an AUC of 5 and Alimta at 500 mg per meter square given every 3 weeks, status post 6 cycles.  CURRENT THERAPY: Maintenance chemotherapy with single agent Alimta 500 mg/M2 every 3 weeks, status post 2 cycles  INTERVAL HISTORY: Kaitlin Reed 62 y.o. female returns to the clinic today for followup visit. The patient is tolerating her current treatment with maintenance Alimta fairly well with no significant adverse effects. She denied having any significant nausea or vomiting. She has no chest pain, shortness breath, cough or hemoptysis. She has no fever or chills. The patient is here today to start cycle #3 of her chemotherapy.  MEDICAL HISTORY: Past Medical History  Diagnosis Date  . Heart murmur   . Tubal pregnancy   . lung ca dx'd 06/2009    chemo comp 07/2010; xrt comp 06/2010  . Hypercholesteremia     h/o    ALLERGIES:  is allergic to codeine; motrin; other;  trazodone and nefazodone; banana; and tape.  MEDICATIONS:  Current Outpatient Prescriptions  Medication Sig Dispense Refill  . Ascorbic Acid (VITAMIN C) 1000 MG tablet Take 1,000 mg by mouth daily.      Marland Kitchen aspirin EC 81 MG tablet Take 81 mg by mouth daily.      . calcium carbonate (OS-CAL) 600 MG TABS Take 600 mg by mouth 2 (two) times daily with a meal.      . Cyanocobalamin (B-12 PO) Take 1 tablet by mouth daily.      Marland Kitchen dexamethasone (DECADRON) 4 MG tablet one tablet by mouth twice a day the day before, day of and day after the chemotherapy  40 tablet  1  . folic acid (FOLVITE) 1 MG tablet TAKE 1 TABLET EVERY DAY  30 tablet  3  . IRON COMBINATIONS PO Take 1 tablet by mouth daily.       Marland Kitchen LORazepam (ATIVAN) 0.5 MG tablet Take 1 tablet by mouth or sublingually every 8 hours as needed for nausea. May take one tablet at bedtime as needed for sleep  40 tablet  0  . metoprolol tartrate (LOPRESSOR) 25 MG tablet Take 25 mg by mouth 2 (two) times daily.      . Multiple Vitamin (MULITIVITAMIN WITH MINERALS) TABS Take 1 tablet by mouth daily.        . potassium chloride (MICRO-K) 10 MEQ CR capsule Take 5 mEq by mouth daily.       Marland Kitchen VITAMIN D, CHOLECALCIFEROL, PO Take 1 tablet by mouth daily.       Marland Kitchen  VITAMIN E PO Take 1 tablet by mouth daily.      . prochlorperazine (COMPAZINE) 10 MG tablet TAKE 1 TABLET BY MOUTH EVERY 6 HOURS AS NEEDED.  30 tablet  1  . [DISCONTINUED] Alum & Mag Hydroxide-Simeth (MAGIC MOUTHWASH W/LIDOCAINE) SOLN Take 10 mLs by mouth 4 (four) times daily as needed.  500 mL  5   No current facility-administered medications for this visit.    SURGICAL HISTORY:  Past Surgical History  Procedure Laterality Date  . Lung removal, partial  10/17/09    left. pt states they removed small piece of lung due to cancer  . 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   PHYSICAL EXAMINATION: General appearance:  alert, cooperative and no distress Head: Normocephalic, without obvious abnormality, atraumatic Neck: no adenopathy Lymph nodes: Cervical, supraclavicular, and axillary nodes normal. Resp: clear to auscultation bilaterally Cardio: regular rate and rhythm, S1, S2 normal, no murmur, click, rub or gallop GI: soft, non-tender; bowel sounds normal; no masses,  no organomegaly Extremities: extremities normal, atraumatic, no cyanosis or edema  ECOG PERFORMANCE STATUS: 1 - Symptomatic but completely ambulatory  Blood pressure 130/77, pulse 98, temperature 97.6 F (36.4 C), temperature source Oral, resp. rate 18, height 5\' 6"  (1.676 m), weight 163 lb 3.2 oz (74.027 kg).  LABORATORY DATA: Lab Results  Component Value Date   WBC 6.4 11/18/2012   HGB 10.8* 11/18/2012   HCT 32.2* 11/18/2012   MCV 93.3 11/18/2012   PLT 349 11/18/2012      Chemistry      Component Value Date/Time   NA 141 10/28/2012 1425   NA 138 08/10/2012 1458   NA 138 11/06/2011 1121   K 3.2* 10/28/2012 1425   K 3.8 08/10/2012 1458   K 4.3 11/06/2011 1121   CL 105 10/07/2012 1350   CL 100 08/10/2012 1458   CL 98 11/06/2011 1121   CO2 27 10/28/2012 1425   CO2 26 08/10/2012 1458   CO2 30 11/06/2011 1121   BUN 14.7 10/28/2012 1425   BUN 16 08/10/2012 1458   BUN 15 11/06/2011 1121   CREATININE 1.0 10/28/2012 1425   CREATININE 0.93 08/10/2012 1458   CREATININE 0.9 11/06/2011 1121      Component Value Date/Time   CALCIUM 9.9 10/28/2012 1425   CALCIUM 9.9 08/10/2012 1458   CALCIUM 9.0 11/06/2011 1121   ALKPHOS 190* 10/28/2012 1425   ALKPHOS 171* 08/10/2012 1458   ALKPHOS 110* 11/06/2011 1121   AST 38* 10/28/2012 1425   AST 26 08/10/2012 1458   AST 36 11/06/2011 1121   ALT 24 10/28/2012 1425   ALT 12 08/10/2012 1458   BILITOT 0.21 10/28/2012 1425   BILITOT 0.3 08/10/2012 1458   BILITOT 0.50 11/06/2011 1121       RADIOGRAPHIC STUDIES: No results found.  ASSESSMENT AND PLAN: This is a very pleasant 62 years old [place] American female with recurrent  non-small cell lung cancer currently undergoing maintenance chemotherapy with single agent Alimta status post 2 cycles. She is tolerating her treatment fairly well with no significant adverse effects. I recommended for the patient to proceed with cycle #3 today as scheduled. She would come back for followup visit in 3 weeks with repeat CT scan of the chest, abdomen and pelvis.  All questions were answered. The patient knows to call the clinic with any problems, questions or concerns. We can certainly see  the patient much sooner if necessary.

## 2012-11-18 NOTE — Progress Notes (Signed)
Quick Note:  Call patient with the result and encourage K Rich diet. ______ 

## 2012-11-18 NOTE — Telephone Encounter (Signed)
gv and printed avs...pt did not want to go back to the lab  °

## 2012-11-18 NOTE — Patient Instructions (Signed)
Continue chemotherapy today as scheduled.  Followup visit in 3 weeks with repeat CT scan of the chest, abdomen and pelvis. 

## 2012-11-19 ENCOUNTER — Telehealth: Payer: Self-pay | Admitting: Medical Oncology

## 2012-11-19 NOTE — Progress Notes (Signed)
Mailed a list of potassium rich foods to pt.

## 2012-11-19 NOTE — Telephone Encounter (Signed)
Message copied by Charma Igo on Thu Nov 19, 2012  2:28 PM ------      Message from: Si Gaul      Created: Wed Nov 18, 2012  9:40 PM       Call patient with the result and encourage K Rich diet ------

## 2012-11-25 ENCOUNTER — Ambulatory Visit

## 2012-12-08 ENCOUNTER — Encounter (HOSPITAL_COMMUNITY): Payer: Self-pay

## 2012-12-08 ENCOUNTER — Other Ambulatory Visit: Admitting: Lab

## 2012-12-08 ENCOUNTER — Ambulatory Visit (HOSPITAL_COMMUNITY)
Admission: RE | Admit: 2012-12-08 | Discharge: 2012-12-08 | Disposition: A | Discharge status: Home / Self Care | Source: Ambulatory Visit | Attending: Internal Medicine | Admitting: Internal Medicine

## 2012-12-08 DIAGNOSIS — C3491 Malignant neoplasm of unspecified part of right bronchus or lung: Secondary | ICD-10-CM

## 2012-12-08 DIAGNOSIS — C3490 Malignant neoplasm of unspecified part of unspecified bronchus or lung: Secondary | ICD-10-CM | POA: Insufficient documentation

## 2012-12-08 DIAGNOSIS — I2510 Atherosclerotic heart disease of native coronary artery without angina pectoris: Secondary | ICD-10-CM | POA: Insufficient documentation

## 2012-12-08 DIAGNOSIS — I319 Disease of pericardium, unspecified: Secondary | ICD-10-CM | POA: Insufficient documentation

## 2012-12-08 DIAGNOSIS — R599 Enlarged lymph nodes, unspecified: Secondary | ICD-10-CM | POA: Insufficient documentation

## 2012-12-08 DIAGNOSIS — Z923 Personal history of irradiation: Secondary | ICD-10-CM | POA: Insufficient documentation

## 2012-12-08 DIAGNOSIS — Z79899 Other long term (current) drug therapy: Secondary | ICD-10-CM | POA: Insufficient documentation

## 2012-12-08 DIAGNOSIS — M948X9 Other specified disorders of cartilage, unspecified sites: Secondary | ICD-10-CM | POA: Insufficient documentation

## 2012-12-08 MED ORDER — IOHEXOL 300 MG/ML  SOLN
80.0000 mL | Freq: Once | INTRAMUSCULAR | Status: AC | PRN
  Administered 2012-12-08: 80 mL via INTRAVENOUS

## 2012-12-09 ENCOUNTER — Ambulatory Visit

## 2012-12-09 ENCOUNTER — Other Ambulatory Visit: Admitting: Lab

## 2012-12-09 ENCOUNTER — Other Ambulatory Visit (HOSPITAL_COMMUNITY)

## 2012-12-09 ENCOUNTER — Telehealth: Payer: Self-pay | Admitting: *Deleted

## 2012-12-09 ENCOUNTER — Encounter: Payer: Self-pay | Admitting: Internal Medicine

## 2012-12-09 ENCOUNTER — Ambulatory Visit (HOSPITAL_BASED_OUTPATIENT_CLINIC_OR_DEPARTMENT_OTHER): Admitting: Internal Medicine

## 2012-12-09 VITALS — BP 114/56 | HR 106 | Temp 97.9°F | Resp 19 | Ht 66.0 in | Wt 165.6 lb

## 2012-12-09 DIAGNOSIS — C3491 Malignant neoplasm of unspecified part of right bronchus or lung: Secondary | ICD-10-CM

## 2012-12-09 DIAGNOSIS — C7951 Secondary malignant neoplasm of bone: Secondary | ICD-10-CM

## 2012-12-09 DIAGNOSIS — C3430 Malignant neoplasm of lower lobe, unspecified bronchus or lung: Secondary | ICD-10-CM

## 2012-12-09 DIAGNOSIS — R599 Enlarged lymph nodes, unspecified: Secondary | ICD-10-CM

## 2012-12-09 NOTE — Progress Notes (Unsigned)
Chemo cancelled today per Dr Arbutus Ped.

## 2012-12-09 NOTE — Telephone Encounter (Signed)
appts made and printed. Pt is aware that cs will call her w/ appt for CT chest and CT ABD...td

## 2012-12-09 NOTE — Progress Notes (Signed)
[facility] [phone]   [phone]  OFFICE PROGRESS NOTE  [name], MD [address] [place] [address]  DIAGNOSIS: Recurrent non-small cell lung cancer initially diagnosed as stage IIA (T1 N1 MX) adenocarcinoma in April 2011.   PRIOR THERAPY:  1. Status post 3 cycles of neoadjuvant chemotherapy with carboplatin and Alimta. Last dose was given July 23, 2009. 2. Status post left lower lobectomy with lymph node dissection under the care of Dr. Edwyna Shell on October 17, 2009. 3. Status post concurrent chemoradiation with weekly carboplatin and paclitaxel for disease recurrence. Last dose was given May 07, 2010. 4. Status post 3 cycles of consolidation chemotherapy with carboplatin and Alimta. Last dose was given August 13, 2010. 5. Status post palliative radiotherapy to the right neck area. 6. Systemic chemotherapy with single agent Taxotere 75 mg/m2 every 3 weeks,, status post 6 cycles, last dose was given on 09/03/2011. 7. Systemic chemotherapy with carboplatin for an AUC of 5 and Alimta at 500 mg per meter square given every 3 weeks, status post 6 cycles. 8. Maintenance chemotherapy with single agent Alimta 500 mg/M2 every 3 weeks, status post 3 cycles discontinued today secondary to disease progression.   CURRENT THERAPY: None.  CHEMOTHERAPY INTENT: Palliative  CURRENT # OF CHEMOTHERAPY CYCLES: 3  CURRENT ANTIEMETICS: Zofran, dexamethasone and Compazine  CURRENT SMOKING STATUS: Former smoker  ORAL CHEMOTHERAPY AND CONSENT: None  CURRENT BISPHOSPHONATES USE: None  PAIN MANAGEMENT: None  NARCOTICS INDUCED CONSTIPATION: None  LIVING WILL AND CODE STATUS: Full code initially but no longer term resuscitation.    INTERVAL HISTORY: Kaitlin Reed 62 y.o. female returns to the clinic today for followup visit. The patient is feeling fine with no complaints today. She denied having any significant fever or chills. She has no  nausea or vomiting. The patient denied having any significant chest pain, shortness of breath, cough or hemoptysis. She has no weight loss or night sweats. The patient tolerated the last cycle of her maintenance chemotherapy with single agent Alimta fairly well. She had repeat CT scan of the chest performed recently and she is here for evaluation and discussion of her scan results.  MEDICAL HISTORY: Past Medical History  Diagnosis Date  . Heart murmur   . Tubal pregnancy   . lung ca dx'd 06/2009    chemo comp 07/2010; xrt comp 06/2010  . Hypercholesteremia     h/o    ALLERGIES:  is allergic to codeine; motrin; other; trazodone and nefazodone; banana; and tape.  MEDICATIONS:  Current Outpatient Prescriptions  Medication Sig Dispense Refill  . Ascorbic Acid (VITAMIN C) 1000 MG tablet Take 1,000 mg by mouth daily.      Marland Kitchen aspirin EC 81 MG tablet Take 81 mg by mouth daily.      . calcium carbonate (OS-CAL) 600 MG TABS Take 600 mg by mouth 2 (two) times daily with a meal.      . Cyanocobalamin (B-12 PO) Take 1 tablet by mouth daily.      Marland Kitchen dexamethasone (DECADRON) 4 MG tablet one tablet by mouth twice a day the day before, day of and day after the chemotherapy  40 tablet  1  . folic acid (FOLVITE) 1 MG tablet TAKE 1 TABLET EVERY DAY  30 tablet  3  . IRON COMBINATIONS PO Take 1 tablet by mouth daily.       Marland Kitchen LORazepam (ATIVAN) 0.5 MG tablet Take 1 tablet by mouth or sublingually every  8 hours as needed for nausea. May take one tablet at bedtime as needed for sleep  40 tablet  0  . metoprolol tartrate (LOPRESSOR) 25 MG tablet Take 25 mg by mouth 2 (two) times daily.      . Multiple Vitamin (MULITIVITAMIN WITH MINERALS) TABS Take 1 tablet by mouth daily.        . potassium chloride (MICRO-K) 10 MEQ CR capsule Take 5 mEq by mouth daily.       . prochlorperazine (COMPAZINE) 10 MG tablet TAKE 1 TABLET BY MOUTH EVERY 6 HOURS AS NEEDED.  30 tablet  1  . VITAMIN D, CHOLECALCIFEROL, PO Take 1 tablet by  mouth daily.       Marland Kitchen VITAMIN E PO Take 1 tablet by mouth daily.      . [DISCONTINUED] Alum & Mag Hydroxide-Simeth (MAGIC MOUTHWASH W/LIDOCAINE) SOLN Take 10 mLs by mouth 4 (four) times daily as needed.  500 mL  5   No current facility-administered medications for this visit.    SURGICAL HISTORY:  Past Surgical History  Procedure Laterality Date  . Lung removal, partial  10/17/09    left. pt states they removed small piece of lung due to cancer  . 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   PHYSICAL EXAMINATION: General appearance: alert, cooperative and no distress Head: Normocephalic, without obvious abnormality, atraumatic Neck: no adenopathy Lymph nodes: Cervical, supraclavicular, and axillary nodes normal. Resp: clear to auscultation bilaterally Cardio: regular rate and rhythm, S1, S2 normal, no murmur, click, rub or gallop GI: soft, non-tender; bowel sounds normal; no masses,  no organomegaly Extremities: extremities normal, atraumatic, no cyanosis or edema Neurologic: Alert and oriented X 3, normal strength and tone. Normal symmetric reflexes. Normal coordination and gait  ECOG PERFORMANCE STATUS: 1 - Symptomatic but completely ambulatory  Blood pressure 114/56, pulse 106, temperature 97.9 F (36.6 C), temperature source Oral, resp. rate 19, height 5\' 6"  (1.676 m), weight 165 lb 9.6 oz (75.116 kg).  LABORATORY DATA: Lab Results  Component Value Date   WBC 6.4 11/18/2012   HGB 10.8* 11/18/2012   HCT 32.2* 11/18/2012   MCV 93.3 11/18/2012   PLT 349 11/18/2012      Chemistry      Component Value Date/Time   NA 141 11/18/2012 1348   NA 138 08/10/2012 1458   NA 138 11/06/2011 1121   K 3.3* 11/18/2012 1348   K 3.8 08/10/2012 1458   K 4.3 11/06/2011 1121   CL 105 10/07/2012 1350   CL 100 08/10/2012 1458   CL 98 11/06/2011 1121   CO2 27 11/18/2012 1348   CO2 26 08/10/2012 1458   CO2 30 11/06/2011  1121   BUN 14.4 11/18/2012 1348   BUN 16 08/10/2012 1458   BUN 15 11/06/2011 1121   CREATININE 1.0 11/18/2012 1348   CREATININE 0.93 08/10/2012 1458   CREATININE 0.9 11/06/2011 1121      Component Value Date/Time   CALCIUM 9.6 11/18/2012 1348   CALCIUM 9.9 08/10/2012 1458   CALCIUM 9.0 11/06/2011 1121   ALKPHOS 187* 11/18/2012 1348   ALKPHOS 171* 08/10/2012 1458   ALKPHOS 110* 11/06/2011 1121   AST 43* 11/18/2012 1348   AST 26 08/10/2012 1458   AST 36 11/06/2011 1121   ALT 27 11/18/2012 1348   ALT 12 08/10/2012 1458   ALT 23 11/06/2011 1121   BILITOT 0.21 11/18/2012  1348   BILITOT 0.3 08/10/2012 1458   BILITOT 0.50 11/06/2011 1121       RADIOGRAPHIC STUDIES: Ct Chest W Contrast  12/08/2012   *RADIOLOGY REPORT*  Clinical Data: Restaging. History of lung cancer.  History of surgery, chemotherapy, and radiation.  CT CHEST WITH CONTRAST  Technique:  Multidetector CT imaging of the chest was performed following the standard protocol during bolus administration of intravenous contrast.  Contrast: 80mL OMNIPAQUE IOHEXOL 300 MG/ML  SOLN  Comparison: CT chest 08/28/2012  Findings: Mediastinum/pleura:  There are enlarging mediastinal lymph nodes.  A prevascular lymph node measures 12 mm short axis (previously 9 mm).  There is central hypodensity, suggesting central necrosis.  A right paratracheal lymph node is bulky, now measuring 20 mm in short axis (previously 7 mm).  There is central hypodensity suggesting necrosis.  A subcarinal lymph node measures approximately 7 mm, without significant change from prior.  A small to moderate-sized pericardial effusion is present.  The pericardial effusion has increased in size is slightly since the prior study.  Coronary artery atherosclerotic calcifications are present.  Heart size is within normal limits.  The thoracic aorta is normal in caliber and enhancement.  There is stable pleural thickening in the left posterior hemithorax at the site of postsurgical change.  No  definite pleural effusion is present.  Lung windows:  There are emphysematous changes.  There are postoperative changes of left lower lobectomy, with stable left posterior perihilar parenchymal opacities surrounding areas of bronchiectasis, likely reflect radiation change.  There is stable predominately subpleural paramediastinal interstitial prominence in the right upper lobe, also possibly reflecting radiation change. No suspicious pulmonary mass or pulmonary nodule is identified.  Bone windows:  A sclerotic lesion in the left aspect of the T11 vertebral body appears stable.  A few smaller sclerotic lesions in the thoracic spine vertebral bodies appear similar.  There appears to be a new approximately 3 mm sclerotic lesion in the mid aspect of the T12 vertebral body, to the left of midline.  Vertebral bodies are normal in height.  Upper abdomen:  Adrenal glands within normal limits.  No suspicious findings in the visualized portion of the liver.  IMPRESSION:  1.  Findings worrisome for progression of disease.  Mediastinal lymphadenopathy has definitely progressed.  The most significant change is in a right paratracheal lymph node which now measures 20 mm (previously 7 mm) and shows evidence of central necrosis. 2.  Sclerotic bony lesions again seen, consistent with bony metastasis.  A new 3 mm sclerotic lesion is seen in the left aspect of T12 is suspicious for a new metastasis. 3.  Small to moderate pericardial effusion is slightly larger in size on today's exam. 4.  Stable postoperative and postradiation changes in the lungs.   Original Report Authenticated By: Britta Mccreedy, M.D.    ASSESSMENT AND PLAN: This is a very pleasant 62 years old African American female with recurrent non-small cell lung cancer most recently treated with 3 cycles of maintenance chemotherapy with single agent Alimta but unfortunately she has evidence for disease progression especially in the mediastinal lymph nodes. I discussed the  scan results with the patient today. I recommended for her to discontinue the treatment with maintenance chemotherapy. I recommended for the patient to see Dr. Michell Heinrich for consideration of palliative radiotherapy to the enlarging mediastinal lymph nodes. I would see the patient back for followup visit in 3 months with repeat CT scan of the chest, abdomen and pelvis for restaging of her disease.  She was advised to call immediately if she has any concerning symptoms in the interval.  The patient voices understanding of current disease status and treatment options and is in agreement with the current care plan.  All questions were answered. The patient knows to call the clinic with any problems, questions or concerns. We can certainly see the patient much sooner if necessary.

## 2012-12-09 NOTE — Patient Instructions (Signed)
CURRENT THERAPY: None.  CHEMOTHERAPY INTENT: Palliative  CURRENT # OF CHEMOTHERAPY CYCLES: 3  CURRENT ANTIEMETICS: Zofran, dexamethasone and Compazine  CURRENT SMOKING STATUS: Former smoker  ORAL CHEMOTHERAPY AND CONSENT: None  CURRENT BISPHOSPHONATES USE: None  PAIN MANAGEMENT: None  NARCOTICS INDUCED CONSTIPATION: None  LIVING WILL AND CODE STATUS: Full code initially but no longer term resuscitation.

## 2012-12-15 NOTE — Progress Notes (Signed)
Thoracic Location of Tumor / Histology:RECURRENT NON SMALL CELL CANCER DX INITIALLY 07/2009  Patient presented months ago with symptoms of: disease progression in the mediastinal lymph nodes  Ct scan 12/08/12    Tobacco/Marijuana/Snuff/ETOH use: former smoker   Past/Anticipated interventions by cardiothoracic surgery, if any: Left lower lobectomy with lymph dissection Dr.Burney on 10/17/09  Past/Anticipated interventions by medical oncology, if any: Neoadjuvant Chemotherpay  S/p 3 cycles Carboplatin/Alimta 07/23/09, s/p concurrent weekly carboplatin& paclitaxel  Last dose 05/07/10 S/p 3 cycles consolidation carboplatin& Alimta last dose 08/13/10, systemic Taxotere q 2 weeks,s/p 6 cycles,last dose 09/03/11,. Systemic Carboplatin q 3 weeks s/p 6 cycles. Maintenance chemo single agent Alimta q 3 weeks,s/p 3 cycles d/c 12/09/12, secondary to disease progression, referral for palliative radiation to the enlarging mediastinal lymph nodes  Signs/Symptoms  Weight changes, if any: no  Respiratory complaints, if any: no Hemoptysis, if any :no Pain issues, if any:  no SAFETY ISSUES:  Prior radiation?yes, radiotherapy to right neck 30 Gy at 3 Gy per fraction x 10 fractions  Pacemaker/ICD? NO  Possible current pregnancy? No  Is the patient on methotrexate?NO  Current Complaints / other details: alert,oriented x3, no c/o pain, no dificulty swallowing or chewing, no nausea,

## 2012-12-17 ENCOUNTER — Encounter: Payer: Self-pay | Admitting: Radiation Oncology

## 2012-12-17 ENCOUNTER — Ambulatory Visit
Admission: RE | Admit: 2012-12-17 | Discharge: 2012-12-17 | Disposition: A | Discharge status: Home / Self Care | Source: Ambulatory Visit | Attending: Radiation Oncology | Admitting: Radiation Oncology

## 2012-12-17 VITALS — BP 106/62 | HR 106 | Temp 97.6°F | Resp 20 | Wt 163.2 lb

## 2012-12-17 DIAGNOSIS — M948X9 Other specified disorders of cartilage, unspecified sites: Secondary | ICD-10-CM | POA: Insufficient documentation

## 2012-12-17 DIAGNOSIS — Z79899 Other long term (current) drug therapy: Secondary | ICD-10-CM | POA: Insufficient documentation

## 2012-12-17 DIAGNOSIS — C778 Secondary and unspecified malignant neoplasm of lymph nodes of multiple regions: Secondary | ICD-10-CM | POA: Insufficient documentation

## 2012-12-17 DIAGNOSIS — C801 Malignant (primary) neoplasm, unspecified: Secondary | ICD-10-CM

## 2012-12-17 DIAGNOSIS — C3490 Malignant neoplasm of unspecified part of unspecified bronchus or lung: Secondary | ICD-10-CM | POA: Insufficient documentation

## 2012-12-17 DIAGNOSIS — C3491 Malignant neoplasm of unspecified part of right bronchus or lung: Secondary | ICD-10-CM

## 2012-12-17 DIAGNOSIS — Z923 Personal history of irradiation: Secondary | ICD-10-CM | POA: Insufficient documentation

## 2012-12-17 NOTE — Progress Notes (Signed)
Please see the Nurse Progress Note in the MD Initial Consult Encounter for this patient. 

## 2012-12-17 NOTE — Progress Notes (Signed)
Department of Radiation Oncology  Phone:  [phone] Fax:        [phone]   Name: Kaitlin Reed MRN: [id number]  DOB: 1951-03-31  Date: 12/17/2012  Follow Up Visit Note  Diagnosis: Metastatic non-small cell lung cancer  Summary and Interval since last radiation: Concurrent chemoradiotherapy to a total dose of 60 gray completed 05/06/2010, radiation to the right neck and supraclavicular lymph nodes to a total dose of 30 gray completed 06/04/2011  Interval History: Kaitlin Reed presents today for routine followup.  She has been on single agent Alimta and underwent restaging studies on 12/08/2012 which showed growth of a prevascular lymph node as well as a right paratracheal node. She also had a new 3 mm sclerotic lesion in T12. The rest of her disease was stable. She is asymptomatic. She has good energy levels and appetite. She has no, to assist bone pain headaches or shortness of breath. She was sent to me by Dr. Arbutus Ped for consideration of radiation in the management of this progressive disease.  Allergies:  Allergies  Allergen Reactions  . Codeine Nausea And Vomiting  . Motrin [Ibuprofen] Nausea And Vomiting  . Other Nausea Only    steroids  . Trazodone And Nefazodone Nausea And Vomiting  . Banana Hives  . Tape Swelling    Paper tape okay Cannot use elastic tape    Medications:  Current Outpatient Prescriptions  Medication Sig Dispense Refill  . Ascorbic Acid (VITAMIN C) 1000 MG tablet Take 1,000 mg by mouth daily.      Marland Kitchen aspirin EC 81 MG tablet Take 81 mg by mouth daily.      . Cyanocobalamin (B-12 PO) Take 1 tablet by mouth daily.      . folic acid (FOLVITE) 1 MG tablet TAKE 1 TABLET EVERY DAY  30 tablet  3  . IRON COMBINATIONS PO Take 1 tablet by mouth daily.       Marland Kitchen LORazepam (ATIVAN) 0.5 MG tablet Take 1 tablet by mouth or sublingually every 8 hours as needed for nausea. May take one tablet at bedtime as needed for sleep  40 tablet  0  . metoprolol tartrate  (LOPRESSOR) 25 MG tablet Take 25 mg by mouth 2 (two) times daily.      . Multiple Vitamin (MULITIVITAMIN WITH MINERALS) TABS Take 1 tablet by mouth daily.        . potassium chloride (MICRO-K) 10 MEQ CR capsule Take 5 mEq by mouth daily.       . prochlorperazine (COMPAZINE) 10 MG tablet TAKE 1 TABLET BY MOUTH EVERY 6 HOURS AS NEEDED.  30 tablet  1  . VITAMIN D, CHOLECALCIFEROL, PO Take 1 tablet by mouth daily.       Marland Kitchen VITAMIN E PO Take 1 tablet by mouth daily.      . calcium carbonate (OS-CAL) 600 MG TABS Take 600 mg by mouth 2 (two) times daily with a meal.      . dexamethasone (DECADRON) 4 MG tablet one tablet by mouth twice a day the day before, day of and day after the chemotherapy  40 tablet  1  . [DISCONTINUED] Alum & Mag Hydroxide-Simeth (MAGIC MOUTHWASH W/LIDOCAINE) SOLN Take 10 mLs by mouth 4 (four) times daily as needed.  500 mL  5   No current facility-administered medications for this encounter.    Physical Exam:  Filed Vitals:   12/17/12 0902  BP: 106/62  Pulse: 106  Temp: 97.6 F (36.4 C)  Resp: 20  she is pleasant female in no distress sitting comfortably examining table. She is alert and oriented x3. She has normal respiratory effort. She has 5 out of 5 strength in her bilateral upper and lower extremities.  IMPRESSION: Kaitlin Reed is a 62 y.o. female with progressive disease in the upper mediastinum.  PLAN:  I reviewed with Kaitlin Reed her fields from her previous concurrent chemoradiation as well as her right neck field. It appears that this failure is right in between her treatment fields. We discussed the high risk of complications associated with reirradiation. We discussed the possibility of bronchopleural fistula, heart and other lung damage. At this point I think it's possible using I MRT technique to treat in this area that has been previously exposed to radiation both from her neck field as well as her previous lung fields. I would really like to see her simulation however and  re-create her beams. I let her know that if it would not be safe for her to undergo radiation then I would recommend to proceed forward with chemotherapy with Dr. Arbutus Ped. I have communicated this to him as well.    Lurline Hare, MD

## 2012-12-18 ENCOUNTER — Ambulatory Visit
Admission: RE | Admit: 2012-12-18 | Discharge: 2012-12-18 | Disposition: A | Discharge status: Home / Self Care | Source: Ambulatory Visit | Attending: Radiation Oncology | Admitting: Radiation Oncology

## 2012-12-18 DIAGNOSIS — C3490 Malignant neoplasm of unspecified part of unspecified bronchus or lung: Secondary | ICD-10-CM | POA: Insufficient documentation

## 2012-12-18 DIAGNOSIS — C3491 Malignant neoplasm of unspecified part of right bronchus or lung: Secondary | ICD-10-CM

## 2012-12-18 NOTE — Addendum Note (Signed)
Encounter addended by: Lurline Hare, MD on: 12/18/2012  2:58 PM<BR>     Documentation filed: Visit Diagnoses, Notes Section

## 2012-12-18 NOTE — Progress Notes (Addendum)
[facility] Radiation Oncology Simulation and Treatment Planning Note   Name: Kaitlin Reed MRN: [id number]  Date: 12/18/2012  DOB: 1950-12-11  Status: outpatient    DIAGNOSIS: There were no encounter diagnoses.    SIDE: right   CONSENT VERIFIED: yes   SET UP AND IMMOBILIZATION: Patient is setup supine with arms in a wing board.   NARRATIVE: The patient was brought to the CT Simulation planning suite.  Identity was confirmed.  All relevant records and images related to the planned course of therapy were reviewed.  Then, the patient was positioned in a stable reproducible clinical set-up for radiation therapy.  CT images were obtained.  Skin markings were placed.  The CT images were loaded into the planning software where the target and avoidance structures were contoured.  The radiation prescription was entered and confirmed.   TREATMENT PLANNING NOTE:  Treatment planning then occurred. I have requested 3D simulation with [facility] of the spinal cord, total lungs and gross tumor volume. I have also requested mlcs and an isodose plan.  IMRT may be necessary due to proximity to previously radiated tissue. If used, 1 IMRT device will be formulated.  Special treatment procedure will be performed as Kaitlin Reed will be receiving treatment in a previously radiated area.

## 2012-12-22 ENCOUNTER — Telehealth: Payer: Self-pay | Admitting: *Deleted

## 2012-12-22 ENCOUNTER — Other Ambulatory Visit: Payer: Self-pay | Admitting: *Deleted

## 2012-12-22 NOTE — Telephone Encounter (Signed)
Pt called stating her mouth is still not healed and she will not be able to get her xgeva until it is completed healed per Dr Donnald Garre and her dentist.  Inj appt for tomorrow 12/23/12 cancelled.  SLJ

## 2012-12-23 ENCOUNTER — Ambulatory Visit

## 2012-12-24 ENCOUNTER — Telehealth: Payer: Self-pay | Admitting: *Deleted

## 2012-12-24 NOTE — Telephone Encounter (Signed)
Called patient ,she is working tomorrow until 3 pm, asked if she could come in on Tuesday to discuss a plan whether to have radiation or not, she can be her at 9am, willschedule f/u for that time, and will inform MD, cthanked patient, then called Ermalene Searing to make this appt for the patient , Tuesday 9am 12/29/12 per MD 3:18 PM

## 2012-12-29 ENCOUNTER — Ambulatory Visit
Admission: RE | Admit: 2012-12-29 | Discharge: 2012-12-29 | Disposition: A | Discharge status: Home / Self Care | Source: Ambulatory Visit | Attending: Radiation Oncology | Admitting: Radiation Oncology

## 2012-12-29 DIAGNOSIS — C3491 Malignant neoplasm of unspecified part of right bronchus or lung: Secondary | ICD-10-CM

## 2012-12-29 NOTE — Progress Notes (Signed)
I met with Kaitlin Reed today. We discussed the benefits and potential life threatening toxicities of reirradiation.  We discussed performing a PET scan and an MRI of there brain if she elects to go forward with treatment to ensure these risks were warranted (i.e. No disease elsewhere.  She had a good grasp on the issues we discussed including tracheal fistula and vascular rupture.  I showed her the composite plan in [facility] and the areas of overlap I was concerned about. We discussed watching this area and repeating a CT scan in 3 months vs. Systemic therapy.  She is going to think about it and let me know by the end of the week.

## 2013-01-19 ENCOUNTER — Telehealth: Payer: Self-pay | Admitting: *Deleted

## 2013-01-19 NOTE — Telephone Encounter (Signed)
Spoke with pt regarding xgeva appt tomorrow 01/20/13 and she states that she went to the dentist on 9/22 and they feel her mouth is still not healed enough and will not be able to get xgeva tomorrow.  Will cancel appt for 01/20/13.  SLJ

## 2013-01-20 ENCOUNTER — Ambulatory Visit

## 2013-02-08 ENCOUNTER — Ambulatory Visit
Admission: RE | Admit: 2013-02-08 | Discharge: 2013-02-08 | Disposition: A | Discharge status: Home / Self Care | Source: Ambulatory Visit | Attending: Cardiovascular Disease | Admitting: Cardiovascular Disease

## 2013-02-08 ENCOUNTER — Other Ambulatory Visit: Payer: Self-pay

## 2013-02-08 ENCOUNTER — Other Ambulatory Visit: Payer: Self-pay | Admitting: Cardiovascular Disease

## 2013-02-08 DIAGNOSIS — Z1231 Encounter for screening mammogram for malignant neoplasm of breast: Secondary | ICD-10-CM

## 2013-02-08 DIAGNOSIS — R200 Anesthesia of skin: Secondary | ICD-10-CM

## 2013-02-15 ENCOUNTER — Telehealth: Payer: Self-pay | Admitting: Internal Medicine

## 2013-02-15 ENCOUNTER — Other Ambulatory Visit: Payer: Self-pay | Admitting: *Deleted

## 2013-02-15 NOTE — Telephone Encounter (Signed)
pt called and is having all of her teeth removed and would like to move her inj to 3weeks when all dental is completed

## 2013-02-15 NOTE — Telephone Encounter (Signed)
s.w. pt and advised on lab being moved b4 ct...pt ok and aware

## 2013-02-17 ENCOUNTER — Ambulatory Visit

## 2013-03-09 ENCOUNTER — Telehealth: Payer: Self-pay | Admitting: *Deleted

## 2013-03-09 NOTE — Telephone Encounter (Signed)
Pt will not be getting xgeva injection tomorrow.  Pt states she has not been to the dentist yet so she does not have clearance.  She will still go to lab, CT scan and f/u this week as already scheduled.  SLJ

## 2013-03-10 ENCOUNTER — Encounter (HOSPITAL_COMMUNITY): Payer: Self-pay

## 2013-03-10 ENCOUNTER — Ambulatory Visit (HOSPITAL_COMMUNITY)
Admission: RE | Admit: 2013-03-10 | Discharge: 2013-03-10 | Disposition: A | Discharge status: Home / Self Care | Source: Ambulatory Visit | Attending: Internal Medicine | Admitting: Internal Medicine

## 2013-03-10 ENCOUNTER — Ambulatory Visit

## 2013-03-10 ENCOUNTER — Other Ambulatory Visit (HOSPITAL_BASED_OUTPATIENT_CLINIC_OR_DEPARTMENT_OTHER): Admitting: Lab

## 2013-03-10 DIAGNOSIS — C3490 Malignant neoplasm of unspecified part of unspecified bronchus or lung: Secondary | ICD-10-CM | POA: Insufficient documentation

## 2013-03-10 DIAGNOSIS — C3430 Malignant neoplasm of lower lobe, unspecified bronchus or lung: Secondary | ICD-10-CM

## 2013-03-10 DIAGNOSIS — C7970 Secondary malignant neoplasm of unspecified adrenal gland: Secondary | ICD-10-CM | POA: Insufficient documentation

## 2013-03-10 DIAGNOSIS — C7951 Secondary malignant neoplasm of bone: Secondary | ICD-10-CM | POA: Insufficient documentation

## 2013-03-10 DIAGNOSIS — C771 Secondary and unspecified malignant neoplasm of intrathoracic lymph nodes: Secondary | ICD-10-CM | POA: Insufficient documentation

## 2013-03-10 DIAGNOSIS — Z5111 Encounter for antineoplastic chemotherapy: Secondary | ICD-10-CM | POA: Insufficient documentation

## 2013-03-10 DIAGNOSIS — Z902 Acquired absence of lung [part of]: Secondary | ICD-10-CM | POA: Insufficient documentation

## 2013-03-10 DIAGNOSIS — R911 Solitary pulmonary nodule: Secondary | ICD-10-CM | POA: Insufficient documentation

## 2013-03-10 DIAGNOSIS — C3491 Malignant neoplasm of unspecified part of right bronchus or lung: Secondary | ICD-10-CM

## 2013-03-10 LAB — CBC WITH DIFFERENTIAL/PLATELET
Eosinophils Absolute: 0.1 10*3/uL (ref 0.0–0.5)
HCT: 37.5 % (ref 34.8–46.6)
LYMPH%: 23.5 % (ref 14.0–49.7)
MONO#: 0.6 10*3/uL (ref 0.1–0.9)
NEUT#: 3.5 10*3/uL (ref 1.5–6.5)
NEUT%: 63.5 % (ref 38.4–76.8)
Platelets: 342 10*3/uL (ref 145–400)
WBC: 5.5 10*3/uL (ref 3.9–10.3)

## 2013-03-10 LAB — COMPREHENSIVE METABOLIC PANEL (CC13)
Anion Gap: 12 mEq/L — ABNORMAL HIGH (ref 3–11)
CO2: 25 mEq/L (ref 22–29)
Creatinine: 1.1 mg/dL (ref 0.6–1.1)
Glucose: 101 mg/dl (ref 70–140)
Sodium: 142 mEq/L (ref 136–145)
Total Bilirubin: 0.26 mg/dL (ref 0.20–1.20)
Total Protein: 8.6 g/dL — ABNORMAL HIGH (ref 6.4–8.3)

## 2013-03-10 MED ORDER — IOHEXOL 300 MG/ML  SOLN: 100.0000 mL | Freq: Once | INTRAMUSCULAR | Status: AC | PRN

## 2013-03-11 ENCOUNTER — Ambulatory Visit (HOSPITAL_BASED_OUTPATIENT_CLINIC_OR_DEPARTMENT_OTHER): Admitting: Internal Medicine

## 2013-03-11 ENCOUNTER — Other Ambulatory Visit: Admitting: Lab

## 2013-03-11 ENCOUNTER — Encounter: Payer: Self-pay | Admitting: Internal Medicine

## 2013-03-11 ENCOUNTER — Telehealth: Payer: Self-pay | Admitting: Internal Medicine

## 2013-03-11 VITALS — BP 123/63 | HR 109 | Temp 97.1°F | Resp 18 | Ht 66.0 in | Wt 154.4 lb

## 2013-03-11 DIAGNOSIS — C3490 Malignant neoplasm of unspecified part of unspecified bronchus or lung: Secondary | ICD-10-CM

## 2013-03-11 DIAGNOSIS — C7970 Secondary malignant neoplasm of unspecified adrenal gland: Secondary | ICD-10-CM

## 2013-03-11 DIAGNOSIS — R599 Enlarged lymph nodes, unspecified: Secondary | ICD-10-CM

## 2013-03-11 DIAGNOSIS — Z23 Encounter for immunization: Secondary | ICD-10-CM

## 2013-03-11 DIAGNOSIS — C7951 Secondary malignant neoplasm of bone: Secondary | ICD-10-CM

## 2013-03-11 DIAGNOSIS — C50919 Malignant neoplasm of unspecified site of unspecified female breast: Secondary | ICD-10-CM

## 2013-03-11 DIAGNOSIS — C3430 Malignant neoplasm of lower lobe, unspecified bronchus or lung: Secondary | ICD-10-CM

## 2013-03-11 MED ORDER — INFLUENZA VAC SPLIT QUAD 0.5 ML IM SUSP
0.5000 mL | Freq: Once | INTRAMUSCULAR | Status: AC
  Administered 2013-03-11: 0.5 mL via INTRAMUSCULAR
  Filled 2013-03-11: qty 0.5

## 2013-03-11 NOTE — Telephone Encounter (Signed)
gv and printed appt sched and avs for pt for NOV and DEC....sed added tx. °

## 2013-03-11 NOTE — Progress Notes (Signed)
[facility] [phone]   [phone]  OFFICE PROGRESS NOTE  Kaitlin Rodriguez, MD [address] [place] [address]  DIAGNOSIS: Recurrent non-small cell lung cancer initially diagnosed as stage IIA (T1 N1 MX) adenocarcinoma in April 2011.   PRIOR THERAPY:  1. Status post 3 cycles of neoadjuvant chemotherapy with carboplatin and Alimta. Last dose was given July 23, 2009. 2. Status post left lower lobectomy with lymph node dissection under the care of Dr. Edwyna Shell on October 17, 2009. 3. Status post concurrent chemoradiation with weekly carboplatin and paclitaxel for disease recurrence. Last dose was given May 07, 2010. 4. Status post 3 cycles of consolidation chemotherapy with carboplatin and Alimta. Last dose was given August 13, 2010. 5. Status post palliative radiotherapy to the right neck area. 6. Systemic chemotherapy with single agent Taxotere 75 mg/m2 every 3 weeks,, status post 6 cycles, last dose was given on 09/03/2011. 7. Systemic chemotherapy with carboplatin for an AUC of 5 and Alimta at 500 mg per meter square given every 3 weeks, status post 6 cycles. 8. Maintenance chemotherapy with single agent Alimta 500 mg/M2 every 3 weeks, status post 3 cycles discontinued today secondary to disease progression.   CURRENT THERAPY:  Systemic chemotherapy with single agent gemcitabine 1000 mg/M2 on days 1 and 8 every 3 weeks. First cycle on 03/15/2013.  CHEMOTHERAPY INTENT: Palliative  CURRENT # OF CHEMOTHERAPY CYCLES: 1  CURRENT ANTIEMETICS: Zofran, dexamethasone and Compazine  CURRENT SMOKING STATUS: Former smoker  ORAL CHEMOTHERAPY AND CONSENT: None  CURRENT BISPHOSPHONATES USE: None  PAIN MANAGEMENT: None  NARCOTICS INDUCED CONSTIPATION: None  LIVING WILL AND CODE STATUS: Full code initially but no longer term resuscitation.    INTERVAL HISTORY: Kaitlin Reed 62 y.o. female returns to the clinic today for followup visit.  The patient is feeling fine with no complaints today. She denied having any significant fever or chills. She has no nausea or vomiting. The patient denied having any significant chest pain, shortness of breath, cough or hemoptysis. She has no weight loss or night sweats. The patient was seen by Dr. Michell Heinrich for consideration of palliative radiotherapy to the enlarging mediastinal lymph nodes but other discussion with Dr. Michell Heinrich the patient preferred to stay on observation. She had repeat CT scan of the chest performed recently and she is here for evaluation and discussion of her scan results.  MEDICAL HISTORY: Past Medical History  Diagnosis Date  . Heart murmur   . Tubal pregnancy   . lung ca dx'd 06/2009    chemo comp 07/2010; xrt comp 06/2010  . Hypercholesteremia     h/o    ALLERGIES:  is allergic to codeine; motrin; other; trazodone and nefazodone; banana; and tape.  MEDICATIONS:  Current Outpatient Prescriptions  Medication Sig Dispense Refill  . Ascorbic Acid (VITAMIN C) 1000 MG tablet Take 1,000 mg by mouth daily.      Marland Kitchen aspirin EC 81 MG tablet Take 81 mg by mouth daily.      . calcium carbonate (OS-CAL) 600 MG TABS Take 600 mg by mouth 2 (two) times daily with a meal.      . Cyanocobalamin (B-12 PO) Take 1 tablet by mouth daily.      Marland Kitchen dexamethasone (DECADRON) 4 MG tablet one tablet by mouth twice a day the day before, day of and day after the chemotherapy  40 tablet  1  . folic acid (FOLVITE) 1 MG tablet TAKE 1 TABLET EVERY DAY  30 tablet  3  . IRON COMBINATIONS PO Take 1 tablet by mouth daily.       Marland Kitchen LORazepam (ATIVAN) 0.5 MG tablet Take 1 tablet by mouth or sublingually every 8 hours as needed for nausea. May take one tablet at bedtime as needed for sleep  40 tablet  0  . metoprolol tartrate (LOPRESSOR) 25 MG tablet Take 25 mg by mouth 2 (two) times daily.      . Multiple Vitamin (MULITIVITAMIN WITH MINERALS) TABS Take 1 tablet by mouth daily.        . potassium chloride  (MICRO-K) 10 MEQ CR capsule Take 5 mEq by mouth daily.       . prochlorperazine (COMPAZINE) 10 MG tablet TAKE 1 TABLET BY MOUTH EVERY 6 HOURS AS NEEDED.  30 tablet  1  . VITAMIN D, CHOLECALCIFEROL, PO Take 1 tablet by mouth daily.       Marland Kitchen VITAMIN E PO Take 1 tablet by mouth daily.      . [DISCONTINUED] Alum & Mag Hydroxide-Simeth (MAGIC MOUTHWASH W/LIDOCAINE) SOLN Take 10 mLs by mouth 4 (four) times daily as needed.  500 mL  5   No current facility-administered medications for this visit.    SURGICAL HISTORY:  Past Surgical History  Procedure Laterality Date  . Lung removal, partial  10/17/09    left. pt states they removed small piece of lung due to cancer  . 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; for milk tumor"    REVIEW OF SYSTEMS:  Constitutional: negative Eyes: negative Ears, nose, mouth, throat, and face: negative Respiratory: negative Cardiovascular: negative Gastrointestinal: negative Genitourinary:negative Integument/breast: negative Hematologic/lymphatic: negative Musculoskeletal:negative Neurological: negative Behavioral/Psych: negative Endocrine: negative Allergic/Immunologic: negative   PHYSICAL EXAMINATION: General appearance: alert, cooperative and no distress Head: Normocephalic, without obvious abnormality, atraumatic Neck: no adenopathy Lymph nodes: Cervical, supraclavicular, and axillary nodes normal. Resp: clear to auscultation bilaterally Cardio: regular rate and rhythm, S1, S2 normal, no murmur, click, rub or gallop GI: soft, non-tender; bowel sounds normal; no masses,  no organomegaly Extremities: extremities normal, atraumatic, no cyanosis or edema Neurologic: Alert and oriented X 3, normal strength and tone. Normal symmetric reflexes. Normal coordination and gait  ECOG PERFORMANCE STATUS: 1 - Symptomatic but completely ambulatory  Blood pressure 123/63, pulse 109, temperature 97.1 F (36.2 C), temperature source Oral, resp. rate 18, height 5\' 6"   (1.676 m), weight 154 lb 6.4 oz (70.035 kg).  LABORATORY DATA: Lab Results  Component Value Date   WBC 5.5 03/10/2013   HGB 12.4 03/10/2013   HCT 37.5 03/10/2013   MCV 90.0 03/10/2013   PLT 342 03/10/2013      Chemistry      Component Value Date/Time   NA 142 03/10/2013 0813   NA 138 08/10/2012 1458   NA 138 11/06/2011 1121   K 4.1 03/10/2013 0813   K 3.8 08/10/2012 1458   K 4.3 11/06/2011 1121   CL 105 10/07/2012 1350   CL 100 08/10/2012 1458   CL 98 11/06/2011 1121   CO2 25 03/10/2013 0813   CO2 26 08/10/2012 1458   CO2 30 11/06/2011 1121   BUN 17.8 03/10/2013 0813   BUN 16 08/10/2012 1458   BUN 15 11/06/2011 1121   CREATININE 1.1 03/10/2013 0813   CREATININE 0.93 08/10/2012 1458   CREATININE 0.9 11/06/2011 1121      Component Value Date/Time   CALCIUM 10.6* 03/10/2013 0813   CALCIUM 9.9 08/10/2012 1458   CALCIUM 9.0 11/06/2011 1121  ALKPHOS 375* 03/10/2013 0813   ALKPHOS 171* 08/10/2012 1458   ALKPHOS 110* 11/06/2011 1121   AST 25 03/10/2013 0813   AST 26 08/10/2012 1458   AST 36 11/06/2011 1121   ALT 6 03/10/2013 0813   ALT 12 08/10/2012 1458   ALT 23 11/06/2011 1121   BILITOT 0.26 03/10/2013 0813   BILITOT 0.3 08/10/2012 1458   BILITOT 0.50 11/06/2011 1121       RADIOGRAPHIC STUDIES: Ct Chest W Contrast  03/11/2013   CLINICAL DATA:  Restaging lung carcinoma. Chemotherapy and radiation therapy on going. History of lung resection.  EXAM: CT CHEST, ABDOMEN, AND PELVIS WITH CONTRAST  TECHNIQUE: Multidetector CT imaging of the chest, abdomen and pelvis was performed following the standard protocol during bolus administration of intravenous contrast.  CONTRAST:  100 cc Omnipaque  COMPARISON:  08/28/2012  FINDINGS:   CT CHEST FINDINGS  No axillary or supraclavicular lymphadenopathy. Interval enlargement of a prevascular lymph node anterior to the brachiocephalic vein measuring 15 mm compared to 9 mm on prior. Right lower paratracheal lymph node measures 30 mm increased from 8 mm on  prior. Small pericardial effusion is unchanged. Esophagus is normal.  There is volume loss in the left hemi thorax from prior lung resection. There is a consolidative pattern in the superior right infrahilar region consists with radiation change. There is a new nodule within the medial lingula measuring 12 mm (image 31). Right lung is clear.    CT ABDOMEN AND PELVIS FINDINGS  No focal hepatic lesion. The gallbladder, pancreas, spleen, kidneys are normal. There is bilateral thickening of the adrenal glands which is advanced compared to prior. Masslike lesion in right adrenal gland now measuring 23 mm (image 58) compared to normal 7 mm gland on prior.  The stomach, small bowel, and colon are unremarkable.  Abdominal aorta is normal caliber. There is a new retroperitoneal lymph node between the IVC and aorta measures 15 mm which enlarged from 6 mm on prior (image 64). No free fluid the pelvis. The uterus and bladder normal. No peritoneal disease. No pelvic lymphadenopathy.  Review of the bone windows interval change in the bone metastasis pattern. Individual discrete discrete sclerotic lesions in the left superior acetabulum are now replaced by a diffuse lytic process. Some of these lesions extend through cortex (image 06). The posterior aspect of the left iliac bone has poor cortical definition were previously there was a dense sclerotic lesion. There is increased in size of sclerotic lesions in lower lumbar spine.    IMPRESSION: 1. Progression of lung cancer metastasis with enlarged mediastinal lymph nodes and a new lingular nodule.  2. Progression of metastasis in the abdomen with a new right adrenal gland metastasis and new retroperitoneal periaortic lymphadenopathy.  3. Progression of skeletal metastasis with loss of cortical definition at sites of previous sclerotic bone lesions and enlargement of additional bone lesions.   Electronically Signed   By: Genevive Bi M.D.   On: 03/11/2013 09:20    ASSESSMENT AND PLAN: This is a very pleasant 62 years old [place] American female with recurrent non-small cell lung cancer. Unfortunately she has evidence for disease progression especially in the mediastinal lymph nodes as well as new liver lesions and abdomen lymphadenopathy. I discussed the scan results and showed the images to the patient today.  I discussed with the patient several treatment options for her condition including systemic chemotherapy with single agent gemcitabine 1000 mg/M2 on days 1 and 8 every 3 weeks versus treatment with Tarceva  versus palliative care and hospice referral. The patient is back to proceed with systemic chemotherapy as soon as possible. I will arrange for her to receive the first cycle of chemotherapy with gemcitabine on 03/15/2013. I discussed with the patient adverse effect of this treatment including but not limited to alopecia, myelosuppression, nausea and vomiting, liver or renal dysfunction. She would come back for follow up visit in 3 weeks with the start of cycle #2 She was advised to call immediately if she has any concerning symptoms in the interval.  The patient voices understanding of current disease status and treatment options and is in agreement with the current care plan.  All questions were answered. The patient knows to call the clinic with any problems, questions or concerns. We can certainly see the patient much sooner if necessary.

## 2013-03-13 NOTE — Patient Instructions (Signed)
CURRENT THERAPY: Systemic chemotherapy with single agent gemcitabine 1000 mg/M2 on days 1 and 8 every 3 weeks. First cycle on 03/15/2013.  CHEMOTHERAPY INTENT: Palliative  CURRENT # OF CHEMOTHERAPY CYCLES: 1  CURRENT ANTIEMETICS: Zofran, dexamethasone and Compazine  CURRENT SMOKING STATUS: Former smoker  ORAL CHEMOTHERAPY AND CONSENT: None  CURRENT BISPHOSPHONATES USE: None  PAIN MANAGEMENT: None  NARCOTICS INDUCED CONSTIPATION: None  LIVING WILL AND CODE STATUS: Full code initially but no longer term resuscitation.

## 2013-03-15 ENCOUNTER — Other Ambulatory Visit: Admitting: Lab

## 2013-03-15 ENCOUNTER — Ambulatory Visit

## 2013-03-15 ENCOUNTER — Ambulatory Visit (HOSPITAL_BASED_OUTPATIENT_CLINIC_OR_DEPARTMENT_OTHER)

## 2013-03-15 ENCOUNTER — Other Ambulatory Visit (HOSPITAL_BASED_OUTPATIENT_CLINIC_OR_DEPARTMENT_OTHER): Admitting: Lab

## 2013-03-15 ENCOUNTER — Telehealth: Payer: Self-pay | Admitting: *Deleted

## 2013-03-15 DIAGNOSIS — C770 Secondary and unspecified malignant neoplasm of lymph nodes of head, face and neck: Secondary | ICD-10-CM

## 2013-03-15 DIAGNOSIS — C3490 Malignant neoplasm of unspecified part of unspecified bronchus or lung: Secondary | ICD-10-CM

## 2013-03-15 DIAGNOSIS — C7970 Secondary malignant neoplasm of unspecified adrenal gland: Secondary | ICD-10-CM

## 2013-03-15 DIAGNOSIS — Z5111 Encounter for antineoplastic chemotherapy: Secondary | ICD-10-CM

## 2013-03-15 DIAGNOSIS — C3430 Malignant neoplasm of lower lobe, unspecified bronchus or lung: Secondary | ICD-10-CM

## 2013-03-15 DIAGNOSIS — C3410 Malignant neoplasm of upper lobe, unspecified bronchus or lung: Secondary | ICD-10-CM

## 2013-03-15 DIAGNOSIS — C3491 Malignant neoplasm of unspecified part of right bronchus or lung: Secondary | ICD-10-CM

## 2013-03-15 DIAGNOSIS — C7951 Secondary malignant neoplasm of bone: Secondary | ICD-10-CM

## 2013-03-15 DIAGNOSIS — C50919 Malignant neoplasm of unspecified site of unspecified female breast: Secondary | ICD-10-CM

## 2013-03-15 LAB — COMPREHENSIVE METABOLIC PANEL (CC13)
ALT: <6 U/L (ref 0–55)
Albumin: 3.8 g/dL (ref 3.5–5.0)
Alkaline Phosphatase: 380 U/L — ABNORMAL HIGH (ref 40–150)
BUN: 14 mg/dL (ref 7.0–26.0)
Calcium: 10 mg/dL (ref 8.4–10.4)
Chloride: 104 mEq/L (ref 98–109)
Glucose: 102 mg/dl (ref 70–140)
Potassium: 4 mEq/L (ref 3.5–5.1)
Sodium: 140 mEq/L (ref 136–145)
Total Bilirubin: 0.21 mg/dL (ref 0.20–1.20)
Total Protein: 8.3 g/dL (ref 6.4–8.3)

## 2013-03-15 LAB — CBC WITH DIFFERENTIAL/PLATELET
Basophils Absolute: 0 10*3/uL (ref 0.0–0.1)
Eosinophils Absolute: 0.1 10*3/uL (ref 0.0–0.5)
HCT: 33.7 % — ABNORMAL LOW (ref 34.8–46.6)
HGB: 11.5 g/dL — ABNORMAL LOW (ref 11.6–15.9)
MONO#: 0.6 10*3/uL (ref 0.1–0.9)
MONO%: 11.3 % (ref 0.0–14.0)
NEUT#: 2.9 10*3/uL (ref 1.5–6.5)
NEUT%: 59 % (ref 38.4–76.8)
RDW: 12.7 % (ref 11.2–14.5)
WBC: 4.9 10*3/uL (ref 3.9–10.3)
lymph#: 1.4 10*3/uL (ref 0.9–3.3)

## 2013-03-15 MED ORDER — SODIUM CHLORIDE 0.9 % IV SOLN
1000.0000 mg/m2 | Freq: Once | INTRAVENOUS | Status: AC
  Administered 2013-03-15: 1824 mg via INTRAVENOUS
  Filled 2013-03-15: qty 47.97

## 2013-03-15 MED ORDER — PROCHLORPERAZINE MALEATE 10 MG PO TABS
10.0000 mg | ORAL_TABLET | Freq: Once | ORAL | Status: AC
  Administered 2013-03-15: 10 mg via ORAL

## 2013-03-15 MED ORDER — PROCHLORPERAZINE MALEATE 10 MG PO TABS
ORAL_TABLET | ORAL | Status: AC
  Filled 2013-03-15: qty 1

## 2013-03-15 MED ORDER — SODIUM CHLORIDE 0.9 % IV SOLN
Freq: Once | INTRAVENOUS | Status: AC
  Administered 2013-03-15: 16:00:00 via INTRAVENOUS

## 2013-03-15 NOTE — Telephone Encounter (Signed)
Received letter from pt's dentist Dr. Geoffery Lyons that pt has been cleared for xgeva injections as of 03/15/2013.  Will give note to Dr Donnald Garre to review.  SLJ

## 2013-03-16 ENCOUNTER — Other Ambulatory Visit: Payer: Self-pay | Admitting: Internal Medicine

## 2013-03-17 ENCOUNTER — Other Ambulatory Visit: Payer: Self-pay | Admitting: Internal Medicine

## 2013-03-17 ENCOUNTER — Ambulatory Visit (HOSPITAL_BASED_OUTPATIENT_CLINIC_OR_DEPARTMENT_OTHER)

## 2013-03-17 VITALS — BP 107/72 | HR 100 | Temp 97.2°F

## 2013-03-17 DIAGNOSIS — C3430 Malignant neoplasm of lower lobe, unspecified bronchus or lung: Secondary | ICD-10-CM

## 2013-03-17 DIAGNOSIS — C7951 Secondary malignant neoplasm of bone: Secondary | ICD-10-CM

## 2013-03-17 DIAGNOSIS — C3491 Malignant neoplasm of unspecified part of right bronchus or lung: Secondary | ICD-10-CM

## 2013-03-17 DIAGNOSIS — C7970 Secondary malignant neoplasm of unspecified adrenal gland: Secondary | ICD-10-CM

## 2013-03-17 DIAGNOSIS — C50919 Malignant neoplasm of unspecified site of unspecified female breast: Secondary | ICD-10-CM

## 2013-03-17 MED ORDER — DENOSUMAB 120 MG/1.7ML ~~LOC~~ SOLN
120.0000 mg | Freq: Once | SUBCUTANEOUS | Status: AC
  Administered 2013-03-17: 120 mg via SUBCUTANEOUS
  Filled 2013-03-17: qty 1.7

## 2013-03-17 NOTE — Patient Instructions (Signed)
Denosumab injection  What is this medicine?  DENOSUMAB (den oh sue mab) slows bone breakdown. Prolia is used to treat osteoporosis in women after menopause and in men. Xgeva is used to prevent bone fractures and other bone problems caused by cancer bone metastases. Xgeva is also used to treat giant cell tumor of the bone.  This medicine may be used for other purposes; ask your health care provider or pharmacist if you have questions.  COMMON BRAND NAME(S): Prolia, XGEVA  What should I tell my health care provider before I take this medicine?  They need to know if you have any of these conditions:  -dental disease  -eczema  -infection or history of infections  -kidney disease or on dialysis  -low blood calcium or vitamin D  -malabsorption syndrome  -scheduled to have surgery or tooth extraction  -taking medicine that contains denosumab  -thyroid or parathyroid disease  -an unusual reaction to denosumab, other medicines, foods, dyes, or preservatives  -pregnant or trying to get pregnant  -breast-feeding  How should I use this medicine?  This medicine is for injection under the skin. It is given by a health care professional in a hospital or clinic setting.  If you are getting Prolia, a special MedGuide will be given to you by the pharmacist with each prescription and refill. Be sure to read this information carefully each time.  For Prolia, talk to your pediatrician regarding the use of this medicine in children. Special care may be needed. For Xgeva, talk to your pediatrician regarding the use of this medicine in children. While this drug may be prescribed for children as young as 13 years for selected conditions, precautions do apply.  Overdosage: If you think you've taken too much of this medicine contact a poison control center or emergency room at once.  Overdosage: If you think you have taken too much of this medicine contact a poison control center or emergency room at once.  NOTE: This medicine is only for  you. Do not share this medicine with others.  What if I miss a dose?  It is important not to miss your dose. Call your doctor or health care professional if you are unable to keep an appointment.  What may interact with this medicine?  Do not take this medicine with any of the following medications:  -other medicines containing denosumab  This medicine may also interact with the following medications:  -medicines that suppress the immune system  -medicines that treat cancer  -steroid medicines like prednisone or cortisone  This list may not describe all possible interactions. Give your health care provider a list of all the medicines, herbs, non-prescription drugs, or dietary supplements you use. Also tell them if you smoke, drink alcohol, or use illegal drugs. Some items may interact with your medicine.  What should I watch for while using this medicine?  Visit your doctor or health care professional for regular checks on your progress. Your doctor or health care professional may order blood tests and other tests to see how you are doing.  Call your doctor or health care professional if you get a cold or other infection while receiving this medicine. Do not treat yourself. This medicine may decrease your body's ability to fight infection.  You should make sure you get enough calcium and vitamin D while you are taking this medicine, unless your doctor tells you not to. Discuss the foods you eat and the vitamins you take with your health care professional.    See your dentist regularly. Brush and floss your teeth as directed. Before you have any dental work done, tell your dentist you are receiving this medicine.  Do not become pregnant while taking this medicine or for 5 months after stopping it. Women should inform their doctor if they wish to become pregnant or think they might be pregnant. There is a potential for serious side effects to an unborn child. Talk to your health care professional or pharmacist for more  information.  What side effects may I notice from receiving this medicine?  Side effects that you should report to your doctor or health care professional as soon as possible:  -allergic reactions like skin rash, itching or hives, swelling of the face, lips, or tongue  -breathing problems  -chest pain  -fast, irregular heartbeat  -feeling faint or lightheaded, falls  -fever, chills, or any other sign of infection  -muscle spasms, tightening, or twitches  -numbness or tingling  -skin blisters or bumps, or is dry, peels, or red  -slow healing or unexplained pain in the mouth or jaw  -unusual bleeding or bruising  Side effects that usually do not require medical attention (Report these to your doctor or health care professional if they continue or are bothersome.):  -muscle pain  -stomach upset, gas  This list may not describe all possible side effects. Call your doctor for medical advice about side effects. You may report side effects to FDA at 1-800-FDA-1088.  Where should I keep my medicine?  This medicine is only given in a clinic, doctor's office, or other health care setting and will not be stored at home.  NOTE: This sheet is a summary. It may not cover all possible information. If you have questions about this medicine, talk to your doctor, pharmacist, or health care provider.  © 2014, [employer]. (2011-10-14 12:37:47)

## 2013-03-17 NOTE — Progress Notes (Signed)
Patient in for Xgeva injection today. Had labs on 03/15/2013. Patient states that during office visit with Dr. Arbutus Ped, she received information about Xgeva. Patient also states that Dr. Arbutus Ped explained treatment and any other options available to her. Patient signed consent form for Xgeva injection during today's visit at 1543. Injection given as ordered per Dr. Arbutus Ped. Patient tolerated injection well. Xgeva information sheet given to Patient along with After Visit Summary and lab results from 03/15/2013.  Zenaida Deed, LPN

## 2013-03-18 ENCOUNTER — Telehealth: Payer: Self-pay | Admitting: *Deleted

## 2013-03-18 NOTE — Telephone Encounter (Signed)
Patient came to office, face noted by Dr Arbutus Ped and it does not appear "as bad as patient thinks". Reassured patient to continue to watch, call if it worsens or she has difficulty breathing or throat swelling.

## 2013-03-18 NOTE — Telephone Encounter (Signed)
Patient calling in to report that this morning her face is swollen is flush looking. She wonders if this could be related to Xgeva injection yesterday. No difficulty with breathing or throat tightness. Spoke with Dr Arbutus Ped, he does not want to do any intervention at this time. Patient to monitor, drink lots of fluids and call if it worsens. Patient insistent that it is more swollen then she is comfortable with and will come by the office for [place] to look at.

## 2013-03-22 ENCOUNTER — Other Ambulatory Visit: Payer: Self-pay

## 2013-03-22 ENCOUNTER — Other Ambulatory Visit (HOSPITAL_BASED_OUTPATIENT_CLINIC_OR_DEPARTMENT_OTHER): Admitting: Lab

## 2013-03-22 ENCOUNTER — Ambulatory Visit

## 2013-03-22 DIAGNOSIS — C50919 Malignant neoplasm of unspecified site of unspecified female breast: Secondary | ICD-10-CM

## 2013-03-22 DIAGNOSIS — C7970 Secondary malignant neoplasm of unspecified adrenal gland: Secondary | ICD-10-CM

## 2013-03-22 DIAGNOSIS — C3490 Malignant neoplasm of unspecified part of unspecified bronchus or lung: Secondary | ICD-10-CM

## 2013-03-22 DIAGNOSIS — C3430 Malignant neoplasm of lower lobe, unspecified bronchus or lung: Secondary | ICD-10-CM

## 2013-03-22 DIAGNOSIS — C7951 Secondary malignant neoplasm of bone: Secondary | ICD-10-CM

## 2013-03-22 LAB — CBC WITH DIFFERENTIAL/PLATELET
BASO%: 0 % (ref 0.0–2.0)
Eosinophils Absolute: 0.1 10*3/uL (ref 0.0–0.5)
HGB: 10.7 g/dL — ABNORMAL LOW (ref 11.6–15.9)
LYMPH%: 39.8 % (ref 14.0–49.7)
MCHC: 34.5 g/dL (ref 31.5–36.0)
MCV: 86.6 fL (ref 79.5–101.0)
MONO#: 0 10*3/uL — ABNORMAL LOW (ref 0.1–0.9)
MONO%: 2 % (ref 0.0–14.0)
NEUT#: 1.1 10*3/uL — ABNORMAL LOW (ref 1.5–6.5)
RBC: 3.58 10*6/uL — ABNORMAL LOW (ref 3.70–5.45)
RDW: 12.4 % (ref 11.2–14.5)
WBC: 2 10*3/uL — ABNORMAL LOW (ref 3.9–10.3)
lymph#: 0.8 10*3/uL — ABNORMAL LOW (ref 0.9–3.3)
nRBC: 0 % (ref 0–0)

## 2013-03-22 MED ORDER — METHYLPREDNISOLONE 4 MG PO KIT: PACK | ORAL | Status: DC

## 2013-03-22 NOTE — Progress Notes (Signed)
Pt with a rash in underarms, groin, neck, trunk and back. Itching and wiggling. She had methopednisolone dose pack on Sat and Sun. Is taking benadryl 1 tab q4 hrs with no help. Face is less swollen than on 20th. No SOB, no difficulty swallowing. Pt also states she feels dehydrated-dry mouth, weak, lethargic. Dr Arbutus Ped informed. Medrol dose pak ordered, pt stated she will skip this treatment (with OK from Dr Arbutus Ped). Next appts on the books. Discussed oatmeal baths.

## 2013-03-29 ENCOUNTER — Encounter: Payer: Self-pay | Admitting: Physician Assistant

## 2013-03-29 ENCOUNTER — Telehealth: Payer: Self-pay | Admitting: Internal Medicine

## 2013-03-29 ENCOUNTER — Other Ambulatory Visit (HOSPITAL_BASED_OUTPATIENT_CLINIC_OR_DEPARTMENT_OTHER): Admitting: Lab

## 2013-03-29 ENCOUNTER — Ambulatory Visit (HOSPITAL_BASED_OUTPATIENT_CLINIC_OR_DEPARTMENT_OTHER): Admitting: Physician Assistant

## 2013-03-29 VITALS — BP 120/70 | HR 113 | Temp 97.4°F | Resp 18 | Ht 66.0 in | Wt 153.7 lb

## 2013-03-29 DIAGNOSIS — C7951 Secondary malignant neoplasm of bone: Secondary | ICD-10-CM

## 2013-03-29 DIAGNOSIS — C3430 Malignant neoplasm of lower lobe, unspecified bronchus or lung: Secondary | ICD-10-CM

## 2013-03-29 DIAGNOSIS — C3490 Malignant neoplasm of unspecified part of unspecified bronchus or lung: Secondary | ICD-10-CM

## 2013-03-29 DIAGNOSIS — C3491 Malignant neoplasm of unspecified part of right bronchus or lung: Secondary | ICD-10-CM

## 2013-03-29 LAB — COMPREHENSIVE METABOLIC PANEL (CC13)
ALT: 9 U/L (ref 0–55)
AST: 20 U/L (ref 5–34)
Albumin: 3.7 g/dL (ref 3.5–5.0)
Anion Gap: 8 mEq/L (ref 3–11)
CO2: 27 mEq/L (ref 22–29)
Calcium: 9.4 mg/dL (ref 8.4–10.4)
Chloride: 105 mEq/L (ref 98–109)
Creatinine: 1 mg/dL (ref 0.6–1.1)
Glucose: 103 mg/dl (ref 70–140)
Potassium: 3.9 mEq/L (ref 3.5–5.1)
Sodium: 140 mEq/L (ref 136–145)
Total Bilirubin: <0.2 mg/dL (ref 0.20–1.20)
Total Protein: 7.7 g/dL (ref 6.4–8.3)

## 2013-03-29 LAB — CBC WITH DIFFERENTIAL/PLATELET
BASO%: 0.9 % (ref 0.0–2.0)
EOS%: 12.6 % — ABNORMAL HIGH (ref 0.0–7.0)
Eosinophils Absolute: 0.9 10*3/uL — ABNORMAL HIGH (ref 0.0–0.5)
HCT: 33.2 % — ABNORMAL LOW (ref 34.8–46.6)
MCH: 29.4 pg (ref 25.1–34.0)
MCHC: 31.9 g/dL (ref 31.5–36.0)
MONO#: 0.4 10*3/uL (ref 0.1–0.9)
NEUT#: 4.1 10*3/uL (ref 1.5–6.5)
NEUT%: 58.1 % (ref 38.4–76.8)
RBC: 3.61 10*6/uL — ABNORMAL LOW (ref 3.70–5.45)
RDW: 13.9 % (ref 11.2–14.5)
WBC: 7.1 10*3/uL (ref 3.9–10.3)
lymph#: 1.6 10*3/uL (ref 0.9–3.3)

## 2013-03-29 NOTE — Telephone Encounter (Signed)
gv pt appt schedule for december.  °

## 2013-03-29 NOTE — Progress Notes (Addendum)
[facility] [phone]   [phone]  SHARED VISIT PROGRESS NOTE  [name], MD [address] [place] [address]  DIAGNOSIS: Recurrent non-small cell lung cancer initially diagnosed as stage IIA (T1 N1 MX) adenocarcinoma in April 2011.   PRIOR THERAPY:  1. Status post 3 cycles of neoadjuvant chemotherapy with carboplatin and Alimta. Last dose was given July 23, 2009. 2. Status post left lower lobectomy with lymph node dissection under the care of Dr. Edwyna Shell on October 17, 2009. 3. Status post concurrent chemoradiation with weekly carboplatin and paclitaxel for disease recurrence. Last dose was given May 07, 2010. 4. Status post 3 cycles of consolidation chemotherapy with carboplatin and Alimta. Last dose was given August 13, 2010. 5. Status post palliative radiotherapy to the right neck area. 6. Systemic chemotherapy with single agent Taxotere 75 mg/m2 every 3 weeks,, status post 6 cycles, last dose was given on 09/03/2011. 7. Systemic chemotherapy with carboplatin for an AUC of 5 and Alimta at 500 mg per meter square given every 3 weeks, status post 6 cycles. 8. Maintenance chemotherapy with single agent Alimta 500 mg/M2 every 3 weeks, status post 3 cycles discontinued today secondary to disease progression.   CURRENT THERAPY:  Systemic chemotherapy with single agent gemcitabine 1000 mg/M2 on days 1 and 8 every 3 weeks. First cycle on 03/15/2013.  CHEMOTHERAPY INTENT: Palliative  CURRENT # OF CHEMOTHERAPY CYCLES: 1  CURRENT ANTIEMETICS: Zofran, dexamethasone and Compazine  CURRENT SMOKING STATUS: Former smoker  ORAL CHEMOTHERAPY AND CONSENT: None  CURRENT BISPHOSPHONATES USE: None  PAIN MANAGEMENT: None  NARCOTICS INDUCED CONSTIPATION: None  LIVING WILL AND CODE STATUS: Full code initially but no longer term resuscitation.    INTERVAL HISTORY: JESSIC Reed 62 y.o. female returns to the clinic today for followup  visit. She continues to complain of generalized itching. She had generalized itching and swelling after receiving Xgeva injection. She was placed on a Medrol Dosepak with some decrease symptoms but continues to have significant itching although the swelling has abated. Day 8 of her first cycle of single agent gemcitabine was held due to neutropenia and the reaction from the [facility] with the swelling, rash  and generalized itching. She presents today for reevaluation regarding the generalized itching and rash. She voiced no other specific complaints today. She was able to enjoy her Thanksgiving relatively well with the exception of the itching.   MEDICAL HISTORY: Past Medical History  Diagnosis Date  . Heart murmur   . Tubal pregnancy   . lung ca dx'd 06/2009    chemo comp 07/2010; xrt comp 06/2010  . Hypercholesteremia     h/o    ALLERGIES:  is allergic to codeine; motrin; other; trazodone and nefazodone; banana; tape; and xgeva.  MEDICATIONS:  Current Outpatient Prescriptions  Medication Sig Dispense Refill  . Ascorbic Acid (VITAMIN C) 1000 MG tablet Take 1,000 mg by mouth daily.      Marland Kitchen aspirin EC 81 MG tablet Take 81 mg by mouth daily.      . calcium carbonate (OS-CAL) 600 MG TABS Take 600 mg by mouth 2 (two) times daily with a meal.      . Cyanocobalamin (B-12 PO) Take 1 tablet by mouth daily.      . folic acid (FOLVITE) 1 MG tablet TAKE 1 TABLET EVERY DAY  30 tablet  3  . IRON COMBINATIONS PO Take 1 tablet by mouth daily.       Marland Kitchen  methylPREDNISolone (MEDROL DOSEPAK) 4 MG tablet follow package directions  21 tablet  0  . metoprolol tartrate (LOPRESSOR) 25 MG tablet Take 25 mg by mouth 2 (two) times daily.      . Multiple Vitamin (MULITIVITAMIN WITH MINERALS) TABS Take 1 tablet by mouth daily.        . potassium chloride (MICRO-K) 10 MEQ CR capsule Take 5 mEq by mouth daily.       . prochlorperazine (COMPAZINE) 10 MG tablet TAKE 1 TABLET BY MOUTH EVERY 6 HOURS AS NEEDED.  30 tablet  1  .  VITAMIN D, CHOLECALCIFEROL, PO Take 1 tablet by mouth daily.       Marland Kitchen VITAMIN E PO Take 1 tablet by mouth daily.      Marland Kitchen dexamethasone (DECADRON) 4 MG tablet one tablet by mouth twice a day the day before, day of and day after the chemotherapy  40 tablet  1  . LORazepam (ATIVAN) 0.5 MG tablet Take 1 tablet by mouth or sublingually every 8 hours as needed for nausea. May take one tablet at bedtime as needed for sleep  40 tablet  0  . [DISCONTINUED] Alum & Mag Hydroxide-Simeth (MAGIC MOUTHWASH W/LIDOCAINE) SOLN Take 10 mLs by mouth 4 (four) times daily as needed.  500 mL  5   No current facility-administered medications for this visit.    SURGICAL HISTORY:  Past Surgical History  Procedure Laterality Date  . Lung removal, partial  10/17/09    left. pt states they removed small piece of lung due to cancer  . 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; for milk tumor"    REVIEW OF SYSTEMS:  Constitutional: negative Eyes: negative Ears, nose, mouth, throat, and face: negative Respiratory: negative Cardiovascular: negative Gastrointestinal: negative Genitourinary:negative Integument/breast: positive for pruritus and rash Hematologic/lymphatic: negative Musculoskeletal:negative Neurological: negative Behavioral/Psych: negative Endocrine: negative Allergic/Immunologic: negative   PHYSICAL EXAMINATION: General appearance: alert, cooperative and no distress Head: Normocephalic, without obvious abnormality, atraumatic Neck: no adenopathy Lymph nodes: Cervical, supraclavicular, and axillary nodes normal. Resp: clear to auscultation bilaterally Cardio: regular rate and rhythm, S1, S2 normal, no murmur, click, rub or gallop GI: soft, non-tender; bowel sounds normal; no masses,  no organomegaly Extremities: extremities normal, atraumatic, no cyanosis or edema Neurologic: Alert and oriented X 3, normal strength and tone. Normal symmetric reflexes. Normal coordination and gait Skin: Faint erythematous  macular eruptions on the face neck and anterior chest as well as the trunk of the body.  ECOG PERFORMANCE STATUS: 1 - Symptomatic but completely ambulatory  Blood pressure 120/70, pulse 113, temperature 97.4 F (36.3 C), temperature source Oral, resp. rate 18, height 5\' 6"  (1.676 m), weight 153 lb 11.2 oz (69.718 kg), SpO2 97.00%.  LABORATORY DATA: Lab Results  Component Value Date   WBC 7.1 03/29/2013   HGB 10.6* 03/29/2013   HCT 33.2* 03/29/2013   MCV 92.0 03/29/2013   PLT 316 03/29/2013      Chemistry      Component Value Date/Time   NA 140 03/29/2013 1503   NA 138 08/10/2012 1458   NA 138 11/06/2011 1121   K 3.9 03/29/2013 1503   K 3.8 08/10/2012 1458   K 4.3 11/06/2011 1121   CL 105 10/07/2012 1350   CL 100 08/10/2012 1458   CL 98 11/06/2011 1121   CO2 27 03/29/2013 1503   CO2 26 08/10/2012 1458   CO2 30 11/06/2011 1121   BUN 10.2 03/29/2013 1503   BUN 16 08/10/2012 1458  BUN 15 11/06/2011 1121   CREATININE 1.0 03/29/2013 1503   CREATININE 0.93 08/10/2012 1458   CREATININE 0.9 11/06/2011 1121      Component Value Date/Time   CALCIUM 9.4 03/29/2013 1503   CALCIUM 9.9 08/10/2012 1458   CALCIUM 9.0 11/06/2011 1121   ALKPHOS 327* 03/29/2013 1503   ALKPHOS 171* 08/10/2012 1458   ALKPHOS 110* 11/06/2011 1121   AST 20 03/29/2013 1503   AST 26 08/10/2012 1458   AST 36 11/06/2011 1121   ALT 9 03/29/2013 1503   ALT 12 08/10/2012 1458   ALT 23 11/06/2011 1121   BILITOT <0.20 03/29/2013 1503   BILITOT 0.3 08/10/2012 1458   BILITOT 0.50 11/06/2011 1121       RADIOGRAPHIC STUDIES: Ct Chest W Contrast  03/11/2013   CLINICAL DATA:  Restaging lung carcinoma. Chemotherapy and radiation therapy on going. History of lung resection.  EXAM: CT CHEST, ABDOMEN, AND PELVIS WITH CONTRAST  TECHNIQUE: Multidetector CT imaging of the chest, abdomen and pelvis was performed following the standard protocol during bolus administration of intravenous contrast.  CONTRAST:  100 cc Omnipaque  COMPARISON:  08/28/2012   FINDINGS:   CT CHEST FINDINGS  No axillary or supraclavicular lymphadenopathy. Interval enlargement of a prevascular lymph node anterior to the brachiocephalic vein measuring 15 mm compared to 9 mm on prior. Right lower paratracheal lymph node measures 30 mm increased from 8 mm on prior. Small pericardial effusion is unchanged. Esophagus is normal.  There is volume loss in the left hemi thorax from prior lung resection. There is a consolidative pattern in the superior right infrahilar region consists with radiation change. There is a new nodule within the medial lingula measuring 12 mm (image 31). Right lung is clear.    CT ABDOMEN AND PELVIS FINDINGS  No focal hepatic lesion. The gallbladder, pancreas, spleen, kidneys are normal. There is bilateral thickening of the adrenal glands which is advanced compared to prior. Masslike lesion in right adrenal gland now measuring 23 mm (image 58) compared to normal 7 mm gland on prior.  The stomach, small bowel, and colon are unremarkable.  Abdominal aorta is normal caliber. There is a new retroperitoneal lymph node between the IVC and aorta measures 15 mm which enlarged from 6 mm on prior (image 64). No free fluid the pelvis. The uterus and bladder normal. No peritoneal disease. No pelvic lymphadenopathy.  Review of the bone windows interval change in the bone metastasis pattern. Individual discrete discrete sclerotic lesions in the left superior acetabulum are now replaced by a diffuse lytic process. Some of these lesions extend through cortex (image 06). The posterior aspect of the left iliac bone has poor cortical definition were previously there was a dense sclerotic lesion. There is increased in size of sclerotic lesions in lower lumbar spine.    IMPRESSION: 1. Progression of lung cancer metastasis with enlarged mediastinal lymph nodes and a new lingular nodule.  2. Progression of metastasis in the abdomen with a new right adrenal gland metastasis and new  retroperitoneal periaortic lymphadenopathy.  3. Progression of skeletal metastasis with loss of cortical definition at sites of previous sclerotic bone lesions and enlargement of additional bone lesions.   Electronically Signed   By: Genevive Bi M.D.   On: 03/11/2013 09:20   ASSESSMENT AND PLAN: This is a very pleasant 62 years old [place] American female with recurrent non-small cell lung cancer. Unfortunately she has evidence for disease progression especially in the mediastinal lymph nodes as well as new liver  lesions and abdomen lymphadenopathy. She's now being treated with single agent gemcitabine 1000 mg per meter squared on days 1 and 8 every 3 weeks, status post day 1 of cycle 1. Day 8 of cycle 1 was held due to neutropenia as well as her adverse reaction to the Xgeva injection. Patient was discussed with also seen by Dr. Arbutus Ped. For her continued itching patient was advised to use Aveeno oatmeal baths as well as Sarna lotion for symptomatic relief. We will allow her to completely get over this generalized itching and then may change her to Zometa for the treatment of bone metastasis. She'll followup in one week at the start of cycle #2 and we will dose reduce his gemcitabine to 800 mg per meter squared in deference to the neutropenia caused by a the previous dose of 1000 mg per meter squared.    Laural Benes, Joseline Mccampbell E, PA-C   She was advised to call immediately if she has any concerning symptoms in the interval.  The patient voices understanding of current disease status and treatment options and is in agreement with the current care plan.  All questions were answered. The patient knows to call the clinic with any problems, questions or concerns. We can certainly see the patient much sooner if necessary.  ADDENDUM: Hematology/Oncology Attending: I had face to face encounter with the patient. I recommended her care plan. She is a very pleasant 62 years old [place] American female with  recurrent non-small cell lung cancer status post several chemotherapy regimens as well as radiation. She was recently found to have evidence for disease progression and the patient was started on treatment with single agent gemcitabine status post day 1 of the first cycle. She missed day 8 secondary to neutropenia. Advised the patient to continue her treatment with single agent gemcitabine but I would reduce the dose of gemcitabine to 800 mg/M2 starting from cycle #2. The patient also had some reaction to the treatment with Xgeva for her bone disease with significant itching as well as swelling of her face. I would change her treatment with Zometa. She would come back for follow up visit in 3 weeks with the next cycle of her treatment. She was advised to call immediately if she has any concerning symptoms in the interval.  Disclaimer: This note was dictated with voice recognition software. Similar sounding words can inadvertently be transcribed and may not be corrected upon review. Lajuana Matte., MD 05/29/2013

## 2013-03-31 NOTE — Patient Instructions (Signed)
Use Aveeno oatmeal baths as well as Sarna lotion for your generalized itching Followup in one week, prior to the start of his next scheduled cycle of chemotherapy

## 2013-04-01 ENCOUNTER — Other Ambulatory Visit: Payer: Self-pay | Admitting: Internal Medicine

## 2013-04-01 DIAGNOSIS — C3490 Malignant neoplasm of unspecified part of unspecified bronchus or lung: Secondary | ICD-10-CM

## 2013-04-05 ENCOUNTER — Ambulatory Visit (HOSPITAL_BASED_OUTPATIENT_CLINIC_OR_DEPARTMENT_OTHER): Admitting: Physician Assistant

## 2013-04-05 ENCOUNTER — Telehealth: Payer: Self-pay | Admitting: Internal Medicine

## 2013-04-05 ENCOUNTER — Encounter: Payer: Self-pay | Admitting: Internal Medicine

## 2013-04-05 ENCOUNTER — Encounter: Payer: Self-pay | Admitting: Physician Assistant

## 2013-04-05 ENCOUNTER — Ambulatory Visit (HOSPITAL_BASED_OUTPATIENT_CLINIC_OR_DEPARTMENT_OTHER)

## 2013-04-05 ENCOUNTER — Other Ambulatory Visit (HOSPITAL_BASED_OUTPATIENT_CLINIC_OR_DEPARTMENT_OTHER): Admitting: Lab

## 2013-04-05 VITALS — BP 121/60 | HR 102 | Temp 97.2°F | Resp 18 | Ht 66.0 in | Wt 151.9 lb

## 2013-04-05 DIAGNOSIS — C3490 Malignant neoplasm of unspecified part of unspecified bronchus or lung: Secondary | ICD-10-CM

## 2013-04-05 DIAGNOSIS — C787 Secondary malignant neoplasm of liver and intrahepatic bile duct: Secondary | ICD-10-CM

## 2013-04-05 DIAGNOSIS — C3491 Malignant neoplasm of unspecified part of right bronchus or lung: Secondary | ICD-10-CM

## 2013-04-05 DIAGNOSIS — D709 Neutropenia, unspecified: Secondary | ICD-10-CM

## 2013-04-05 DIAGNOSIS — C3430 Malignant neoplasm of lower lobe, unspecified bronchus or lung: Secondary | ICD-10-CM

## 2013-04-05 DIAGNOSIS — C7951 Secondary malignant neoplasm of bone: Secondary | ICD-10-CM

## 2013-04-05 DIAGNOSIS — C770 Secondary and unspecified malignant neoplasm of lymph nodes of head, face and neck: Secondary | ICD-10-CM

## 2013-04-05 DIAGNOSIS — Z5111 Encounter for antineoplastic chemotherapy: Secondary | ICD-10-CM

## 2013-04-05 LAB — CBC WITH DIFFERENTIAL/PLATELET
BASO%: 0.3 % (ref 0.0–2.0)
Basophils Absolute: 0 10*3/uL (ref 0.0–0.1)
EOS%: 32.7 % — ABNORMAL HIGH (ref 0.0–7.0)
Eosinophils Absolute: 1.9 10*3/uL — ABNORMAL HIGH (ref 0.0–0.5)
HCT: 32.5 % — ABNORMAL LOW (ref 34.8–46.6)
HGB: 10.9 g/dL — ABNORMAL LOW (ref 11.6–15.9)
LYMPH%: 27.5 % (ref 14.0–49.7)
MCH: 29.7 pg (ref 25.1–34.0)
MCHC: 33.5 g/dL (ref 31.5–36.0)
MCV: 88.6 fL (ref 79.5–101.0)
MONO#: 0.8 10*3/uL (ref 0.1–0.9)
MONO%: 13.9 % (ref 0.0–14.0)
NEUT#: 1.5 10*3/uL (ref 1.5–6.5)
NEUT%: 25.6 % — ABNORMAL LOW (ref 38.4–76.8)
Platelets: 465 10*3/uL — ABNORMAL HIGH (ref 145–400)
RBC: 3.67 10*6/uL — ABNORMAL LOW (ref 3.70–5.45)
RDW: 14.1 % (ref 11.2–14.5)
WBC: 5.8 10*3/uL (ref 3.9–10.3)
lymph#: 1.6 10*3/uL (ref 0.9–3.3)
nRBC: 0 % (ref 0–0)

## 2013-04-05 LAB — COMPREHENSIVE METABOLIC PANEL (CC13)
ALT: 7 U/L (ref 0–55)
AST: 17 U/L (ref 5–34)
Albumin: 3.4 g/dL — ABNORMAL LOW (ref 3.5–5.0)
Alkaline Phosphatase: 304 U/L — ABNORMAL HIGH (ref 40–150)
Anion Gap: 10 mEq/L (ref 3–11)
BUN: 16.3 mg/dL (ref 7.0–26.0)
CO2: 25 mEq/L (ref 22–29)
Calcium: 9.1 mg/dL (ref 8.4–10.4)
Chloride: 104 mEq/L (ref 98–109)
Creatinine: 0.8 mg/dL (ref 0.6–1.1)
Glucose: 94 mg/dl (ref 70–140)
Potassium: 3.7 mEq/L (ref 3.5–5.1)
Sodium: 139 mEq/L (ref 136–145)
Total Bilirubin: <0.2 mg/dL (ref 0.20–1.20)
Total Protein: 7.1 g/dL (ref 6.4–8.3)

## 2013-04-05 MED ORDER — SODIUM CHLORIDE 0.9 % IV SOLN
800.0000 mg/m2 | Freq: Once | INTRAVENOUS | Status: AC
  Administered 2013-04-05: 1444 mg via INTRAVENOUS
  Filled 2013-04-05: qty 37.98

## 2013-04-05 MED ORDER — SODIUM CHLORIDE 0.9 % IV SOLN: 800.0000 mg/m2 | Freq: Once | INTRAVENOUS | Status: DC

## 2013-04-05 MED ORDER — PROCHLORPERAZINE MALEATE 10 MG PO TABS
ORAL_TABLET | ORAL | Status: AC
  Filled 2013-04-05: qty 1

## 2013-04-05 MED ORDER — SODIUM CHLORIDE 0.9 % IV SOLN
Freq: Once | INTRAVENOUS | Status: AC
  Administered 2013-04-05: 15:00:00 via INTRAVENOUS

## 2013-04-05 MED ORDER — PROCHLORPERAZINE MALEATE 10 MG PO TABS
10.0000 mg | ORAL_TABLET | Freq: Once | ORAL | Status: AC
  Administered 2013-04-05: 10 mg via ORAL

## 2013-04-05 MED ORDER — SODIUM CHLORIDE 0.9 % IV SOLN: 1000.0000 mg/m2 | Freq: Once | INTRAVENOUS | Status: DC

## 2013-04-05 NOTE — Telephone Encounter (Signed)
gv and printed papt sched na davs for pt for DEC and Jan...sed added tx.

## 2013-04-05 NOTE — Progress Notes (Addendum)
[facility] [phone]   [phone]  SHARED VISIT PROGRESS NOTE  [name], MD [address] [place] [address]  DIAGNOSIS: Recurrent non-small cell lung cancer initially diagnosed as stage IIA (T1 N1 MX) adenocarcinoma in April 2011.   PRIOR THERAPY:  1. Status post 3 cycles of neoadjuvant chemotherapy with carboplatin and Alimta. Last dose was given July 23, 2009. 2. Status post left lower lobectomy with lymph node dissection under the care of Dr. Edwyna Shell on October 17, 2009. 3. Status post concurrent chemoradiation with weekly carboplatin and paclitaxel for disease recurrence. Last dose was given May 07, 2010. 4. Status post 3 cycles of consolidation chemotherapy with carboplatin and Alimta. Last dose was given August 13, 2010. 5. Status post palliative radiotherapy to the right neck area. 6. Systemic chemotherapy with single agent Taxotere 75 mg/m2 every 3 weeks,, status post 6 cycles, last dose was given on 09/03/2011. 7. Systemic chemotherapy with carboplatin for an AUC of 5 and Alimta at 500 mg per meter square given every 3 weeks, status post 6 cycles. 8. Maintenance chemotherapy with single agent Alimta 500 mg/M2 every 3 weeks, status post 3 cycles discontinued today secondary to disease progression.   CURRENT THERAPY:  Systemic chemotherapy with single agent gemcitabine 1000 mg/M2 on days 1 and 8 every 3 weeks. First cycle on 03/15/2013.  CHEMOTHERAPY INTENT: Palliative  CURRENT # OF CHEMOTHERAPY CYCLES: 1  CURRENT ANTIEMETICS: Zofran, dexamethasone and Compazine  CURRENT SMOKING STATUS: Former smoker  ORAL CHEMOTHERAPY AND CONSENT: None  CURRENT BISPHOSPHONATES USE: None  PAIN MANAGEMENT: None  NARCOTICS INDUCED CONSTIPATION: None  LIVING WILL AND CODE STATUS: Full code initially but no longer term resuscitation.    INTERVAL HISTORY: Kaitlin Reed 62 y.o. female returns to the clinic today for followup  visit to proceed with cycle #2 of her systemic chemotherapy with single agent gemcitabine. She reports that the adrenal female mass and Sarna lotion have been helpful with her generalized itching and it is much improved. She complains of fatigue but voices no other specific complaints. She states that her annual mammogram is scheduled for 05/07/2012.  She had generalized itching and swelling after receiving Xgeva injection. She denied chest pain, shortness of breath, cough, fever, chills, nausea or vomiting. She's had no significant weight loss or night sweats.   MEDICAL HISTORY: Past Medical History  Diagnosis Date  . Heart murmur   . Tubal pregnancy   . lung ca dx'd 06/2009    chemo comp 07/2010; xrt comp 06/2010  . Hypercholesteremia     h/o    ALLERGIES:  is allergic to codeine; motrin; other; trazodone and nefazodone; banana; tape; and xgeva.  MEDICATIONS:  Current Outpatient Prescriptions  Medication Sig Dispense Refill  . Ascorbic Acid (VITAMIN C) 1000 MG tablet Take 1,000 mg by mouth daily.      Marland Kitchen aspirin EC 81 MG tablet Take 81 mg by mouth daily.      . calcium carbonate (OS-CAL) 600 MG TABS Take 600 mg by mouth 2 (two) times daily with a meal.      . Cyanocobalamin (B-12 PO) Take 1 tablet by mouth daily.      Marland Kitchen dexamethasone (DECADRON) 4 MG tablet one tablet by mouth twice a day the day before, day of and day after the chemotherapy  40 tablet  1  . folic acid (FOLVITE) 1 MG tablet TAKE 1 TABLET EVERY DAY  30 tablet  0  .  IRON COMBINATIONS PO Take 1 tablet by mouth daily.       Marland Kitchen LORazepam (ATIVAN) 0.5 MG tablet Take 1 tablet by mouth or sublingually every 8 hours as needed for nausea. May take one tablet at bedtime as needed for sleep  40 tablet  0  . metoprolol tartrate (LOPRESSOR) 25 MG tablet Take 25 mg by mouth 2 (two) times daily.      . Multiple Vitamin (MULITIVITAMIN WITH MINERALS) TABS Take 1 tablet by mouth daily.        . potassium chloride (MICRO-K) 10 MEQ CR capsule Take  5 mEq by mouth daily.       . prochlorperazine (COMPAZINE) 10 MG tablet TAKE 1 TABLET BY MOUTH EVERY 6 HOURS AS NEEDED.  30 tablet  1  . VITAMIN D, CHOLECALCIFEROL, PO Take 1 tablet by mouth daily.       Marland Kitchen VITAMIN E PO Take 1 tablet by mouth daily.      . methylPREDNISolone (MEDROL DOSEPAK) 4 MG tablet follow package directions  21 tablet  0  . [DISCONTINUED] Alum & Mag Hydroxide-Simeth (MAGIC MOUTHWASH W/LIDOCAINE) SOLN Take 10 mLs by mouth 4 (four) times daily as needed.  500 mL  5   No current facility-administered medications for this visit.    SURGICAL HISTORY:  Past Surgical History  Procedure Laterality Date  . Lung removal, partial  10/17/09    left. pt states they removed small piece of lung due to cancer  . 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; for milk tumor"    REVIEW OF SYSTEMS:  Constitutional: positive for fatigue Eyes: negative Ears, nose, mouth, throat, and face: negative Respiratory: negative Cardiovascular: negative Gastrointestinal: negative Genitourinary:negative Integument/breast: positive for pruritus Hematologic/lymphatic: negative Musculoskeletal:negative Neurological: negative Behavioral/Psych: negative Endocrine: negative Allergic/Immunologic: negative   PHYSICAL EXAMINATION: General appearance: alert, cooperative and no distress Head: Normocephalic, without obvious abnormality, atraumatic Neck: no adenopathy Lymph nodes: Cervical, supraclavicular, and axillary nodes normal. Resp: clear to auscultation bilaterally Cardio: regular rate and rhythm, S1, S2 normal, no murmur, click, rub or gallop GI: soft, non-tender; bowel sounds normal; no masses,  no organomegaly Extremities: extremities normal, atraumatic, no cyanosis or edema Neurologic: Alert and oriented X 3, normal strength and tone. Normal symmetric reflexes. Normal coordination and gait Skin: Resolution of the pain macular erythematous eruptions seen last week.   ECOG PERFORMANCE STATUS: 1 -  Symptomatic but completely ambulatory  Blood pressure 121/60, pulse 102, temperature 97.2 F (36.2 C), temperature source Oral, resp. rate 18, height 5\' 6"  (1.676 m), weight 151 lb 14.4 oz (68.901 kg), SpO2 100.00%.  LABORATORY DATA: Lab Results  Component Value Date   WBC 5.8 04/05/2013   HGB 10.9* 04/05/2013   HCT 32.5* 04/05/2013   MCV 88.6 04/05/2013   PLT 465* 04/05/2013      Chemistry      Component Value Date/Time   NA 139 04/05/2013 1339   NA 138 08/10/2012 1458   NA 138 11/06/2011 1121   K 3.7 04/05/2013 1339   K 3.8 08/10/2012 1458   K 4.3 11/06/2011 1121   CL 105 10/07/2012 1350   CL 100 08/10/2012 1458   CL 98 11/06/2011 1121   CO2 25 04/05/2013 1339   CO2 26 08/10/2012 1458   CO2 30 11/06/2011 1121   BUN 16.3 04/05/2013 1339   BUN 16 08/10/2012 1458   BUN 15 11/06/2011 1121   CREATININE 0.8 04/05/2013 1339   CREATININE 0.93 08/10/2012 1458   CREATININE 0.9  11/06/2011 1121      Component Value Date/Time   CALCIUM 9.1 04/05/2013 1339   CALCIUM 9.9 08/10/2012 1458   CALCIUM 9.0 11/06/2011 1121   ALKPHOS 304* 04/05/2013 1339   ALKPHOS 171* 08/10/2012 1458   ALKPHOS 110* 11/06/2011 1121   AST 17 04/05/2013 1339   AST 26 08/10/2012 1458   AST 36 11/06/2011 1121   ALT 7 04/05/2013 1339   ALT 12 08/10/2012 1458   ALT 23 11/06/2011 1121   BILITOT <0.20 04/05/2013 1339   BILITOT 0.3 08/10/2012 1458   BILITOT 0.50 11/06/2011 1121       RADIOGRAPHIC STUDIES: Ct Chest W Contrast  03/11/2013   CLINICAL DATA:  Restaging lung carcinoma. Chemotherapy and radiation therapy on going. History of lung resection.  EXAM: CT CHEST, ABDOMEN, AND PELVIS WITH CONTRAST  TECHNIQUE: Multidetector CT imaging of the chest, abdomen and pelvis was performed following the standard protocol during bolus administration of intravenous contrast.  CONTRAST:  100 cc Omnipaque  COMPARISON:  08/28/2012  FINDINGS:   CT CHEST FINDINGS  No axillary or supraclavicular lymphadenopathy. Interval enlargement of a prevascular  lymph node anterior to the brachiocephalic vein measuring 15 mm compared to 9 mm on prior. Right lower paratracheal lymph node measures 30 mm increased from 8 mm on prior. Small pericardial effusion is unchanged. Esophagus is normal.  There is volume loss in the left hemi thorax from prior lung resection. There is a consolidative pattern in the superior right infrahilar region consists with radiation change. There is a new nodule within the medial lingula measuring 12 mm (image 31). Right lung is clear.    CT ABDOMEN AND PELVIS FINDINGS  No focal hepatic lesion. The gallbladder, pancreas, spleen, kidneys are normal. There is bilateral thickening of the adrenal glands which is advanced compared to prior. Masslike lesion in right adrenal gland now measuring 23 mm (image 58) compared to normal 7 mm gland on prior.  The stomach, small bowel, and colon are unremarkable.  Abdominal aorta is normal caliber. There is a new retroperitoneal lymph node between the IVC and aorta measures 15 mm which enlarged from 6 mm on prior (image 64). No free fluid the pelvis. The uterus and bladder normal. No peritoneal disease. No pelvic lymphadenopathy.  Review of the bone windows interval change in the bone metastasis pattern. Individual discrete discrete sclerotic lesions in the left superior acetabulum are now replaced by a diffuse lytic process. Some of these lesions extend through cortex (image 06). The posterior aspect of the left iliac bone has poor cortical definition were previously there was a dense sclerotic lesion. There is increased in size of sclerotic lesions in lower lumbar spine.    IMPRESSION: 1. Progression of lung cancer metastasis with enlarged mediastinal lymph nodes and a new lingular nodule.  2. Progression of metastasis in the abdomen with a new right adrenal gland metastasis and new retroperitoneal periaortic lymphadenopathy.  3. Progression of skeletal metastasis with loss of cortical definition at sites of  previous sclerotic bone lesions and enlargement of additional bone lesions.   Electronically Signed   By: Genevive Bi M.D.   On: 03/11/2013 09:20   ASSESSMENT AND PLAN: This is a very pleasant 62 years old [place] American female with recurrent non-small cell lung cancer. Unfortunately she has evidence for disease progression especially in the mediastinal lymph nodes as well as new liver lesions and abdomen lymphadenopathy. She's now being treated with single agent gemcitabine 1000 mg per meter squared on days 1  and 8 every 3 weeks, status post day 1 of cycle 1. Day 8 of cycle 1 was held due to neutropenia as well as her adverse reaction to the Xgeva injection. Patient was discussed with also seen by Dr. Arbutus Ped. She is to continue the use the LV no female mass as well as Sarna lotion as needed for to write his. Patient may also take Benadryl at bedtime as needed to address him complaints of itching. She'll proceed with cycle #2 of her dosed reduced systemic chemotherapy with gemcitabine now at 800 mg per meter squared as scheduled today. Her ANC is right at our cutoff of 1.5 and we will closely monitor her counts next week as he should be go forward with day 8 of cycle 2. She'll followup in 3 weeks prior to start of cycle #3 with repeat CBC differential and C. met.  Laural Benes, Gilad Dugger E, PA-C   She was advised to call immediately if she has any concerning symptoms in the interval.  The patient voices understanding of current disease status and treatment options and is in agreement with the current care plan.  All questions were answered. The patient knows to call the clinic with any problems, questions or concerns. We can certainly see the patient much sooner if necessary.  ADDENDUM: Hematology/Oncology Attending: I had a face to face encounter with the patient. I recommended her care plan. The patient is feeling fine today with no specific complaints. She tolerated the last cycle of her treatment  fairly well but she missed day 8 because of neutropenia. She will start cycle #2 today as scheduled. The patient would come back for follow up visit in 3 weeks with the start of cycle #3. She was advised to call immediately if she has any concerning symptoms in the interval. Lajuana Matte., MD 04/06/2013

## 2013-04-05 NOTE — Patient Instructions (Signed)
[facility] Discharge Instructions for Patients Receiving Chemotherapy  Today you received the following chemotherapy agents: gemzar  To help prevent nausea and vomiting after your treatment, we encourage you to take your nausea medication.  Take it as often as prescribed.     If you develop nausea and vomiting that is not controlled by your nausea medication, call the clinic. If it is after clinic hours your family physician or the after hours number for the clinic or go to the Emergency Department.   BELOW ARE SYMPTOMS THAT SHOULD BE REPORTED IMMEDIATELY:  *FEVER GREATER THAN 100.5 F  *CHILLS WITH OR WITHOUT FEVER  NAUSEA AND VOMITING THAT IS NOT CONTROLLED WITH YOUR NAUSEA MEDICATION  *UNUSUAL SHORTNESS OF BREATH  *UNUSUAL BRUISING OR BLEEDING  TENDERNESS IN MOUTH AND THROAT WITH OR WITHOUT PRESENCE OF ULCERS  *URINARY PROBLEMS  *BOWEL PROBLEMS  UNUSUAL RASH Items with * indicate a potential emergency and should be followed up as soon as possible.  Feel free to call the clinic you have any questions or concerns. The clinic phone number is [phone].   I have been informed and understand all the instructions given to me. I know to contact the clinic, my physician, or go to the Emergency Department if any problems should occur. I do not have any questions at this time, but understand that I may call the clinic during office hours   should I have any questions or need assistance in obtaining follow up care.    __________________________________________  _____________  __________ Signature of Patient or Authorized Representative            Date                   Time    __________________________________________ Nurse's Signature    

## 2013-04-06 ENCOUNTER — Ambulatory Visit
Admission: RE | Admit: 2013-04-06 | Discharge: 2013-04-06 | Disposition: A | Discharge status: Home / Self Care | Source: Ambulatory Visit

## 2013-04-06 DIAGNOSIS — Z1231 Encounter for screening mammogram for malignant neoplasm of breast: Secondary | ICD-10-CM

## 2013-04-06 NOTE — Patient Instructions (Signed)
Continue with lab and chemotherapy as scheduled Followup in 3 weeks prior to the start of her next scheduled cycle of chemotherapy

## 2013-04-12 ENCOUNTER — Encounter (INDEPENDENT_AMBULATORY_CARE_PROVIDER_SITE_OTHER): Payer: Self-pay

## 2013-04-12 ENCOUNTER — Ambulatory Visit

## 2013-04-12 ENCOUNTER — Other Ambulatory Visit (HOSPITAL_BASED_OUTPATIENT_CLINIC_OR_DEPARTMENT_OTHER)

## 2013-04-12 DIAGNOSIS — C3430 Malignant neoplasm of lower lobe, unspecified bronchus or lung: Secondary | ICD-10-CM

## 2013-04-12 DIAGNOSIS — C3490 Malignant neoplasm of unspecified part of unspecified bronchus or lung: Secondary | ICD-10-CM

## 2013-04-12 LAB — CBC WITH DIFFERENTIAL/PLATELET
BASO%: 0.4 % (ref 0.0–2.0)
Basophils Absolute: 0 10*3/uL (ref 0.0–0.1)
Eosinophils Absolute: 0.2 10*3/uL (ref 0.0–0.5)
HCT: 30.3 % — ABNORMAL LOW (ref 34.8–46.6)
HGB: 10.4 g/dL — ABNORMAL LOW (ref 11.6–15.9)
LYMPH%: 47.9 % (ref 14.0–49.7)
MCHC: 34.3 g/dL (ref 31.5–36.0)
MCV: 87.6 fL (ref 79.5–101.0)
MONO#: 0.2 10*3/uL (ref 0.1–0.9)
NEUT#: 0.9 10*3/uL — ABNORMAL LOW (ref 1.5–6.5)
NEUT%: 37.5 % — ABNORMAL LOW (ref 38.4–76.8)
Platelets: 244 10*3/uL (ref 145–400)
WBC: 2.4 10*3/uL — ABNORMAL LOW (ref 3.9–10.3)

## 2013-04-12 NOTE — Progress Notes (Signed)
Patient's ANC 0.9 today. Dr. Arbutus Ped notified and he stated patient cannot receive treatment today and for her to return as scheduled. No new orders given. Patient given copy of schedule and agreeable to not receiving treatment today. Told to call [facility] with any questions or problems that arise before next appointment. Patient educated on the importance of handwashing and staying away from sick individuals as much as possible since ANC so low. Angelena Form, RN

## 2013-04-13 ENCOUNTER — Ambulatory Visit

## 2013-04-19 ENCOUNTER — Other Ambulatory Visit (HOSPITAL_BASED_OUTPATIENT_CLINIC_OR_DEPARTMENT_OTHER)

## 2013-04-19 DIAGNOSIS — C3430 Malignant neoplasm of lower lobe, unspecified bronchus or lung: Secondary | ICD-10-CM

## 2013-04-19 DIAGNOSIS — D709 Neutropenia, unspecified: Secondary | ICD-10-CM

## 2013-04-19 LAB — CBC WITH DIFFERENTIAL/PLATELET
BASO%: 0.5 % (ref 0.0–2.0)
Basophils Absolute: 0 10*3/uL (ref 0.0–0.1)
EOS%: 3.3 % (ref 0.0–7.0)
HCT: 33.3 % — ABNORMAL LOW (ref 34.8–46.6)
HGB: 11.1 g/dL — ABNORMAL LOW (ref 11.6–15.9)
LYMPH%: 21 % (ref 14.0–49.7)
MCH: 30.3 pg (ref 25.1–34.0)
MCHC: 33.2 g/dL (ref 31.5–36.0)
MCV: 91.1 fL (ref 79.5–101.0)
MONO%: 13.1 % (ref 0.0–14.0)
NEUT%: 62.1 % (ref 38.4–76.8)
Platelets: 399 10*3/uL (ref 145–400)
RBC: 3.65 10*6/uL — ABNORMAL LOW (ref 3.70–5.45)
WBC: 6.4 10*3/uL (ref 3.9–10.3)
lymph#: 1.3 10*3/uL (ref 0.9–3.3)

## 2013-04-19 LAB — COMPREHENSIVE METABOLIC PANEL (CC13)
AST: 21 U/L (ref 5–34)
Albumin: 3.7 g/dL (ref 3.5–5.0)
BUN: 20.2 mg/dL (ref 7.0–26.0)
Calcium: 10.3 mg/dL (ref 8.4–10.4)
Chloride: 107 mEq/L (ref 98–109)
Creatinine: 0.9 mg/dL (ref 0.6–1.1)
Potassium: 4.5 mEq/L (ref 3.5–5.1)
Total Bilirubin: 0.2 mg/dL (ref 0.20–1.20)

## 2013-04-26 ENCOUNTER — Ambulatory Visit (HOSPITAL_BASED_OUTPATIENT_CLINIC_OR_DEPARTMENT_OTHER): Admitting: Physician Assistant

## 2013-04-26 ENCOUNTER — Encounter (INDEPENDENT_AMBULATORY_CARE_PROVIDER_SITE_OTHER): Payer: Self-pay

## 2013-04-26 ENCOUNTER — Encounter: Payer: Self-pay | Admitting: Physician Assistant

## 2013-04-26 ENCOUNTER — Ambulatory Visit (HOSPITAL_BASED_OUTPATIENT_CLINIC_OR_DEPARTMENT_OTHER)

## 2013-04-26 ENCOUNTER — Other Ambulatory Visit (HOSPITAL_BASED_OUTPATIENT_CLINIC_OR_DEPARTMENT_OTHER)

## 2013-04-26 ENCOUNTER — Encounter: Payer: Self-pay | Admitting: Internal Medicine

## 2013-04-26 ENCOUNTER — Telehealth: Payer: Self-pay | Admitting: Internal Medicine

## 2013-04-26 VITALS — BP 101/64 | HR 102 | Temp 97.0°F | Resp 18 | Ht 66.0 in | Wt 150.0 lb

## 2013-04-26 DIAGNOSIS — C3491 Malignant neoplasm of unspecified part of right bronchus or lung: Secondary | ICD-10-CM

## 2013-04-26 DIAGNOSIS — C771 Secondary and unspecified malignant neoplasm of intrathoracic lymph nodes: Secondary | ICD-10-CM

## 2013-04-26 DIAGNOSIS — C787 Secondary malignant neoplasm of liver and intrahepatic bile duct: Secondary | ICD-10-CM

## 2013-04-26 DIAGNOSIS — C3490 Malignant neoplasm of unspecified part of unspecified bronchus or lung: Secondary | ICD-10-CM

## 2013-04-26 DIAGNOSIS — C3430 Malignant neoplasm of lower lobe, unspecified bronchus or lung: Secondary | ICD-10-CM

## 2013-04-26 DIAGNOSIS — C770 Secondary and unspecified malignant neoplasm of lymph nodes of head, face and neck: Secondary | ICD-10-CM

## 2013-04-26 DIAGNOSIS — C7951 Secondary malignant neoplasm of bone: Secondary | ICD-10-CM

## 2013-04-26 DIAGNOSIS — Z5111 Encounter for antineoplastic chemotherapy: Secondary | ICD-10-CM

## 2013-04-26 LAB — CBC WITH DIFFERENTIAL/PLATELET
BASO%: 0.3 % (ref 0.0–2.0)
Basophils Absolute: 0 10*3/uL (ref 0.0–0.1)
Eosinophils Absolute: 0.2 10*3/uL (ref 0.0–0.5)
HGB: 10.6 g/dL — ABNORMAL LOW (ref 11.6–15.9)
MCH: 29.6 pg (ref 25.1–34.0)
MCV: 90.2 fL (ref 79.5–101.0)
MONO#: 0.5 10*3/uL (ref 0.1–0.9)
MONO%: 12.3 % (ref 0.0–14.0)
NEUT#: 1.8 10*3/uL (ref 1.5–6.5)
RDW: 15.1 % — ABNORMAL HIGH (ref 11.2–14.5)
WBC: 3.7 10*3/uL — ABNORMAL LOW (ref 3.9–10.3)
lymph#: 1.3 10*3/uL (ref 0.9–3.3)

## 2013-04-26 LAB — COMPREHENSIVE METABOLIC PANEL
ALT: <5 U/L (ref 0–35)
AST: 16 U/L (ref 0–37)
Alkaline Phosphatase: 238 U/L — ABNORMAL HIGH (ref 39–117)
BUN: 14 mg/dL (ref 6–23)
Calcium: 8.2 mg/dL — ABNORMAL LOW (ref 8.4–10.5)
Creatinine, Ser: 0.87 mg/dL (ref 0.50–1.10)
Sodium: 137 mEq/L (ref 135–145)
Total Bilirubin: 0.2 mg/dL — ABNORMAL LOW (ref 0.3–1.2)

## 2013-04-26 MED ORDER — PROCHLORPERAZINE MALEATE 10 MG PO TABS
ORAL_TABLET | ORAL | Status: AC
  Filled 2013-04-26: qty 1

## 2013-04-26 MED ORDER — PROCHLORPERAZINE MALEATE 10 MG PO TABS
10.0000 mg | ORAL_TABLET | Freq: Once | ORAL | Status: AC
  Administered 2013-04-26: 10 mg via ORAL

## 2013-04-26 MED ORDER — GEMCITABINE HCL CHEMO INJECTION 1 GM/26.3ML
800.0000 mg/m2 | Freq: Once | INTRAVENOUS | Status: AC
  Administered 2013-04-26: 1444 mg via INTRAVENOUS
  Filled 2013-04-26: qty 38

## 2013-04-26 MED ORDER — SODIUM CHLORIDE 0.9 % IV SOLN
Freq: Once | INTRAVENOUS | Status: AC
  Administered 2013-04-26: 17:00:00 via INTRAVENOUS

## 2013-04-26 NOTE — Patient Instructions (Signed)
[facility] Discharge Instructions for Patients Receiving Chemotherapy  Today you received the following chemotherapy agents :  Gemcitabine.  To help prevent nausea and vomiting after your treatment, we encourage you to take your nausea medication as instructed by your physician.   If you develop nausea and vomiting that is not controlled by your nausea medication, call the clinic.   BELOW ARE SYMPTOMS THAT SHOULD BE REPORTED IMMEDIATELY:  *FEVER GREATER THAN 100.5 F  *CHILLS WITH OR WITHOUT FEVER  NAUSEA AND VOMITING THAT IS NOT CONTROLLED WITH YOUR NAUSEA MEDICATION  *UNUSUAL SHORTNESS OF BREATH  *UNUSUAL BRUISING OR BLEEDING  TENDERNESS IN MOUTH AND THROAT WITH OR WITHOUT PRESENCE OF ULCERS  *URINARY PROBLEMS  *BOWEL PROBLEMS  UNUSUAL RASH Items with * indicate a potential emergency and should be followed up as soon as possible.  Feel free to call the clinic you have any questions or concerns. The clinic phone number is [phone].    

## 2013-04-26 NOTE — Progress Notes (Addendum)
[facility] [phone]   [phone]  SHARED VISIT PROGRESS NOTE  [name], MD [address] [place] [address]  DIAGNOSIS: Recurrent non-small cell lung cancer initially diagnosed as stage IIA (T1 N1 MX) adenocarcinoma in April 2011.   PRIOR THERAPY:  1. Status post 3 cycles of neoadjuvant chemotherapy with carboplatin and Alimta. Last dose was given July 23, 2009. 2. Status post left lower lobectomy with lymph node dissection under the care of Dr. Edwyna Shell on October 17, 2009. 3. Status post concurrent chemoradiation with weekly carboplatin and paclitaxel for disease recurrence. Last dose was given May 07, 2010. 4. Status post 3 cycles of consolidation chemotherapy with carboplatin and Alimta. Last dose was given August 13, 2010. 5. Status post palliative radiotherapy to the right neck area. 6. Systemic chemotherapy with single agent Taxotere 75 mg/m2 every 3 weeks,, status post 6 cycles, last dose was given on 09/03/2011. 7. Systemic chemotherapy with carboplatin for an AUC of 5 and Alimta at 500 mg per meter square given every 3 weeks, status post 6 cycles. 8. Maintenance chemotherapy with single agent Alimta 500 mg/M2 every 3 weeks, status post 3 cycles discontinued today secondary to disease progression.   CURRENT THERAPY:  Systemic chemotherapy with single agent gemcitabine 1000 mg/M2 on days 1 and 8 every 3 weeks. First cycle on 03/15/2013. Status post 2 cycles. Of note the patient has missed day 8 of both cycle #1 of cycle #2. Her gemcitabine has been decreased to 800 mg per meter squared given on days 1 and 8 every 3 weeks starting with cycle #2  CHEMOTHERAPY INTENT: Palliative  CURRENT # OF CHEMOTHERAPY CYCLES: 3  CURRENT ANTIEMETICS: Zofran, dexamethasone and Compazine  CURRENT SMOKING STATUS: Former smoker  ORAL CHEMOTHERAPY AND CONSENT: None  CURRENT BISPHOSPHONATES USE: None  PAIN MANAGEMENT: None  NARCOTICS  INDUCED CONSTIPATION: None  LIVING WILL AND CODE STATUS: Full code initially but no longer term resuscitation.    INTERVAL HISTORY: Kaitlin Reed 62 y.o. female returns to the clinic today for followup visit to proceed with cycle #3 of her systemic chemotherapy with single agent gemcitabine. She reports that the [employer] and Sarna lotion have been helpful with her generalized itching and it is now resolved. She complains of fatigue but voices no other specific complaints. She reports that she has been taking 3 over-the-counter 65 mg iron tablets daily in an effort to "increase her T cells". She's had some constipation as well as some dark colored stools.  She had generalized itching and swelling after receiving Xgeva injection. She denied chest pain, shortness of breath, cough, fever, chills, nausea or vomiting. She's had no significant weight loss or night sweats.   MEDICAL HISTORY: Past Medical History  Diagnosis Date  . Heart murmur   . Tubal pregnancy   . lung ca dx'd 06/2009    chemo comp 07/2010; xrt comp 06/2010  . Hypercholesteremia     h/o    ALLERGIES:  is allergic to codeine; motrin; other; trazodone and nefazodone; banana; tape; and xgeva.  MEDICATIONS:  Current Outpatient Prescriptions  Medication Sig Dispense Refill  . Ascorbic Acid (VITAMIN C) 1000 MG tablet Take 1,000 mg by mouth daily.      Marland Kitchen aspirin EC 81 MG tablet Take 81 mg by mouth daily.      . calcium carbonate (OS-CAL) 600 MG TABS Take 600 mg by mouth 2 (two) times daily with a meal.      .  Cyanocobalamin (B-12 PO) Take 1 tablet by mouth daily.      . folic acid (FOLVITE) 1 MG tablet TAKE 1 TABLET EVERY DAY  30 tablet  0  . IRON COMBINATIONS PO Take 1 tablet by mouth daily.       Marland Kitchen LORazepam (ATIVAN) 0.5 MG tablet Take 1 tablet by mouth or sublingually every 8 hours as needed for nausea. May take one tablet at bedtime as needed for sleep  40 tablet  0  . metoprolol tartrate (LOPRESSOR) 25 MG tablet  Take 25 mg by mouth 2 (two) times daily.      . Multiple Vitamin (MULITIVITAMIN WITH MINERALS) TABS Take 1 tablet by mouth daily.        . potassium chloride (MICRO-K) 10 MEQ CR capsule Take 5 mEq by mouth daily.       . prochlorperazine (COMPAZINE) 10 MG tablet TAKE 1 TABLET BY MOUTH EVERY 6 HOURS AS NEEDED.  30 tablet  1  . VITAMIN D, CHOLECALCIFEROL, PO Take 1 tablet by mouth daily.       Marland Kitchen VITAMIN Reed PO Take 1 tablet by mouth daily.      Marland Kitchen dexamethasone (DECADRON) 4 MG tablet one tablet by mouth twice a day the day before, day of and day after the chemotherapy  40 tablet  1  . methylPREDNISolone (MEDROL DOSEPAK) 4 MG tablet follow package directions  21 tablet  0  . [DISCONTINUED] Alum & Mag Hydroxide-Simeth (MAGIC MOUTHWASH W/LIDOCAINE) SOLN Take 10 mLs by mouth 4 (four) times daily as needed.  500 mL  5   No current facility-administered medications for this visit.   Facility-Administered Medications Ordered in Other Visits  Medication Dose Route Frequency Provider Last Rate Last Dose  . Gemcitabine HCl (GEMZAR) 1,444 mg in sodium chloride 0.9 % 250 mL chemo infusion  800 mg/m2 (Treatment Plan Actual) Intravenous Once Conni Slipper, PA-C 576 mL/hr at 04/26/13 1706 1,444 mg at 04/26/13 1706    SURGICAL HISTORY:  Past Surgical History  Procedure Laterality Date  . Lung removal, partial  10/17/09    left. pt states they removed small piece of lung due to cancer  . 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; for milk tumor"    REVIEW OF SYSTEMS:  Constitutional: positive for fatigue Eyes: negative Ears, nose, mouth, throat, and face: negative Respiratory: negative Cardiovascular: negative Gastrointestinal: positive for constipation Genitourinary:negative Integument/breast: negative Hematologic/lymphatic: negative Musculoskeletal:negative Neurological: negative Behavioral/Psych: negative Endocrine: negative Allergic/Immunologic: negative   PHYSICAL EXAMINATION: General appearance:  alert, cooperative and no distress Head: Normocephalic, without obvious abnormality, atraumatic Neck: no adenopathy Lymph nodes: Cervical, supraclavicular, and axillary nodes normal. Resp: clear to auscultation bilaterally Cardio: regular rate and rhythm, S1, S2 normal, no murmur, click, rub or gallop GI: soft, non-tender; bowel sounds normal; no masses,  no organomegaly Extremities: extremities normal, atraumatic, no cyanosis or edema Neurologic: Alert and oriented X 3, normal strength and tone. Normal symmetric reflexes. Normal coordination and gait Skin: no rashes or lesions  ECOG PERFORMANCE STATUS: 1 - Symptomatic but completely ambulatory  Blood pressure 101/64, pulse 102, temperature 97 F (36.1 C), temperature source Oral, resp. rate 18, height 5\' 6"  (1.676 m), weight 150 lb (68.04 kg).  LABORATORY DATA: Lab Results  Component Value Date   WBC 3.7* 04/26/2013   HGB 10.6* 04/26/2013   HCT 32.3* 04/26/2013   MCV 90.2 04/26/2013   PLT 534* 04/26/2013      Chemistry      Component Value  Date/Time   NA 143 04/19/2013 1558   NA 138 08/10/2012 1458   NA 138 11/06/2011 1121   K 4.5 04/19/2013 1558   K 3.8 08/10/2012 1458   K 4.3 11/06/2011 1121   CL 105 10/07/2012 1350   CL 100 08/10/2012 1458   CL 98 11/06/2011 1121   CO2 25 04/19/2013 1558   CO2 26 08/10/2012 1458   CO2 30 11/06/2011 1121   BUN 20.2 04/19/2013 1558   BUN 16 08/10/2012 1458   BUN 15 11/06/2011 1121   CREATININE 0.9 04/19/2013 1558   CREATININE 0.93 08/10/2012 1458   CREATININE 0.9 11/06/2011 1121      Component Value Date/Time   CALCIUM 10.3 04/19/2013 1558   CALCIUM 9.9 08/10/2012 1458   CALCIUM 9.0 11/06/2011 1121   ALKPHOS 271* 04/19/2013 1558   ALKPHOS 171* 08/10/2012 1458   ALKPHOS 110* 11/06/2011 1121   AST 21 04/19/2013 1558   AST 26 08/10/2012 1458   AST 36 11/06/2011 1121   ALT 8 04/19/2013 1558   ALT 12 08/10/2012 1458   ALT 23 11/06/2011 1121   BILITOT 0.20 04/19/2013 1558   BILITOT 0.3 08/10/2012  1458   BILITOT 0.50 11/06/2011 1121       RADIOGRAPHIC STUDIES: Ct Chest W Contrast  03/11/2013   CLINICAL DATA:  Restaging lung carcinoma. Chemotherapy and radiation therapy on going. History of lung resection.  EXAM: CT CHEST, ABDOMEN, AND PELVIS WITH CONTRAST  TECHNIQUE: Multidetector CT imaging of the chest, abdomen and pelvis was performed following the standard protocol during bolus administration of intravenous contrast.  CONTRAST:  100 cc Omnipaque  COMPARISON:  08/28/2012  FINDINGS:   CT CHEST FINDINGS  No axillary or supraclavicular lymphadenopathy. Interval enlargement of a prevascular lymph node anterior to the brachiocephalic vein measuring 15 mm compared to 9 mm on prior. Right lower paratracheal lymph node measures 30 mm increased from 8 mm on prior. Small pericardial effusion is unchanged. Esophagus is normal.  There is volume loss in the left hemi thorax from prior lung resection. There is a consolidative pattern in the superior right infrahilar region consists with radiation change. There is a new nodule within the medial lingula measuring 12 mm (image 31). Right lung is clear.    CT ABDOMEN AND PELVIS FINDINGS  No focal hepatic lesion. The gallbladder, pancreas, spleen, kidneys are normal. There is bilateral thickening of the adrenal glands which is advanced compared to prior. Masslike lesion in right adrenal gland now measuring 23 mm (image 58) compared to normal 7 mm gland on prior.  The stomach, small bowel, and colon are unremarkable.  Abdominal aorta is normal caliber. There is a new retroperitoneal lymph node between the IVC and aorta measures 15 mm which enlarged from 6 mm on prior (image 64). No free fluid the pelvis. The uterus and bladder normal. No peritoneal disease. No pelvic lymphadenopathy.  Review of the bone windows interval change in the bone metastasis pattern. Individual discrete discrete sclerotic lesions in the left superior acetabulum are now replaced by a  diffuse lytic process. Some of these lesions extend through cortex (image 06). The posterior aspect of the left iliac bone has poor cortical definition were previously there was a dense sclerotic lesion. There is increased in size of sclerotic lesions in lower lumbar spine.    IMPRESSION: 1. Progression of lung cancer metastasis with enlarged mediastinal lymph nodes and a new lingular nodule.  2. Progression of metastasis in the abdomen with a new right adrenal  gland metastasis and new retroperitoneal periaortic lymphadenopathy.  3. Progression of skeletal metastasis with loss of cortical definition at sites of previous sclerotic bone lesions and enlargement of additional bone lesions.   Electronically Signed   By: Genevive Bi M.D.   On: 03/11/2013 09:20   ASSESSMENT AND PLAN: This is a very pleasant 62 years old [place] American female with recurrent non-small cell lung cancer. Unfortunately she has evidence for disease progression especially in the mediastinal lymph nodes as well as new liver lesions and abdomen lymphadenopathy. She's now being treated with single agent gemcitabine 1000 mg per meter squared on days 1 and 8 every 3 weeks, status post day 1 of cycle 1. Day 8 of cycle 1 and cycle 2 was held due to neutropenia. Her gemcitabine was dose reduced to 800 mg per meter square given on days 1 and 8 starting with cycle #2. Patient was discussed with him also seen by Dr. Arbutus Ped. She'll proceed with cycle #3 of her systemic chemotherapy with single agent dose reduced gemcitabine. For her bone metastasis patient will be started on Zometa infusions on a monthly basis. The patient like to wait until next week to start this particular treatment. She will continue with labs as scheduled. She'll followup with Dr. Arbutus Ped in 3 weeks with restaging CT scan of her head, neck, chest, abdomen and pelvis with contrast to reevaluate her disease. Marland Kitchen  Kaitlin Reed, Kaitlin Reinoso E, PA-C   She was advised to call immediately  if she has any concerning symptoms in the interval.  The patient voices understanding of current disease status and treatment options and is in agreement with the current care plan.  All questions were answered. The patient knows to call the clinic with any problems, questions or concerns. We can certainly see the patient much sooner if necessary.  ADDENDUM: Hematology/Oncology Attending: I had a face to face encounter with the patient. I recommended her care plan. This is a very pleasant 62 years old [place] American female with recurrent non-small cell lung cancer, adenocarcinoma status post several chemotherapy regimens as well as radiation in the past and she is currently on treatment with single agent gemcitabine status post 2 cycles. She is tolerating her treatment fairly well except for neutropenia and she missed day 8 of the second cycle. I recommended for the patient to proceed with her treatment today with the third cycle of her chemotherapy. She would come back for follow up visit in 3 weeks with repeat CT scan of the neck,chest, abdomen and pelvis for restaging of her disease. For bone disease the patient had allergic reaction to [facility]. I will change her treatment to Zometa 4 mg IV every 4 weeks. The patient was advised to call immediately if she has any concerning symptoms in the interval. Lajuana Matte., MD 04/27/2013

## 2013-04-26 NOTE — Telephone Encounter (Signed)
Gave pt appt for lab and Md , emailed Michelle regfarding chemo, gave pt oral contrast for CT

## 2013-04-27 ENCOUNTER — Other Ambulatory Visit: Payer: Self-pay | Admitting: *Deleted

## 2013-04-27 ENCOUNTER — Telehealth: Payer: Self-pay | Admitting: *Deleted

## 2013-04-27 DIAGNOSIS — C3490 Malignant neoplasm of unspecified part of unspecified bronchus or lung: Secondary | ICD-10-CM

## 2013-04-27 MED ORDER — LIDOCAINE-PRILOCAINE 2.5-2.5 % EX CREA: 1.0000 "application " | TOPICAL_CREAM | CUTANEOUS | Status: DC | PRN

## 2013-04-27 NOTE — Progress Notes (Signed)
Pt is requesting to have port placed.  Educated pt regarding EMLA cream.  SLJ

## 2013-04-27 NOTE — Patient Instructions (Signed)
Continue with labs and chemotherapy as scheduled Followup with Dr. Arbutus Ped in 3 weeks with restaging CT scans of your head, neck, chest, abdomen and pelvis to reevaluate your disease Decrease your over-the-counter iron supplement to one tablet by mouth daily and use a stool softener to help alleviate the constipation related to the oral iron.

## 2013-04-27 NOTE — Telephone Encounter (Signed)
Per staff message and POF I have scheduled appts.  JMW  

## 2013-05-03 ENCOUNTER — Telehealth: Payer: Self-pay | Admitting: Internal Medicine

## 2013-05-03 ENCOUNTER — Encounter (INDEPENDENT_AMBULATORY_CARE_PROVIDER_SITE_OTHER): Payer: Self-pay

## 2013-05-03 ENCOUNTER — Other Ambulatory Visit (HOSPITAL_BASED_OUTPATIENT_CLINIC_OR_DEPARTMENT_OTHER)

## 2013-05-03 ENCOUNTER — Ambulatory Visit

## 2013-05-03 DIAGNOSIS — C3490 Malignant neoplasm of unspecified part of unspecified bronchus or lung: Secondary | ICD-10-CM

## 2013-05-03 DIAGNOSIS — C3430 Malignant neoplasm of lower lobe, unspecified bronchus or lung: Secondary | ICD-10-CM

## 2013-05-03 LAB — CBC WITH DIFFERENTIAL/PLATELET
BASO%: 0.7 % (ref 0.0–2.0)
Basophils Absolute: 0 10*3/uL (ref 0.0–0.1)
EOS ABS: 0.1 10*3/uL (ref 0.0–0.5)
EOS%: 3.4 % (ref 0.0–7.0)
HCT: 32.6 % — ABNORMAL LOW (ref 34.8–46.6)
HGB: 10.9 g/dL — ABNORMAL LOW (ref 11.6–15.9)
LYMPH%: 54.1 % — ABNORMAL HIGH (ref 14.0–49.7)
MCH: 29.7 pg (ref 25.1–34.0)
MCHC: 33.4 g/dL (ref 31.5–36.0)
MCV: 88.8 fL (ref 79.5–101.0)
MONO#: 0.2 10*3/uL (ref 0.1–0.9)
MONO%: 10.8 % (ref 0.0–14.0)
NEUT#: 0.5 10*3/uL — CL (ref 1.5–6.5)
NEUT%: 31 % — ABNORMAL LOW (ref 38.4–76.8)
NRBC: 0 % (ref 0–0)
Platelets: 248 10*3/uL (ref 145–400)
RBC: 3.67 10*6/uL — AB (ref 3.70–5.45)
RDW: 14.3 % (ref 11.2–14.5)
WBC: 1.5 10*3/uL — ABNORMAL LOW (ref 3.9–10.3)
lymph#: 0.8 10*3/uL — ABNORMAL LOW (ref 0.9–3.3)

## 2013-05-03 NOTE — Progress Notes (Signed)
ANC 0.5 hold treatment today per Dr. Julien Nordmann

## 2013-05-03 NOTE — Patient Instructions (Signed)
Neutropenia Neutropenia is a condition that occurs when the level of a certain type of white blood cell (neutrophil) in your body becomes lower than normal. Neutrophils are made in the bone marrow and fight infections. These cells protect against bacteria and viruses. The fewer neutrophils you have, and the longer your body remains without them, the greater your risk of getting a severe infection becomes. CAUSES  The cause of neutropenia may be hard to determine. However, it is usually due to 3 main problems:   Decreased production of neutrophils. This may be due to:  Certain medicines such as chemotherapy.  Genetic problems.  Cancer.  Radiation treatments.  Vitamin deficiency.  Some pesticides.  Increased destruction of neutrophils. This may be due to:  Overwhelming infections.  Hemolytic anemia. This is when the body destroys its own blood cells.  Chemotherapy.  Neutrophils moving to areas of the body where they cannot fight infections. This may be due to:  Dialysis procedures.  Conditions where the spleen becomes enlarged. Neutrophils are held in the spleen and are not available to the rest of the body.  Overwhelming infections. The neutrophils are held in the area of the infection and are not available to the rest of the body. SYMPTOMS  There are no specific symptoms of neutropenia. The lack of neutrophils can result in an infection, and an infection can cause various problems. DIAGNOSIS  Diagnosis is made by a blood test. A complete blood count is performed. The normal level of neutrophils in human blood differs with age and race. Infants have lower counts than older children and adults. African Americans have lower counts than Caucasians or Asians. The average adult level is 1500 cells/mm3 of blood. Neutrophil counts are interpreted as follows:  Greater than 1000 cells/mm3 gives normal protection against infection.  500 to 1000 cells/mm3 gives an increased risk for  infection.  200 to 500 cells/mm3 is a greater risk for severe infection.  Lower than 200 cells/mm3 is a marked risk of infection. This may require hospitalization and treatment with antibiotic medicines. TREATMENT  Treatment depends on the underlying cause, severity, and presence of infections or symptoms. It also depends on your health. Your caregiver will discuss the treatment plan with you. Mild cases are often easily treated and have a good outcome. Preventative measures may also be started to limit your risk of infections. Treatment can include:  Taking antibiotics.  Stopping medicines that are known to cause neutropenia.  Correcting nutritional deficiencies by eating green vegetables to supply folic acid and taking vitamin B supplements.  Stopping exposure to pesticides if your neutropenia is related to pesticide exposure.  Taking a blood growth factor called sargramostim, pegfilgrastim, or filgrastim if you are undergoing chemotherapy for cancer. This stimulates white blood cell production.  Removal of the spleen if you have Felty's syndrome and have repeated infections. HOME CARE INSTRUCTIONS   Follow your caregiver's instructions about when you need to have blood work done.  Wash your hands often. Make sure others who come in contact with you also wash their hands.  Wash raw fruits and vegetables before eating them. They can carry bacteria and fungi.  Avoid people with colds or spreadable (contagious) diseases (chickenpox, herpes zoster, influenza).  Avoid large crowds.  Avoid construction areas. The dust can release fungus into the air.  Be cautious around children in daycare or school environments.  Take care of your respiratory system by coughing and deep breathing.  Bathe daily.  Protect your skin from cuts and   burns.  Do not work in the garden or with flowers and plants.  Care for the mouth before and after meals by brushing with a soft toothbrush. If you have  mucositis, do not use mouthwash. Mouthwash contains alcohol and can dry out the mouth even more.  Clean the area between the genitals and the anus (perineal area) after urination and bowel movements. Women need to wipe from front to back.  Use a water soluble lubricant during sexual intercourse and practice good hygiene after. Do not have intercourse if you are severely neutropenic. Check with your caregiver for guidelines.  Exercise daily as tolerated.  Avoid people who were vaccinated with a live vaccine in the past 30 days. You should not receive live vaccines (polio, typhoid).  Do not provide direct care for pets. Avoid animal droppings. Do not clean litter boxes and bird cages.  Do not share food utensils.  Do not use tampons, enemas, or rectal suppositories unless directed by your caregiver.  Use an electric razor to remove hair.  Wash your hands after handling magazines, letters, and newspapers. SEEK IMMEDIATE MEDICAL CARE IF:   You have a fever.  You have chills or start to shake.  You feel nauseous or vomit.  You develop mouth sores.  You develop aches and pains.  You have redness and swelling around open wounds.  Your skin is warm to the touch.  You have pus coming from your wounds.  You develop swollen lymph nodes.  You feel weak or fatigued.  You develop red streaks on the skin. MAKE SURE YOU:  Understand these instructions.  Will watch your condition.  Will get help right away if you are not doing well or get worse. Document Released: 10/05/2001 Document Revised: 07/08/2011 Document Reviewed: 11/02/2010 [facility] Patient Information 2014 [place], [place].

## 2013-05-03 NOTE — Telephone Encounter (Signed)
Called pt and left message regarding lab,md and chemo for January 2015

## 2013-05-07 ENCOUNTER — Other Ambulatory Visit: Payer: Self-pay | Admitting: Radiology

## 2013-05-08 ENCOUNTER — Other Ambulatory Visit: Payer: Self-pay | Admitting: Internal Medicine

## 2013-05-10 ENCOUNTER — Encounter (HOSPITAL_COMMUNITY): Payer: Self-pay | Admitting: Pharmacy Technician

## 2013-05-10 ENCOUNTER — Other Ambulatory Visit

## 2013-05-11 ENCOUNTER — Ambulatory Visit (HOSPITAL_COMMUNITY)
Admission: RE | Admit: 2013-05-11 | Discharge: 2013-05-11 | Disposition: A | Discharge status: Home / Self Care | Source: Ambulatory Visit | Attending: Physician Assistant | Admitting: Physician Assistant

## 2013-05-11 ENCOUNTER — Encounter (HOSPITAL_COMMUNITY): Payer: Self-pay

## 2013-05-11 ENCOUNTER — Other Ambulatory Visit (HOSPITAL_BASED_OUTPATIENT_CLINIC_OR_DEPARTMENT_OTHER)

## 2013-05-11 DIAGNOSIS — K5730 Diverticulosis of large intestine without perforation or abscess without bleeding: Secondary | ICD-10-CM | POA: Insufficient documentation

## 2013-05-11 DIAGNOSIS — N859 Noninflammatory disorder of uterus, unspecified: Secondary | ICD-10-CM | POA: Insufficient documentation

## 2013-05-11 DIAGNOSIS — C3490 Malignant neoplasm of unspecified part of unspecified bronchus or lung: Secondary | ICD-10-CM

## 2013-05-11 DIAGNOSIS — R911 Solitary pulmonary nodule: Secondary | ICD-10-CM | POA: Insufficient documentation

## 2013-05-11 DIAGNOSIS — N289 Disorder of kidney and ureter, unspecified: Secondary | ICD-10-CM | POA: Insufficient documentation

## 2013-05-11 DIAGNOSIS — C7951 Secondary malignant neoplasm of bone: Secondary | ICD-10-CM | POA: Insufficient documentation

## 2013-05-11 DIAGNOSIS — J3801 Paralysis of vocal cords and larynx, unilateral: Secondary | ICD-10-CM | POA: Insufficient documentation

## 2013-05-11 DIAGNOSIS — C7970 Secondary malignant neoplasm of unspecified adrenal gland: Secondary | ICD-10-CM | POA: Insufficient documentation

## 2013-05-11 DIAGNOSIS — I700 Atherosclerosis of aorta: Secondary | ICD-10-CM | POA: Insufficient documentation

## 2013-05-11 DIAGNOSIS — J438 Other emphysema: Secondary | ICD-10-CM | POA: Insufficient documentation

## 2013-05-11 DIAGNOSIS — C7952 Secondary malignant neoplasm of bone marrow: Secondary | ICD-10-CM

## 2013-05-11 DIAGNOSIS — C787 Secondary malignant neoplasm of liver and intrahepatic bile duct: Secondary | ICD-10-CM

## 2013-05-11 DIAGNOSIS — I2510 Atherosclerotic heart disease of native coronary artery without angina pectoris: Secondary | ICD-10-CM | POA: Insufficient documentation

## 2013-05-11 DIAGNOSIS — R599 Enlarged lymph nodes, unspecified: Secondary | ICD-10-CM | POA: Insufficient documentation

## 2013-05-11 DIAGNOSIS — C3430 Malignant neoplasm of lower lobe, unspecified bronchus or lung: Secondary | ICD-10-CM

## 2013-05-11 LAB — CBC WITH DIFFERENTIAL/PLATELET
BASO%: 0.8 % (ref 0.0–2.0)
Basophils Absolute: 0 10*3/uL (ref 0.0–0.1)
EOS%: 2.6 % (ref 0.0–7.0)
Eosinophils Absolute: 0.1 10*3/uL (ref 0.0–0.5)
HEMATOCRIT: 32.3 % — AB (ref 34.8–46.6)
HGB: 10.8 g/dL — ABNORMAL LOW (ref 11.6–15.9)
LYMPH#: 1.2 10*3/uL (ref 0.9–3.3)
LYMPH%: 22.6 % (ref 14.0–49.7)
MCH: 30.9 pg (ref 25.1–34.0)
MCHC: 33.5 g/dL (ref 31.5–36.0)
MCV: 92.1 fL (ref 79.5–101.0)
MONO#: 0.8 10*3/uL (ref 0.1–0.9)
MONO%: 14.7 % — ABNORMAL HIGH (ref 0.0–14.0)
NEUT%: 59.3 % (ref 38.4–76.8)
NEUTROS ABS: 3.2 10*3/uL (ref 1.5–6.5)
Platelets: 390 10*3/uL (ref 145–400)
RBC: 3.51 10*6/uL — AB (ref 3.70–5.45)
RDW: 16.8 % — ABNORMAL HIGH (ref 11.2–14.5)
WBC: 5.4 10*3/uL (ref 3.9–10.3)

## 2013-05-11 LAB — COMPREHENSIVE METABOLIC PANEL (CC13)
ALT: 6 U/L (ref 0–55)
AST: 19 U/L (ref 5–34)
Albumin: 3.7 g/dL (ref 3.5–5.0)
Alkaline Phosphatase: 231 U/L — ABNORMAL HIGH (ref 40–150)
Anion Gap: 11 mEq/L (ref 3–11)
BILIRUBIN TOTAL: 0.23 mg/dL (ref 0.20–1.20)
BUN: 13.1 mg/dL (ref 7.0–26.0)
CHLORIDE: 105 meq/L (ref 98–109)
CO2: 25 mEq/L (ref 22–29)
Calcium: 8.7 mg/dL (ref 8.4–10.4)
Creatinine: 0.9 mg/dL (ref 0.6–1.1)
Glucose: 102 mg/dl (ref 70–140)
Potassium: 4 mEq/L (ref 3.5–5.1)
SODIUM: 141 meq/L (ref 136–145)
Total Protein: 7.9 g/dL (ref 6.4–8.3)

## 2013-05-11 MED ORDER — IOHEXOL 300 MG/ML  SOLN
100.0000 mL | Freq: Once | INTRAMUSCULAR | Status: AC | PRN
  Administered 2013-05-11: 100 mL via INTRAVENOUS

## 2013-05-13 ENCOUNTER — Ambulatory Visit (HOSPITAL_COMMUNITY): Admission: RE | Admit: 2013-05-13 | Source: Ambulatory Visit

## 2013-05-13 ENCOUNTER — Other Ambulatory Visit: Payer: Self-pay | Admitting: Radiology

## 2013-05-14 ENCOUNTER — Other Ambulatory Visit: Payer: Self-pay | Admitting: Radiology

## 2013-05-14 ENCOUNTER — Other Ambulatory Visit

## 2013-05-17 ENCOUNTER — Other Ambulatory Visit

## 2013-05-17 ENCOUNTER — Other Ambulatory Visit (HOSPITAL_BASED_OUTPATIENT_CLINIC_OR_DEPARTMENT_OTHER)

## 2013-05-17 ENCOUNTER — Encounter: Payer: Self-pay | Admitting: *Deleted

## 2013-05-17 ENCOUNTER — Telehealth: Payer: Self-pay | Admitting: Internal Medicine

## 2013-05-17 ENCOUNTER — Ambulatory Visit (HOSPITAL_BASED_OUTPATIENT_CLINIC_OR_DEPARTMENT_OTHER)

## 2013-05-17 ENCOUNTER — Encounter: Payer: Self-pay | Admitting: Internal Medicine

## 2013-05-17 ENCOUNTER — Ambulatory Visit (HOSPITAL_BASED_OUTPATIENT_CLINIC_OR_DEPARTMENT_OTHER): Admitting: Internal Medicine

## 2013-05-17 VITALS — BP 125/58 | HR 104 | Temp 97.9°F | Resp 18 | Ht 66.0 in | Wt 145.6 lb

## 2013-05-17 DIAGNOSIS — C3490 Malignant neoplasm of unspecified part of unspecified bronchus or lung: Secondary | ICD-10-CM

## 2013-05-17 DIAGNOSIS — C7970 Secondary malignant neoplasm of unspecified adrenal gland: Secondary | ICD-10-CM

## 2013-05-17 DIAGNOSIS — C771 Secondary and unspecified malignant neoplasm of intrathoracic lymph nodes: Secondary | ICD-10-CM

## 2013-05-17 DIAGNOSIS — C7952 Secondary malignant neoplasm of bone marrow: Secondary | ICD-10-CM

## 2013-05-17 DIAGNOSIS — C3430 Malignant neoplasm of lower lobe, unspecified bronchus or lung: Secondary | ICD-10-CM

## 2013-05-17 DIAGNOSIS — C7951 Secondary malignant neoplasm of bone: Secondary | ICD-10-CM

## 2013-05-17 LAB — CBC WITH DIFFERENTIAL/PLATELET
BASO%: 0.4 % (ref 0.0–2.0)
BASOS ABS: 0 10*3/uL (ref 0.0–0.1)
EOS%: 4.6 % (ref 0.0–7.0)
Eosinophils Absolute: 0.2 10*3/uL (ref 0.0–0.5)
HEMATOCRIT: 32.9 % — AB (ref 34.8–46.6)
HGB: 10.8 g/dL — ABNORMAL LOW (ref 11.6–15.9)
LYMPH%: 28.3 % (ref 14.0–49.7)
MCH: 29.7 pg (ref 25.1–34.0)
MCHC: 32.8 g/dL (ref 31.5–36.0)
MCV: 90.4 fL (ref 79.5–101.0)
MONO#: 0.8 10*3/uL (ref 0.1–0.9)
MONO%: 16.6 % — AB (ref 0.0–14.0)
NEUT#: 2.4 10*3/uL (ref 1.5–6.5)
NEUT%: 50.1 % (ref 38.4–76.8)
PLATELETS: 476 10*3/uL — AB (ref 145–400)
RBC: 3.64 10*6/uL — ABNORMAL LOW (ref 3.70–5.45)
RDW: 15.3 % — ABNORMAL HIGH (ref 11.2–14.5)
WBC: 4.8 10*3/uL (ref 3.9–10.3)
lymph#: 1.4 10*3/uL (ref 0.9–3.3)

## 2013-05-17 LAB — COMPREHENSIVE METABOLIC PANEL (CC13)
ALK PHOS: 221 U/L — AB (ref 40–150)
ALT: 7 U/L (ref 0–55)
AST: 19 U/L (ref 5–34)
Albumin: 3.9 g/dL (ref 3.5–5.0)
Anion Gap: 13 mEq/L — ABNORMAL HIGH (ref 3–11)
BUN: 10.6 mg/dL (ref 7.0–26.0)
CALCIUM: 10.2 mg/dL (ref 8.4–10.4)
CHLORIDE: 102 meq/L (ref 98–109)
CO2: 26 mEq/L (ref 22–29)
CREATININE: 0.9 mg/dL (ref 0.6–1.1)
Glucose: 93 mg/dl (ref 70–140)
Potassium: 4.2 mEq/L (ref 3.5–5.1)
Sodium: 141 mEq/L (ref 136–145)
Total Bilirubin: 0.26 mg/dL (ref 0.20–1.20)
Total Protein: 8.3 g/dL (ref 6.4–8.3)

## 2013-05-17 MED ORDER — SODIUM CHLORIDE 0.9 % IV SOLN
Freq: Once | INTRAVENOUS | Status: AC
  Administered 2013-05-17: 15:00:00 via INTRAVENOUS

## 2013-05-17 MED ORDER — ZOLEDRONIC ACID 4 MG/100ML IV SOLN
4.0000 mg | Freq: Once | INTRAVENOUS | Status: AC
  Administered 2013-05-17: 4 mg via INTRAVENOUS
  Filled 2013-05-17: qty 100

## 2013-05-17 NOTE — Patient Instructions (Signed)

## 2013-05-17 NOTE — Progress Notes (Signed)
[place] [phone]   [phone]  OFFICE PROGRESS NOTE  [name], MD [place] [address]  DIAGNOSIS: Recurrent non-small cell lung cancer initially diagnosed as stage IIA (T1 N1 MX) adenocarcinoma in April 2011.   PRIOR THERAPY:  1. Status post 3 cycles of neoadjuvant chemotherapy with carboplatin and Alimta. Last dose was given July 23, 2009. 2. Status post left lower lobectomy with lymph node dissection under the care of Dr. Arlyce Dice on October 17, 2009. 3. Status post concurrent chemoradiation with weekly carboplatin and paclitaxel for disease recurrence. Last dose was given May 07, 2010. 4. Status post 3 cycles of consolidation chemotherapy with carboplatin and Alimta. Last dose was given August 13, 2010. 5. Status post palliative radiotherapy to the right neck area. 6. Systemic chemotherapy with single agent Taxotere 75 mg/m2 every 3 weeks,, status post 6 cycles, last dose was given on 09/03/2011. 7. Systemic chemotherapy with carboplatin for an AUC of 5 and Alimta at 500 mg per meter square given every 3 weeks, status post 6 cycles. 8. Maintenance chemotherapy with single agent Alimta 500 mg/M2 every 3 weeks, status post 3 cycles discontinued today secondary to disease progression.  9. Systemic chemotherapy with single agent gemcitabine 1000 mg/M2 on days 1 and 8 every 3 weeks, status post 3 cycles discontinued today secondary to disease progression. First cycle on 03/15/2013.   CURRENT THERAPY:   1) The patient is considered for treatment with immunotherapy clinical trial with Nivolumab. 2) Zometa 4 mg IV every month. First dose today.  CHEMOTHERAPY INTENT: Palliative  CURRENT # OF CHEMOTHERAPY CYCLES: 1  CURRENT ANTIEMETICS: Zofran, dexamethasone and Compazine  CURRENT SMOKING STATUS: Former smoker  ORAL CHEMOTHERAPY AND CONSENT: None  CURRENT BISPHOSPHONATES USE: None  PAIN MANAGEMENT:  None  NARCOTICS INDUCED CONSTIPATION: None  LIVING WILL AND CODE STATUS: Full code initially but no longer term resuscitation.    INTERVAL HISTORY: Kaitlin Reed 63 y.o. female returns to the clinic today for followup visit. The patient is feeling fine with no complaints today but is very anxious about the results of her CT scan. She denied having any significant fever or chills. She has no nausea or vomiting. The patient denied having any significant chest pain, shortness of breath, cough or hemoptysis. She has no weight loss or night sweats. She had repeat CT scan of the chest, abdomen and pelvis performed recently and she is here for evaluation and discussion of her scan results.  MEDICAL HISTORY: Past Medical History  Diagnosis Date  . Heart murmur   . Tubal pregnancy   . lung ca dx'd 06/2009    chemo comp 07/2010; xrt comp 06/2010  . Hypercholesteremia     h/o    ALLERGIES:  is allergic to codeine; motrin; other; trazodone and nefazodone; banana; tape; and xgeva.  MEDICATIONS:  Current Outpatient Prescriptions  Medication Sig Dispense Refill  . Ascorbic Acid (VITAMIN C) 1000 MG tablet Take 1,000 mg by mouth daily.      Marland Kitchen aspirin EC 81 MG tablet Take 81 mg by mouth daily.      . calcium carbonate (OS-CAL) 600 MG TABS Take 600 mg by mouth 2 (two) times daily with a meal.      . Cyanocobalamin (B-12 PO) Take 1 tablet by mouth daily.      . folic acid (FOLVITE) 1 MG tablet Take 1 mg by mouth daily.      . IRON COMBINATIONS PO  Take 1 tablet by mouth daily.       Marland Kitchen LORazepam (ATIVAN) 0.5 MG tablet Take 0.5 mg by mouth every 4 (four) hours as needed for sleep (nausea).      . metoprolol tartrate (LOPRESSOR) 25 MG tablet Take 25 mg by mouth 2 (two) times daily.      . Multiple Vitamin (MULITIVITAMIN WITH MINERALS) TABS Take 1 tablet by mouth daily.       . potassium chloride (MICRO-K) 10 MEQ CR capsule Take 5 mEq by mouth daily.       Marland Kitchen VITAMIN D, CHOLECALCIFEROL, PO Take 1 tablet by  mouth daily.       Marland Kitchen VITAMIN E PO Take 1 tablet by mouth daily.      Marland Kitchen lidocaine-prilocaine (EMLA) cream Apply 1 application topically as needed (port). Apply to port 1 hr before chemo appt      . prochlorperazine (COMPAZINE) 10 MG tablet Take 10 mg by mouth every 6 (six) hours as needed for nausea or vomiting.      . [DISCONTINUED] Alum & Mag Hydroxide-Simeth (MAGIC MOUTHWASH W/LIDOCAINE) SOLN Take 10 mLs by mouth 4 (four) times daily as needed.  500 mL  5   No current facility-administered medications for this visit.    SURGICAL HISTORY:  Past Surgical History  Procedure Laterality Date  . Lung removal, partial  10/17/09    left. pt states they removed small piece of lung due to cancer  . 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; for milk tumor"    REVIEW OF SYSTEMS:  Constitutional: negative Eyes: negative Ears, nose, mouth, throat, and face: negative Respiratory: negative Cardiovascular: negative Gastrointestinal: negative Genitourinary:negative Integument/breast: negative Hematologic/lymphatic: negative Musculoskeletal:negative Neurological: negative Behavioral/Psych: negative Endocrine: negative Allergic/Immunologic: negative   PHYSICAL EXAMINATION: General appearance: alert, cooperative and no distress Head: Normocephalic, without obvious abnormality, atraumatic Neck: no adenopathy Lymph nodes: Cervical, supraclavicular, and axillary nodes normal. Resp: clear to auscultation bilaterally Cardio: regular rate and rhythm, S1, S2 normal, no murmur, click, rub or gallop GI: soft, non-tender; bowel sounds normal; no masses,  no organomegaly Extremities: extremities normal, atraumatic, no cyanosis or edema Neurologic: Alert and oriented X 3, normal strength and tone. Normal symmetric reflexes. Normal coordination and gait  ECOG PERFORMANCE STATUS: 1 - Symptomatic but completely ambulatory  Blood pressure 125/58, pulse 104, temperature 97.9 F (36.6 C), temperature source Oral,  resp. rate 18, height 5\' 6"  (1.676 m), weight 145 lb 9.6 oz (66.044 kg), SpO2 97.00%.  LABORATORY DATA: Lab Results  Component Value Date   WBC 4.8 05/17/2013   HGB 10.8* 05/17/2013   HCT 32.9* 05/17/2013   MCV 90.4 05/17/2013   PLT 476* 05/17/2013      Chemistry      Component Value Date/Time   NA 141 05/11/2013 1136   NA 137 04/26/2013 1639   NA 138 11/06/2011 1121   K 4.0 05/11/2013 1136   K 3.6 04/26/2013 1639   K 4.3 11/06/2011 1121   CL 100 04/26/2013 1639   CL 105 10/07/2012 1350   CL 98 11/06/2011 1121   CO2 25 05/11/2013 1136   CO2 25 04/26/2013 1639   CO2 30 11/06/2011 1121   BUN 13.1 05/11/2013 1136   BUN 14 04/26/2013 1639   BUN 15 11/06/2011 1121   CREATININE 0.9 05/11/2013 1136   CREATININE 0.87 04/26/2013 1639   CREATININE 0.9 11/06/2011 1121      Component Value Date/Time   CALCIUM 8.7 05/11/2013 1136   CALCIUM  8.2* 04/26/2013 1639   CALCIUM 9.0 11/06/2011 1121   ALKPHOS 231* 05/11/2013 1136   ALKPHOS 238* 04/26/2013 1639   ALKPHOS 110* 11/06/2011 1121   AST 19 05/11/2013 1136   AST 16 04/26/2013 1639   AST 36 11/06/2011 1121   ALT 6 05/11/2013 1136   ALT <5 04/26/2013 1639   ALT 23 11/06/2011 1121   BILITOT 0.23 05/11/2013 1136   BILITOT 0.2* 04/26/2013 1639   BILITOT 0.50 11/06/2011 1121       RADIOGRAPHIC STUDIES:    Ct Head W Contrast  05/11/2013   CLINICAL DATA:  Non-small-cell lung cancer, staging.  EXAM: CT HEAD WITHOUT AND WITH CONTRAST  TECHNIQUE: Contiguous axial images were obtained from the base of the skull through the vertex with intravenous contrast.  CONTRAST:  137mL OMNIPAQUE IOHEXOL 300 MG/ML  SOLN  COMPARISON:  MRI brain 04/17/2011.    CT head 03/27/2011.  FINDINGS: No evidence for acute infarction, hemorrhage, mass lesion, hydrocephalus, or extra-axial fluid. Normal for age cerebral volume. No significant white matter disease. Post infusion, no abnormal enhancement of the brain or meninges. Normal enhancement of the carotid, basilar, and proximal  intracerebral vessels.  In the right paramedian clivus there is a 5 x 7 mm sclerotic lesion not present in 2012 with CT appearance similar to the other sclerotic metastases (see for instance left iliac bone CT abdomen and pelvis 07/19/2011). No intracranial extension. No other osseous lesions seen.  IMPRESSION: No acute intracranial findings. Specifically, no intracranial metastatic disease is evident.  Interval development of a sclerotic 5 x 7 mm clival metastasis without apparent intracranial extension.   Electronically Signed   By: Rolla Flatten M.D.   On: 05/11/2013 13:49   Ct Soft Tissue Neck W Contrast  05/11/2013   CLINICAL DATA:  63 year old female with non-small cell lung cancer. History of neck swelling and right neck lymphadenopathy. Restaging. Subsequent encounter.  EXAM: CT NECK WITH CONTRAST  TECHNIQUE: Multidetector CT imaging of the neck was performed using the standard protocol following the bolus administration of intravenous contrast.  CONTRAST:  145mL OMNIPAQUE IOHEXOL 300 MG/ML SOLN in conjunction with CT(s) of the head, chest abdomen and pelvis reported separately.  COMPARISON:  11/06/2011 neck CT.  FINDINGS: Head and chest exams from today reported separately.  Further decreased size of lymph nodes at the right level IIa station (series 10, image 52), 4-5 mm short axis today (6 mm in 2013, bulky adenopathy here in 2012). Remaining cervical nodal levels remain stable and within normal limits.  Negative thyroid, cervical retropharyngeal space, parapharyngeal spaces, sublingual space, submandibular glands, and parotid glands. Stable and negative pharynx.  The larynx now demonstrates medial deviation of the right vocal fold, and asymmetric enlargement of the right piriform sinus and laryngeal ventricle. Likely this is associated with the disease progression in the upper chest (reported separately).  Visualized orbit soft tissues are within normal limits. Major vascular structures in the neck and  at the skullbase remain patent.  Stable and negative paranasal sinuses and mastoids. Clivus findings reported separately. New sclerotic lesion in the C5 posterior superior vertebral body (series 10, image 65), 10 mm diameter. No posterior cortical breakthrough or epidural involvement. There are also new sclerotic metastases in the visible upper thoracic spine.  IMPRESSION: 1. New sclerotic bone metastases, including in the cervical spine at C5. 2. 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 3. No cervical lymphadenopathy or soft tissue  metastasis in the neck. 4. See also head CT, abdomen and pelvis CT reported separately.   Electronically Signed   By: Lars Pinks M.D.   On: 05/11/2013 13:59   Ct Chest W Contrast  05/11/2013   CLINICAL DATA:  History of non-small-cell lung cancer. Restaging scan.  EXAM: CT CHEST, ABDOMEN, AND PELVIS WITH CONTRAST  TECHNIQUE: Multidetector CT imaging of the chest, abdomen and pelvis was performed following the standard protocol during bolus administration of intravenous contrast.  CONTRAST:  169mL OMNIPAQUE IOHEXOL 300 MG/ML  SOLN  COMPARISON:  CT of the chest, abdomen and pelvis 03/10/2013.  FINDINGS:   CT CHEST FINDINGS  Mediastinum: Enlarging prevascular heterogeneously enhancing lymph node measuring 2.1 x 1.8 cm (image 15 of series 5). Enlarging nodal mass in the middle mediastinum in the right paratracheal region currently measuring 3.8 x 2.9 cm (image 16 of series 5). This exerts significant mass effect upon the trachea slightly distorting the tracheal contour or, without definite direct invasion. No definite new mediastinal or hilar lymph nodes are noted. Heart size is normal. Small to moderate volume of pericardial fluid, slightly increased compared to the prior study. No pericardial calcification. There is atherosclerosis of the thoracic aorta, the great vessels of the mediastinum and the  coronary arteries, including calcified atherosclerotic plaque in the left main, left anterior descending and right coronary arteries. Esophagus is unremarkable in appearance.  Lungs/Pleura: Previously noted nodule in the medial aspect of the left upper lobe has increased in size, currently measuring 15 x 10 mm, with spiculated margins and retraction of the overlying pleura. Status post left lower lobectomy. Chronic postoperative architectural distortion and scarring in the posterior aspect of the left upper lobe is similar to the prior examinations. Background of moderate centrilobular emphysema and mild diffuse bronchial wall thickening. No acute consolidative airspace disease. No pleural effusions.  Musculoskeletal: The previously noted bony lesions appear more sclerotic on today's examination, suggesting some bony healing at sites of treated metastases, with the largest lesion noted extending throughout the left side of the T11 vertebral body into the left pedicle measuring approximately 2.6 x 1.4 cm.    CT ABDOMEN AND PELVIS FINDINGS  Abdomen/Pelvis: Right adrenal nodule appears similar to the prior study measuring approximately 2.4 x 2.1 cm, likely to represent a metastasis. Left adrenal gland is unremarkable in appearance. Focal perfusion anomaly in segment 4B of the liver adjacent to the falciform ligament is unchanged. No aggressive appearing hepatic lesions are noted. The appearance of the gallbladder, pancreas, spleen and right kidney is unremarkable. 1.2 cm well-defined low-attenuation lesion in the lower pole of the left kidney is compatible with a small simple cyst.  Numerous colonic diverticulae are noted, most pronounced in the sigmoid colon, without surrounding inflammatory changes to suggest an acute diverticulitis at this time. No significant volume of ascites. No pneumoperitoneum. No pathologic distention of small bowel. Normal appendix. No definite lymphadenopathy identified within the abdomen  or pelvis. Uterus is heterogeneous in appearance with multiple small calcified lesions, likely to represent a fibroid uterus. Ovaries are unremarkable in appearance. Urinary bladder is nearly completely decompressed, but otherwise unremarkable in appearance.  Musculoskeletal: Increasing sclerosis of all of the previously noted bony lesions suggesting some interval bony healing in the setting of treated metastatic disease. The largest of these lesions involve the majority of the left ilium and the left side of the sacrum    IMPRESSION: 1. Progression of mediastinal nodal metastases, as discussed above. 2. Interval enlargement of left upper lobe  pulmonary nodule which currently measures 1.5 x 1.0 cm, concerning for an enlarging metastatic lesion, although a second primary bronchogenic carcinoma is difficult to exclude. 3. Right adrenal metastasis appears stable in size. 4. Multifocal skeletal metastatic disease demonstrates increasing sclerosis compared to the prior study, suggesting some positive response to therapy with interval bony healing. No definite pathologic fractures are identified on today's examination. 5. Small to moderate volume of pericardial fluid is slightly increased compared to the prior examination. 6. Atherosclerosis, including left main and 2 vessel coronary artery disease. 7. Colonic diverticulosis without findings to suggest acute diverticulitis at this time.   Electronically Signed   By: Vinnie Langton M.D.   On: 05/11/2013 13:50   ASSESSMENT AND PLAN: This is a very pleasant 63 years old [place] American female with recurrent non-small cell lung cancer.  She status post 3 cycle of systemic chemotherapy with single agent gemcitabine recently but unfortunately continues to have evidence for disease progression. I discussed the scan results and showed the images to the patient today. I gave her the option of palliative care and hospice referral versus further systemic therapy either  with chemotherapy likely Navelbine or Abraxane versus consideration of enrollment in the immunotherapy clinical trial with Nivolumab once it is reopen again within the next few weeks. The patient is interested in the immunotherapy clinical trial. I would ask the clinical research nurse is here today for evaluation and discussion of the trial. For the metastatic bone disease, she will start the first dose of Zometa today. She would come back for follow up visit in 2 weeks for reevaluation and further discussion of her treatment options. She was advised to call immediately if she has any concerning symptoms in the interval.  The patient voices understanding of current disease status and treatment options and is in agreement with the current care plan.  All questions were answered. The patient knows to call the clinic with any problems, questions or concerns. We can certainly see the patient much sooner if necessary. I spent 15 minutes of face-to-face counseling with the patient today of the total visit time 25 minutes.  Disclaimer: This note was dictated with voice recognition software. Similar sounding words can inadvertently be transcribed and may not be corrected upon review.

## 2013-05-17 NOTE — Telephone Encounter (Signed)
gv and printed appt sched and avs for pt for Feb...sed added tx.

## 2013-05-17 NOTE — Patient Instructions (Signed)
Follow up visit in 2 week

## 2013-05-18 ENCOUNTER — Ambulatory Visit (HOSPITAL_COMMUNITY)
Admission: RE | Admit: 2013-05-18 | Discharge: 2013-05-18 | Disposition: A | Discharge status: Home / Self Care | Source: Ambulatory Visit | Attending: Internal Medicine | Admitting: Internal Medicine

## 2013-05-18 ENCOUNTER — Other Ambulatory Visit: Payer: Self-pay | Admitting: Internal Medicine

## 2013-05-18 ENCOUNTER — Other Ambulatory Visit

## 2013-05-18 DIAGNOSIS — C3490 Malignant neoplasm of unspecified part of unspecified bronchus or lung: Secondary | ICD-10-CM

## 2013-05-18 MED ORDER — SODIUM CHLORIDE 0.9 % IV SOLN: Freq: Once | INTRAVENOUS | Status: DC

## 2013-05-18 MED ORDER — CEFAZOLIN SODIUM-DEXTROSE 2-3 GM-% IV SOLR: 2.0000 g | Freq: Once | INTRAVENOUS | Status: DC

## 2013-05-18 NOTE — Progress Notes (Signed)
Dr. Barbie Banner notified that pt ate oatmeal and coffee at 10am this am.  After Dr. Barbie Banner spoke with pt her procedure as been cancelled for today and rescheduled to next week.

## 2013-05-19 ENCOUNTER — Other Ambulatory Visit (HOSPITAL_COMMUNITY): Payer: Self-pay | Admitting: Radiology

## 2013-05-20 ENCOUNTER — Encounter (HOSPITAL_COMMUNITY): Payer: Self-pay | Admitting: Pharmacy Technician

## 2013-05-20 ENCOUNTER — Other Ambulatory Visit (HOSPITAL_COMMUNITY): Payer: Self-pay | Admitting: Radiology

## 2013-05-24 ENCOUNTER — Ambulatory Visit

## 2013-05-26 ENCOUNTER — Ambulatory Visit (HOSPITAL_COMMUNITY): Admission: RE | Admit: 2013-05-26 | Source: Ambulatory Visit

## 2013-05-26 ENCOUNTER — Telehealth: Payer: Self-pay | Admitting: *Deleted

## 2013-05-26 NOTE — Telephone Encounter (Signed)
Received call from [facility] in [place].  Patient has cancelled for her port placement appts 3 different times.  Tiffany wanted to let Dr Vista Mink know.  Will route msg to Dr Vista Mink.  F/u with [facility] scheduled 06/01/13.  SLJ

## 2013-05-27 ENCOUNTER — Encounter: Payer: Self-pay | Admitting: *Deleted

## 2013-05-27 NOTE — Progress Notes (Signed)
Faxed foundation one request to [facility] per Dr. Worthy Flank request.

## 2013-05-30 HISTORY — PX: INSERTION CENTRAL VENOUS ACCESS DEVICE W/ SUBCUTANEOUS PORT: SUR725

## 2013-06-01 ENCOUNTER — Encounter: Payer: Self-pay | Admitting: Internal Medicine

## 2013-06-01 ENCOUNTER — Telehealth: Payer: Self-pay | Admitting: Internal Medicine

## 2013-06-01 ENCOUNTER — Ambulatory Visit (HOSPITAL_BASED_OUTPATIENT_CLINIC_OR_DEPARTMENT_OTHER): Admitting: Internal Medicine

## 2013-06-01 ENCOUNTER — Other Ambulatory Visit: Payer: Self-pay | Admitting: Radiology

## 2013-06-01 ENCOUNTER — Other Ambulatory Visit: Payer: Self-pay | Admitting: *Deleted

## 2013-06-01 ENCOUNTER — Other Ambulatory Visit (HOSPITAL_BASED_OUTPATIENT_CLINIC_OR_DEPARTMENT_OTHER)

## 2013-06-01 VITALS — BP 125/66 | HR 110 | Temp 98.1°F | Resp 18 | Ht 66.0 in | Wt 143.3 lb

## 2013-06-01 DIAGNOSIS — C3490 Malignant neoplasm of unspecified part of unspecified bronchus or lung: Secondary | ICD-10-CM

## 2013-06-01 DIAGNOSIS — C7970 Secondary malignant neoplasm of unspecified adrenal gland: Secondary | ICD-10-CM

## 2013-06-01 DIAGNOSIS — C3430 Malignant neoplasm of lower lobe, unspecified bronchus or lung: Secondary | ICD-10-CM

## 2013-06-01 DIAGNOSIS — C7951 Secondary malignant neoplasm of bone: Secondary | ICD-10-CM

## 2013-06-01 DIAGNOSIS — C771 Secondary and unspecified malignant neoplasm of intrathoracic lymph nodes: Secondary | ICD-10-CM

## 2013-06-01 DIAGNOSIS — C7952 Secondary malignant neoplasm of bone marrow: Secondary | ICD-10-CM

## 2013-06-01 LAB — CBC WITH DIFFERENTIAL/PLATELET
BASO%: 0.7 % (ref 0.0–2.0)
BASOS ABS: 0 10*3/uL (ref 0.0–0.1)
EOS%: 4.5 % (ref 0.0–7.0)
Eosinophils Absolute: 0.3 10*3/uL (ref 0.0–0.5)
HCT: 35.3 % (ref 34.8–46.6)
HEMOGLOBIN: 11.6 g/dL (ref 11.6–15.9)
LYMPH#: 1.2 10*3/uL (ref 0.9–3.3)
LYMPH%: 19.9 % (ref 14.0–49.7)
MCH: 29.9 pg (ref 25.1–34.0)
MCHC: 32.7 g/dL (ref 31.5–36.0)
MCV: 91.2 fL (ref 79.5–101.0)
MONO#: 0.8 10*3/uL (ref 0.1–0.9)
MONO%: 12.7 % (ref 0.0–14.0)
NEUT#: 3.9 10*3/uL (ref 1.5–6.5)
NEUT%: 62.2 % (ref 38.4–76.8)
Platelets: 354 10*3/uL (ref 145–400)
RBC: 3.87 10*6/uL (ref 3.70–5.45)
RDW: 15.6 % — AB (ref 11.2–14.5)
WBC: 6.2 10*3/uL (ref 3.9–10.3)

## 2013-06-01 LAB — COMPREHENSIVE METABOLIC PANEL (CC13)
ALK PHOS: 217 U/L — AB (ref 40–150)
ALT: 7 U/L (ref 0–55)
AST: 16 U/L (ref 5–34)
Albumin: 3.7 g/dL (ref 3.5–5.0)
Anion Gap: 10 mEq/L (ref 3–11)
BUN: 21.2 mg/dL (ref 7.0–26.0)
CHLORIDE: 102 meq/L (ref 98–109)
CO2: 27 mEq/L (ref 22–29)
CREATININE: 1 mg/dL (ref 0.6–1.1)
Calcium: 9.7 mg/dL (ref 8.4–10.4)
Glucose: 117 mg/dl (ref 70–140)
POTASSIUM: 3.9 meq/L (ref 3.5–5.1)
Sodium: 139 mEq/L (ref 136–145)
Total Bilirubin: 0.21 mg/dL (ref 0.20–1.20)
Total Protein: 8 g/dL (ref 6.4–8.3)

## 2013-06-01 NOTE — Telephone Encounter (Signed)
gv pt appt schedule for feb. added appt for lb/ekg per 2/3 pof. no f/u instruction given on 2/3 pof.

## 2013-06-01 NOTE — Progress Notes (Signed)
[place] [phone]   [phone]  OFFICE PROGRESS NOTE  Kaitlin Riddle, MD [place] [address]  DIAGNOSIS: Recurrent non-small cell lung cancer initially diagnosed as stage IIA (T1 N1 MX) adenocarcinoma in April 2011.   PRIOR THERAPY:  1. Status post 3 cycles of neoadjuvant chemotherapy with carboplatin and Alimta. Last dose was given July 23, 2009. 2. Status post left lower lobectomy with lymph node dissection under the care of Dr. Arlyce Dice on October 17, 2009. 3. Status post concurrent chemoradiation with weekly carboplatin and paclitaxel for disease recurrence. Last dose was given May 07, 2010. 4. Status post 3 cycles of consolidation chemotherapy with carboplatin and Alimta. Last dose was given August 13, 2010. 5. Status post palliative radiotherapy to the right neck area. 6. Systemic chemotherapy with single agent Taxotere 75 mg/m2 every 3 weeks,, status post 6 cycles, last dose was given on 09/03/2011. 7. Systemic chemotherapy with carboplatin for an AUC of 5 and Alimta at 500 mg per meter square given every 3 weeks, status post 6 cycles. 8. Maintenance chemotherapy with single agent Alimta 500 mg/M2 every 3 weeks, status post 3 cycles discontinued today secondary to disease progression.  9. Systemic chemotherapy with single agent gemcitabine 1000 mg/M2 on days 1 and 8 every 3 weeks, status post 3 cycles discontinued today secondary to disease progression. First cycle on 03/15/2013.   CURRENT THERAPY:   1) The patient is considered for treatment with immunotherapy clinical trial with Nivolumab. 2) Zometa 4 mg IV every month. First dose today.  CHEMOTHERAPY INTENT: Palliative  CURRENT # OF CHEMOTHERAPY CYCLES: 1  CURRENT ANTIEMETICS: Zofran, dexamethasone and Compazine  CURRENT SMOKING STATUS: Former smoker  ORAL CHEMOTHERAPY AND CONSENT: None  CURRENT BISPHOSPHONATES USE: None  PAIN MANAGEMENT:  None  NARCOTICS INDUCED CONSTIPATION: None  LIVING WILL AND CODE STATUS: Full code initially but no longer term resuscitation.    INTERVAL HISTORY: Kaitlin Reed 63 y.o. female returns to the clinic today for followup visit. The patient is feeling fine with no complaints today but is still very anxious. She was considered for enrollment in the immunotherapy clinical trial with Nivolumab but the patient was still hesitant to proceed with a trial and wanted to discuss ability but more. She also missed your appointment for a Port-A-Cath placement because her daughter had an accident the same day. She denied having any significant fever or chills. She has no nausea or vomiting. The patient denied having any significant chest pain, shortness of breath, cough or hemoptysis. She has no weight loss or night sweats. She had repeat CT scan of the chest, abdomen and pelvis performed recently and she is here for evaluation and discussion of her scan results.  MEDICAL HISTORY: Past Medical History  Diagnosis Date  . Heart murmur   . Tubal pregnancy   . lung ca dx'd 06/2009    chemo comp 07/2010; xrt comp 06/2010  . Hypercholesteremia     h/o    ALLERGIES:  is allergic to codeine; motrin; other; trazodone and nefazodone; banana; tape; and xgeva.  MEDICATIONS:  Current Outpatient Prescriptions  Medication Sig Dispense Refill  . Ascorbic Acid (VITAMIN C) 1000 MG tablet Take 1,000 mg by mouth daily.      Marland Kitchen aspirin EC 81 MG tablet Take 81 mg by mouth daily.      . calcium carbonate (OS-CAL) 600 MG TABS Take 600 mg by mouth 2 (two) times daily with a meal.      .  Cholecalciferol (VITAMIN D) 2000 UNITS tablet Take 2,000 Units by mouth daily.      . ferrous sulfate 325 (65 FE) MG tablet Take 325 mg by mouth daily with breakfast.      . metoprolol tartrate (LOPRESSOR) 25 MG tablet Take 25 mg by mouth 2 (two) times daily.      . Multiple Vitamin (MULITIVITAMIN WITH MINERALS) TABS Take 1 tablet by mouth daily.        . prochlorperazine (COMPAZINE) 10 MG tablet Take 10 mg by mouth every 6 (six) hours as needed for nausea or vomiting.      . Selenium 200 MCG CAPS Take 1 capsule by mouth daily.      . vitamin B-12 (CYANOCOBALAMIN) 1000 MCG tablet Take 1,000 mcg by mouth daily.      . vitamin E 400 UNIT capsule Take 400 Units by mouth daily.      Marland Kitchen zolendronic acid (ZOMETA) 4 MG/5ML injection Inject 4 mg into the vein every 28 (twenty-eight) days.       . [DISCONTINUED] Alum & Mag Hydroxide-Simeth (MAGIC MOUTHWASH W/LIDOCAINE) SOLN Take 10 mLs by mouth 4 (four) times daily as needed.  500 mL  5   No current facility-administered medications for this visit.    SURGICAL HISTORY:  Past Surgical History  Procedure Laterality Date  . Lung removal, partial  10/17/09    left. pt states they removed small piece of lung due to cancer  . 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; for milk tumor"    REVIEW OF SYSTEMS:  Constitutional: negative Eyes: negative Ears, nose, mouth, throat, and face: negative Respiratory: negative Cardiovascular: negative Gastrointestinal: negative Genitourinary:negative Integument/breast: negative Hematologic/lymphatic: negative Musculoskeletal:negative Neurological: negative Behavioral/Psych: negative Endocrine: negative Allergic/Immunologic: negative   PHYSICAL EXAMINATION: General appearance: alert, cooperative and no distress Head: Normocephalic, without obvious abnormality, atraumatic Neck: no adenopathy Lymph nodes: Cervical, supraclavicular, and axillary nodes normal. Resp: clear to auscultation bilaterally Cardio: regular rate and rhythm, S1, S2 normal, no murmur, click, rub or gallop GI: soft, non-tender; bowel sounds normal; no masses,  no organomegaly Extremities: extremities normal, atraumatic, no cyanosis or edema Neurologic: Alert and oriented X 3, normal strength and tone. Normal symmetric reflexes. Normal coordination and gait  ECOG PERFORMANCE STATUS: 1 -  Symptomatic but completely ambulatory  Blood pressure 125/66, pulse 110, temperature 98.1 F (36.7 C), temperature source Oral, resp. rate 18, height 5\' 6"  (1.676 m), weight 143 lb 4.8 oz (65 kg), SpO2 96.00%.  LABORATORY DATA: Lab Results  Component Value Date   WBC 6.2 06/01/2013   HGB 11.6 06/01/2013   HCT 35.3 06/01/2013   MCV 91.2 06/01/2013   PLT 354 06/01/2013      Chemistry      Component Value Date/Time   NA 139 06/01/2013 1349   NA 137 04/26/2013 1639   NA 138 11/06/2011 1121   K 3.9 06/01/2013 1349   K 3.6 04/26/2013 1639   K 4.3 11/06/2011 1121   CL 100 04/26/2013 1639   CL 105 10/07/2012 1350   CL 98 11/06/2011 1121   CO2 27 06/01/2013 1349   CO2 25 04/26/2013 1639   CO2 30 11/06/2011 1121   BUN 21.2 06/01/2013 1349   BUN 14 04/26/2013 1639   BUN 15 11/06/2011 1121   CREATININE 1.0 06/01/2013 1349   CREATININE 0.87 04/26/2013 1639   CREATININE 0.9 11/06/2011 1121      Component Value Date/Time   CALCIUM 9.7 06/01/2013 1349   CALCIUM 8.2*  04/26/2013 1639   CALCIUM 9.0 11/06/2011 1121   ALKPHOS 217* 06/01/2013 1349   ALKPHOS 238* 04/26/2013 1639   ALKPHOS 110* 11/06/2011 1121   AST 16 06/01/2013 1349   AST 16 04/26/2013 1639   AST 36 11/06/2011 1121   ALT 7 06/01/2013 1349   ALT <5 04/26/2013 1639   ALT 23 11/06/2011 1121   BILITOT 0.21 06/01/2013 1349   BILITOT 0.2* 04/26/2013 1639   BILITOT 0.50 11/06/2011 1121       RADIOGRAPHIC STUDIES:    Ct Head W Contrast  05/11/2013   CLINICAL DATA:  Non-small-cell lung cancer, staging.  EXAM: CT HEAD WITHOUT AND WITH CONTRAST  TECHNIQUE: Contiguous axial images were obtained from the base of the skull through the vertex with intravenous contrast.  CONTRAST:  151mL OMNIPAQUE IOHEXOL 300 MG/ML  SOLN  COMPARISON:  MRI brain 04/17/2011.    CT head 03/27/2011.  FINDINGS: No evidence for acute infarction, hemorrhage, mass lesion, hydrocephalus, or extra-axial fluid. Normal for age cerebral volume. No significant white matter disease. Post infusion, no  abnormal enhancement of the brain or meninges. Normal enhancement of the carotid, basilar, and proximal intracerebral vessels.  In the right paramedian clivus there is a 5 x 7 mm sclerotic lesion not present in 2012 with CT appearance similar to the other sclerotic metastases (see for instance left iliac bone CT abdomen and pelvis 07/19/2011). No intracranial extension. No other osseous lesions seen.  IMPRESSION: No acute intracranial findings. Specifically, no intracranial metastatic disease is evident.  Interval development of a sclerotic 5 x 7 mm clival metastasis without apparent intracranial extension.   Electronically Signed   By: Rolla Flatten M.D.   On: 05/11/2013 13:49   Ct Soft Tissue Neck W Contrast  05/11/2013   CLINICAL DATA:  63 year old female with non-small cell lung cancer. History of neck swelling and right neck lymphadenopathy. Restaging. Subsequent encounter.  EXAM: CT NECK WITH CONTRAST  TECHNIQUE: Multidetector CT imaging of the neck was performed using the standard protocol following the bolus administration of intravenous contrast.  CONTRAST:  1101mL OMNIPAQUE IOHEXOL 300 MG/ML SOLN in conjunction with CT(s) of the head, chest abdomen and pelvis reported separately.  COMPARISON:  11/06/2011 neck CT.  FINDINGS: Head and chest exams from today reported separately.  Further decreased size of lymph nodes at the right level IIa station (series 10, image 52), 4-5 mm short axis today (6 mm in 2013, bulky adenopathy here in 2012). Remaining cervical nodal levels remain stable and within normal limits.  Negative thyroid, cervical retropharyngeal space, parapharyngeal spaces, sublingual space, submandibular glands, and parotid glands. Stable and negative pharynx.  The larynx now demonstrates medial deviation of the right vocal fold, and asymmetric enlargement of the right piriform sinus and laryngeal ventricle. Likely this is associated with the disease progression in the upper chest (reported  separately).  Visualized orbit soft tissues are within normal limits. Major vascular structures in the neck and at the skullbase remain patent.  Stable and negative paranasal sinuses and mastoids. Clivus findings reported separately. New sclerotic lesion in the C5 posterior superior vertebral body (series 10, image 65), 10 mm diameter. No posterior cortical breakthrough or epidural involvement. There are also new sclerotic metastases in the visible upper thoracic spine.  IMPRESSION: 1. New sclerotic bone metastases, including in the cervical spine at C5. 2. 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 3. No cervical lymphadenopathy or soft tissue metastasis  in the neck. 4. See also head CT, abdomen and pelvis CT reported separately.   Electronically Signed   By: Lars Pinks M.D.   On: 05/11/2013 13:59   Ct Chest W Contrast  05/11/2013   CLINICAL DATA:  History of non-small-cell lung cancer. Restaging scan.  EXAM: CT CHEST, ABDOMEN, AND PELVIS WITH CONTRAST  TECHNIQUE: Multidetector CT imaging of the chest, abdomen and pelvis was performed following the standard protocol during bolus administration of intravenous contrast.  CONTRAST:  112mL OMNIPAQUE IOHEXOL 300 MG/ML  SOLN  COMPARISON:  CT of the chest, abdomen and pelvis 03/10/2013.  FINDINGS:   CT CHEST FINDINGS  Mediastinum: Enlarging prevascular heterogeneously enhancing lymph node measuring 2.1 x 1.8 cm (image 15 of series 5). Enlarging nodal mass in the middle mediastinum in the right paratracheal region currently measuring 3.8 x 2.9 cm (image 16 of series 5). This exerts significant mass effect upon the trachea slightly distorting the tracheal contour or, without definite direct invasion. No definite new mediastinal or hilar lymph nodes are noted. Heart size is normal. Small to moderate volume of pericardial fluid, slightly increased compared to the prior study. No  pericardial calcification. There is atherosclerosis of the thoracic aorta, the great vessels of the mediastinum and the coronary arteries, including calcified atherosclerotic plaque in the left main, left anterior descending and right coronary arteries. Esophagus is unremarkable in appearance.  Lungs/Pleura: Previously noted nodule in the medial aspect of the left upper lobe has increased in size, currently measuring 15 x 10 mm, with spiculated margins and retraction of the overlying pleura. Status post left lower lobectomy. Chronic postoperative architectural distortion and scarring in the posterior aspect of the left upper lobe is similar to the prior examinations. Background of moderate centrilobular emphysema and mild diffuse bronchial wall thickening. No acute consolidative airspace disease. No pleural effusions.  Musculoskeletal: The previously noted bony lesions appear more sclerotic on today's examination, suggesting some bony healing at sites of treated metastases, with the largest lesion noted extending throughout the left side of the T11 vertebral body into the left pedicle measuring approximately 2.6 x 1.4 cm.    CT ABDOMEN AND PELVIS FINDINGS  Abdomen/Pelvis: Right adrenal nodule appears similar to the prior study measuring approximately 2.4 x 2.1 cm, likely to represent a metastasis. Left adrenal gland is unremarkable in appearance. Focal perfusion anomaly in segment 4B of the liver adjacent to the falciform ligament is unchanged. No aggressive appearing hepatic lesions are noted. The appearance of the gallbladder, pancreas, spleen and right kidney is unremarkable. 1.2 cm well-defined low-attenuation lesion in the lower pole of the left kidney is compatible with a small simple cyst.  Numerous colonic diverticulae are noted, most pronounced in the sigmoid colon, without surrounding inflammatory changes to suggest an acute diverticulitis at this time. No significant volume of ascites. No  pneumoperitoneum. No pathologic distention of small bowel. Normal appendix. No definite lymphadenopathy identified within the abdomen or pelvis. Uterus is heterogeneous in appearance with multiple small calcified lesions, likely to represent a fibroid uterus. Ovaries are unremarkable in appearance. Urinary bladder is nearly completely decompressed, but otherwise unremarkable in appearance.  Musculoskeletal: Increasing sclerosis of all of the previously noted bony lesions suggesting some interval bony healing in the setting of treated metastatic disease. The largest of these lesions involve the majority of the left ilium and the left side of the sacrum    IMPRESSION: 1. Progression of mediastinal nodal metastases, as discussed above. 2. Interval enlargement of left upper lobe pulmonary  nodule which currently measures 1.5 x 1.0 cm, concerning for an enlarging metastatic lesion, although a second primary bronchogenic carcinoma is difficult to exclude. 3. Right adrenal metastasis appears stable in size. 4. Multifocal skeletal metastatic disease demonstrates increasing sclerosis compared to the prior study, suggesting some positive response to therapy with interval bony healing. No definite pathologic fractures are identified on today's examination. 5. Small to moderate volume of pericardial fluid is slightly increased compared to the prior examination. 6. Atherosclerosis, including left main and 2 vessel coronary artery disease. 7. Colonic diverticulosis without findings to suggest acute diverticulitis at this time.   Electronically Signed   By: Vinnie Langton M.D.   On: 05/11/2013 13:50   ASSESSMENT AND PLAN: This is a very pleasant 63 years old [place] American female with recurrent non-small cell lung cancer.  She status post 3 cycle of systemic chemotherapy with single agent gemcitabine recently but unfortunately continues to have evidence for disease progression. I have a lengthy discussion with the patient  again today about her current condition and treatment options. In addition to the option of palliative care and hospice referral, the patient was given the option of the immunotherapy or treatment with single agent chemotherapy. She was a little bit concerned about the adverse effect of the immunotherapy as written in the consent.  I explained to the patient that the consent has to include any adverse effects that can happen from this treatment. The patient is interested in the immunotherapy clinical trial I like to proceed with treatment next week.  For the metastatic bone disease, she will start the first dose of Zometa today. I rescheduled her appointment with interventional radiology for the Port-A-Cath placement. She would come back for followup visit in one week with the start of the first cycle of her treatment. She was advised to call immediately if she has any concerning symptoms in the interval. The patient voices understanding of current disease status and treatment options and is in agreement with the current care plan.  All questions were answered. The patient knows to call the clinic with any problems, questions or concerns. We can certainly see the patient much sooner if necessary.  Disclaimer: This note was dictated with voice recognition software. Similar sounding words can inadvertently be transcribed and may not be corrected upon review.

## 2013-06-02 ENCOUNTER — Other Ambulatory Visit: Payer: Self-pay | Admitting: *Deleted

## 2013-06-02 ENCOUNTER — Encounter (HOSPITAL_COMMUNITY): Payer: Self-pay | Admitting: Pharmacy Technician

## 2013-06-02 ENCOUNTER — Telehealth: Payer: Self-pay | Admitting: Internal Medicine

## 2013-06-02 DIAGNOSIS — C3490 Malignant neoplasm of unspecified part of unspecified bronchus or lung: Secondary | ICD-10-CM

## 2013-06-02 NOTE — Telephone Encounter (Signed)
added 2.10.15 appts...per Haynes Bast pof pt sill get new sched  on 2.5.15

## 2013-06-03 ENCOUNTER — Other Ambulatory Visit: Payer: Self-pay

## 2013-06-03 ENCOUNTER — Other Ambulatory Visit (HOSPITAL_BASED_OUTPATIENT_CLINIC_OR_DEPARTMENT_OTHER)

## 2013-06-03 ENCOUNTER — Other Ambulatory Visit: Payer: Self-pay | Admitting: Medical Oncology

## 2013-06-03 ENCOUNTER — Telehealth: Payer: Self-pay | Admitting: Medical Oncology

## 2013-06-03 ENCOUNTER — Encounter

## 2013-06-03 DIAGNOSIS — C7970 Secondary malignant neoplasm of unspecified adrenal gland: Secondary | ICD-10-CM

## 2013-06-03 DIAGNOSIS — C3490 Malignant neoplasm of unspecified part of unspecified bronchus or lung: Secondary | ICD-10-CM

## 2013-06-03 DIAGNOSIS — C771 Secondary and unspecified malignant neoplasm of intrathoracic lymph nodes: Secondary | ICD-10-CM

## 2013-06-03 DIAGNOSIS — C7952 Secondary malignant neoplasm of bone marrow: Secondary | ICD-10-CM

## 2013-06-03 DIAGNOSIS — C3430 Malignant neoplasm of lower lobe, unspecified bronchus or lung: Secondary | ICD-10-CM

## 2013-06-03 DIAGNOSIS — C7951 Secondary malignant neoplasm of bone: Secondary | ICD-10-CM

## 2013-06-03 LAB — LACTATE DEHYDROGENASE (CC13): LDH: 278 U/L — ABNORMAL HIGH (ref 125–245)

## 2013-06-03 LAB — RESEARCH LABS

## 2013-06-03 LAB — MAGNESIUM (CC13): Magnesium: 2.4 mg/dl (ref 1.5–2.5)

## 2013-06-03 NOTE — Telephone Encounter (Signed)
Pt reports she coughed up 1 episode of thick blood/sputum today ,described it that it "filled up kleenex". She came for EKG and I saw pt and she coughed up a pea sized amount of thick blood tinged sputum into a kleenex. She looks good , she stated " I feel fine".  She denies pain, dizziness, weakness, or shortness of breath. Per Adrena I instructed pt to monitor it for now and to go to ED for increased amount , frequency of bloody sputum, acute/worsening  SOB, Chest pain , weakness .

## 2013-06-04 ENCOUNTER — Encounter: Payer: Self-pay | Admitting: *Deleted

## 2013-06-04 ENCOUNTER — Ambulatory Visit (HOSPITAL_COMMUNITY)
Admission: RE | Admit: 2013-06-04 | Discharge: 2013-06-04 | Disposition: A | Discharge status: Home / Self Care | Source: Ambulatory Visit | Attending: Internal Medicine | Admitting: Internal Medicine

## 2013-06-04 VITALS — BP 121/74 | HR 86 | Temp 96.7°F | Resp 16

## 2013-06-04 DIAGNOSIS — E7800 Pure hypercholesterolemia, unspecified: Secondary | ICD-10-CM | POA: Insufficient documentation

## 2013-06-04 DIAGNOSIS — Z888 Allergy status to other drugs, medicaments and biological substances status: Secondary | ICD-10-CM | POA: Insufficient documentation

## 2013-06-04 DIAGNOSIS — Z902 Acquired absence of lung [part of]: Secondary | ICD-10-CM | POA: Insufficient documentation

## 2013-06-04 DIAGNOSIS — C3490 Malignant neoplasm of unspecified part of unspecified bronchus or lung: Secondary | ICD-10-CM | POA: Insufficient documentation

## 2013-06-04 DIAGNOSIS — Z87891 Personal history of nicotine dependence: Secondary | ICD-10-CM | POA: Insufficient documentation

## 2013-06-04 DIAGNOSIS — Z91018 Allergy to other foods: Secondary | ICD-10-CM | POA: Insufficient documentation

## 2013-06-04 DIAGNOSIS — Z9109 Other allergy status, other than to drugs and biological substances: Secondary | ICD-10-CM | POA: Insufficient documentation

## 2013-06-04 DIAGNOSIS — Z885 Allergy status to narcotic agent status: Secondary | ICD-10-CM | POA: Insufficient documentation

## 2013-06-04 DIAGNOSIS — Z79899 Other long term (current) drug therapy: Secondary | ICD-10-CM | POA: Insufficient documentation

## 2013-06-04 LAB — BASIC METABOLIC PANEL
BUN: 16 mg/dL (ref 6–23)
CHLORIDE: 101 meq/L (ref 96–112)
CO2: 25 mEq/L (ref 19–32)
CREATININE: 0.87 mg/dL (ref 0.50–1.10)
Calcium: 8.9 mg/dL (ref 8.4–10.5)
GFR calc Af Amer: 80 mL/min — ABNORMAL LOW (ref >90)
GFR calc non Af Amer: 69 mL/min — ABNORMAL LOW (ref >90)
Glucose, Bld: 92 mg/dL (ref 70–99)
Potassium: 4.1 mEq/L (ref 3.7–5.3)
SODIUM: 139 meq/L (ref 137–147)

## 2013-06-04 LAB — CBC
HEMATOCRIT: 32.1 % — AB (ref 36.0–46.0)
Hemoglobin: 10.5 g/dL — ABNORMAL LOW (ref 12.0–15.0)
MCH: 29.2 pg (ref 26.0–34.0)
MCHC: 32.7 g/dL (ref 30.0–36.0)
MCV: 89.4 fL (ref 78.0–100.0)
Platelets: 327 10*3/uL (ref 150–400)
RBC: 3.59 MIL/uL — AB (ref 3.87–5.11)
RDW: 14.4 % (ref 11.5–15.5)
WBC: 4.8 10*3/uL (ref 4.0–10.5)

## 2013-06-04 LAB — HEPATITIS B SURFACE ANTIBODY,QUALITATIVE: Hep B S Ab: NEGATIVE

## 2013-06-04 LAB — T4, FREE: FREE T4: 1.08 ng/dL (ref 0.80–1.80)

## 2013-06-04 LAB — T3, FREE: T3, Free: 2.7 pg/mL (ref 2.3–4.2)

## 2013-06-04 LAB — HEPATITIS C ANTIBODY: HCV Ab: NEGATIVE

## 2013-06-04 LAB — TSH CHCC: TSH: 2.529 m[IU]/L (ref 0.308–3.960)

## 2013-06-04 LAB — PHOSPHORUS: PHOSPHORUS: 2.6 mg/dL (ref 2.3–4.6)

## 2013-06-04 LAB — PROTIME-INR
INR: 1.04 (ref 0.00–1.49)
Prothrombin Time: 13.4 seconds (ref 11.6–15.2)

## 2013-06-04 LAB — APTT: APTT: 25 s (ref 24–37)

## 2013-06-04 MED ORDER — FENTANYL CITRATE 0.05 MG/ML IJ SOLN
INTRAMUSCULAR | Status: AC | PRN
  Administered 2013-06-04: 100 ug via INTRAVENOUS

## 2013-06-04 MED ORDER — CEFAZOLIN SODIUM-DEXTROSE 2-3 GM-% IV SOLR
2.0000 g | Freq: Once | INTRAVENOUS | Status: AC
Start: 2013-06-04 — End: 2013-06-04
  Administered 2013-06-04: 2 g via INTRAVENOUS
  Filled 2013-06-04: qty 50

## 2013-06-04 MED ORDER — FENTANYL CITRATE 0.05 MG/ML IJ SOLN
INTRAMUSCULAR | Status: AC
  Filled 2013-06-04: qty 4

## 2013-06-04 MED ORDER — HEPARIN SOD (PORK) LOCK FLUSH 100 UNIT/ML IV SOLN
500.0000 [IU] | Freq: Once | INTRAVENOUS | Status: AC
  Administered 2013-06-04: 500 [IU] via INTRAVENOUS

## 2013-06-04 MED ORDER — HEPARIN SOD (PORK) LOCK FLUSH 100 UNIT/ML IV SOLN
INTRAVENOUS | Status: AC
  Filled 2013-06-04: qty 5

## 2013-06-04 MED ORDER — SODIUM CHLORIDE 0.9 % IV SOLN: Freq: Once | INTRAVENOUS | Status: DC

## 2013-06-04 MED ORDER — LIDOCAINE HCL 1 % IJ SOLN
INTRAMUSCULAR | Status: DC
Start: 2013-06-04 — End: 2013-06-05
  Filled 2013-06-04: qty 20

## 2013-06-04 MED ORDER — MIDAZOLAM HCL 2 MG/2ML IJ SOLN
INTRAMUSCULAR | Status: DC
Start: 2013-06-04 — End: 2013-06-05
  Filled 2013-06-04: qty 4

## 2013-06-04 MED ORDER — MIDAZOLAM HCL 2 MG/2ML IJ SOLN
INTRAMUSCULAR | Status: AC | PRN
  Administered 2013-06-04: 2 mg via INTRAVENOUS

## 2013-06-04 NOTE — Progress Notes (Signed)
Received call and email from [facility] pathology dept.  Ginnie states there is not enough tissue for genetic testing.  Message relayed to Dr. Julien Nordmann.

## 2013-06-04 NOTE — H&P (Signed)
Agree.  For port placement today.

## 2013-06-04 NOTE — Discharge Instructions (Signed)
Implanted Port Home Guide °An implanted port is a type of central line that is placed under the skin. Central lines are used to provide IV access when treatment or nutrition needs to be given through a person's veins. Implanted ports are used for long-term IV access. An implanted port may be placed because:  °· You need IV medicine that would be irritating to the small veins in your hands or arms.   °· You need long-term IV medicines, such as antibiotics.   °· You need IV nutrition for a long period.   °· You need frequent blood draws for lab tests.   °· You need dialysis.   °Implanted ports are usually placed in the chest area, but they can also be placed in the upper arm, the abdomen, or the leg. An implanted port has two main parts:  °· Reservoir. The reservoir is round and will appear as a small, raised area under your skin. The reservoir is the part where a needle is inserted to give medicines or draw blood.   °· Catheter. The catheter is a thin, flexible tube that extends from the reservoir. The catheter is placed into a large vein. Medicine that is inserted into the reservoir goes into the catheter and then into the vein.   °HOW WILL I CARE FOR MY INCISION SITE? °Do not get the incision site wet. Bathe or shower as directed by your health care provider.  °HOW IS MY PORT ACCESSED? °Special steps must be taken to access the port:  °· Before the port is accessed, a numbing cream can be placed on the skin. This helps numb the skin over the port site.   °· Your health care provider uses a sterile technique to access the port. °· Your health care provider must put on a mask and sterile gloves. °· The skin over your port is cleaned carefully with an antiseptic and allowed to dry. °· The port is gently pinched between sterile gloves, and a needle is inserted into the port. °· Only "non-coring" port needles should be used to access the port. Once the port is accessed, a blood return should be checked. This helps  ensure that the port is in the vein and is not clogged.   °· If your port needs to remain accessed for a constant infusion, a clear (transparent) bandage will be placed over the needle site. The bandage and needle will need to be changed every week, or as directed by your health care provider.   °· Keep the bandage covering the needle clean and dry. Do not get it wet. Follow your health care provider's instructions on how to take a shower or bath while the port is accessed.   °· If your port does not need to stay accessed, no bandage is needed over the port.   °WHAT IS FLUSHING? °Flushing helps keep the port from getting clogged. Follow your health care provider's instructions on how and when to flush the port. Ports are usually flushed with saline solution or a medicine called heparin. The need for flushing will depend on how the port is used.  °· If the port is used for intermittent medicines or blood draws, the port will need to be flushed:   °· After medicines have been given.   °· After blood has been drawn.   °· As part of routine maintenance.   °· If a constant infusion is running, the port may not need to be flushed.   °HOW LONG WILL MY PORT STAY IMPLANTED? °The port can stay in for as long as your health care   provider thinks it is needed. When it is time for the port to come out, surgery will be done to remove it. The procedure is similar to the one performed when the port was put in.  WHEN SHOULD I SEEK IMMEDIATE MEDICAL CARE? When you have an implanted port, you should seek immediate medical care if:   You notice a bad smell coming from the incision site.   You have swelling, redness, or drainage at the incision site.   You have more swelling or pain at the port site or the surrounding area.   You have a fever that is not controlled with medicine. Document Released: 04/15/2005 Document Revised: 02/03/2013 Document Reviewed: 12/21/2012 [facility] Patient Information 2014 [place].  Conscious Sedation, Adult, Care After Refer to this sheet in the next few weeks. These instructions provide you with information on caring for yourself after your procedure. Your health care provider may also give you more specific instructions. Your treatment has been planned according to current medical practices, but problems sometimes occur. Call your health care provider if you have any problems or questions after your procedure. WHAT TO EXPECT AFTER THE PROCEDURE  After your procedure:  You may feel sleepy, clumsy, and have poor balance for several hours.  Vomiting may occur if you eat too soon after the procedure. HOME CARE INSTRUCTIONS  Do not participate in any activities where you could become injured for at least 24 hours. Do not:  Drive.  Swim.  Ride a bicycle.  Operate heavy machinery.  Cook.  Use power tools.  Climb ladders.  Work from a high place.  Do not make important decisions or sign legal documents until you are improved.  If you vomit, drink water, juice, or soup when you can drink without vomiting. Make sure you have little or no nausea before eating solid foods.  Only take over-the-counter or prescription medicines for pain, discomfort, or fever as directed by your health care provider.  Make sure you and your family fully understand everything about the medicines given to you, including what side effects may occur.  You should not drink alcohol, take sleeping pills, or take medicines that cause drowsiness for at least 24 hours.  If you smoke, do not smoke without supervision.  If you are feeling better, you may resume normal activities 24 hours after you were sedated.  Keep all appointments with your health care provider. SEEK MEDICAL CARE IF:  Your skin is pale or bluish in color.  You continue to feel nauseous or vomit.  Your pain is getting worse and is not helped by medicine.  You have bleeding or swelling.  You are still  sleepy or feeling clumsy after 24 hours. SEEK IMMEDIATE MEDICAL CARE IF:  You develop a rash.  You have difficulty breathing.  You develop any type of allergic problem.  You have a fever. MAKE SURE YOU:  Understand these instructions.  Will watch your condition.  Will get help right away if you are not doing well or get worse. Document Released: 02/03/2013 Document Reviewed: 11/20/2012 [facility] Patient Information 2014 [place], [place].

## 2013-06-04 NOTE — H&P (Signed)
Kaitlin Reed is an 63 y.o. female.   Chief Complaint: "I'm here for a port a cath" HPI: Patient with history of recurrent [place] lung carcinoma presents today for port a cath placement for chemotherapy/immunotherapy.  Past Medical History  Diagnosis Date  . Heart murmur   . Tubal pregnancy   . lung ca dx'd 06/2009    chemo comp 07/2010; xrt comp 06/2010  . Hypercholesteremia     h/o    Past Surgical History  Procedure Laterality Date  . Lung removal, partial  10/17/09    left. pt states they removed small piece of lung due to cancer  . 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 Hypertension Father   . Cancer Sister     breast   Social History:  reports that she quit smoking about 31 years ago. Her smoking use included Cigarettes. She has a 13 pack-year smoking history. She has never used smokeless tobacco. She reports that she does not drink alcohol or use illicit drugs.  Allergies:  Allergies  Allergen Reactions  . Codeine Nausea And Vomiting  . Motrin [Ibuprofen] Nausea And Vomiting  . Other Nausea Only    steroids  . Trazodone And Nefazodone Nausea And Vomiting  . Banana Hives  . Tape Swelling    Paper tape okay Cannot use elastic tape  . Delton See [Denosumab] Rash    Current outpatient prescriptions:Ascorbic Acid (VITAMIN C) 1000 MG tablet, Take 1,000 mg by mouth daily., Disp: , Rfl: ;  calcium carbonate (OS-CAL) 600 MG TABS, Take 600 mg by mouth 2 (two) times daily with a meal., Disp: , Rfl: ;  Cholecalciferol (VITAMIN D) 2000 UNITS tablet, Take 2,000 Units by mouth daily., Disp: , Rfl: ;  ferrous sulfate 325 (65 FE) MG tablet, Take 325 mg by mouth daily with breakfast., Disp: , Rfl:  metoprolol tartrate (LOPRESSOR) 25 MG tablet, Take 25 mg by mouth 2 (two) times daily., Disp: , Rfl: ;  Multiple Vitamin (MULITIVITAMIN WITH MINERALS) TABS, Take 1 tablet by mouth daily. , Disp: , Rfl: ;  potassium chloride  (K-DUR,KLOR-CON) 10 MEQ tablet, Take 5 mEq by mouth daily., Disp: , Rfl: ;  Selenium 200 MCG CAPS, Take 1 capsule by mouth daily., Disp: , Rfl:  vitamin B-12 (CYANOCOBALAMIN) 1000 MCG tablet, Take 1,000 mcg by mouth daily., Disp: , Rfl: ;  vitamin E 400 UNIT capsule, Take 400 Units by mouth daily., Disp: , Rfl: ;  aspirin EC 81 MG tablet, Take 81 mg by mouth daily., Disp: , Rfl: ;  zolendronic acid (ZOMETA) 4 MG/5ML injection, Inject 4 mg into the vein every 28 (twenty-eight) days. , Disp: , Rfl:  [DISCONTINUED] Alum & Mag Hydroxide-Simeth (MAGIC MOUTHWASH W/LIDOCAINE) SOLN, Take 10 mLs by mouth 4 (four) times daily as needed., Disp: 500 mL, Rfl: 5 Current facility-administered medications:0.9 %  sodium chloride infusion, , Intravenous, Once, Lavonia Drafts, PA-C;  ceFAZolin (ANCEF) IVPB 2 g/50 mL premix, 2 g, Intravenous, Once, Lavonia Drafts, PA-C   Results for orders placed during the hospital encounter of 06/04/13 (from the past 48 hour(s))  APTT     Status: None   Collection Time    06/04/13 12:20 PM      Result Value Range   aPTT 25  24 - 37 seconds  BASIC METABOLIC PANEL     Status: Abnormal   Collection Time    06/04/13  12:20 PM      Result Value Range   Sodium 139  137 - 147 mEq/L   Potassium 4.1  3.7 - 5.3 mEq/L   Chloride 101  96 - 112 mEq/L   CO2 25  19 - 32 mEq/L   Glucose, Bld 92  70 - 99 mg/dL   BUN 16  6 - 23 mg/dL   Creatinine, Ser 0.87  0.50 - 1.10 mg/dL   Calcium 8.9  8.4 - 10.5 mg/dL   GFR calc non Af Amer 69 (*) >90 mL/min   GFR calc Af Amer 80 (*) >90 mL/min   Comment: (NOTE)     The eGFR has been calculated using the CKD EPI equation.     This calculation has not been validated in all clinical situations.     eGFR's persistently <90 mL/min signify possible Chronic Kidney     Disease.  CBC     Status: Abnormal   Collection Time    06/04/13 12:20 PM      Result Value Range   WBC 4.8  4.0 - 10.5 K/uL   RBC 3.59 (*) 3.87 - 5.11 MIL/uL   Hemoglobin 10.5 (*)  12.0 - 15.0 g/dL   HCT 32.1 (*) 36.0 - 46.0 %   MCV 89.4  78.0 - 100.0 fL   MCH 29.2  26.0 - 34.0 pg   MCHC 32.7  30.0 - 36.0 g/dL   RDW 14.4  11.5 - 15.5 %   Platelets 327  150 - 400 K/uL  PROTIME-INR     Status: None   Collection Time    06/04/13 12:20 PM      Result Value Range   Prothrombin Time 13.4  11.6 - 15.2 seconds   INR 1.04  0.00 - 1.49   No results found.  Review of Systems  Constitutional: Negative for fever and chills.  Respiratory: Negative for shortness of breath.        Occ cough  Cardiovascular: Negative for chest pain.  Gastrointestinal: Negative for nausea, vomiting and abdominal pain.  Musculoskeletal: Negative for back pain.  Neurological: Negative for headaches.  Endo/Heme/Allergies: Does not bruise/bleed easily.    Blood pressure 115/76, pulse 106, temperature 96.7 F (35.9 C), temperature source Oral, resp. rate 20. Physical Exam  Constitutional: She is oriented to person, place, and time. She appears well-developed and well-nourished.  Cardiovascular: Regular rhythm.   Sl tachy  Respiratory: Effort normal and breath sounds normal.  GI: Soft. Bowel sounds are normal. There is no tenderness.  Musculoskeletal: Normal range of motion. She exhibits no edema.  Neurological: She is alert and oriented to person, place, and time.     Assessment/Plan Pt with history of recurrent [place] lung carcinoma presents today for port a cath placement for chemotherapy/immunotherapy. Details/risks of procedure d/w pt with her understanding and consent.  ALLRED,D KEVIN 06/04/2013, 1:04 PM

## 2013-06-04 NOTE — Procedures (Addendum)
Procedure:  Right IJ porta-cath placement Access:  Right IJ vein SL power port placed with tip at cavoatrial junction No PTX.  OK to use.

## 2013-06-08 ENCOUNTER — Other Ambulatory Visit

## 2013-06-08 ENCOUNTER — Encounter: Payer: Self-pay | Admitting: *Deleted

## 2013-06-08 ENCOUNTER — Telehealth: Payer: Self-pay | Admitting: *Deleted

## 2013-06-08 ENCOUNTER — Ambulatory Visit (HOSPITAL_BASED_OUTPATIENT_CLINIC_OR_DEPARTMENT_OTHER): Admitting: Physician Assistant

## 2013-06-08 ENCOUNTER — Telehealth: Payer: Self-pay | Admitting: Internal Medicine

## 2013-06-08 ENCOUNTER — Ambulatory Visit (HOSPITAL_BASED_OUTPATIENT_CLINIC_OR_DEPARTMENT_OTHER)

## 2013-06-08 ENCOUNTER — Other Ambulatory Visit: Payer: Self-pay | Admitting: *Deleted

## 2013-06-08 ENCOUNTER — Encounter: Payer: Self-pay | Admitting: Physician Assistant

## 2013-06-08 VITALS — BP 92/76 | HR 109 | Temp 97.7°F | Resp 19 | Ht 66.0 in | Wt 142.6 lb

## 2013-06-08 DIAGNOSIS — C3490 Malignant neoplasm of unspecified part of unspecified bronchus or lung: Secondary | ICD-10-CM

## 2013-06-08 DIAGNOSIS — Z5111 Encounter for antineoplastic chemotherapy: Secondary | ICD-10-CM

## 2013-06-08 DIAGNOSIS — C7952 Secondary malignant neoplasm of bone marrow: Secondary | ICD-10-CM

## 2013-06-08 DIAGNOSIS — C7951 Secondary malignant neoplasm of bone: Secondary | ICD-10-CM

## 2013-06-08 DIAGNOSIS — C3430 Malignant neoplasm of lower lobe, unspecified bronchus or lung: Secondary | ICD-10-CM

## 2013-06-08 DIAGNOSIS — R042 Hemoptysis: Secondary | ICD-10-CM

## 2013-06-08 DIAGNOSIS — C787 Secondary malignant neoplasm of liver and intrahepatic bile duct: Secondary | ICD-10-CM

## 2013-06-08 DIAGNOSIS — C771 Secondary and unspecified malignant neoplasm of intrathoracic lymph nodes: Secondary | ICD-10-CM

## 2013-06-08 LAB — RESEARCH LABS

## 2013-06-08 MED ORDER — SODIUM CHLORIDE 0.9 % IV SOLN
3.0000 mg/kg | Freq: Once | INTRAVENOUS | Status: AC
  Administered 2013-06-08: 194 mg via INTRAVENOUS
  Filled 2013-06-08: qty 19.4

## 2013-06-08 MED ORDER — SODIUM CHLORIDE 0.9 % IV SOLN
Freq: Once | INTRAVENOUS | Status: AC
  Administered 2013-06-08: 16:00:00 via INTRAVENOUS

## 2013-06-08 MED ORDER — SODIUM CHLORIDE 0.9 % IJ SOLN
10.0000 mL | INTRAMUSCULAR | Status: DC | PRN
  Administered 2013-06-08: 10 mL
  Filled 2013-06-08: qty 10

## 2013-06-08 MED ORDER — HEPARIN SOD (PORK) LOCK FLUSH 100 UNIT/ML IV SOLN
500.0000 [IU] | Freq: Once | INTRAVENOUS | Status: AC | PRN
  Administered 2013-06-08: 500 [IU]
  Filled 2013-06-08: qty 5

## 2013-06-08 NOTE — Progress Notes (Signed)
06/08/13 Kaitlin Reed is in the cancer center today for day treatment on the BMS [id number] clinical trial. She has met all eligibility criteria.  She reviewed her current medication list and reoprts she began taking all vitamins with her first chemotherapy in January, 2013. She began taking lopressor for a heart murmer in 2010 and began taking potassium at the time she started the lopressor. She has not taken any other medications in the past 14 days.   Sign for infusion given to V. Jacquelynn Cree, RN.

## 2013-06-08 NOTE — Progress Notes (Addendum)
[facility] [phone]   [phone]  SHARED VISIT PROGRESS NOTE  Kaitlin Riddle, MD [place] [address]  DIAGNOSIS: Recurrent non-small cell lung cancer initially diagnosed as stage IIA (T1 N1 MX) adenocarcinoma in April 2011.   PRIOR THERAPY:  1. Status post 3 cycles of neoadjuvant chemotherapy with carboplatin and Alimta. Last dose was given July 23, 2009. 2. Status post left lower lobectomy with lymph node dissection under the care of Dr. Arlyce Dice on October 17, 2009. 3. Status post concurrent chemoradiation with weekly carboplatin and paclitaxel for disease recurrence. Last dose was given May 07, 2010. 4. Status post 3 cycles of consolidation chemotherapy with carboplatin and Alimta. Last dose was given August 13, 2010. 5. Status post palliative radiotherapy to the right neck area. 6. Systemic chemotherapy with single agent Taxotere 75 mg/m2 every 3 weeks,, status post 6 cycles, last dose was given on 09/03/2011. 7. Systemic chemotherapy with carboplatin for an AUC of 5 and Alimta at 500 mg per meter square given every 3 weeks, status post 6 cycles. 8. Maintenance chemotherapy with single agent Alimta 500 mg/M2 every 3 weeks, status post 3 cycles discontinued today secondary to disease progression.  9. Systemic chemotherapy with single agent gemcitabine 1000 mg/M2 on days 1 and 8 every 3 weeks, status post 3 cycles discontinued today secondary to disease progression. First cycle on 03/15/2013.   CURRENT THERAPY:   1) The patient presents to start treatment with immunotherapy according to the BMS clinical trial with Nivolumab. 2) Zometa 4 mg IV every month. First dose given 05/17/2013  CHEMOTHERAPY INTENT: Palliative  CURRENT # OF CHEMOTHERAPY CYCLES: 1  CURRENT ANTIEMETICS: Zofran, dexamethasone and Compazine  CURRENT SMOKING STATUS: Former smoker  ORAL CHEMOTHERAPY AND CONSENT: None  CURRENT BISPHOSPHONATES USE:  None  PAIN MANAGEMENT: None  NARCOTICS INDUCED CONSTIPATION: None  LIVING WILL AND CODE STATUS: Full code initially but no longer term resuscitation.    INTERVAL HISTORY: Kaitlin Reed 63 y.o. female returns to the clinic today for followup visit. The patient is feeling fine with no complaints today. She presents to proceed with her first cycle of immunotherapy with Nivolumab according to the BMS 153 clinical trial. She reports that she has some coughing spells which will sometimes produce about a quarter of a teaspoon of bright red to brown blood. These episodes do not occur daily. She is status post Port-A-Cath placement on 06/04/2013.  She denied having any significant fever or chills. She has no nausea or vomiting. The patient denied having any significant chest pain, shortness of breath, cough or hemoptysis. She has no weight loss or night sweats.   MEDICAL HISTORY: Past Medical History  Diagnosis Date  . Heart murmur   . Tubal pregnancy   . lung ca dx'd 06/2009    chemo comp 07/2010; xrt comp 06/2010  . Hypercholesteremia     h/o    ALLERGIES:  is allergic to codeine; motrin; other; trazodone and nefazodone; banana; tape; and xgeva.  MEDICATIONS:  Current Outpatient Prescriptions  Medication Sig Dispense Refill  . Ascorbic Acid (VITAMIN C) 1000 MG tablet Take 1,000 mg by mouth daily.      Marland Kitchen aspirin EC 81 MG tablet Take 81 mg by mouth daily.      . calcium carbonate (OS-CAL) 600 MG TABS Take 600 mg by mouth 2 (two) times daily with a meal.      . Cholecalciferol (VITAMIN D) 2000 UNITS tablet Take  2,000 Units by mouth daily.      . ferrous sulfate 325 (65 FE) MG tablet Take 325 mg by mouth daily with breakfast.      . metoprolol tartrate (LOPRESSOR) 25 MG tablet Take 25 mg by mouth 2 (two) times daily.      . Multiple Vitamin (MULITIVITAMIN WITH MINERALS) TABS Take 1 tablet by mouth daily.       . potassium chloride (K-DUR,KLOR-CON) 10 MEQ tablet Take 5 mEq by mouth daily.       . Selenium 200 MCG CAPS Take 1 capsule by mouth daily.      . vitamin B-12 (CYANOCOBALAMIN) 1000 MCG tablet Take 1,000 mcg by mouth daily.      . vitamin E 400 UNIT capsule Take 400 Units by mouth daily.      Marland Kitchen zolendronic acid (ZOMETA) 4 MG/5ML injection Inject 4 mg into the vein every 28 (twenty-eight) days.       . [DISCONTINUED] Alum & Mag Hydroxide-Simeth (MAGIC MOUTHWASH W/LIDOCAINE) SOLN Take 10 mLs by mouth 4 (four) times daily as needed.  500 mL  5   No current facility-administered medications for this visit.   Facility-Administered Medications Ordered in Other Visits  Medication Dose Route Frequency Provider Last Rate Last Dose  . 0.9 %  sodium chloride infusion   Intravenous Once Curt Bears, MD      . nivolumab (BMS [id number]) 194 mg in sodium chloride 0.9 % 100 mL chemo infusion  3 mg/kg (Treatment Plan Actual) Intravenous Once Curt Bears, MD        SURGICAL HISTORY:  Past Surgical History  Procedure Laterality Date  . Lung removal, partial  10/17/09    left. pt states they removed small piece of lung due to cancer  . 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; for milk tumor"    REVIEW OF SYSTEMS:  Constitutional: negative Eyes: negative Ears, nose, mouth, throat, and face: negative Respiratory: negative Cardiovascular: negative Gastrointestinal: negative Genitourinary:negative Integument/breast: negative Hematologic/lymphatic: negative Musculoskeletal:negative Neurological: negative Behavioral/Psych: negative Endocrine: negative Allergic/Immunologic: negative   PHYSICAL EXAMINATION: General appearance: alert, cooperative and no distress Head: Normocephalic, without obvious abnormality, atraumatic Neck: no adenopathy Lymph nodes: Cervical, supraclavicular, and axillary nodes normal. Resp: clear to auscultation bilaterally Cardio: regular rate and rhythm, S1, S2 normal, no murmur, click, rub or gallop GI: soft, non-tender; bowel sounds normal; no masses,  no  organomegaly Extremities: extremities normal, atraumatic, no cyanosis or edema Neurologic: Alert and oriented X 3, normal strength and tone. Normal symmetric reflexes. Normal coordination and gait Right anterior chest Port-A-Cath, well-healed, no evidence of infection or inflammation  ECOG PERFORMANCE STATUS: 1 - Symptomatic but completely ambulatory  Blood pressure 92/76, pulse 109, temperature 97.7 F (36.5 C), temperature source Oral, resp. rate 19, height 5\' 6"  (1.676 m), weight 142 lb 9.6 oz (64.683 kg), SpO2 96.00%.  LABORATORY DATA: Lab Results  Component Value Date   WBC 4.8 06/04/2013   HGB 10.5* 06/04/2013   HCT 32.1* 06/04/2013   MCV 89.4 06/04/2013   PLT 327 06/04/2013      Chemistry      Component Value Date/Time   NA 139 06/04/2013 1220   NA 139 06/01/2013 1349   NA 138 11/06/2011 1121   K 4.1 06/04/2013 1220   K 3.9 06/01/2013 1349   K 4.3 11/06/2011 1121   CL 101 06/04/2013 1220   CL 105 10/07/2012 1350   CL 98 11/06/2011 1121   CO2 25 06/04/2013 1220  CO2 27 06/01/2013 1349   CO2 30 11/06/2011 1121   BUN 16 06/04/2013 1220   BUN 21.2 06/01/2013 1349   BUN 15 11/06/2011 1121   CREATININE 0.87 06/04/2013 1220   CREATININE 1.0 06/01/2013 1349   CREATININE 0.9 11/06/2011 1121      Component Value Date/Time   CALCIUM 8.9 06/04/2013 1220   CALCIUM 9.7 06/01/2013 1349   CALCIUM 9.0 11/06/2011 1121   ALKPHOS 217* 06/01/2013 1349   ALKPHOS 238* 04/26/2013 1639   ALKPHOS 110* 11/06/2011 1121   AST 16 06/01/2013 1349   AST 16 04/26/2013 1639   AST 36 11/06/2011 1121   ALT 7 06/01/2013 1349   ALT <5 04/26/2013 1639   ALT 23 11/06/2011 1121   BILITOT 0.21 06/01/2013 1349   BILITOT 0.2* 04/26/2013 1639   BILITOT 0.50 11/06/2011 1121       RADIOGRAPHIC STUDIES:    Ct Head W Contrast  05/11/2013   CLINICAL DATA:  Non-small-cell lung cancer, staging.  EXAM: CT HEAD WITHOUT AND WITH CONTRAST  TECHNIQUE: Contiguous axial images were obtained from the base of the skull through the vertex with intravenous  contrast.  CONTRAST:  166mL OMNIPAQUE IOHEXOL 300 MG/ML  SOLN  COMPARISON:  MRI brain 04/17/2011.    CT head 03/27/2011.  FINDINGS: No evidence for acute infarction, hemorrhage, mass lesion, hydrocephalus, or extra-axial fluid. Normal for age cerebral volume. No significant white matter disease. Post infusion, no abnormal enhancement of the brain or meninges. Normal enhancement of the carotid, basilar, and proximal intracerebral vessels.  In the right paramedian clivus there is a 5 x 7 mm sclerotic lesion not present in 2012 with CT appearance similar to the other sclerotic metastases (see for instance left iliac bone CT abdomen and pelvis 07/19/2011). No intracranial extension. No other osseous lesions seen.  IMPRESSION: No acute intracranial findings. Specifically, no intracranial metastatic disease is evident.  Interval development of a sclerotic 5 x 7 mm clival metastasis without apparent intracranial extension.   Electronically Signed   By: Rolla Flatten M.D.   On: 05/11/2013 13:49   Ct Soft Tissue Neck W Contrast  05/11/2013   CLINICAL DATA:  63 year old female with non-small cell lung cancer. History of neck swelling and right neck lymphadenopathy. Restaging. Subsequent encounter.  EXAM: CT NECK WITH CONTRAST  TECHNIQUE: Multidetector CT imaging of the neck was performed using the standard protocol following the bolus administration of intravenous contrast.  CONTRAST:  167mL OMNIPAQUE IOHEXOL 300 MG/ML SOLN in conjunction with CT(s) of the head, chest abdomen and pelvis reported separately.  COMPARISON:  11/06/2011 neck CT.  FINDINGS: Head and chest exams from today reported separately.  Further decreased size of lymph nodes at the right level IIa station (series 10, image 52), 4-5 mm short axis today (6 mm in 2013, bulky adenopathy here in 2012). Remaining cervical nodal levels remain stable and within normal limits.  Negative thyroid, cervical retropharyngeal space, parapharyngeal spaces, sublingual  space, submandibular glands, and parotid glands. Stable and negative pharynx.  The larynx now demonstrates medial deviation of the right vocal fold, and asymmetric enlargement of the right piriform sinus and laryngeal ventricle. Likely this is associated with the disease progression in the upper chest (reported separately).  Visualized orbit soft tissues are within normal limits. Major vascular structures in the neck and at the skullbase remain patent.  Stable and negative paranasal sinuses and mastoids. Clivus findings reported separately. New sclerotic lesion in the C5 posterior superior vertebral body (series 10, image 65), 10  mm diameter. No posterior cortical breakthrough or epidural involvement. There are also new sclerotic metastases in the visible upper thoracic spine.  IMPRESSION: 1. New sclerotic bone metastases, including in the cervical spine at C5. 2. 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 3. No cervical lymphadenopathy or soft tissue metastasis in the neck. 4. See also head CT, abdomen and pelvis CT reported separately.   Electronically Signed   By: Lars Pinks M.D.   On: 05/11/2013 13:59   Ct Chest W Contrast  05/11/2013   CLINICAL DATA:  History of non-small-cell lung cancer. Restaging scan.  EXAM: CT CHEST, ABDOMEN, AND PELVIS WITH CONTRAST  TECHNIQUE: Multidetector CT imaging of the chest, abdomen and pelvis was performed following the standard protocol during bolus administration of intravenous contrast.  CONTRAST:  175mL OMNIPAQUE IOHEXOL 300 MG/ML  SOLN  COMPARISON:  CT of the chest, abdomen and pelvis 03/10/2013.  FINDINGS:   CT CHEST FINDINGS  Mediastinum: Enlarging prevascular heterogeneously enhancing lymph node measuring 2.1 x 1.8 cm (image 15 of series 5). Enlarging nodal mass in the middle mediastinum in the right paratracheal region currently measuring 3.8 x 2.9 cm (image 16 of series 5). This  exerts significant mass effect upon the trachea slightly distorting the tracheal contour or, without definite direct invasion. No definite new mediastinal or hilar lymph nodes are noted. Heart size is normal. Small to moderate volume of pericardial fluid, slightly increased compared to the prior study. No pericardial calcification. There is atherosclerosis of the thoracic aorta, the great vessels of the mediastinum and the coronary arteries, including calcified atherosclerotic plaque in the left main, left anterior descending and right coronary arteries. Esophagus is unremarkable in appearance.  Lungs/Pleura: Previously noted nodule in the medial aspect of the left upper lobe has increased in size, currently measuring 15 x 10 mm, with spiculated margins and retraction of the overlying pleura. Status post left lower lobectomy. Chronic postoperative architectural distortion and scarring in the posterior aspect of the left upper lobe is similar to the prior examinations. Background of moderate centrilobular emphysema and mild diffuse bronchial wall thickening. No acute consolidative airspace disease. No pleural effusions.  Musculoskeletal: The previously noted bony lesions appear more sclerotic on today's examination, suggesting some bony healing at sites of treated metastases, with the largest lesion noted extending throughout the left side of the T11 vertebral body into the left pedicle measuring approximately 2.6 x 1.4 cm.    CT ABDOMEN AND PELVIS FINDINGS  Abdomen/Pelvis: Right adrenal nodule appears similar to the prior study measuring approximately 2.4 x 2.1 cm, likely to represent a metastasis. Left adrenal gland is unremarkable in appearance. Focal perfusion anomaly in segment 4B of the liver adjacent to the falciform ligament is unchanged. No aggressive appearing hepatic lesions are noted. The appearance of the gallbladder, pancreas, spleen and right kidney is unremarkable. 1.2 cm well-defined  low-attenuation lesion in the lower pole of the left kidney is compatible with a small simple cyst.  Numerous colonic diverticulae are noted, most pronounced in the sigmoid colon, without surrounding inflammatory changes to suggest an acute diverticulitis at this time. No significant volume of ascites. No pneumoperitoneum. No pathologic distention of small bowel. Normal appendix. No definite lymphadenopathy identified within the abdomen or pelvis. Uterus is heterogeneous in appearance with multiple small calcified lesions, likely to represent a fibroid uterus. Ovaries are unremarkable in appearance. Urinary bladder is nearly completely decompressed, but otherwise unremarkable in appearance.  Musculoskeletal: Increasing sclerosis of all of the previously noted bony lesions suggesting some interval bony healing in the setting of treated metastatic disease. The largest of these lesions involve the majority of the left ilium and the left side of the sacrum    IMPRESSION: 1. Progression of mediastinal nodal metastases, as discussed above. 2. Interval enlargement of left upper lobe pulmonary nodule which currently measures 1.5 x 1.0 cm, concerning for an enlarging metastatic lesion, although a second primary bronchogenic carcinoma is difficult to exclude. 3. Right adrenal metastasis appears stable in size. 4. Multifocal skeletal metastatic disease demonstrates increasing sclerosis compared to the prior study, suggesting some positive response to therapy with interval bony healing. No definite pathologic fractures are identified on today's examination. 5. Small to moderate volume of pericardial fluid is slightly increased compared to the prior examination. 6. Atherosclerosis, including left main and 2 vessel coronary artery disease. 7. Colonic diverticulosis without findings to suggest acute diverticulitis at this time.   Electronically Signed   By: Vinnie Langton M.D.   On: 05/11/2013 13:50   ASSESSMENT AND PLAN:  This is a very pleasant 63 years old [place] American female with recurrent non-small cell lung cancer.  She status post 3 cycle of systemic chemotherapy with single agent gemcitabine recently but unfortunately continues to have evidence for disease progression. I have a lengthy discussion with the patient again today about her current condition and treatment options. In addition to the option of palliative care and hospice referral, the patient was given the option of the immunotherapy or treatment with single agent chemotherapy. She was a little bit concerned about the adverse effect of the immunotherapy as written in the consent. The patient did decide to proceed with the immunotherapy clinical trial. She presents today to receive her first treatment. Patient is asked to monitor her episodes of hemoptysis and inform us if they persist or worsen. She'll proceed with cycle #1 of immunotherapy according to that BMS clinical trial with Nivolumab today as scheduled. She'll followup in 2 weeks prior to cycle #2 for another symptom management visit. Patient was instructed how to apply the EMLA cream to her Port-A-Cath.  For the metastatic bone disease, she will continue Zometa on a monthly basis.  She was advised to call immediately if she has any concerning symptoms in the interval. The patient voices understanding of current disease status and treatment options and is in agreement with the current care plan.  All questions were answered. The patient knows to call the clinic with any problems, questions or concerns. We can certainly see the patient much sooner if necessary.  Carlton Adam PA-C  ADDENDUM: Hematology/Oncology Attending: I had a face to face encounter with the patient. I recommended her care plan. This is a very pleasant 63 years old [place] American female with recurrent non-small cell lung cancer status post several chemotherapy regimens and most recently treated with single agent  gemcitabine but had evidence for disease progression. The patient is here today to start the first cycle of immunotherapy with Nivolumab according to the [facility] [id number] clinical trial. The patient is feeling fine and to start the first dose of her treatment as scheduled. She would come back for followup visit in 2 weeks for evaluation and management any adverse effect of her treatment. The patient was advised to call immediately if she has any concerning symptoms in the interval.  Disclaimer: This note was dictated with voice recognition software. Similar sounding words can inadvertently be transcribed and may  not be corrected upon review. Eilleen Kempf., MD 06/13/2013

## 2013-06-08 NOTE — Telephone Encounter (Signed)
Gave pt appt for lab and MD , emailed Sharyn Lull regarsding chemo, cancelled [place] per Rolley Sims, research nurse

## 2013-06-08 NOTE — Telephone Encounter (Signed)
Per staff message and POF I have scheduled appts.  JMW  

## 2013-06-08 NOTE — Patient Instructions (Signed)
[facility] Discharge Instructions for Patients Receiving Chemotherapy  Today you received the following chemotherapy agent Nivolumab.  To help prevent nausea and vomiting after your treatment, we encourage you to take your nausea medication.   If you develop nausea and vomiting that is not controlled by your nausea medication, call the clinic.   BELOW ARE SYMPTOMS THAT SHOULD BE REPORTED IMMEDIATELY:  *FEVER GREATER THAN 100.5 F  *CHILLS WITH OR WITHOUT FEVER  NAUSEA AND VOMITING THAT IS NOT CONTROLLED WITH YOUR NAUSEA MEDICATION  *UNUSUAL SHORTNESS OF BREATH  *UNUSUAL BRUISING OR BLEEDING  TENDERNESS IN MOUTH AND THROAT WITH OR WITHOUT PRESENCE OF ULCERS  *URINARY PROBLEMS  *BOWEL PROBLEMS  UNUSUAL RASH Items with * indicate a potential emergency and should be followed up as soon as possible.  Feel free to call the clinic you have any questions or concerns. The clinic phone number is [phone].

## 2013-06-09 ENCOUNTER — Telehealth: Payer: Self-pay | Admitting: Internal Medicine

## 2013-06-09 NOTE — Telephone Encounter (Signed)
Talked to pt and gave her appt for February 2015 lab,md and chemo

## 2013-06-10 NOTE — Patient Instructions (Signed)
Follow-up in 2 weeks

## 2013-06-14 ENCOUNTER — Ambulatory Visit

## 2013-06-22 ENCOUNTER — Ambulatory Visit (HOSPITAL_BASED_OUTPATIENT_CLINIC_OR_DEPARTMENT_OTHER)

## 2013-06-22 ENCOUNTER — Encounter (INDEPENDENT_AMBULATORY_CARE_PROVIDER_SITE_OTHER): Payer: Self-pay

## 2013-06-22 ENCOUNTER — Other Ambulatory Visit: Payer: Self-pay | Admitting: *Deleted

## 2013-06-22 ENCOUNTER — Encounter: Payer: Self-pay | Admitting: *Deleted

## 2013-06-22 ENCOUNTER — Encounter: Payer: Self-pay | Admitting: Physician Assistant

## 2013-06-22 ENCOUNTER — Ambulatory Visit (HOSPITAL_BASED_OUTPATIENT_CLINIC_OR_DEPARTMENT_OTHER): Admitting: Physician Assistant

## 2013-06-22 ENCOUNTER — Telehealth: Payer: Self-pay | Admitting: Internal Medicine

## 2013-06-22 ENCOUNTER — Other Ambulatory Visit (HOSPITAL_BASED_OUTPATIENT_CLINIC_OR_DEPARTMENT_OTHER)

## 2013-06-22 VITALS — BP 119/58 | HR 111 | Temp 97.4°F | Resp 18 | Ht 66.0 in | Wt 141.6 lb

## 2013-06-22 DIAGNOSIS — C3490 Malignant neoplasm of unspecified part of unspecified bronchus or lung: Secondary | ICD-10-CM

## 2013-06-22 DIAGNOSIS — C7951 Secondary malignant neoplasm of bone: Secondary | ICD-10-CM

## 2013-06-22 DIAGNOSIS — Z5112 Encounter for antineoplastic immunotherapy: Secondary | ICD-10-CM

## 2013-06-22 DIAGNOSIS — C3430 Malignant neoplasm of lower lobe, unspecified bronchus or lung: Secondary | ICD-10-CM

## 2013-06-22 DIAGNOSIS — C7952 Secondary malignant neoplasm of bone marrow: Secondary | ICD-10-CM

## 2013-06-22 DIAGNOSIS — C786 Secondary malignant neoplasm of retroperitoneum and peritoneum: Secondary | ICD-10-CM

## 2013-06-22 DIAGNOSIS — C801 Malignant (primary) neoplasm, unspecified: Secondary | ICD-10-CM

## 2013-06-22 DIAGNOSIS — C781 Secondary malignant neoplasm of mediastinum: Secondary | ICD-10-CM

## 2013-06-22 DIAGNOSIS — C770 Secondary and unspecified malignant neoplasm of lymph nodes of head, face and neck: Secondary | ICD-10-CM

## 2013-06-22 LAB — PHOSPHORUS: PHOSPHORUS: 3.1 mg/dL (ref 2.3–4.6)

## 2013-06-22 LAB — CBC WITH DIFFERENTIAL/PLATELET
BASO%: 0.4 % (ref 0.0–2.0)
Basophils Absolute: 0 10*3/uL (ref 0.0–0.1)
EOS%: 3.4 % (ref 0.0–7.0)
Eosinophils Absolute: 0.2 10*3/uL (ref 0.0–0.5)
HCT: 32.6 % — ABNORMAL LOW (ref 34.8–46.6)
HGB: 10.7 g/dL — ABNORMAL LOW (ref 11.6–15.9)
LYMPH#: 1.2 10*3/uL (ref 0.9–3.3)
LYMPH%: 21.5 % (ref 14.0–49.7)
MCH: 29.3 pg (ref 25.1–34.0)
MCHC: 32.8 g/dL (ref 31.5–36.0)
MCV: 89.5 fL (ref 79.5–101.0)
MONO#: 0.6 10*3/uL (ref 0.1–0.9)
MONO%: 10.8 % (ref 0.0–14.0)
NEUT#: 3.5 10*3/uL (ref 1.5–6.5)
NEUT%: 63.9 % (ref 38.4–76.8)
Platelets: 376 10*3/uL (ref 145–400)
RBC: 3.64 10*6/uL — ABNORMAL LOW (ref 3.70–5.45)
RDW: 15.1 % — ABNORMAL HIGH (ref 11.2–14.5)
WBC: 5.5 10*3/uL (ref 3.9–10.3)

## 2013-06-22 LAB — COMPREHENSIVE METABOLIC PANEL (CC13)
ALK PHOS: 236 U/L — AB (ref 40–150)
ALT: 6 U/L (ref 0–55)
ANION GAP: 13 meq/L — AB (ref 3–11)
AST: 18 U/L (ref 5–34)
Albumin: 3.8 g/dL (ref 3.5–5.0)
BILIRUBIN TOTAL: 0.3 mg/dL (ref 0.20–1.20)
BUN: 14.8 mg/dL (ref 7.0–26.0)
CHLORIDE: 103 meq/L (ref 98–109)
CO2: 23 meq/L (ref 22–29)
Calcium: 9.3 mg/dL (ref 8.4–10.4)
Creatinine: 1.1 mg/dL (ref 0.6–1.1)
GLUCOSE: 103 mg/dL (ref 70–140)
POTASSIUM: 3.5 meq/L (ref 3.5–5.1)
SODIUM: 139 meq/L (ref 136–145)
TOTAL PROTEIN: 8.2 g/dL (ref 6.4–8.3)

## 2013-06-22 LAB — MAGNESIUM (CC13): Magnesium: 1.9 mg/dl (ref 1.5–2.5)

## 2013-06-22 LAB — LACTATE DEHYDROGENASE (CC13): LDH: 289 U/L — AB (ref 125–245)

## 2013-06-22 MED ORDER — SODIUM CHLORIDE 0.9 % IV SOLN
Freq: Once | INTRAVENOUS | Status: AC
  Administered 2013-06-22: 15:00:00 via INTRAVENOUS

## 2013-06-22 MED ORDER — HEPARIN SOD (PORK) LOCK FLUSH 100 UNIT/ML IV SOLN
500.0000 [IU] | Freq: Once | INTRAVENOUS | Status: AC | PRN
  Administered 2013-06-22: 500 [IU]
  Filled 2013-06-22: qty 5

## 2013-06-22 MED ORDER — SODIUM CHLORIDE 0.9 % IJ SOLN
10.0000 mL | INTRAMUSCULAR | Status: DC | PRN
  Administered 2013-06-22: 10 mL
  Filled 2013-06-22: qty 10

## 2013-06-22 MED ORDER — SODIUM CHLORIDE 0.9 % IV SOLN
3.0000 mg/kg | Freq: Once | INTRAVENOUS | Status: AC
  Administered 2013-06-22: 194 mg via INTRAVENOUS
  Filled 2013-06-22: qty 19.4

## 2013-06-22 MED ORDER — ZOLEDRONIC ACID 4 MG/100ML IV SOLN
4.0000 mg | Freq: Once | INTRAVENOUS | Status: AC
  Administered 2013-06-22: 4 mg via INTRAVENOUS
  Filled 2013-06-22: qty 100

## 2013-06-22 NOTE — Telephone Encounter (Signed)
gv adn printed appt sched and avs for pt for March and April.....sed added tx.

## 2013-06-22 NOTE — Patient Instructions (Signed)
Continue to monitor your episodes of hemoptysis Followup in 2 weeks

## 2013-06-22 NOTE — Progress Notes (Signed)
Kaitlin Reed is in the cancer center today for lab work, physical exam, and treatment.  She continues to work. No changes in her medications.  Lab results reviewed with ML and MD prior to treatment.  Sign for infusion given to A. Hassell Done, RN.

## 2013-06-22 NOTE — Patient Instructions (Signed)
[facility] Discharge Instructions for Patients Receiving Chemotherapy  Today you received the following chemotherapy agent Nivolumab and Zometa  To help prevent nausea and vomiting after your treatment, we encourage you to take your nausea medication.   If you develop nausea and vomiting that is not controlled by your nausea medication, call the clinic.   BELOW ARE SYMPTOMS THAT SHOULD BE REPORTED IMMEDIATELY:  *FEVER GREATER THAN 100.5 F  *CHILLS WITH OR WITHOUT FEVER  NAUSEA AND VOMITING THAT IS NOT CONTROLLED WITH YOUR NAUSEA MEDICATION  *UNUSUAL SHORTNESS OF BREATH  *UNUSUAL BRUISING OR BLEEDING  TENDERNESS IN MOUTH AND THROAT WITH OR WITHOUT PRESENCE OF ULCERS  *URINARY PROBLEMS  *BOWEL PROBLEMS  UNUSUAL RASH Items with * indicate a potential emergency and should be followed up as soon as possible.  Feel free to call the clinic you have any questions or concerns. The clinic phone number is [phone].

## 2013-06-22 NOTE — Progress Notes (Addendum)
[place] [phone]   [phone]) [phone]  SHARED VISIT PROGRESS NOTE  [name], MD [place] [address]  DIAGNOSIS: Recurrent non-small cell lung cancer initially diagnosed as stage IIA (T1 N1 MX) adenocarcinoma in April 2011.   PRIOR THERAPY:  1. Status post 3 cycles of neoadjuvant chemotherapy with carboplatin and Alimta. Last dose was given July 23, 2009. 2. Status post left lower lobectomy with lymph node dissection under the care of Dr. Arlyce Dice on October 17, 2009. 3. Status post concurrent chemoradiation with weekly carboplatin and paclitaxel for disease recurrence. Last dose was given May 07, 2010. 4. Status post 3 cycles of consolidation chemotherapy with carboplatin and Alimta. Last dose was given August 13, 2010. 5. Status post palliative radiotherapy to the right neck area. 6. Systemic chemotherapy with single agent Taxotere 75 mg/m2 every 3 weeks,, status post 6 cycles, last dose was given on 09/03/2011. 7. Systemic chemotherapy with carboplatin for an AUC of 5 and Alimta at 500 mg per meter square given every 3 weeks, status post 6 cycles. 8. Maintenance chemotherapy with single agent Alimta 500 mg/M2 every 3 weeks, status post 3 cycles discontinued today secondary to disease progression.  9. Systemic chemotherapy with single agent gemcitabine 1000 mg/M2 on days 1 and 8 every 3 weeks, status post 3 cycles discontinued today secondary to disease progression. First cycle on 03/15/2013.   CURRENT THERAPY:   1)  Immunotherapy according to the BMS clinical trial with Nivolumab. Status post 1 cycle 2) Zometa 4 mg IV every month. First dose given 05/17/2013  CHEMOTHERAPY INTENT: Palliative  CURRENT # OF CHEMOTHERAPY CYCLES: 1  CURRENT ANTIEMETICS: Zofran, dexamethasone and Compazine  CURRENT SMOKING STATUS: Former smoker  ORAL CHEMOTHERAPY AND CONSENT: None  CURRENT BISPHOSPHONATES USE: None  PAIN  MANAGEMENT: None  NARCOTICS INDUCED CONSTIPATION: None  LIVING WILL AND CODE STATUS: Full code initially but no longer term resuscitation.    INTERVAL HISTORY: Kaitlin Reed 63 y.o. female returns to the clinic today for followup visit. She presents to proceed with cycle #2 of immunotherapy with Nivolumab according to the BMS 153 clinical trial. She tolerated her first cycle of immunotherapy with Nivolumab relatively well the exception of fatigue. She has resumed eating a more solid diet with less juicing of her fruits and vegetables and has developed some upper abdominal gas pains. She reports one episode of deep coughing spell which produced about a tablespoon of dark red to brown blood. These episodes are occurring less frequently. She denies shortness of breath and she's had no diarrhea. She has had some mild constipation. She also complains of some left hip pain and soreness as well as some right neck discomfort. She denies any numbness or tingling.  She denied having any fever or chills. She has no nausea or vomiting. The patient denied having any significant chest pain, shortness of breath, other significant cough or other episodes of hemoptysis. She has no weight loss or night sweats.   MEDICAL HISTORY: Past Medical History  Diagnosis Date  . Heart murmur   . Tubal pregnancy   . lung ca dx'd 06/2009    chemo comp 07/2010; xrt comp 06/2010  . Hypercholesteremia     h/o    ALLERGIES:  is allergic to codeine; motrin; other; trazodone and nefazodone; banana; tape; and xgeva.  MEDICATIONS:  Current Outpatient Prescriptions  Medication Sig Dispense Refill  . Ascorbic Acid (VITAMIN C) 1000 MG tablet Take 1,000 mg  by mouth daily.      Marland Kitchen aspirin EC 81 MG tablet Take 81 mg by mouth daily.      . calcium carbonate (OS-CAL) 600 MG TABS Take 600 mg by mouth 2 (two) times daily with a meal.      . Cholecalciferol (VITAMIN D) 2000 UNITS tablet Take 2,000 Units by mouth daily.      . ferrous  sulfate 325 (65 FE) MG tablet Take 325 mg by mouth daily with breakfast.      . metoprolol tartrate (LOPRESSOR) 25 MG tablet Take 25 mg by mouth 2 (two) times daily.      . Multiple Vitamin (MULITIVITAMIN WITH MINERALS) TABS Take 1 tablet by mouth daily.       . potassium chloride (K-DUR,KLOR-CON) 10 MEQ tablet Take 5 mEq by mouth daily.      . Selenium 200 MCG CAPS Take 1 capsule by mouth daily.      . vitamin B-12 (CYANOCOBALAMIN) 1000 MCG tablet Take 1,000 mcg by mouth daily.      . vitamin E 400 UNIT capsule Take 400 Units by mouth daily.      Marland Kitchen zolendronic acid (ZOMETA) 4 MG/5ML injection Inject 4 mg into the vein every 28 (twenty-eight) days.       . [DISCONTINUED] Alum & Mag Hydroxide-Simeth (MAGIC MOUTHWASH W/LIDOCAINE) SOLN Take 10 mLs by mouth 4 (four) times daily as needed.  500 mL  5   No current facility-administered medications for this visit.    SURGICAL HISTORY:  Past Surgical History  Procedure Laterality Date  . Lung removal, partial  10/17/09    left. pt states they removed small piece of lung due to cancer  . 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; for milk tumor"    REVIEW OF SYSTEMS:  Constitutional: positive for fatigue Eyes: negative Ears, nose, mouth, throat, and face: negative Respiratory: positive for hemoptysis Cardiovascular: negative Gastrointestinal: positive for constipation Genitourinary:negative Integument/breast: negative Hematologic/lymphatic: negative Musculoskeletal:positive for arthralgias and myalgias Neurological: negative Behavioral/Psych: negative Endocrine: negative Allergic/Immunologic: negative   PHYSICAL EXAMINATION: General appearance: alert, cooperative and no distress Head: Normocephalic, without obvious abnormality, atraumatic Neck: no adenopathy Lymph nodes: Cervical, supraclavicular, and axillary nodes normal. Resp: clear to auscultation bilaterally Cardio: regular rate and rhythm, S1, S2 normal, no murmur, click, rub or  gallop GI: soft, non-tender; bowel sounds normal; no masses,  no organomegaly Extremities: extremities normal, atraumatic, no cyanosis or edema Neurologic: Alert and oriented X 3, normal strength and tone. Normal symmetric reflexes. Normal coordination and gait Right anterior chest Port-A-Cath, well-healed, no evidence of infection or inflammation  ECOG PERFORMANCE STATUS: 1 - Symptomatic but completely ambulatory  Blood pressure 119/58, pulse 111, temperature 97.4 F (36.3 C), temperature source Oral, resp. rate 18, height 5\' 6"  (1.676 m), weight 141 lb 9.6 oz (64.229 kg), SpO2 97.00%.  LABORATORY DATA: Lab Results  Component Value Date   WBC 5.5 06/22/2013   HGB 10.7* 06/22/2013   HCT 32.6* 06/22/2013   MCV 89.5 06/22/2013   PLT 376 06/22/2013      Chemistry      Component Value Date/Time   NA 139 06/22/2013 1334   NA 139 06/04/2013 1220   NA 138 11/06/2011 1121   K 3.5 06/22/2013 1334   K 4.1 06/04/2013 1220   K 4.3 11/06/2011 1121   CL 101 06/04/2013 1220   CL 105 10/07/2012 1350   CL 98 11/06/2011 1121   CO2 23 06/22/2013 1334   CO2  25 06/04/2013 1220   CO2 30 11/06/2011 1121   BUN 14.8 06/22/2013 1334   BUN 16 06/04/2013 1220   BUN 15 11/06/2011 1121   CREATININE 1.1 06/22/2013 1334   CREATININE 0.87 06/04/2013 1220   CREATININE 0.9 11/06/2011 1121      Component Value Date/Time   CALCIUM 9.3 06/22/2013 1334   CALCIUM 8.9 06/04/2013 1220   CALCIUM 9.0 11/06/2011 1121   ALKPHOS 236* 06/22/2013 1334   ALKPHOS 238* 04/26/2013 1639   ALKPHOS 110* 11/06/2011 1121   AST 18 06/22/2013 1334   AST 16 04/26/2013 1639   AST 36 11/06/2011 1121   ALT 6 06/22/2013 1334   ALT <5 04/26/2013 1639   ALT 23 11/06/2011 1121   BILITOT 0.30 06/22/2013 1334   BILITOT 0.2* 04/26/2013 1639   BILITOT 0.50 11/06/2011 1121       RADIOGRAPHIC STUDIES:    Ct Head W Contrast  05/11/2013   CLINICAL DATA:  Non-small-cell lung cancer, staging.  EXAM: CT HEAD WITHOUT AND WITH CONTRAST  TECHNIQUE: Contiguous axial images  were obtained from the base of the skull through the vertex with intravenous contrast.  CONTRAST:  144mL OMNIPAQUE IOHEXOL 300 MG/ML  SOLN  COMPARISON:  MRI brain 04/17/2011.    CT head 03/27/2011.  FINDINGS: No evidence for acute infarction, hemorrhage, mass lesion, hydrocephalus, or extra-axial fluid. Normal for age cerebral volume. No significant white matter disease. Post infusion, no abnormal enhancement of the brain or meninges. Normal enhancement of the carotid, basilar, and proximal intracerebral vessels.  In the right paramedian clivus there is a 5 x 7 mm sclerotic lesion not present in 2012 with CT appearance similar to the other sclerotic metastases (see for instance left iliac bone CT abdomen and pelvis 07/19/2011). No intracranial extension. No other osseous lesions seen.  IMPRESSION: No acute intracranial findings. Specifically, no intracranial metastatic disease is evident.  Interval development of a sclerotic 5 x 7 mm clival metastasis without apparent intracranial extension.   Electronically Signed   By: Rolla Flatten M.D.   On: 05/11/2013 13:49   Ct Soft Tissue Neck W Contrast  05/11/2013   CLINICAL DATA:  63 year old female with non-small cell lung cancer. History of neck swelling and right neck lymphadenopathy. Restaging. Subsequent encounter.  EXAM: CT NECK WITH CONTRAST  TECHNIQUE: Multidetector CT imaging of the neck was performed using the standard protocol following the bolus administration of intravenous contrast.  CONTRAST:  12mL OMNIPAQUE IOHEXOL 300 MG/ML SOLN in conjunction with CT(s) of the head, chest abdomen and pelvis reported separately.  COMPARISON:  11/06/2011 neck CT.  FINDINGS: Head and chest exams from today reported separately.  Further decreased size of lymph nodes at the right level IIa station (series 10, image 52), 4-5 mm short axis today (6 mm in 2013, bulky adenopathy here in 2012). Remaining cervical nodal levels remain stable and within normal limits.  Negative  thyroid, cervical retropharyngeal space, parapharyngeal spaces, sublingual space, submandibular glands, and parotid glands. Stable and negative pharynx.  The larynx now demonstrates medial deviation of the right vocal fold, and asymmetric enlargement of the right piriform sinus and laryngeal ventricle. Likely this is associated with the disease progression in the upper chest (reported separately).  Visualized orbit soft tissues are within normal limits. Major vascular structures in the neck and at the skullbase remain patent.  Stable and negative paranasal sinuses and mastoids. Clivus findings reported separately. New sclerotic lesion in the C5 posterior superior vertebral body (series 10, image 65), 10 mm  diameter. No posterior cortical breakthrough or epidural involvement. There are also new sclerotic metastases in the visible upper thoracic spine.  IMPRESSION: 1. New sclerotic bone metastases, including in the cervical spine at C5. 2. 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 3. No cervical lymphadenopathy or soft tissue metastasis in the neck. 4. See also head CT, abdomen and pelvis CT reported separately.   Electronically Signed   By: Lars Pinks M.D.   On: 05/11/2013 13:59   Ct Chest W Contrast  05/11/2013   CLINICAL DATA:  History of non-small-cell lung cancer. Restaging scan.  EXAM: CT CHEST, ABDOMEN, AND PELVIS WITH CONTRAST  TECHNIQUE: Multidetector CT imaging of the chest, abdomen and pelvis was performed following the standard protocol during bolus administration of intravenous contrast.  CONTRAST:  14mL OMNIPAQUE IOHEXOL 300 MG/ML  SOLN  COMPARISON:  CT of the chest, abdomen and pelvis 03/10/2013.  FINDINGS:   CT CHEST FINDINGS  Mediastinum: Enlarging prevascular heterogeneously enhancing lymph node measuring 2.1 x 1.8 cm (image 15 of series 5). Enlarging nodal mass in the middle mediastinum in the right  paratracheal region currently measuring 3.8 x 2.9 cm (image 16 of series 5). This exerts significant mass effect upon the trachea slightly distorting the tracheal contour or, without definite direct invasion. No definite new mediastinal or hilar lymph nodes are noted. Heart size is normal. Small to moderate volume of pericardial fluid, slightly increased compared to the prior study. No pericardial calcification. There is atherosclerosis of the thoracic aorta, the great vessels of the mediastinum and the coronary arteries, including calcified atherosclerotic plaque in the left main, left anterior descending and right coronary arteries. Esophagus is unremarkable in appearance.  Lungs/Pleura: Previously noted nodule in the medial aspect of the left upper lobe has increased in size, currently measuring 15 x 10 mm, with spiculated margins and retraction of the overlying pleura. Status post left lower lobectomy. Chronic postoperative architectural distortion and scarring in the posterior aspect of the left upper lobe is similar to the prior examinations. Background of moderate centrilobular emphysema and mild diffuse bronchial wall thickening. No acute consolidative airspace disease. No pleural effusions.  Musculoskeletal: The previously noted bony lesions appear more sclerotic on today's examination, suggesting some bony healing at sites of treated metastases, with the largest lesion noted extending throughout the left side of the T11 vertebral body into the left pedicle measuring approximately 2.6 x 1.4 cm.    CT ABDOMEN AND PELVIS FINDINGS  Abdomen/Pelvis: Right adrenal nodule appears similar to the prior study measuring approximately 2.4 x 2.1 cm, likely to represent a metastasis. Left adrenal gland is unremarkable in appearance. Focal perfusion anomaly in segment 4B of the liver adjacent to the falciform ligament is unchanged. No aggressive appearing hepatic lesions are noted. The appearance of the gallbladder,  pancreas, spleen and right kidney is unremarkable. 1.2 cm well-defined low-attenuation lesion in the lower pole of the left kidney is compatible with a small simple cyst.  Numerous colonic diverticulae are noted, most pronounced in the sigmoid colon, without surrounding inflammatory changes to suggest an acute diverticulitis at this time. No significant volume of ascites. No pneumoperitoneum. No pathologic distention of small bowel. Normal appendix. No definite lymphadenopathy identified within the abdomen or pelvis. Uterus is heterogeneous in appearance with multiple small calcified lesions, likely to represent a fibroid uterus. Ovaries are unremarkable in appearance. Urinary bladder is nearly completely decompressed, but otherwise unremarkable in appearance.  Musculoskeletal: Increasing sclerosis of all of the previously noted bony lesions suggesting some interval bony healing in the setting of treated metastatic disease. The largest of these lesions involve the majority of the left ilium and the left side of the sacrum    IMPRESSION: 1. Progression of mediastinal nodal metastases, as discussed above. 2. Interval enlargement of left upper lobe pulmonary nodule which currently measures 1.5 x 1.0 cm, concerning for an enlarging metastatic lesion, although a second primary bronchogenic carcinoma is difficult to exclude. 3. Right adrenal metastasis appears stable in size. 4. Multifocal skeletal metastatic disease demonstrates increasing sclerosis compared to the prior study, suggesting some positive response to therapy with interval bony healing. No definite pathologic fractures are identified on today's examination. 5. Small to moderate volume of pericardial fluid is slightly increased compared to the prior examination. 6. Atherosclerosis, including left main and 2 vessel coronary artery disease. 7. Colonic diverticulosis without findings to suggest acute diverticulitis at this time.   Electronically Signed   By:  Vinnie Langton M.D.   On: 05/11/2013 13:50   ASSESSMENT AND PLAN: This is a very pleasant 63 years old [place] American female with recurrent non-small cell lung cancer.  She status post 3 cycle of systemic chemotherapy with single agent gemcitabine recently but unfortunately continues to have evidence for disease progression. She is currently participating in the BMS immunotherapy clinical probably with Nivolumab, status post 1 cycle. Patient was discussed with and also seen by Dr. Julien Nordmann. She is asked to continue to monitor her episodes of hemoptysis and inform us immediately if they persist or worsen. She may try an over-the-counter products such as Gas-X for her increased gaseousness. She'll proceed with cycle #2 today as scheduled and return in 2 weeks prior to cycle #3.   For the metastatic bone disease, she will continue Zometa on a monthly basis.  She was advised to call immediately if she has any concerning symptoms in the interval. The patient voices understanding of current disease status and treatment options and is in agreement with the current care plan.  All questions were answered. The patient knows to call the clinic with any problems, questions or concerns. We can certainly see the patient much sooner if necessary.  Carlton Adam PA-C  ADDENDUM: Hematology/Oncology Attending: I had the face to face encounter with the patient today. I recommended her care plan. This is a very pleasant 63 years old [place] American female with metastatic non-small cell lung cancer, adenocarcinoma status post several chemotherapy regimens. She is currently on treatment with immunotherapy with Nivolumab status post 1 cycle. She tolerated the first cycle of her treatment fairly well with no significant adverse effects. She denied having any significant skin rash or diarrhea. She denied having any nausea or vomiting. She continues to have mild fatigue. I recommended for the patient to proceed  with cycle #2 today as scheduled. She would come back for followup visit in 3 weeks with the next cycle of her treatment. She will continue her monthly Zometa for metastatic bone disease. The patient was advised to call immediately if she has any concerning symptoms in the interval.  Disclaimer: This note was dictated with voice recognition software. Similar sounding words can inadvertently be transcribed and may not be corrected upon review. Eilleen Kempf., MD 06/22/2013

## 2013-07-05 ENCOUNTER — Telehealth: Payer: Self-pay | Admitting: *Deleted

## 2013-07-05 NOTE — Telephone Encounter (Signed)
Pt called stating that she is having some discomfort in her port.  She denies any pus/drainage or redness at the site.  She states she called radiology and they asked her to stop by tomorrow if she is still having issued.  Advised she talk to [place] about it tomorrow at f/u appt as well.  She verbalized understanding.  SLJ

## 2013-07-06 ENCOUNTER — Telehealth: Payer: Self-pay | Admitting: Internal Medicine

## 2013-07-06 ENCOUNTER — Other Ambulatory Visit: Payer: Self-pay | Admitting: *Deleted

## 2013-07-06 ENCOUNTER — Encounter: Payer: Self-pay | Admitting: Physician Assistant

## 2013-07-06 ENCOUNTER — Ambulatory Visit

## 2013-07-06 ENCOUNTER — Encounter: Payer: Self-pay | Admitting: *Deleted

## 2013-07-06 ENCOUNTER — Ambulatory Visit (HOSPITAL_BASED_OUTPATIENT_CLINIC_OR_DEPARTMENT_OTHER): Payer: Medicare Other | Admitting: Physician Assistant

## 2013-07-06 ENCOUNTER — Other Ambulatory Visit (HOSPITAL_BASED_OUTPATIENT_CLINIC_OR_DEPARTMENT_OTHER): Payer: Medicare Other

## 2013-07-06 ENCOUNTER — Ambulatory Visit (HOSPITAL_BASED_OUTPATIENT_CLINIC_OR_DEPARTMENT_OTHER): Payer: Medicare Other

## 2013-07-06 VITALS — BP 115/52 | HR 118 | Temp 98.5°F | Resp 18 | Ht 66.0 in | Wt 137.9 lb

## 2013-07-06 DIAGNOSIS — C7952 Secondary malignant neoplasm of bone marrow: Secondary | ICD-10-CM

## 2013-07-06 DIAGNOSIS — C3430 Malignant neoplasm of lower lobe, unspecified bronchus or lung: Secondary | ICD-10-CM

## 2013-07-06 DIAGNOSIS — C3490 Malignant neoplasm of unspecified part of unspecified bronchus or lung: Secondary | ICD-10-CM

## 2013-07-06 DIAGNOSIS — R042 Hemoptysis: Secondary | ICD-10-CM

## 2013-07-06 DIAGNOSIS — C7951 Secondary malignant neoplasm of bone: Secondary | ICD-10-CM

## 2013-07-06 DIAGNOSIS — Z5112 Encounter for antineoplastic immunotherapy: Secondary | ICD-10-CM

## 2013-07-06 LAB — COMPREHENSIVE METABOLIC PANEL (CC13)
ALT: <6 U/L (ref 0–55)
ANION GAP: 12 meq/L — AB (ref 3–11)
AST: 18 U/L (ref 5–34)
Albumin: 3.6 g/dL (ref 3.5–5.0)
Alkaline Phosphatase: 248 U/L — ABNORMAL HIGH (ref 40–150)
BUN: 12.2 mg/dL (ref 7.0–26.0)
CHLORIDE: 105 meq/L (ref 98–109)
CO2: 23 meq/L (ref 22–29)
Calcium: 9.4 mg/dL (ref 8.4–10.4)
Creatinine: 0.9 mg/dL (ref 0.6–1.1)
Glucose: 105 mg/dl (ref 70–140)
Potassium: 3.9 mEq/L (ref 3.5–5.1)
SODIUM: 139 meq/L (ref 136–145)
TOTAL PROTEIN: 8 g/dL (ref 6.4–8.3)
Total Bilirubin: 0.26 mg/dL (ref 0.20–1.20)

## 2013-07-06 LAB — CBC WITH DIFFERENTIAL/PLATELET
BASO%: 0.5 % (ref 0.0–2.0)
Basophils Absolute: 0 10*3/uL (ref 0.0–0.1)
EOS%: 1.3 % (ref 0.0–7.0)
Eosinophils Absolute: 0.1 10*3/uL (ref 0.0–0.5)
HCT: 30.3 % — ABNORMAL LOW (ref 34.8–46.6)
HGB: 10 g/dL — ABNORMAL LOW (ref 11.6–15.9)
LYMPH#: 0.8 10*3/uL — AB (ref 0.9–3.3)
LYMPH%: 12.9 % — ABNORMAL LOW (ref 14.0–49.7)
MCH: 29 pg (ref 25.1–34.0)
MCHC: 32.9 g/dL (ref 31.5–36.0)
MCV: 87.9 fL (ref 79.5–101.0)
MONO#: 0.6 10*3/uL (ref 0.1–0.9)
MONO%: 9 % (ref 0.0–14.0)
NEUT#: 4.8 10*3/uL (ref 1.5–6.5)
NEUT%: 76.3 % (ref 38.4–76.8)
Platelets: 407 10*3/uL — ABNORMAL HIGH (ref 145–400)
RBC: 3.45 10*6/uL — AB (ref 3.70–5.45)
RDW: 15.1 % — ABNORMAL HIGH (ref 11.2–14.5)
WBC: 6.3 10*3/uL (ref 3.9–10.3)

## 2013-07-06 LAB — LACTATE DEHYDROGENASE (CC13): LDH: 310 U/L — ABNORMAL HIGH (ref 125–245)

## 2013-07-06 LAB — PHOSPHORUS: Phosphorus: 3.3 mg/dL (ref 2.3–4.6)

## 2013-07-06 LAB — RESEARCH LABS

## 2013-07-06 LAB — MAGNESIUM (CC13): MAGNESIUM: 1.9 mg/dL (ref 1.5–2.5)

## 2013-07-06 MED ORDER — SODIUM CHLORIDE 0.9 % IV SOLN
Freq: Once | INTRAVENOUS | Status: AC
  Administered 2013-07-06: 16:00:00 via INTRAVENOUS

## 2013-07-06 MED ORDER — SODIUM CHLORIDE 0.9 % IJ SOLN
10.0000 mL | INTRAMUSCULAR | Status: DC | PRN
  Administered 2013-07-06: 10 mL
  Filled 2013-07-06: qty 10

## 2013-07-06 MED ORDER — HEPARIN SOD (PORK) LOCK FLUSH 100 UNIT/ML IV SOLN
500.0000 [IU] | Freq: Once | INTRAVENOUS | Status: AC | PRN
  Administered 2013-07-06: 500 [IU]
  Filled 2013-07-06: qty 5

## 2013-07-06 MED ORDER — SODIUM CHLORIDE 0.9 % IV SOLN
3.0000 mg/kg | Freq: Once | INTRAVENOUS | Status: AC
  Administered 2013-07-06: 194 mg via INTRAVENOUS
  Filled 2013-07-06: qty 19.4

## 2013-07-06 MED ORDER — TEMAZEPAM 15 MG PO CAPS: 15.0000 mg | ORAL_CAPSULE | Freq: Every evening | ORAL | Status: DC | PRN

## 2013-07-06 NOTE — Telephone Encounter (Signed)
gv pt appt schedule for march/april. pt on AJ schedule 3/24 due to MM out.

## 2013-07-06 NOTE — Progress Notes (Addendum)
[place] [phone]   [phone]  SHARED VISIT PROGRESS NOTE  Kaitlin Riddle, MD [place] [address]  DIAGNOSIS: Recurrent non-small cell lung cancer initially diagnosed as stage IIA (T1 N1 MX) adenocarcinoma in April 2011.   PRIOR THERAPY:  1. Status post 3 cycles of neoadjuvant chemotherapy with carboplatin and Alimta. Last dose was given July 23, 2009. 2. Status post left lower lobectomy with lymph node dissection under the care of Dr. Arlyce Dice on October 17, 2009. 3. Status post concurrent chemoradiation with weekly carboplatin and paclitaxel for disease recurrence. Last dose was given May 07, 2010. 4. Status post 3 cycles of consolidation chemotherapy with carboplatin and Alimta. Last dose was given August 13, 2010. 5. Status post palliative radiotherapy to the right neck area. 6. Systemic chemotherapy with single agent Taxotere 75 mg/m2 every 3 weeks,, status post 6 cycles, last dose was given on 09/03/2011. 7. Systemic chemotherapy with carboplatin for an AUC of 5 and Alimta at 500 mg per meter square given every 3 weeks, status post 6 cycles. 8. Maintenance chemotherapy with single agent Alimta 500 mg/M2 every 3 weeks, status post 3 cycles discontinued today secondary to disease progression.  9. Systemic chemotherapy with single agent gemcitabine 1000 mg/M2 on days 1 and 8 every 3 weeks, status post 3 cycles discontinued today secondary to disease progression. First cycle on 03/15/2013.   CURRENT THERAPY:   1)  Immunotherapy according to the BMS clinical trial with Nivolumab. Status post 2 cycles 2) Zometa 4 mg IV every month. First dose given 05/17/2013  CHEMOTHERAPY INTENT: Palliative  CURRENT # OF CHEMOTHERAPY CYCLES: 3  CURRENT ANTIEMETICS: Zofran, dexamethasone and Compazine  CURRENT SMOKING STATUS: Former smoker  ORAL CHEMOTHERAPY AND CONSENT: None  CURRENT BISPHOSPHONATES USE: None  PAIN  MANAGEMENT: None  NARCOTICS INDUCED CONSTIPATION: None  LIVING WILL AND CODE STATUS: Full code initially but no longer term resuscitation.    INTERVAL HISTORY: Kaitlin Reed 63 y.o. female returns to the clinic today for followup visit. She presents to proceed with cycle #3 of immunotherapy with Nivolumab according to the BMS 153 clinical trial. She tolerated her first cycle of immunotherapy with Nivolumab relatively well the exception of increased fatigue. She reports an episode of "rattling in my chest" that occur this morning. This was followed by a coughing spell that produced "a lot" of bright red to dark blood, approximately a quarter of a cup. She is now coughing up small amounts brownish secretions. She did not have any fever or chills. After the rattling in the chest episodes the and coughing up the blood she had no further rattling in her chest. She's had intermittent episodes of hemoptysis before however now they are occurring less frequently. She reports she's currently being treated by Dr. Sabra Heck for left eye infection on Prolensa eyedrops. She was initially having some soreness with her Port-A-Cath but this has resolved.  She denies shortness of breath and she's had no diarrhea or skin rash. S She denies any numbness or tingling.  She denied having any fever or chills. She has no nausea or vomiting. The patient denied having any significant chest pain, shortness of breath, other significant cough or other episodes of hemoptysis. She has some mild weight loss but no night sweats.   MEDICAL HISTORY: Past Medical History  Diagnosis Date  . Heart murmur   . Tubal pregnancy   . lung ca dx'd 06/2009    chemo comp  07/2010; xrt comp 06/2010  . Hypercholesteremia     h/o    ALLERGIES:  is allergic to codeine; motrin; other; trazodone and nefazodone; banana; tape; and xgeva.  MEDICATIONS:  Current Outpatient Prescriptions  Medication Sig Dispense Refill  . Ascorbic Acid (VITAMIN C) 1000  MG tablet Take 1,000 mg by mouth daily.      Marland Kitchen aspirin EC 81 MG tablet Take 81 mg by mouth daily.      . Bromfenac Sodium (PROLENSA) 0.07 % SOLN Apply to eye.      . calcium carbonate (OS-CAL) 600 MG TABS Take 600 mg by mouth 2 (two) times daily with a meal.      . Cholecalciferol (VITAMIN D) 2000 UNITS tablet Take 2,000 Units by mouth daily.      . ferrous sulfate 325 (65 FE) MG tablet Take 325 mg by mouth daily with breakfast.      . metoprolol tartrate (LOPRESSOR) 25 MG tablet Take 25 mg by mouth 2 (two) times daily.      . Multiple Vitamin (MULITIVITAMIN WITH MINERALS) TABS Take 1 tablet by mouth daily.       . potassium chloride (K-DUR,KLOR-CON) 10 MEQ tablet Take 5 mEq by mouth daily.      . Selenium 200 MCG CAPS Take 1 capsule by mouth daily.      . vitamin B-12 (CYANOCOBALAMIN) 1000 MCG tablet Take 1,000 mcg by mouth daily.      . vitamin E 400 UNIT capsule Take 400 Units by mouth daily.      Marland Kitchen zolendronic acid (ZOMETA) 4 MG/5ML injection Inject 4 mg into the vein every 28 (twenty-eight) days.       . temazepam (RESTORIL) 15 MG capsule Take 1 capsule (15 mg total) by mouth at bedtime as needed for sleep.  30 capsule  0  . [DISCONTINUED] Alum & Mag Hydroxide-Simeth (MAGIC MOUTHWASH W/LIDOCAINE) SOLN Take 10 mLs by mouth 4 (four) times daily as needed.  500 mL  5   No current facility-administered medications for this visit.    SURGICAL HISTORY:  Past Surgical History  Procedure Laterality Date  . Lung removal, partial  10/17/09    left. pt states they removed small piece of lung due to cancer  . 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; for milk tumor"    REVIEW OF SYSTEMS:  Constitutional: positive for fatigue and weight loss Eyes: negative Ears, nose, mouth, throat, and face: negative Respiratory: positive for cough and hemoptysis Cardiovascular: negative Gastrointestinal: negative Genitourinary:negative Integument/breast: negative Hematologic/lymphatic:  negative Musculoskeletal:negative Neurological: negative Behavioral/Psych: negative Endocrine: negative Allergic/Immunologic: negative   PHYSICAL EXAMINATION: General appearance: alert, cooperative and no distress Head: Normocephalic, without obvious abnormality, atraumatic Neck: no adenopathy Lymph nodes: Cervical, supraclavicular, and axillary nodes normal. Resp: clear to auscultation bilaterally Cardio: regular rate and rhythm, S1, S2 normal, no murmur, click, rub or gallop GI: soft, non-tender; bowel sounds normal; no masses,  no organomegaly Extremities: extremities normal, atraumatic, no cyanosis or edema Neurologic: Alert and oriented X 3, normal strength and tone. Normal symmetric reflexes. Normal coordination and gait Right anterior chest Port-A-Cath, well-healed, no evidence of infection or inflammation, non-tender  ECOG PERFORMANCE STATUS: 1 - Symptomatic but completely ambulatory  Blood pressure 115/52, pulse 118, temperature 98.5 F (36.9 C), temperature source Oral, resp. rate 18, height 5\' 6"  (1.676 m), weight 137 lb 14.4 oz (62.551 kg), SpO2 97.00%.  LABORATORY DATA: Lab Results  Component Value Date   WBC 6.3 07/06/2013   HGB  10.0* 07/06/2013   HCT 30.3* 07/06/2013   MCV 87.9 07/06/2013   PLT 407* 07/06/2013      Chemistry      Component Value Date/Time   NA 139 07/06/2013 1352   NA 139 06/04/2013 1220   NA 138 11/06/2011 1121   K 3.9 07/06/2013 1352   K 4.1 06/04/2013 1220   K 4.3 11/06/2011 1121   CL 101 06/04/2013 1220   CL 105 10/07/2012 1350   CL 98 11/06/2011 1121   CO2 23 07/06/2013 1352   CO2 25 06/04/2013 1220   CO2 30 11/06/2011 1121   BUN 12.2 07/06/2013 1352   BUN 16 06/04/2013 1220   BUN 15 11/06/2011 1121   CREATININE 0.9 07/06/2013 1352   CREATININE 0.87 06/04/2013 1220   CREATININE 0.9 11/06/2011 1121      Component Value Date/Time   CALCIUM 9.4 07/06/2013 1352   CALCIUM 8.9 06/04/2013 1220   CALCIUM 9.0 11/06/2011 1121   ALKPHOS 248* 07/06/2013 1352    ALKPHOS 238* 04/26/2013 1639   ALKPHOS 110* 11/06/2011 1121   AST 18 07/06/2013 1352   AST 16 04/26/2013 1639   AST 36 11/06/2011 1121   ALT <6 07/06/2013 1352   ALT <5 04/26/2013 1639   ALT 23 11/06/2011 1121   BILITOT 0.26 07/06/2013 1352   BILITOT 0.2* 04/26/2013 1639   BILITOT 0.50 11/06/2011 1121       RADIOGRAPHIC STUDIES:    Ct Head W Contrast  05/11/2013   CLINICAL DATA:  Non-small-cell lung cancer, staging.  EXAM: CT HEAD WITHOUT AND WITH CONTRAST  TECHNIQUE: Contiguous axial images were obtained from the base of the skull through the vertex with intravenous contrast.  CONTRAST:  147mL OMNIPAQUE IOHEXOL 300 MG/ML  SOLN  COMPARISON:  MRI brain 04/17/2011.    CT head 03/27/2011.  FINDINGS: No evidence for acute infarction, hemorrhage, mass lesion, hydrocephalus, or extra-axial fluid. Normal for age cerebral volume. No significant white matter disease. Post infusion, no abnormal enhancement of the brain or meninges. Normal enhancement of the carotid, basilar, and proximal intracerebral vessels.  In the right paramedian clivus there is a 5 x 7 mm sclerotic lesion not present in 2012 with CT appearance similar to the other sclerotic metastases (see for instance left iliac bone CT abdomen and pelvis 07/19/2011). No intracranial extension. No other osseous lesions seen.  IMPRESSION: No acute intracranial findings. Specifically, no intracranial metastatic disease is evident.  Interval development of a sclerotic 5 x 7 mm clival metastasis without apparent intracranial extension.   Electronically Signed   By: Rolla Flatten M.D.   On: 05/11/2013 13:49   Ct Soft Tissue Neck W Contrast  05/11/2013   CLINICAL DATA:  63 year old female with non-small cell lung cancer. History of neck swelling and right neck lymphadenopathy. Restaging. Subsequent encounter.  EXAM: CT NECK WITH CONTRAST  TECHNIQUE: Multidetector CT imaging of the neck was performed using the standard protocol following the bolus administration  of intravenous contrast.  CONTRAST:  120mL OMNIPAQUE IOHEXOL 300 MG/ML SOLN in conjunction with CT(s) of the head, chest abdomen and pelvis reported separately.  COMPARISON:  11/06/2011 neck CT.  FINDINGS: Head and chest exams from today reported separately.  Further decreased size of lymph nodes at the right level IIa station (series 10, image 52), 4-5 mm short axis today (6 mm in 2013, bulky adenopathy here in 2012). Remaining cervical nodal levels remain stable and within normal limits.  Negative thyroid, cervical retropharyngeal space, parapharyngeal spaces, sublingual space, submandibular  glands, and parotid glands. Stable and negative pharynx.  The larynx now demonstrates medial deviation of the right vocal fold, and asymmetric enlargement of the right piriform sinus and laryngeal ventricle. Likely this is associated with the disease progression in the upper chest (reported separately).  Visualized orbit soft tissues are within normal limits. Major vascular structures in the neck and at the skullbase remain patent.  Stable and negative paranasal sinuses and mastoids. Clivus findings reported separately. New sclerotic lesion in the C5 posterior superior vertebral body (series 10, image 65), 10 mm diameter. No posterior cortical breakthrough or epidural involvement. There are also new sclerotic metastases in the visible upper thoracic spine.  IMPRESSION: 1. New sclerotic bone metastases, including in the cervical spine at C5. 2. 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 3. No cervical lymphadenopathy or soft tissue metastasis in the neck. 4. See also head CT, abdomen and pelvis CT reported separately.   Electronically Signed   By: Lars Pinks M.D.   On: 05/11/2013 13:59   Ct Chest W Contrast  05/11/2013   CLINICAL DATA:  History of non-small-cell lung cancer. Restaging scan.  EXAM: CT CHEST, ABDOMEN, AND PELVIS WITH CONTRAST   TECHNIQUE: Multidetector CT imaging of the chest, abdomen and pelvis was performed following the standard protocol during bolus administration of intravenous contrast.  CONTRAST:  120mL OMNIPAQUE IOHEXOL 300 MG/ML  SOLN  COMPARISON:  CT of the chest, abdomen and pelvis 03/10/2013.  FINDINGS:   CT CHEST FINDINGS  Mediastinum: Enlarging prevascular heterogeneously enhancing lymph node measuring 2.1 x 1.8 cm (image 15 of series 5). Enlarging nodal mass in the middle mediastinum in the right paratracheal region currently measuring 3.8 x 2.9 cm (image 16 of series 5). This exerts significant mass effect upon the trachea slightly distorting the tracheal contour or, without definite direct invasion. No definite new mediastinal or hilar lymph nodes are noted. Heart size is normal. Small to moderate volume of pericardial fluid, slightly increased compared to the prior study. No pericardial calcification. There is atherosclerosis of the thoracic aorta, the great vessels of the mediastinum and the coronary arteries, including calcified atherosclerotic plaque in the left main, left anterior descending and right coronary arteries. Esophagus is unremarkable in appearance.  Lungs/Pleura: Previously noted nodule in the medial aspect of the left upper lobe has increased in size, currently measuring 15 x 10 mm, with spiculated margins and retraction of the overlying pleura. Status post left lower lobectomy. Chronic postoperative architectural distortion and scarring in the posterior aspect of the left upper lobe is similar to the prior examinations. Background of moderate centrilobular emphysema and mild diffuse bronchial wall thickening. No acute consolidative airspace disease. No pleural effusions.  Musculoskeletal: The previously noted bony lesions appear more sclerotic on today's examination, suggesting some bony healing at sites of treated metastases, with the largest lesion noted extending throughout the left side of the T11  vertebral body into the left pedicle measuring approximately 2.6 x 1.4 cm.    CT ABDOMEN AND PELVIS FINDINGS  Abdomen/Pelvis: Right adrenal nodule appears similar to the prior study measuring approximately 2.4 x 2.1 cm, likely to represent a metastasis. Left adrenal gland is unremarkable in appearance. Focal perfusion anomaly in segment 4B of the liver adjacent to the falciform ligament is unchanged. No aggressive appearing hepatic lesions are noted. The appearance of the gallbladder, pancreas, spleen and right kidney is unremarkable. 1.2 cm well-defined low-attenuation lesion in the  lower pole of the left kidney is compatible with a small simple cyst.  Numerous colonic diverticulae are noted, most pronounced in the sigmoid colon, without surrounding inflammatory changes to suggest an acute diverticulitis at this time. No significant volume of ascites. No pneumoperitoneum. No pathologic distention of small bowel. Normal appendix. No definite lymphadenopathy identified within the abdomen or pelvis. Uterus is heterogeneous in appearance with multiple small calcified lesions, likely to represent a fibroid uterus. Ovaries are unremarkable in appearance. Urinary bladder is nearly completely decompressed, but otherwise unremarkable in appearance.  Musculoskeletal: Increasing sclerosis of all of the previously noted bony lesions suggesting some interval bony healing in the setting of treated metastatic disease. The largest of these lesions involve the majority of the left ilium and the left side of the sacrum    IMPRESSION: 1. Progression of mediastinal nodal metastases, as discussed above. 2. Interval enlargement of left upper lobe pulmonary nodule which currently measures 1.5 x 1.0 cm, concerning for an enlarging metastatic lesion, although a second primary bronchogenic carcinoma is difficult to exclude. 3. Right adrenal metastasis appears stable in size. 4. Multifocal skeletal metastatic disease demonstrates  increasing sclerosis compared to the prior study, suggesting some positive response to therapy with interval bony healing. No definite pathologic fractures are identified on today's examination. 5. Small to moderate volume of pericardial fluid is slightly increased compared to the prior examination. 6. Atherosclerosis, including left main and 2 vessel coronary artery disease. 7. Colonic diverticulosis without findings to suggest acute diverticulitis at this time.   Electronically Signed   By: Vinnie Langton M.D.   On: 05/11/2013 13:50   ASSESSMENT AND PLAN: This is a very pleasant 63 years old [place] American female with recurrent non-small cell lung cancer.  She status post 3 cycle of systemic chemotherapy with single agent gemcitabine recently but unfortunately continues to have evidence for disease progression. She is currently participating in the BMS immunotherapy clinical probably with Nivolumab, status post 2 cycles. Patient was discussed with and also seen by Dr. Julien Nordmann. She is asked to continue to monitor her episodes of hemoptysis and inform us immediately if they persist or worsen. . She'll proceed with cycle #3 today as scheduled and return in 2 weeks prior to cycle #4.   For the metastatic bone disease, she will continue Zometa on a monthly basis.  She was advised to call immediately if she has any concerning symptoms in the interval. The patient voices understanding of current disease status and treatment options and is in agreement with the current care plan.  All questions were answered. The patient knows to call the clinic with any problems, questions or concerns. We can certainly see the patient much sooner if necessary.  Carlton Adam PA-C  ADDENDUM: Hematology/Oncology Attending: I had a face to face encounter with the patient today. I recommended her care plan. This is a very pleasant 63 years old [place] American female with recurrent non-small cell lung cancer,  adenocarcinoma. The patient is currently undergoing treatment with immunotherapy according to the BMS [place] 209153 clinical trial with Nivolumab status post 2 cycles. She is rating her treatment fairly well with no significant adverse effects. She had fewer episodes of mild hemoptysis recently but very small amount. This could be related to her disease but the patient underwent extensive radiotherapy to the chest before and I am not sure if she would be eligible for any further radiotherapy to the lung. I recommended for her to proceed with her immunotherapy today as  scheduled. She was advised to monitor her hemoptysis very closely and if she has any significant amount of hemoptysis to go immediately to the emergency Department for evaluation as the patient may need repeat bronchoscopy to rule out any endobronchi lesion.  She agreed to the current plan. She was advised to call immediately if she has any concerning symptoms in the interval.  Disclaimer: This note was dictated with voice recognition software. Similar sounding words can inadvertently be transcribed and may not be corrected upon review. Eilleen Kempf., MD 07/06/2013

## 2013-07-06 NOTE — Progress Notes (Signed)
07/06/13 [id number] Cycle 3 Kaitlin Reed is in the cancer center today for lab work, physical exam, and treatment on the BMS 153 clinical trial. She reports she has had an episode of hemoptysis today after coughing.  It has stopped. She is bothered by fatigue and insomnia.   Lab results reviewed with Dr. Julien Nordmann. She meets criteria for treatment today. Sign for infusion given to T. Elias Else, Therapist, sports.

## 2013-07-06 NOTE — Patient Instructions (Signed)
Continue to monitor your episodes of hemoptysis Followup in 2 weeks

## 2013-07-06 NOTE — Patient Instructions (Signed)
[facility] Discharge Instructions for Patients Receiving Chemotherapy  Today you received the following chemotherapy agents : Nivolumab.  To help prevent nausea and vomiting after your treatment, we encourage you to take your nausea medication as instructed by your physician.   If you develop nausea and vomiting that is not controlled by your nausea medication, call the clinic.   BELOW ARE SYMPTOMS THAT SHOULD BE REPORTED IMMEDIATELY:  *FEVER GREATER THAN 100.5 F  *CHILLS WITH OR WITHOUT FEVER  NAUSEA AND VOMITING THAT IS NOT CONTROLLED WITH YOUR NAUSEA MEDICATION  *UNUSUAL SHORTNESS OF BREATH  *UNUSUAL BRUISING OR BLEEDING  TENDERNESS IN MOUTH AND THROAT WITH OR WITHOUT PRESENCE OF ULCERS  *URINARY PROBLEMS  *BOWEL PROBLEMS  UNUSUAL RASH Items with * indicate a potential emergency and should be followed up as soon as possible.  Feel free to call the clinic you have any questions or concerns. The clinic phone number is [phone].

## 2013-07-13 ENCOUNTER — Emergency Department (HOSPITAL_COMMUNITY): Payer: Medicare Other

## 2013-07-13 ENCOUNTER — Encounter (HOSPITAL_COMMUNITY): Payer: Self-pay | Admitting: Emergency Medicine

## 2013-07-13 ENCOUNTER — Emergency Department (HOSPITAL_COMMUNITY)
Admission: EM | Admit: 2013-07-13 | Discharge: 2013-07-13 | Disposition: A | Discharge status: Home / Self Care | Payer: Medicare Other | Attending: Emergency Medicine | Admitting: Emergency Medicine

## 2013-07-13 DIAGNOSIS — Z7982 Long term (current) use of aspirin: Secondary | ICD-10-CM | POA: Insufficient documentation

## 2013-07-13 DIAGNOSIS — M79609 Pain in unspecified limb: Secondary | ICD-10-CM | POA: Insufficient documentation

## 2013-07-13 DIAGNOSIS — C801 Malignant (primary) neoplasm, unspecified: Secondary | ICD-10-CM | POA: Insufficient documentation

## 2013-07-13 DIAGNOSIS — R042 Hemoptysis: Secondary | ICD-10-CM | POA: Insufficient documentation

## 2013-07-13 DIAGNOSIS — E7800 Pure hypercholesterolemia, unspecified: Secondary | ICD-10-CM | POA: Insufficient documentation

## 2013-07-13 DIAGNOSIS — Z79899 Other long term (current) drug therapy: Secondary | ICD-10-CM | POA: Insufficient documentation

## 2013-07-13 DIAGNOSIS — Z87891 Personal history of nicotine dependence: Secondary | ICD-10-CM | POA: Insufficient documentation

## 2013-07-13 DIAGNOSIS — Z9221 Personal history of antineoplastic chemotherapy: Secondary | ICD-10-CM | POA: Insufficient documentation

## 2013-07-13 LAB — CBC WITH DIFFERENTIAL/PLATELET
Basophils Absolute: 0 10*3/uL (ref 0.0–0.1)
Basophils Relative: 0 % (ref 0–1)
Eosinophils Absolute: 0.1 10*3/uL (ref 0.0–0.7)
Eosinophils Relative: 2 % (ref 0–5)
HCT: 28 % — ABNORMAL LOW (ref 36.0–46.0)
Hemoglobin: 9.3 g/dL — ABNORMAL LOW (ref 12.0–15.0)
Lymphocytes Relative: 20 % (ref 12–46)
Lymphs Abs: 1.1 10*3/uL (ref 0.7–4.0)
MCH: 28.4 pg (ref 26.0–34.0)
MCHC: 33.2 g/dL (ref 30.0–36.0)
MCV: 85.6 fL (ref 78.0–100.0)
Monocytes Absolute: 0.6 10*3/uL (ref 0.1–1.0)
Monocytes Relative: 10 % (ref 3–12)
Neutro Abs: 3.7 10*3/uL (ref 1.7–7.7)
Neutrophils Relative %: 67 % (ref 43–77)
Platelets: 382 10*3/uL (ref 150–400)
RBC: 3.27 MIL/uL — ABNORMAL LOW (ref 3.87–5.11)
RDW: 13.7 % (ref 11.5–15.5)
WBC: 5.5 10*3/uL (ref 4.0–10.5)

## 2013-07-13 LAB — BASIC METABOLIC PANEL
BUN: 17 mg/dL (ref 6–23)
CO2: 24 mEq/L (ref 19–32)
Calcium: 10 mg/dL (ref 8.4–10.5)
Chloride: 97 mEq/L (ref 96–112)
Creatinine, Ser: 0.96 mg/dL (ref 0.50–1.10)
GFR calc Af Amer: 71 mL/min — ABNORMAL LOW (ref >90)
GFR calc non Af Amer: 61 mL/min — ABNORMAL LOW (ref >90)
Glucose, Bld: 107 mg/dL — ABNORMAL HIGH (ref 70–99)
Potassium: 3.7 mEq/L (ref 3.7–5.3)
Sodium: 136 mEq/L — ABNORMAL LOW (ref 137–147)

## 2013-07-13 MED ORDER — IOHEXOL 350 MG/ML SOLN
100.0000 mL | Freq: Once | INTRAVENOUS | Status: AC | PRN
  Administered 2013-07-13: 100 mL via INTRAVENOUS

## 2013-07-13 NOTE — ED Notes (Addendum)
Patient has a history of lung cancer and states she has been having blood in her sputum, mostly bright red. Patient states it is less now than what it was an hour ago. Patient states this occurred before and Dr. Julien Nordmann told her to come to the ED and he would probably have her scoped.

## 2013-07-13 NOTE — Discharge Instructions (Signed)
Return here as needed.  Increase your fluid intake.  Followup with your oncologist

## 2013-07-13 NOTE — ED Provider Notes (Signed)
Medical screening examination/treatment/procedure(s) were performed by non-physician practitioner and as supervising physician I was immediately available for consultation/collaboration.   EKG Interpretation None       Threasa Beards, MD 07/13/13 2351

## 2013-07-13 NOTE — ED Provider Notes (Signed)
CSN: [id number]     Arrival date & time 07/13/13  8242 History   First MD Initiated Contact with Patient 07/13/13 2121     Chief Complaint  Patient presents with  . Hemoptysis     (Consider location/radiation/quality/duration/timing/severity/associated sxs/prior Treatment) HPI: Kaitlin Reed is a 63 year old female currently undergoing chemotherapy for NSCLC who presents to the Emergency Department with chief complaint of coughing up blood.  She states that she had several episodes of hemoptysis one week ago, and her oncologist told her to report to the ED if it recurred.  She explains that she felt fine and went to work today as usual, but when she got home around 2pm she felt a "rattle" in her chest and began to cough up "a lot" of bright red blood.  She states that she became short of breath while she was having coughing fits, but that she is not short of breath otherwise.  She denies dizziness, weakness, and fever.  She states that she is not coughing up as much blood now as she was earlier, although her sputum is still streaked with bright red blood.  Kaitlin Reed also reports 3 days of pain in her lateral left lower leg.  She denies trauma to the area.  She states that there was a swollen, red area that has since resolved.  However, the area is still painful to palpation, 7/10 in severity with radiation up to the hip.    Past Medical History  Diagnosis Date  . Heart murmur   . Tubal pregnancy   . lung ca dx'd 06/2009    chemo comp 07/2010; xrt comp 06/2010  . Hypercholesteremia     h/o   Past Surgical History  Procedure Laterality Date  . Lung removal, partial  10/17/09    left. pt states they removed small piece of lung due to cancer  . 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 Hypertension Father   . Cancer Sister     breast   History  Substance Use Topics  . Smoking status: Former Smoker -- 0.50 packs/day for 26 years     Types: Cigarettes    Quit date: 04/29/1982  . Smokeless tobacco: Never Used     Comment: quit smoking cigarettes 1980's  . Alcohol Use: No   OB History   Grav Para Term Preterm Abortions TAB SAB Ect Mult Living   3 2        2      Review of Systems    Allergies  Codeine; Motrin; Other; Trazodone and nefazodone; Banana; Tape; and Xgeva  Home Medications   Current Outpatient Rx  Name  Route  Sig  Dispense  Refill  . Ascorbic Acid (VITAMIN C) 1000 MG tablet   Oral   Take 1,000 mg by mouth daily.         Marland Kitchen aspirin EC 81 MG tablet   Oral   Take 81 mg by mouth daily.         . Bromfenac Sodium (PROLENSA) 0.07 % SOLN   Left Eye   Place 1 drop into the left eye daily.          . calcium carbonate (OS-CAL) 600 MG TABS   Oral   Take 600 mg by mouth 2 (two) times daily with a meal.         . Cholecalciferol (VITAMIN D) 2000 UNITS  tablet   Oral   Take 2,000 Units by mouth daily.         . ferrous sulfate 325 (65 FE) MG tablet   Oral   Take 325 mg by mouth daily with breakfast.         . metoprolol tartrate (LOPRESSOR) 25 MG tablet   Oral   Take 25 mg by mouth 2 (two) times daily.         . Multiple Vitamin (MULITIVITAMIN WITH MINERALS) TABS   Oral   Take 1 tablet by mouth daily.          . potassium chloride (K-DUR,KLOR-CON) 10 MEQ tablet   Oral   Take 5 mEq by mouth daily.         . Selenium 200 MCG CAPS   Oral   Take 1 capsule by mouth daily.         . temazepam (RESTORIL) 15 MG capsule   Oral   Take 1 capsule (15 mg total) by mouth at bedtime as needed for sleep.   30 capsule   0   . vitamin B-12 (CYANOCOBALAMIN) 1000 MCG tablet   Oral   Take 1,000 mcg by mouth daily.         . vitamin E 400 UNIT capsule   Oral   Take 400 Units by mouth daily.         Marland Kitchen zolendronic acid (ZOMETA) 4 MG/5ML injection   Intravenous   Inject 4 mg into the vein every 28 (twenty-eight) days.           BP 115/60  Pulse 96  Temp(Src) 98.1  F (36.7 C) (Oral)  Resp 20  SpO2 97% Physical Exam  Nursing note and vitals reviewed. Constitutional: She is oriented to person, place, and time. She appears well-developed and well-nourished. No distress.  HENT:  Head: Normocephalic and atraumatic.  Mouth/Throat: Oropharynx is clear and moist.  Eyes: Pupils are equal, round, and reactive to light.  Neck: Normal range of motion. Neck supple.  Cardiovascular: Normal rate, regular rhythm and normal heart sounds.  Exam reveals no gallop and no friction rub.   No murmur heard. Pulmonary/Chest: Effort normal and breath sounds normal. No respiratory distress. She has no wheezes. She has no rales. She exhibits no tenderness.  Abdominal: Soft. Bowel sounds are normal. She exhibits no distension. There is no tenderness.  Musculoskeletal: She exhibits no edema and no tenderness.  Tender to palpation of lateral left leg approximately half way to the knee.  No erythema or swelling noted.  Neurological: She is alert and oriented to person, place, and time. She exhibits normal muscle tone. Coordination normal.  Skin: Skin is warm and dry. No rash noted. No erythema.    ED Course  Procedures (including critical care time) Labs Review Labs Reviewed  BASIC METABOLIC PANEL  CBC WITH DIFFERENTIAL   Imaging Review Ct Angio Chest Pe W/cm &/or Wo Cm  07/13/2013   CLINICAL DATA:  History of lung cancer.  Hemoptysis.  EXAM: CT ANGIOGRAPHY CHEST WITH CONTRAST  TECHNIQUE: Multidetector CT imaging of the chest was performed using the standard protocol during bolus administration of intravenous contrast. Multiplanar CT image reconstructions and MIPs were obtained to evaluate the vascular anatomy.  CONTRAST:  100 mL OMNIPAQUE IOHEXOL 350 MG/ML SOLN  COMPARISON:  CT chest, CT chest 05/11/2013.  FINDINGS: No pulmonary embolus is identified. Moderate pericardial effusion is unchanged. Trace amount of pleural fluid on the left is noted. No right pleural effusion.  Anterior mediastinal lymph node which had measured 2.1 x 1.8 cm today measures 2.1 x 1.9 cm on image 29. Large right paratracheal nodal mass measures 3.9 x 3.0 cm on image 27 compared to 3.8 x 2.9 cm.  Lungs again demonstrate extensive centrilobular emphysematous disease. The patient is status post left lower lobectomy with associated architectural distortion and volume loss, unchanged. Nodule in the anterior aspect of the left upper lobe which had measured 1.5 x 1.0 cm today measures 2.0 x 1.5 cm on image 49.  Visualized upper abdomen is unremarkable. Bones again demonstrate multiple sclerotic lesions consistent with metastatic is unchanged. The appearance is not markedly changed.  Review of the MIP images confirms the above findings.  IMPRESSION: Negative for pulmonary embolus.  Slight increase in mediastinal lymphadenopathy.  Increased size of a left upper lobe pulmonary nodule.  No marked change in a moderate pericardial effusion.  No change in osseous metastatic disease.   Electronically Signed   By: Inge Rise M.D.   On: 07/13/2013 23:38    Patient is given the results of her testing.  She is advised to return here for any worsening in her condition and also advised the patient to call her cancer doctor in the morning.  For further evaluation and recheck.  The patient agrees with plan and all questions were answered      Brent General, PA-C 07/13/13 2350

## 2013-07-14 ENCOUNTER — Telehealth: Payer: Self-pay | Admitting: *Deleted

## 2013-07-14 NOTE — Telephone Encounter (Signed)
Pt called wanting Dr Vista Mink to review CT angio she had on 3/17 when she was in the ED for hemoptysis.  Per Dr Vista Mink, CT looked okay but she may need referral to a pulmonologist.  She does not currently have one.  Dr Vista Mink will address this at f/u on 3/24.  Pt is aware and is to call back if any issues come up before her f/u with AJ on 3/24.  SLJ

## 2013-07-19 ENCOUNTER — Other Ambulatory Visit: Payer: Self-pay | Admitting: Internal Medicine

## 2013-07-19 ENCOUNTER — Other Ambulatory Visit: Payer: Self-pay | Admitting: *Deleted

## 2013-07-19 DIAGNOSIS — C3490 Malignant neoplasm of unspecified part of unspecified bronchus or lung: Secondary | ICD-10-CM

## 2013-07-20 ENCOUNTER — Other Ambulatory Visit: Payer: Self-pay | Admitting: *Deleted

## 2013-07-20 ENCOUNTER — Ambulatory Visit (HOSPITAL_BASED_OUTPATIENT_CLINIC_OR_DEPARTMENT_OTHER): Payer: Medicare Other | Admitting: Physician Assistant

## 2013-07-20 ENCOUNTER — Other Ambulatory Visit (HOSPITAL_BASED_OUTPATIENT_CLINIC_OR_DEPARTMENT_OTHER): Payer: Medicare Other

## 2013-07-20 ENCOUNTER — Ambulatory Visit (HOSPITAL_BASED_OUTPATIENT_CLINIC_OR_DEPARTMENT_OTHER)

## 2013-07-20 ENCOUNTER — Telehealth: Payer: Self-pay | Admitting: Internal Medicine

## 2013-07-20 ENCOUNTER — Encounter: Payer: Self-pay | Admitting: Physician Assistant

## 2013-07-20 ENCOUNTER — Encounter: Payer: Self-pay | Admitting: *Deleted

## 2013-07-20 VITALS — BP 115/52 | HR 103 | Temp 97.5°F | Resp 18 | Ht 66.0 in | Wt 136.3 lb

## 2013-07-20 VITALS — BP 112/64

## 2013-07-20 DIAGNOSIS — C3490 Malignant neoplasm of unspecified part of unspecified bronchus or lung: Secondary | ICD-10-CM

## 2013-07-20 DIAGNOSIS — Z5111 Encounter for antineoplastic chemotherapy: Secondary | ICD-10-CM

## 2013-07-20 DIAGNOSIS — C7952 Secondary malignant neoplasm of bone marrow: Secondary | ICD-10-CM

## 2013-07-20 DIAGNOSIS — C7951 Secondary malignant neoplasm of bone: Secondary | ICD-10-CM

## 2013-07-20 DIAGNOSIS — C801 Malignant (primary) neoplasm, unspecified: Secondary | ICD-10-CM

## 2013-07-20 DIAGNOSIS — C770 Secondary and unspecified malignant neoplasm of lymph nodes of head, face and neck: Secondary | ICD-10-CM

## 2013-07-20 DIAGNOSIS — C3430 Malignant neoplasm of lower lobe, unspecified bronchus or lung: Secondary | ICD-10-CM

## 2013-07-20 LAB — COMPREHENSIVE METABOLIC PANEL (CC13)
ALT: <6 U/L (ref 0–55)
AST: 20 U/L (ref 5–34)
Albumin: 3.7 g/dL (ref 3.5–5.0)
Alkaline Phosphatase: 269 U/L — ABNORMAL HIGH (ref 40–150)
Anion Gap: 11 mEq/L (ref 3–11)
BUN: 20.7 mg/dL (ref 7.0–26.0)
CALCIUM: 10.1 mg/dL (ref 8.4–10.4)
CHLORIDE: 102 meq/L (ref 98–109)
CO2: 24 meq/L (ref 22–29)
Creatinine: 1.3 mg/dL — ABNORMAL HIGH (ref 0.6–1.1)
GLUCOSE: 95 mg/dL (ref 70–140)
POTASSIUM: 3.8 meq/L (ref 3.5–5.1)
SODIUM: 138 meq/L (ref 136–145)
TOTAL PROTEIN: 8.4 g/dL — AB (ref 6.4–8.3)
Total Bilirubin: 0.21 mg/dL (ref 0.20–1.20)

## 2013-07-20 LAB — CBC WITH DIFFERENTIAL/PLATELET
BASO%: 1.1 % (ref 0.0–2.0)
BASOS ABS: 0.1 10*3/uL (ref 0.0–0.1)
EOS ABS: 0.1 10*3/uL (ref 0.0–0.5)
EOS%: 1.4 % (ref 0.0–7.0)
HCT: 30.4 % — ABNORMAL LOW (ref 34.8–46.6)
HGB: 10 g/dL — ABNORMAL LOW (ref 11.6–15.9)
LYMPH#: 1.2 10*3/uL (ref 0.9–3.3)
LYMPH%: 16.1 % (ref 14.0–49.7)
MCH: 28.5 pg (ref 25.1–34.0)
MCHC: 32.8 g/dL (ref 31.5–36.0)
MCV: 86.9 fL (ref 79.5–101.0)
MONO#: 0.7 10*3/uL (ref 0.1–0.9)
MONO%: 9.2 % (ref 0.0–14.0)
NEUT%: 72.2 % (ref 38.4–76.8)
NEUTROS ABS: 5.2 10*3/uL (ref 1.5–6.5)
Platelets: 432 10*3/uL — ABNORMAL HIGH (ref 145–400)
RBC: 3.5 10*6/uL — AB (ref 3.70–5.45)
RDW: 14.9 % — AB (ref 11.2–14.5)
WBC: 7.2 10*3/uL (ref 3.9–10.3)

## 2013-07-20 LAB — PHOSPHORUS: PHOSPHORUS: 4.2 mg/dL (ref 2.3–4.6)

## 2013-07-20 LAB — T3, FREE: T3 FREE: 2.6 pg/mL (ref 2.3–4.2)

## 2013-07-20 LAB — TSH CHCC: TSH: 4.73 m[IU]/L — AB (ref 0.308–3.960)

## 2013-07-20 LAB — MAGNESIUM (CC13): Magnesium: 2.2 mg/dl (ref 1.5–2.5)

## 2013-07-20 LAB — LACTATE DEHYDROGENASE (CC13): LDH: 401 U/L — ABNORMAL HIGH (ref 125–245)

## 2013-07-20 LAB — T4, FREE: Free T4: 1.02 ng/dL (ref 0.80–1.80)

## 2013-07-20 MED ORDER — SODIUM CHLORIDE 0.9 % IV SOLN
Freq: Once | INTRAVENOUS | Status: AC
  Administered 2013-07-20: 16:00:00 via INTRAVENOUS

## 2013-07-20 MED ORDER — SODIUM CHLORIDE 0.9 % IJ SOLN
10.0000 mL | INTRAMUSCULAR | Status: DC | PRN
  Administered 2013-07-20: 10 mL
  Filled 2013-07-20: qty 10

## 2013-07-20 MED ORDER — LIDOCAINE-PRILOCAINE 2.5-2.5 % EX CREA
TOPICAL_CREAM | CUTANEOUS | Status: AC
  Filled 2013-07-20: qty 5

## 2013-07-20 MED ORDER — SODIUM CHLORIDE 0.9 % IV SOLN
3.0000 mg/kg | Freq: Once | INTRAVENOUS | Status: AC
  Administered 2013-07-20: 194 mg via INTRAVENOUS
  Filled 2013-07-20: qty 19.4

## 2013-07-20 MED ORDER — HEPARIN SOD (PORK) LOCK FLUSH 100 UNIT/ML IV SOLN
500.0000 [IU] | Freq: Once | INTRAVENOUS | Status: AC | PRN
  Administered 2013-07-20: 500 [IU]
  Filled 2013-07-20: qty 5

## 2013-07-20 MED ORDER — ZOLEDRONIC ACID 4 MG/100ML IV SOLN
4.0000 mg | Freq: Once | INTRAVENOUS | Status: AC
  Administered 2013-07-20: 4 mg via INTRAVENOUS
  Filled 2013-07-20: qty 100

## 2013-07-20 NOTE — Progress Notes (Addendum)
[place] [phone]   [phone]  OFFICE PROGRESS NOTE  [name], MD [place] [address]  DIAGNOSIS: Recurrent non-small cell lung cancer initially diagnosed as stage IIA (T1 N1 MX) adenocarcinoma in April 2011.   PRIOR THERAPY:  1. Status post 3 cycles of neoadjuvant chemotherapy with carboplatin and Alimta. Last dose was given July 23, 2009. 2. Status post left lower lobectomy with lymph node dissection under the care of Dr. Arlyce Dice on October 17, 2009. 3. Status post concurrent chemoradiation with weekly carboplatin and paclitaxel for disease recurrence. Last dose was given May 07, 2010. 4. Status post 3 cycles of consolidation chemotherapy with carboplatin and Alimta. Last dose was given August 13, 2010. 5. Status post palliative radiotherapy to the right neck area. 6. Systemic chemotherapy with single agent Taxotere 75 mg/m2 every 3 weeks,, status post 6 cycles, last dose was given on 09/03/2011. 7. Systemic chemotherapy with carboplatin for an AUC of 5 and Alimta at 500 mg per meter square given every 3 weeks, status post 6 cycles. 8. Maintenance chemotherapy with single agent Alimta 500 mg/M2 every 3 weeks, status post 3 cycles discontinued today secondary to disease progression.  9. Systemic chemotherapy with single agent gemcitabine 1000 mg/M2 on days 1 and 8 every 3 weeks, status post 3 cycles discontinued today secondary to disease progression. First cycle on 03/15/2013.   CURRENT THERAPY:   1)  Immunotherapy according to the BMS clinical trial with Nivolumab. Status post 2 cycles 2) Zometa 4 mg IV every month. First dose given 05/17/2013  CHEMOTHERAPY INTENT: Palliative  CURRENT # OF CHEMOTHERAPY CYCLES: 3  CURRENT ANTIEMETICS: Zofran, dexamethasone and Compazine  CURRENT SMOKING STATUS: Former smoker  ORAL CHEMOTHERAPY AND CONSENT: None  CURRENT BISPHOSPHONATES USE: None  PAIN MANAGEMENT:  None  NARCOTICS INDUCED CONSTIPATION: None  LIVING WILL AND CODE STATUS: Full code initially but no longer term resuscitation.    INTERVAL HISTORY: Kaitlin Reed 63 y.o. female returns to the clinic today for followup visit. She presents to proceed with cycle #4 of immunotherapy with Nivolumab according to the BMS 153 clinical trial. She reports another episode of hemoptysis and was evaluated in the emergency Department. She had a CT angiogram that was negative for pulmonary embolus. The emergency room physician suggested that she might be seen by pulmonologist for further evaluation or hemoptysis. She reports she had another episode of hemoptysis this morning however this was the first episode she had since being seen in the emergency department on 07/13/2013. She complains of about a month's history of left buttock pain as well as mid epigastric discomfort. She is a scheduled appointment with her primary care physician on 07/22/2013 implants discuss these issues with him at that visit. She denies shortness of breath and she's had no diarrhea or skin rash.  She denies any numbness or tingling.  She denied having any fever or chills. She has no nausea or vomiting. The patient denied having any significant chest pain, shortness of breath, or other significant cough. She has some mild weight loss but no night sweats.   MEDICAL HISTORY: Past Medical History  Diagnosis Date  . Heart murmur   . Tubal pregnancy   . lung ca dx'd 06/2009    chemo comp 07/2010; xrt comp 06/2010  . Hypercholesteremia     h/o    ALLERGIES:  is allergic to codeine; motrin; other; trazodone and nefazodone; banana; tape; and xgeva.  MEDICATIONS:  Current Outpatient Prescriptions  Medication Sig Dispense Refill  . Ascorbic Acid (VITAMIN C) 1000 MG tablet Take 1,000 mg by mouth daily.      Marland Kitchen aspirin EC 81 MG tablet Take 81 mg by mouth daily.      . Bromfenac Sodium (PROLENSA) 0.07 % SOLN Place 1 drop into the left eye  daily.       . Cholecalciferol (VITAMIN D) 2000 UNITS tablet Take 2,000 Units by mouth daily.      . ferrous sulfate 325 (65 FE) MG tablet Take 325 mg by mouth daily with breakfast.      . metoprolol tartrate (LOPRESSOR) 25 MG tablet Take 25 mg by mouth 2 (two) times daily.      . Multiple Vitamin (MULITIVITAMIN WITH MINERALS) TABS Take 1 tablet by mouth daily.       . potassium chloride (K-DUR,KLOR-CON) 10 MEQ tablet Take 5 mEq by mouth daily.      . Selenium 200 MCG CAPS Take 1 capsule by mouth daily.      . temazepam (RESTORIL) 15 MG capsule Take 1 capsule (15 mg total) by mouth at bedtime as needed for sleep.  30 capsule  0  . vitamin B-12 (CYANOCOBALAMIN) 1000 MCG tablet Take 1,000 mcg by mouth daily.      . vitamin E 400 UNIT capsule Take 400 Units by mouth daily.      Marland Kitchen zolendronic acid (ZOMETA) 4 MG/5ML injection Inject 4 mg into the vein every 28 (twenty-eight) days.       . calcium carbonate (OS-CAL) 600 MG TABS Take 600 mg by mouth 2 (two) times daily with a meal.      . [DISCONTINUED] Alum & Mag Hydroxide-Simeth (MAGIC MOUTHWASH W/LIDOCAINE) SOLN Take 10 mLs by mouth 4 (four) times daily as needed.  500 mL  5   No current facility-administered medications for this visit.   Facility-Administered Medications Ordered in Other Visits  Medication Dose Route Frequency Provider Last Rate Last Dose  . 0.9 %  sodium chloride infusion   Intravenous Once Curt Bears, MD      . heparin lock flush 100 unit/mL  500 Units Intracatheter Once PRN Curt Bears, MD      . nivolumab (BMS [id number]) 194 mg in sodium chloride 0.9 % 100 mL chemo infusion  3 mg/kg (Treatment Plan Actual) Intravenous Once Curt Bears, MD      . sodium chloride 0.9 % injection 10 mL  10 mL Intracatheter PRN Curt Bears, MD      . Zoledronic Acid (ZOMETA) 4 mg IVPB  4 mg Intravenous Once Curt Bears, MD 400 mL/hr at 07/20/13 1551 4 mg at 07/20/13 1551    SURGICAL HISTORY:  Past Surgical History   Procedure Laterality Date  . Lung removal, partial  10/17/09    left. pt states they removed small piece of lung due to cancer  . 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; for milk tumor"    REVIEW OF SYSTEMS:  Constitutional: positive for fatigue and weight loss Eyes: negative Ears, nose, mouth, throat, and face: negative Respiratory: positive for cough and hemoptysis Cardiovascular: negative Gastrointestinal: positive for epigastric discomfort Genitourinary:negative Integument/breast: negative Hematologic/lymphatic: negative Musculoskeletal:positive for arthralgias and myalgias Neurological: negative Behavioral/Psych: negative Endocrine: negative Allergic/Immunologic: negative   PHYSICAL EXAMINATION: General appearance: alert, cooperative and no distress Head: Normocephalic, without obvious abnormality, atraumatic Neck: no adenopathy Lymph nodes: Cervical, supraclavicular, and axillary nodes normal. Resp: clear to auscultation bilaterally Cardio: regular rate and rhythm,  S1, S2 normal, no murmur, click, rub or gallop GI: soft, non-tender; bowel sounds normal; no masses,  no organomegaly Extremities: extremities normal, atraumatic, no cyanosis or edema Neurologic: Alert and oriented X 3, normal strength and tone. Normal symmetric reflexes. Normal coordination and gait Right anterior chest Port-A-Cath, well-healed, no evidence of infection or inflammation, non-tender  ECOG PERFORMANCE STATUS: 1 - Symptomatic but completely ambulatory  Blood pressure 115/52, pulse 103, temperature 97.5 F (36.4 C), temperature source Oral, resp. rate 18, height 5\' 6"  (1.676 m), weight 136 lb 4.8 oz (61.825 kg), SpO2 98.00%.  LABORATORY DATA: Lab Results  Component Value Date   WBC 7.2 07/20/2013   HGB 10.0* 07/20/2013   HCT 30.4* 07/20/2013   MCV 86.9 07/20/2013   PLT 432* 07/20/2013      Chemistry      Component Value Date/Time   NA 138 07/20/2013 1354   NA 136* 07/13/2013 2005   NA 138  11/06/2011 1121   K 3.8 07/20/2013 1354   K 3.7 07/13/2013 2005   K 4.3 11/06/2011 1121   CL 97 07/13/2013 2005   CL 105 10/07/2012 1350   CL 98 11/06/2011 1121   CO2 24 07/20/2013 1354   CO2 24 07/13/2013 2005   CO2 30 11/06/2011 1121   BUN 20.7 07/20/2013 1354   BUN 17 07/13/2013 2005   BUN 15 11/06/2011 1121   CREATININE 1.3* 07/20/2013 1354   CREATININE 0.96 07/13/2013 2005   CREATININE 0.9 11/06/2011 1121      Component Value Date/Time   CALCIUM 10.1 07/20/2013 1354   CALCIUM 10.0 07/13/2013 2005   CALCIUM 9.0 11/06/2011 1121   ALKPHOS 269* 07/20/2013 1354   ALKPHOS 238* 04/26/2013 1639   ALKPHOS 110* 11/06/2011 1121   AST 20 07/20/2013 1354   AST 16 04/26/2013 1639   AST 36 11/06/2011 1121   ALT <6 07/20/2013 1354   ALT <5 04/26/2013 1639   ALT 23 11/06/2011 1121   BILITOT 0.21 07/20/2013 1354   BILITOT 0.2* 04/26/2013 1639   BILITOT 0.50 11/06/2011 1121       RADIOGRAPHIC STUDIES:    Ct Head W Contrast  05/11/2013   CLINICAL DATA:  Non-small-cell lung cancer, staging.  EXAM: CT HEAD WITHOUT AND WITH CONTRAST  TECHNIQUE: Contiguous axial images were obtained from the base of the skull through the vertex with intravenous contrast.  CONTRAST:  116mL OMNIPAQUE IOHEXOL 300 MG/ML  SOLN  COMPARISON:  MRI brain 04/17/2011.    CT head 03/27/2011.  FINDINGS: No evidence for acute infarction, hemorrhage, mass lesion, hydrocephalus, or extra-axial fluid. Normal for age cerebral volume. No significant white matter disease. Post infusion, no abnormal enhancement of the brain or meninges. Normal enhancement of the carotid, basilar, and proximal intracerebral vessels.  In the right paramedian clivus there is a 5 x 7 mm sclerotic lesion not present in 2012 with CT appearance similar to the other sclerotic metastases (see for instance left iliac bone CT abdomen and pelvis 07/19/2011). No intracranial extension. No other osseous lesions seen.  IMPRESSION: No acute intracranial findings. Specifically, no  intracranial metastatic disease is evident.  Interval development of a sclerotic 5 x 7 mm clival metastasis without apparent intracranial extension.   Electronically Signed   By: Rolla Flatten M.D.   On: 05/11/2013 13:49   Ct Soft Tissue Neck W Contrast  05/11/2013   CLINICAL DATA:  64 year old female with non-small cell lung cancer. History of neck swelling and right neck lymphadenopathy. Restaging. Subsequent encounter.  EXAM:  CT NECK WITH CONTRAST  TECHNIQUE: Multidetector CT imaging of the neck was performed using the standard protocol following the bolus administration of intravenous contrast.  CONTRAST:  1101mL OMNIPAQUE IOHEXOL 300 MG/ML SOLN in conjunction with CT(s) of the head, chest abdomen and pelvis reported separately.  COMPARISON:  11/06/2011 neck CT.  FINDINGS: Head and chest exams from today reported separately.  Further decreased size of lymph nodes at the right level IIa station (series 10, image 52), 4-5 mm short axis today (6 mm in 2013, bulky adenopathy here in 2012). Remaining cervical nodal levels remain stable and within normal limits.  Negative thyroid, cervical retropharyngeal space, parapharyngeal spaces, sublingual space, submandibular glands, and parotid glands. Stable and negative pharynx.  The larynx now demonstrates medial deviation of the right vocal fold, and asymmetric enlargement of the right piriform sinus and laryngeal ventricle. Likely this is associated with the disease progression in the upper chest (reported separately).  Visualized orbit soft tissues are within normal limits. Major vascular structures in the neck and at the skullbase remain patent.  Stable and negative paranasal sinuses and mastoids. Clivus findings reported separately. New sclerotic lesion in the C5 posterior superior vertebral body (series 10, image 65), 10 mm diameter. No posterior cortical breakthrough or epidural involvement. There are also new sclerotic metastases in the visible upper thoracic  spine.  IMPRESSION: 1. New sclerotic bone metastases, including in the cervical spine at C5. 2. 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 3. No cervical lymphadenopathy or soft tissue metastasis in the neck. 4. See also head CT, abdomen and pelvis CT reported separately.   Electronically Signed   By: Lars Pinks M.D.   On: 05/11/2013 13:59   Ct Chest W Contrast  05/11/2013   CLINICAL DATA:  History of non-small-cell lung cancer. Restaging scan.  EXAM: CT CHEST, ABDOMEN, AND PELVIS WITH CONTRAST  TECHNIQUE: Multidetector CT imaging of the chest, abdomen and pelvis was performed following the standard protocol during bolus administration of intravenous contrast.  CONTRAST:  11mL OMNIPAQUE IOHEXOL 300 MG/ML  SOLN  COMPARISON:  CT of the chest, abdomen and pelvis 03/10/2013.  FINDINGS:   CT CHEST FINDINGS  Mediastinum: Enlarging prevascular heterogeneously enhancing lymph node measuring 2.1 x 1.8 cm (image 15 of series 5). Enlarging nodal mass in the middle mediastinum in the right paratracheal region currently measuring 3.8 x 2.9 cm (image 16 of series 5). This exerts significant mass effect upon the trachea slightly distorting the tracheal contour or, without definite direct invasion. No definite new mediastinal or hilar lymph nodes are noted. Heart size is normal. Small to moderate volume of pericardial fluid, slightly increased compared to the prior study. No pericardial calcification. There is atherosclerosis of the thoracic aorta, the great vessels of the mediastinum and the coronary arteries, including calcified atherosclerotic plaque in the left main, left anterior descending and right coronary arteries. Esophagus is unremarkable in appearance.  Lungs/Pleura: Previously noted nodule in the medial aspect of the left upper lobe has increased in size, currently measuring 15 x 10 mm, with spiculated margins and retraction of  the overlying pleura. Status post left lower lobectomy. Chronic postoperative architectural distortion and scarring in the posterior aspect of the left upper lobe is similar to the prior examinations. Background of moderate centrilobular emphysema and mild diffuse bronchial wall thickening. No acute consolidative airspace disease. No pleural effusions.  Musculoskeletal: The previously noted bony lesions appear more sclerotic on  today's examination, suggesting some bony healing at sites of treated metastases, with the largest lesion noted extending throughout the left side of the T11 vertebral body into the left pedicle measuring approximately 2.6 x 1.4 cm.    CT ABDOMEN AND PELVIS FINDINGS  Abdomen/Pelvis: Right adrenal nodule appears similar to the prior study measuring approximately 2.4 x 2.1 cm, likely to represent a metastasis. Left adrenal gland is unremarkable in appearance. Focal perfusion anomaly in segment 4B of the liver adjacent to the falciform ligament is unchanged. No aggressive appearing hepatic lesions are noted. The appearance of the gallbladder, pancreas, spleen and right kidney is unremarkable. 1.2 cm well-defined low-attenuation lesion in the lower pole of the left kidney is compatible with a small simple cyst.  Numerous colonic diverticulae are noted, most pronounced in the sigmoid colon, without surrounding inflammatory changes to suggest an acute diverticulitis at this time. No significant volume of ascites. No pneumoperitoneum. No pathologic distention of small bowel. Normal appendix. No definite lymphadenopathy identified within the abdomen or pelvis. Uterus is heterogeneous in appearance with multiple small calcified lesions, likely to represent a fibroid uterus. Ovaries are unremarkable in appearance. Urinary bladder is nearly completely decompressed, but otherwise unremarkable in appearance.  Musculoskeletal: Increasing sclerosis of all of the previously noted bony lesions suggesting  some interval bony healing in the setting of treated metastatic disease. The largest of these lesions involve the majority of the left ilium and the left side of the sacrum    IMPRESSION: 1. Progression of mediastinal nodal metastases, as discussed above. 2. Interval enlargement of left upper lobe pulmonary nodule which currently measures 1.5 x 1.0 cm, concerning for an enlarging metastatic lesion, although a second primary bronchogenic carcinoma is difficult to exclude. 3. Right adrenal metastasis appears stable in size. 4. Multifocal skeletal metastatic disease demonstrates increasing sclerosis compared to the prior study, suggesting some positive response to therapy with interval bony healing. No definite pathologic fractures are identified on today's examination. 5. Small to moderate volume of pericardial fluid is slightly increased compared to the prior examination. 6. Atherosclerosis, including left main and 2 vessel coronary artery disease. 7. Colonic diverticulosis without findings to suggest acute diverticulitis at this time.   Electronically Signed   By: Vinnie Langton M.D.   On: 05/11/2013 13:50   ASSESSMENT AND PLAN: This is a very pleasant 63 years old [place] American female with recurrent non-small cell lung cancer.  She status post 3 cycle of systemic chemotherapy with single agent gemcitabine recently but unfortunately continues to have evidence for disease progression. She is currently participating in the BMS immunotherapy clinical probably with Nivolumab, status post 3 cycles.  She is asked to continue to monitor her episodes of hemoptysis and inform us immediately if they persist or worsen. I will review the patient with Dr. Julien Nordmann regarding her recurrent episodes of hemoptysis and potential pulmonology referral for a repeat bronchoscopy. She'll proceed with cycle #4 today as scheduled and return in 2 weeks prior to cycle #5. She will restaging CT scans done as per protocol.   For the  metastatic bone disease, she will continue Zometa on a monthly basis.  She was advised to call immediately if she has any concerning symptoms in the interval. The patient voices understanding of current disease status and treatment options and is in agreement with the current care plan.  All questions were answered. The patient knows to call the clinic with any problems, questions or concerns. We can certainly see the patient much sooner  if necessary.  Chessa Barrasso E, PA-C  Addendum: Patient's TSH became available in a slightly elevated at 4.730. Per protocol a free T3 and free T4 was also checked and those results are pending from today.  Disclaimer: This note was dictated with voice recognition software. Similar sounding words can inadvertently be transcribed and may not be corrected upon review.

## 2013-07-20 NOTE — Telephone Encounter (Signed)
gv pt appt schedule for march thru april. ct already on schedule and central will call pt to confirm.

## 2013-07-20 NOTE — Patient Instructions (Signed)
Followup in 2 weeks prior to your next scheduled cycle of immunotherapy Keep your appointment with your primary care physician regarding your left buttock pain and epigastric discomfort

## 2013-07-20 NOTE — Patient Instructions (Signed)
[facility] Discharge Instructions for Patients Receiving Chemotherapy  Today you received the following chemotherapy agents: nivolumab  To help prevent nausea and vomiting after your treatment, we encourage you to take your nausea medication.  Take it as often as prescribed.     If you develop nausea and vomiting that is not controlled by your nausea medication, call the clinic. If it is after clinic hours your family physician or the after hours number for the clinic or go to the Emergency Department.   BELOW ARE SYMPTOMS THAT SHOULD BE REPORTED IMMEDIATELY:  *FEVER GREATER THAN 100.5 F  *CHILLS WITH OR WITHOUT FEVER  NAUSEA AND VOMITING THAT IS NOT CONTROLLED WITH YOUR NAUSEA MEDICATION  *UNUSUAL SHORTNESS OF BREATH  *UNUSUAL BRUISING OR BLEEDING  TENDERNESS IN MOUTH AND THROAT WITH OR WITHOUT PRESENCE OF ULCERS  *URINARY PROBLEMS  *BOWEL PROBLEMS  UNUSUAL RASH Items with * indicate a potential emergency and should be followed up as soon as possible.  Feel free to call the clinic you have any questions or concerns. The clinic phone number is [phone].   I have been informed and understand all the instructions given to me. I know to contact the clinic, my physician, or go to the Emergency Department if any problems should occur. I do not have any questions at this time, but understand that I may call the clinic during office hours   should I have any questions or need assistance in obtaining follow up care.    __________________________________________  _____________  __________ Signature of Patient or Authorized Representative            Date                   Time    __________________________________________ Nurse's [facility] Discharge Instructions for Patients Receiving Chemotherapy  Today you received the following chemotherapy agents:   To help prevent nausea and vomiting after your treatment, we encourage you  to take your nausea medication.  Take it as often as prescribed.     If you develop nausea and vomiting that is not controlled by your nausea medication, call the clinic. If it is after clinic hours your family physician or the after hours number for the clinic or go to the Emergency Department.   BELOW ARE SYMPTOMS THAT SHOULD BE REPORTED IMMEDIATELY:  *FEVER GREATER THAN 100.5 F  *CHILLS WITH OR WITHOUT FEVER  NAUSEA AND VOMITING THAT IS NOT CONTROLLED WITH YOUR NAUSEA MEDICATION  *UNUSUAL SHORTNESS OF BREATH  *UNUSUAL BRUISING OR BLEEDING  TENDERNESS IN MOUTH AND THROAT WITH OR WITHOUT PRESENCE OF ULCERS  *URINARY PROBLEMS  *BOWEL PROBLEMS  UNUSUAL RASH Items with * indicate a potential emergency and should be followed up as soon as possible.  Feel free to call the clinic you have any questions or concerns. The clinic phone number is [phone].   I have been informed and understand all the instructions given to me. I know to contact the clinic, my physician, or go to the Emergency Department if any problems should occur. I do not have any questions at this time, but understand that I may call the clinic during office hours   should I have any questions or need assistance in obtaining follow up care.    __________________________________________  _____________  __________ Signature of Patient or Authorized Representative            Date  Time    __________________________________________ Nurse's Signature

## 2013-07-20 NOTE — Progress Notes (Unsigned)
07/20/13 BMS [id number], Cycle 4 Kaitlin Reed is in the cancer center today for lab work, physical exam and cycle 4 of nivolumab.

## 2013-07-21 ENCOUNTER — Inpatient Hospital Stay (HOSPITAL_COMMUNITY)
Admission: AD | Admit: 2013-07-21 | Discharge: 2013-07-26 | DRG: 204 | Disposition: A | Discharge status: Home / Self Care | Payer: Medicare Other | Source: Ambulatory Visit | Attending: Cardiovascular Disease | Admitting: Cardiovascular Disease

## 2013-07-21 ENCOUNTER — Inpatient Hospital Stay (HOSPITAL_COMMUNITY): Payer: Medicare Other

## 2013-07-21 ENCOUNTER — Other Ambulatory Visit: Payer: Self-pay | Admitting: Physician Assistant

## 2013-07-21 ENCOUNTER — Encounter (HOSPITAL_COMMUNITY): Payer: Self-pay | Admitting: General Practice

## 2013-07-21 ENCOUNTER — Telehealth: Payer: Self-pay | Admitting: Medical Oncology

## 2013-07-21 DIAGNOSIS — D509 Iron deficiency anemia, unspecified: Secondary | ICD-10-CM | POA: Diagnosis present

## 2013-07-21 DIAGNOSIS — C3490 Malignant neoplasm of unspecified part of unspecified bronchus or lung: Secondary | ICD-10-CM

## 2013-07-21 DIAGNOSIS — J3801 Paralysis of vocal cords and larynx, unilateral: Secondary | ICD-10-CM | POA: Diagnosis present

## 2013-07-21 DIAGNOSIS — E7800 Pure hypercholesterolemia, unspecified: Secondary | ICD-10-CM | POA: Diagnosis present

## 2013-07-21 DIAGNOSIS — J439 Emphysema, unspecified: Secondary | ICD-10-CM

## 2013-07-21 DIAGNOSIS — Z87891 Personal history of nicotine dependence: Secondary | ICD-10-CM

## 2013-07-21 DIAGNOSIS — Z7982 Long term (current) use of aspirin: Secondary | ICD-10-CM

## 2013-07-21 DIAGNOSIS — Z79899 Other long term (current) drug therapy: Secondary | ICD-10-CM

## 2013-07-21 DIAGNOSIS — J988 Other specified respiratory disorders: Secondary | ICD-10-CM

## 2013-07-21 DIAGNOSIS — R042 Hemoptysis: Secondary | ICD-10-CM

## 2013-07-21 DIAGNOSIS — D638 Anemia in other chronic diseases classified elsewhere: Secondary | ICD-10-CM | POA: Diagnosis present

## 2013-07-21 DIAGNOSIS — J398 Other specified diseases of upper respiratory tract: Secondary | ICD-10-CM | POA: Diagnosis present

## 2013-07-21 DIAGNOSIS — I498 Other specified cardiac arrhythmias: Secondary | ICD-10-CM | POA: Diagnosis present

## 2013-07-21 DIAGNOSIS — E860 Dehydration: Secondary | ICD-10-CM | POA: Diagnosis present

## 2013-07-21 DIAGNOSIS — J438 Other emphysema: Secondary | ICD-10-CM | POA: Diagnosis present

## 2013-07-21 DIAGNOSIS — Z9221 Personal history of antineoplastic chemotherapy: Secondary | ICD-10-CM

## 2013-07-21 DIAGNOSIS — D62 Acute posthemorrhagic anemia: Secondary | ICD-10-CM | POA: Diagnosis present

## 2013-07-21 DIAGNOSIS — T40605A Adverse effect of unspecified narcotics, initial encounter: Secondary | ICD-10-CM | POA: Diagnosis present

## 2013-07-21 HISTORY — DX: Personal history of other medical treatment: Z92.89

## 2013-07-21 HISTORY — DX: Anemia, unspecified: D64.9

## 2013-07-21 LAB — CBC WITH DIFFERENTIAL/PLATELET
BASOS ABS: 0 10*3/uL (ref 0.0–0.1)
BASOS PCT: 0 % (ref 0–1)
Eosinophils Absolute: 0.1 10*3/uL (ref 0.0–0.7)
Eosinophils Relative: 2 % (ref 0–5)
HCT: 25.1 % — ABNORMAL LOW (ref 36.0–46.0)
HEMOGLOBIN: 8.3 g/dL — AB (ref 12.0–15.0)
Lymphocytes Relative: 17 % (ref 12–46)
Lymphs Abs: 0.9 10*3/uL (ref 0.7–4.0)
MCH: 28.2 pg (ref 26.0–34.0)
MCHC: 33.1 g/dL (ref 30.0–36.0)
MCV: 85.4 fL (ref 78.0–100.0)
Monocytes Absolute: 0.4 10*3/uL (ref 0.1–1.0)
Monocytes Relative: 8 % (ref 3–12)
NEUTROS ABS: 3.9 10*3/uL (ref 1.7–7.7)
Neutrophils Relative %: 73 % (ref 43–77)
Platelets: 335 10*3/uL (ref 150–400)
RBC: 2.94 MIL/uL — ABNORMAL LOW (ref 3.87–5.11)
RDW: 14 % (ref 11.5–15.5)
WBC: 5.3 10*3/uL (ref 4.0–10.5)

## 2013-07-21 LAB — BASIC METABOLIC PANEL
BUN: 13 mg/dL (ref 6–23)
CHLORIDE: 98 meq/L (ref 96–112)
CO2: 22 mEq/L (ref 19–32)
CREATININE: 0.83 mg/dL (ref 0.50–1.10)
Calcium: 8.1 mg/dL — ABNORMAL LOW (ref 8.4–10.5)
GFR calc non Af Amer: 72 mL/min — ABNORMAL LOW (ref >90)
GFR, EST AFRICAN AMERICAN: 84 mL/min — AB (ref >90)
Glucose, Bld: 96 mg/dL (ref 70–99)
POTASSIUM: 3.9 meq/L (ref 3.7–5.3)
Sodium: 135 mEq/L — ABNORMAL LOW (ref 137–147)

## 2013-07-21 LAB — APTT: APTT: 32 s (ref 24–37)

## 2013-07-21 LAB — PROTIME-INR
INR: 1.12 (ref 0.00–1.49)
Prothrombin Time: 14.2 seconds (ref 11.6–15.2)

## 2013-07-21 MED ORDER — ONDANSETRON HCL 4 MG/2ML IJ SOLN
4.0000 mg | Freq: Four times a day (QID) | INTRAMUSCULAR | Status: DC | PRN
  Administered 2013-07-25: 4 mg via INTRAVENOUS
  Filled 2013-07-21: qty 2

## 2013-07-21 MED ORDER — CALCIUM CARBONATE 600 MG PO TABS
600.0000 mg | ORAL_TABLET | Freq: Two times a day (BID) | ORAL | Status: DC
  Filled 2013-07-21: qty 1

## 2013-07-21 MED ORDER — VITAMIN C 500 MG PO TABS: 1000.0000 mg | ORAL_TABLET | Freq: Every day | ORAL | Status: DC

## 2013-07-21 MED ORDER — ACETAMINOPHEN 160 MG/5ML PO SOLN: 325.0000 mg | Freq: Four times a day (QID) | ORAL | Status: DC | PRN

## 2013-07-21 MED ORDER — ADULT MULTIVITAMIN W/MINERALS CH
1.0000 | ORAL_TABLET | Freq: Every day | ORAL | Status: DC
Start: 2013-07-22 — End: 2013-07-27
  Administered 2013-07-22 – 2013-07-26 (×4): 1 via ORAL
  Filled 2013-07-21 (×5): qty 1

## 2013-07-21 MED ORDER — CALCIUM CARBONATE 1250 (500 CA) MG PO TABS
1.0000 | ORAL_TABLET | Freq: Two times a day (BID) | ORAL | Status: DC
  Administered 2013-07-22 – 2013-07-26 (×9): 500 mg via ORAL
  Filled 2013-07-21 (×11): qty 1

## 2013-07-21 MED ORDER — FERROUS SULFATE 325 (65 FE) MG PO TABS
325.0000 mg | ORAL_TABLET | Freq: Every day | ORAL | Status: DC
  Administered 2013-07-22: 325 mg via ORAL
  Filled 2013-07-21 (×3): qty 1

## 2013-07-21 MED ORDER — VITAMIN B-12 1000 MCG PO TABS
1000.0000 ug | ORAL_TABLET | Freq: Every day | ORAL | Status: DC
  Administered 2013-07-22 – 2013-07-26 (×4): 1000 ug via ORAL
  Filled 2013-07-21 (×5): qty 1

## 2013-07-21 MED ORDER — VITAMIN C 500 MG PO TABS
1000.0000 mg | ORAL_TABLET | Freq: Every day | ORAL | Status: DC
  Administered 2013-07-22 – 2013-07-25 (×3): 1000 mg via ORAL
  Filled 2013-07-21 (×4): qty 2

## 2013-07-21 MED ORDER — POTASSIUM CHLORIDE 20 MEQ/15ML (10%) PO LIQD
6.0000 meq | Freq: Every day | ORAL | Status: DC
  Administered 2013-07-22: 6 meq via ORAL
  Filled 2013-07-21 (×2): qty 7.5

## 2013-07-21 MED ORDER — SODIUM CHLORIDE 0.9 % IJ SOLN
10.0000 mL | INTRAMUSCULAR | Status: DC | PRN
  Administered 2013-07-21 – 2013-07-22 (×2): 10 mL

## 2013-07-21 MED ORDER — ALBUTEROL SULFATE (2.5 MG/3ML) 0.083% IN NEBU
2.5000 mg | INHALATION_SOLUTION | Freq: Four times a day (QID) | RESPIRATORY_TRACT | Status: AC
  Administered 2013-07-21 – 2013-07-22 (×4): 2.5 mg via RESPIRATORY_TRACT
  Filled 2013-07-21 (×4): qty 3

## 2013-07-21 MED ORDER — ALUM & MAG HYDROXIDE-SIMETH 200-200-20 MG/5ML PO SUSP
30.0000 mL | Freq: Four times a day (QID) | ORAL | Status: DC | PRN
Start: 2013-07-21 — End: 2013-07-27

## 2013-07-21 MED ORDER — ONDANSETRON HCL 4 MG PO TABS: 4.0000 mg | ORAL_TABLET | Freq: Four times a day (QID) | ORAL | Status: DC | PRN

## 2013-07-21 MED ORDER — SODIUM CHLORIDE 0.9 % IV SOLN
INTRAVENOUS | Status: DC
  Administered 2013-07-21 – 2013-07-22 (×3): via INTRAVENOUS

## 2013-07-21 MED ORDER — TEMAZEPAM 15 MG PO CAPS: 15.0000 mg | ORAL_CAPSULE | Freq: Every evening | ORAL | Status: DC | PRN

## 2013-07-21 MED ORDER — VITAMIN D3 25 MCG (1000 UNIT) PO TABS
2000.0000 [IU] | ORAL_TABLET | Freq: Every day | ORAL | Status: DC
  Administered 2013-07-22 – 2013-07-26 (×4): 2000 [IU] via ORAL
  Filled 2013-07-21 (×5): qty 2

## 2013-07-21 MED ORDER — ACETAMINOPHEN 160 MG/5ML PO SOLN
500.0000 mg | Freq: Four times a day (QID) | ORAL | Status: DC | PRN
  Administered 2013-07-22 (×2): 500 mg via ORAL
  Filled 2013-07-21 (×4): qty 20

## 2013-07-21 MED ORDER — ACETAMINOPHEN 325 MG PO TABS: 650.0000 mg | ORAL_TABLET | Freq: Four times a day (QID) | ORAL | Status: DC | PRN

## 2013-07-21 MED ORDER — ACETAMINOPHEN 650 MG RE SUPP: 650.0000 mg | Freq: Four times a day (QID) | RECTAL | Status: DC | PRN

## 2013-07-21 MED ORDER — SODIUM CHLORIDE 0.9 % IJ SOLN
3.0000 mL | Freq: Two times a day (BID) | INTRAMUSCULAR | Status: DC
  Administered 2013-07-24 – 2013-07-26 (×5): 3 mL via INTRAVENOUS

## 2013-07-21 MED ORDER — DOCUSATE SODIUM 100 MG PO CAPS
100.0000 mg | ORAL_CAPSULE | Freq: Two times a day (BID) | ORAL | Status: DC
  Administered 2013-07-21 – 2013-07-26 (×8): 100 mg via ORAL
  Filled 2013-07-21 (×11): qty 1

## 2013-07-21 MED ORDER — VITAMIN D 50 MCG (2000 UT) PO TABS: 2000.0000 [IU] | ORAL_TABLET | Freq: Every day | ORAL | Status: DC

## 2013-07-21 MED ORDER — METOPROLOL TARTRATE 25 MG PO TABS
25.0000 mg | ORAL_TABLET | Freq: Two times a day (BID) | ORAL | Status: DC
  Administered 2013-07-21 – 2013-07-23 (×4): 25 mg via ORAL
  Filled 2013-07-21 (×7): qty 1

## 2013-07-21 MED ORDER — POTASSIUM CHLORIDE CRYS ER 10 MEQ PO TBCR: 5.0000 meq | EXTENDED_RELEASE_TABLET | Freq: Every day | ORAL | Status: DC

## 2013-07-21 NOTE — H&P (Signed)
Kaitlin Reed is an 63 y.o. female.   Chief Complaint: Coughing up large amount of blood x 2.  HPI: 63 year old female with known history of non-small cell lung cancer since 07/2009 had left lower lobectomy with lymph node dissection on 10/17/2009. She had 2 episodes of large hemoptysis. She had been treated with Carboplatin and Alimta in 2011. With recurrence of cancer she had 2 cycles of Nivolumab immunotherapy and Zometa 4 mg IV every month since 05/17/2013.  She reports 2 episodes of large hemoptysis in last 24 hours. No fever. Increasing cough and some chest discomfort with cough.  Past Medical History  Diagnosis Date  . Heart murmur   . Tubal pregnancy   . lung ca dx'd 06/2009    chemo comp 07/2010; xrt comp 06/2010  . Hypercholesteremia     h/o      Past Surgical History  Procedure Laterality Date  . Lung removal, partial  10/17/09    left. pt states they removed small piece of lung due to cancer  . 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 Hypertension Father   . Cancer Sister     breast   Social History:  reports that she quit smoking about 31 years ago. Her smoking use included Cigarettes. She has a 13 pack-year smoking history. She has never used smokeless tobacco. She reports that she does not drink alcohol or use illicit drugs.  Allergies:  Allergies  Allergen Reactions  . Codeine Nausea And Vomiting  . Motrin [Ibuprofen] Nausea And Vomiting  . Other Nausea Only    steroids  . Trazodone And Nefazodone Nausea And Vomiting  . Banana Hives  . Tape Swelling    Paper tape okay Cannot use elastic tape  . Delton See [Denosumab] Rash    Medications Prior to Admission  Medication Sig Dispense Refill  . Ascorbic Acid (VITAMIN C) 1000 MG tablet Take 1,000 mg by mouth daily.      Marland Kitchen aspirin EC 81 MG tablet Take 81 mg by mouth daily.      . Bromfenac Sodium (PROLENSA) 0.07 % SOLN Place 1 drop into the left eye daily.        . calcium carbonate (OS-CAL) 600 MG TABS Take 600 mg by mouth 2 (two) times daily with a meal.      . Cholecalciferol (VITAMIN D) 2000 UNITS tablet Take 2,000 Units by mouth daily.      . ferrous sulfate 325 (65 FE) MG tablet Take 325 mg by mouth daily with breakfast.      . metoprolol tartrate (LOPRESSOR) 25 MG tablet Take 25 mg by mouth 2 (two) times daily.      . Multiple Vitamin (MULITIVITAMIN WITH MINERALS) TABS Take 1 tablet by mouth daily.       . potassium chloride (K-DUR,KLOR-CON) 10 MEQ tablet Take 5 mEq by mouth daily.      . Selenium 200 MCG CAPS Take 1 capsule by mouth daily.      . temazepam (RESTORIL) 15 MG capsule Take 1 capsule (15 mg total) by mouth at bedtime as needed for sleep.  30 capsule  0  . vitamin B-12 (CYANOCOBALAMIN) 1000 MCG tablet Take 1,000 mcg by mouth daily.      . vitamin E 400 UNIT capsule Take 400 Units by mouth daily.      Marland Kitchen zolendronic acid (ZOMETA) 4 MG/5ML injection Inject 4  mg into the vein every 28 (twenty-eight) days.         Results for orders placed in visit on 07/20/13 (from the past 48 hour(s))  CBC WITH DIFFERENTIAL     Status: Abnormal   Collection Time    07/20/13  1:54 PM      Result Value Ref Range   WBC 7.2  3.9 - 10.3 10e3/uL   NEUT# 5.2  1.5 - 6.5 10e3/uL   HGB 10.0 (*) 11.6 - 15.9 g/dL   HCT 30.4 (*) 34.8 - 46.6 %   Platelets 432 (*) 145 - 400 10e3/uL   MCV 86.9  79.5 - 101.0 fL   MCH 28.5  25.1 - 34.0 pg   MCHC 32.8  31.5 - 36.0 g/dL   RBC 3.50 (*) 3.70 - 5.45 10e6/uL   RDW 14.9 (*) 11.2 - 14.5 %   lymph# 1.2  0.9 - 3.3 10e3/uL   MONO# 0.7  0.1 - 0.9 10e3/uL   Eosinophils Absolute 0.1  0.0 - 0.5 10e3/uL   Basophils Absolute 0.1  0.0 - 0.1 10e3/uL   NEUT% 72.2  38.4 - 76.8 %   LYMPH% 16.1  14.0 - 49.7 %   MONO% 9.2  0.0 - 14.0 %   EOS% 1.4  0.0 - 7.0 %   BASO% 1.1  0.0 - 2.0 %  TSH [facility]     Status: Abnormal   Collection Time    07/20/13  1:54 PM      Result Value Ref Range   TSH 4.730 (*) 0.308 - 3.960 m(IU)/L   PHOSPHORUS     Status: None   Collection Time    07/20/13  1:54 PM      Result Value Ref Range   Phosphorus 4.2  2.3 - 4.6 mg/dL  LACTATE DEHYDROGENASE (CC13)     Status: Abnormal   Collection Time    07/20/13  1:54 PM      Result Value Ref Range   LDH 401 (*) 125 - 245 U/L  MAGNESIUM (CC13)     Status: None   Collection Time    07/20/13  1:54 PM      Result Value Ref Range   Magnesium 2.2  1.5 - 2.5 mg/dl  COMPREHENSIVE METABOLIC PANEL (ZG01)     Status: Abnormal   Collection Time    07/20/13  1:54 PM      Result Value Ref Range   Sodium 138  136 - 145 mEq/L   Potassium 3.8  3.5 - 5.1 mEq/L   Chloride 102  98 - 109 mEq/L   CO2 24  22 - 29 mEq/L   Glucose 95  70 - 140 mg/dl   BUN 20.7  7.0 - 26.0 mg/dL   Creatinine 1.3 (*) 0.6 - 1.1 mg/dL   Total Bilirubin 0.21  0.20 - 1.20 mg/dL   Alkaline Phosphatase 269 (*) 40 - 150 U/L   AST 20  5 - 34 U/L   ALT <6  0 - 55 U/L   Total Protein 8.4 (*) 6.4 - 8.3 g/dL   Albumin 3.7  3.5 - 5.0 g/dL   Calcium 10.1  8.4 - 10.4 mg/dL   Anion Gap 11  3 - 11 mEq/L  T3, FREE     Status: None   Collection Time    07/20/13  3:33 PM      Result Value Ref Range   T3, Free 2.6  2.3 - 4.2 pg/mL  T4, FREE  Status: None   Collection Time    07/20/13  3:33 PM      Result Value Ref Range   Free T4 1.02  0.80 - 1.80 ng/dL   No results found.  ROS Constitutional: positive for fatigue and weight loss  Eyes: negative  Ears, nose, mouth, throat, and face: negative  Respiratory: positive for cough and hemoptysis  Cardiovascular: negative  Gastrointestinal: positive for epigastric discomfort  Genitourinary: negative  Integument/breast: negative  Hematologic/lymphatic: negative  Musculoskeletal:positive for arthralgias and myalgias  Neurological: negative  Behavioral/Psych: negative  Endocrine: negative  Allergic/Immunologic: negative   Blood pressure 106/42, pulse 113, temperature 98.1 F (36.7 C), temperature source Oral, resp. rate 20,  height 5\' 6"  (1.676 m), weight 60.6 kg (133 lb 9.6 oz), SpO2 96.00%.  PHYSICAL EXAMINATION: General appearance: alert, cooperative but appears anxious.  Head: Normocephalic, atraumatic. [employer], PERL, EOMI. Multiple dark brown freckles over face. Neck: no adenopathy, full range of motion.  Lymph nodes: Cervical, supraclavicular, and axillary nodes normal.  Resp: clear to auscultation bilaterally. Right anterior chest Port-A-Cath, well-healed, no evidence of infection or inflammation, non-tender. Cardio: Tachycardic, S1, S2 normal, no murmur, click or rub.  GI: soft, epigastric and left upper quadrant-tender; bowel sounds normal; no masses, no organomegaly.  Extremities: extremities normal, atraumatic, no cyanosis or edema  Neurologic: Alert and oriented X 3, normal strength and tone. Normal symmetric reflexes. Normal coordination and gait  Skin-mild paleness.  Assessment/Plan Acute hemoptysis Sinus tachycardia Possible dehydration Anemia of chronic disease Non-small cell lung cancer Left hip pain Abdominal pain  Admit/IV fluids/Pulmonary consult/Hold aspirin/ Follow Hgb.  Lochlann Mastrangelo S 07/21/2013, 6:07 PM

## 2013-07-21 NOTE — Telephone Encounter (Signed)
MOM Late entry: Patient called today to report "another bleeding episode."  Return call to pt, states she had 2 bleeding episodes today, but has stopped. States she had a cardiologist appt with Dr. Doylene Canard @ 2:30 and will let him know as well.   Reviewed with Dr Earlie Server and Awilda Metro PA, patient being referred to pulmonologist. [facility] with patient to expect call from scheduling regarding that appt. Patient to call office with any questions or concerns.

## 2013-07-21 NOTE — Progress Notes (Signed)
Pt direct admit to room 3E24 via wheelchair. Pt alert and oriented x 4. Assessment completed. Pt c/o pain in L hip which she stated MD is aware of. Pt oriented to room and unit. Call bell in reach.   Eulis Canner, RN

## 2013-07-22 ENCOUNTER — Telehealth: Payer: Self-pay | Admitting: Internal Medicine

## 2013-07-22 DIAGNOSIS — R042 Hemoptysis: Secondary | ICD-10-CM

## 2013-07-22 DIAGNOSIS — J439 Emphysema, unspecified: Secondary | ICD-10-CM

## 2013-07-22 DIAGNOSIS — C3490 Malignant neoplasm of unspecified part of unspecified bronchus or lung: Secondary | ICD-10-CM

## 2013-07-22 DIAGNOSIS — J438 Other emphysema: Secondary | ICD-10-CM

## 2013-07-22 LAB — IRON AND TIBC
Iron: 15 ug/dL — ABNORMAL LOW (ref 42–135)
Saturation Ratios: 8 % — ABNORMAL LOW (ref 20–55)
TIBC: 197 ug/dL — AB (ref 250–470)
UIBC: 182 ug/dL (ref 125–400)

## 2013-07-22 LAB — CBC
HCT: 24.4 % — ABNORMAL LOW (ref 36.0–46.0)
HEMOGLOBIN: 8.1 g/dL — AB (ref 12.0–15.0)
MCH: 28.5 pg (ref 26.0–34.0)
MCHC: 33.2 g/dL (ref 30.0–36.0)
MCV: 85.9 fL (ref 78.0–100.0)
Platelets: 318 10*3/uL (ref 150–400)
RBC: 2.84 MIL/uL — AB (ref 3.87–5.11)
RDW: 14.1 % (ref 11.5–15.5)
WBC: 4.3 10*3/uL (ref 4.0–10.5)

## 2013-07-22 LAB — FERRITIN: Ferritin: 663 ng/mL — ABNORMAL HIGH (ref 10–291)

## 2013-07-22 LAB — BASIC METABOLIC PANEL
BUN: 11 mg/dL (ref 6–23)
CO2: 22 mEq/L (ref 19–32)
Calcium: 8.1 mg/dL — ABNORMAL LOW (ref 8.4–10.5)
Chloride: 104 mEq/L (ref 96–112)
Creatinine, Ser: 0.76 mg/dL (ref 0.50–1.10)
GFR calc non Af Amer: 87 mL/min — ABNORMAL LOW (ref >90)
Glucose, Bld: 93 mg/dL (ref 70–99)
POTASSIUM: 3.7 meq/L (ref 3.7–5.3)
Sodium: 140 mEq/L (ref 137–147)

## 2013-07-22 MED ORDER — SODIUM CHLORIDE 0.9 % IV SOLN
INTRAVENOUS | Status: DC
  Administered 2013-07-22 – 2013-07-23 (×2): via INTRAVENOUS

## 2013-07-22 MED ORDER — TEMAZEPAM 15 MG PO CAPS
15.0000 mg | ORAL_CAPSULE | Freq: Every evening | ORAL | Status: DC | PRN
  Administered 2013-07-22 – 2013-07-24 (×2): 15 mg via ORAL
  Filled 2013-07-22 (×3): qty 1

## 2013-07-22 MED ORDER — HYDROCOD POLST-CHLORPHEN POLST 10-8 MG/5ML PO LQCR: 5.0000 mL | Freq: Two times a day (BID) | ORAL | Status: DC | PRN

## 2013-07-22 NOTE — Progress Notes (Signed)
Utilization Review Completed Deondre Marinaro J. Kanon Colunga, RN, BSN, NCM [phone]  

## 2013-07-22 NOTE — Progress Notes (Signed)
  Echocardiogram 2D Echocardiogram has been performed.  Kaitlin Reed 07/22/2013, 5:15 PM

## 2013-07-22 NOTE — Progress Notes (Signed)
Subjective:  Small furhter drop in Hgb. Some weakness and chest pain with cough. T max 99.9 F Pulmonary consulted. Heart rate down to 85.  Objective:  Vital Signs in the last 24 hours: Temp:  [98 F (36.7 C)-99.9 F (37.7 C)] 98.5 F (36.9 C) (03/26 0643) Pulse Rate:  [85-113] 85 (03/26 0643) Cardiac Rhythm:  [-] Normal sinus rhythm;Sinus tachycardia (03/25 2015) Resp:  [18-20] 18 (03/26 0643) BP: (87-106)/(42-77) 95/48 mmHg (03/26 0643) SpO2:  [95 %-100 %] 95 % (03/26 0643) Weight:  [60.328 kg (133 lb)-60.6 kg (133 lb 9.6 oz)] 60.328 kg (133 lb) (03/26 3016)  Physical Exam: BP Readings from Last 1 Encounters:  07/22/13 95/48     Wt Readings from Last 1 Encounters:  07/22/13 60.328 kg (133 lb)    Weight change:   HEENT: [place]/AT, Eyes-Brown, PERL, EOMI, Conjunctiva-Pale, Sclera-Non-icteric Neck: No JVD, No bruit, Trachea midline. Lungs:  Rare wheezing, Bilateral. Cardiac:  Regular rhythm, normal S1 and S2, no S3.  Abdomen:  Soft, non-tender. Extremities:  No edema present. No cyanosis. No clubbing. CNS: AxOx3, Cranial nerves grossly intact, moves all 4 extremities. Right handed. Skin: Warm and dry.   Intake/Output from previous day: 03/25 0701 - 03/26 0700 In: 486.7 [I.V.:486.7] Out: 850 [Urine:850]    Lab Results: BMET    Component Value Date/Time   NA 140 07/22/2013 0515   NA 138 07/20/2013 1354   NA 138 11/06/2011 1121   K 3.7 07/22/2013 0515   K 3.8 07/20/2013 1354   K 4.3 11/06/2011 1121   CL 104 07/22/2013 0515   CL 105 10/07/2012 1350   CL 98 11/06/2011 1121   CO2 22 07/22/2013 0515   CO2 24 07/20/2013 1354   CO2 30 11/06/2011 1121   GLUCOSE 93 07/22/2013 0515   GLUCOSE 95 07/20/2013 1354   GLUCOSE 123* 10/07/2012 1350   GLUCOSE 108 11/06/2011 1121   BUN 11 07/22/2013 0515   BUN 20.7 07/20/2013 1354   BUN 15 11/06/2011 1121   CREATININE 0.76 07/22/2013 0515   CREATININE 1.3* 07/20/2013 1354   CREATININE 0.9 11/06/2011 1121   CALCIUM 8.1* 07/22/2013 0515   CALCIUM 10.1  07/20/2013 1354   CALCIUM 9.0 11/06/2011 1121   GFRNONAA 87* 07/22/2013 0515   GFRAA >90 07/22/2013 0515   CBC    Component Value Date/Time   WBC 4.3 07/22/2013 0515   WBC 7.2 07/20/2013 1354   RBC 2.84* 07/22/2013 0515   RBC 3.50* 07/20/2013 1354   HGB 8.1* 07/22/2013 0515   HGB 10.0* 07/20/2013 1354   HCT 24.4* 07/22/2013 0515   HCT 30.4* 07/20/2013 1354   PLT 318 07/22/2013 0515   PLT 432* 07/20/2013 1354   MCV 85.9 07/22/2013 0515   MCV 86.9 07/20/2013 1354   MCH 28.5 07/22/2013 0515   MCH 28.5 07/20/2013 1354   MCHC 33.2 07/22/2013 0515   MCHC 32.8 07/20/2013 1354   RDW 14.1 07/22/2013 0515   RDW 14.9* 07/20/2013 1354   LYMPHSABS 0.9 07/21/2013 2210   LYMPHSABS 1.2 07/20/2013 1354   MONOABS 0.4 07/21/2013 2210   MONOABS 0.7 07/20/2013 1354   EOSABS 0.1 07/21/2013 2210   EOSABS 0.1 07/20/2013 1354   BASOSABS 0.0 07/21/2013 2210   BASOSABS 0.1 07/20/2013 1354   CARDIAC ENZYMES Lab Results  Component Value Date   CKTOTAL 164 04/17/2011   CKMB 2.3 04/17/2011   TROPONINI <0.30 01/21/2012    Scheduled Meds: . albuterol  2.5 mg Nebulization Q6H  . calcium carbonate  1 tablet  Oral BID WC  . cholecalciferol  2,000 Units Oral Daily  . docusate sodium  100 mg Oral BID  . ferrous sulfate  325 mg Oral Q breakfast  . metoprolol tartrate  25 mg Oral BID  . multivitamin with minerals  1 tablet Oral Daily  . potassium chloride  6 mEq Oral Daily  . sodium chloride  3 mL Intravenous Q12H  . vitamin B-12  1,000 mcg Oral Daily  . vitamin C  1,000 mg Oral Daily   Continuous Infusions: . sodium chloride 100 mL/hr at 07/22/13 0536   PRN Meds:.acetaminophen (TYLENOL) oral liquid 160 mg/5 mL, alum & mag hydroxide-simeth, chlorpheniramine-HYDROcodone, ondansetron (ZOFRAN) IV, ondansetron, sodium chloride  Assessment/Plan: Acute hemoptysis  Sinus tachycardia -resolved Possible dehydration -improving  Anemia of chronic disease and blood loss Non-small cell lung cancer  Left hip pain  Abdominal  pain  Awaiting FOB. Echocardiogram and anemia work-up.   LOS: 1 day    Dixie Dials  MD  07/22/2013, 8:25 AM

## 2013-07-22 NOTE — Telephone Encounter (Signed)
S.w.l pt and advised on 4.15 appt with Dr. Lamonte Sakai @ 10:15am...the patient currently in hospital and a Dr. will be putting light down throat.  She may not keep appt.

## 2013-07-22 NOTE — Consult Note (Signed)
PULMONARY / CRITICAL CARE MEDICINE  Name: Kaitlin Reed MRN: [id number] DOB: July 06, 1950    ADMISSION DATE:  07/21/2013 CONSULTATION DATE:  07/22/2013  REFERRING MD :  Dr. Doylene Canard  PRIMARY SERVICE:  Dr. 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  Noted increase over week prior to admission.  Patient admitted for evaluation.   SIGNIFICANT EVENTS / STUDIES:   CULTURES:  ANTIBIOTICS:  HISTORY OF PRESENT ILLNESS:  63 y/o F with PMH of Anemia, recurrent non-small cell CA s/p left lower lobectomy on nivolumab who has had ongoing intermittent hemoptysis since 05/2013.  Patient reports Kaitlin Reed was previously on ASA but has stopped since Feb when Kaitlin Reed initially noted bleeding.  Kaitlin Reed reports it started with cough and small amount of bleeding.  Dr. Julien Nordmann has been aware of and monitoring.  Kaitlin Reed noted an increase over the past week prior to presentation.  Kaitlin Reed reports coughing with hemoptysis that has filled a 1/2 cup.  When the episodes occur, Kaitlin Reed coughs until Kaitlin Reed vomits up blood as well.  Kaitlin Reed called her PCP with above findings and was admitted for further evaluation.  Kaitlin Reed denies NSAID, ASA or blood thinner usage.  Denies chest pain, pain with inspiration, n/v/d, headaches, syncope or weight loss.   PAST MEDICAL HISTORY :  Past Medical History  Diagnosis Date  . Heart murmur   . 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 Anemia   . History of blood transfusion 1970's    "related to tubal pregnancy"   . lung ca dx'd 06/2009    chemo comp 07/2010; xrt comp 06/2010   Past Surgical History  Procedure Laterality Date  . Lung removal, partial Left 10/17/09    pt states they removed small piece of lung due to cancer  . 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 Insertion central venous access device w/ subcutaneous port Right 05/2013    "PowerPort"   Prior to Admission medications    Medication Sig Start Date End Date Taking? Authorizing Provider  Ascorbic Acid (VITAMIN C) 1000 MG tablet Take 1,000 mg by mouth daily.   Yes Historical Provider, MD  aspirin EC 81 MG tablet Take 81 mg by mouth daily.   Yes Historical Provider, MD  Bromfenac Sodium (PROLENSA) 0.07 % SOLN Place 1 drop into the left eye daily.    Yes Historical Provider, MD  calcium carbonate (OS-CAL) 600 MG TABS Take 600 mg by mouth 2 (two) times daily with a meal.   Yes Historical Provider, MD  Cholecalciferol (VITAMIN D) 2000 UNITS tablet Take 2,000 Units by mouth daily.   Yes Historical Provider, MD  ferrous sulfate 325 (65 FE) MG tablet Take 325 mg by mouth daily with breakfast.   Yes Historical Provider, MD  metoprolol tartrate (LOPRESSOR) 25 MG tablet Take 25 mg by mouth 2 (two) times daily.   Yes Historical Provider, MD  Multiple Vitamin (MULITIVITAMIN WITH MINERALS) TABS Take 1 tablet by mouth daily.    Yes Historical Provider, MD  NIVOLUMAB IV Inject 3 mg/kg into the vein every 14 (fourteen) days. *iv infusion that runs for 38min*   Yes Historical Provider, MD  potassium chloride (K-DUR,KLOR-CON) 10 MEQ tablet Take 5 mEq by mouth daily.   Yes Historical Provider, MD  Selenium 200 MCG CAPS Take 200 mcg by mouth daily.    Yes Historical Provider, MD  temazepam (RESTORIL)  15 MG capsule Take 15 mg by mouth at bedtime as needed for sleep.   Yes Historical Provider, MD  vitamin B-12 (CYANOCOBALAMIN) 1000 MCG tablet Take 1,000 mcg by mouth daily.   Yes Historical Provider, MD  vitamin E 400 UNIT capsule Take 400 Units by mouth daily.   Yes Historical Provider, MD  zolendronic acid (ZOMETA) 4 MG/5ML injection Inject 4 mg into the vein every 28 (twenty-eight) days.    Yes Historical Provider, MD   Allergies  Allergen Reactions  . Codeine Nausea And Vomiting  . Motrin [Ibuprofen] Nausea And Vomiting  . Other Nausea Only    steroids  . Trazodone And Nefazodone Nausea And Vomiting  . Banana Hives  . Tape  Swelling    Paper tape okay Cannot use elastic tape  . Delton See [Denosumab] Rash   FAMILY HISTORY:  Family History  Problem Relation Age of Onset  . Hypertension Father   . Cancer Sister     breast   SOCIAL HISTORY:  reports that Kaitlin Reed has quit smoking. Her smoking use included Cigarettes. Kaitlin Reed has a 6 pack-year smoking history. Kaitlin Reed has never used smokeless tobacco. Kaitlin Reed reports that Kaitlin Reed does not drink alcohol or use illicit drugs.  REVIEW OF SYSTEMS:   Constitutional: Negative for fever, chills, weight loss, malaise/fatigue and diaphoresis.  HENT: Negative for hearing loss, ear pain, nosebleeds, congestion, sore throat, neck pain, tinnitus and ear discharge.   Eyes: Negative for blurred vision, double vision, photophobia, pain, discharge and redness.  Respiratory: Negative for sputum production, shortness of breath, wheezing and stridor.  Positive for cough, intermittent episodes of hemoptysis since Feb 2015 - see HPI Cardiovascular: Negative for chest pain, palpitations, orthopnea, claudication, leg swelling and PND.  Gastrointestinal: Negative for heartburn, nausea, abdominal pain, diarrhea, constipation, blood in stool and melena. Reports dark stools & bloody emesis with coughing / hemoptysis episodes  Genitourinary: Negative for dysuria, urgency, frequency, hematuria and flank pain.  Musculoskeletal: Negative for myalgias, back pain, joint pain and falls.  Skin: Negative for itching and rash.  Neurological: Negative for dizziness, tingling, tremors, sensory change, speech change, focal weakness, seizures, loss of consciousness, weakness and headaches.  Endo/Heme/Allergies: Negative for environmental allergies and polydipsia. Does not bruise/bleed easily.  SUBJECTIVE:  VITAL SIGNS: Temp:  [98 F (36.7 C)-98.1 F (36.7 C)] 98 F (36.7 C) (03/25 2101) Pulse Rate:  [104-113] 108 (03/25 2255) Resp:  [18-20] 18 (03/25 2212) BP: (94-106)/(42-77) 99/77 mmHg (03/25 2255) SpO2:  [96 %-98 %]  98 % (03/25 2212) Weight:  [133 lb 9.6 oz (60.6 kg)] 133 lb 9.6 oz (60.6 kg) (03/25 1708)  PHYSICAL EXAMINATION: General:  wdwn adult female in NAD Neuro:  AAOx4, speech clear, MAE HEENT:  Mm pink/moist, no jvd Cardiovascular:  s1s2 rrr, no m/r/g Lungs:  resp's even/non-labored on RA, R lung clear, diminished LL, otherwise clear Abdomen:  Round/soft, bsx4 active Musculoskeletal:  No acute deformities Skin:  Warm/dry, no edema   LABS: CBC  Recent Labs Lab 07/20/13 1354 07/21/13 2210 07/22/13 0515  WBC 7.2 5.3 4.3  HGB 10.0* 8.3* 8.1*  HCT 30.4* 25.1* 24.4*  PLT 432* 335 318   Coag's  Recent Labs Lab 07/21/13 2210  APTT 32  INR 1.12   BMET  Recent Labs Lab 07/20/13 1354 07/21/13 2210 07/22/13 0515  NA 138 135* 140  K 3.8 3.9 3.7  CL  --  98 104  CO2 24 22 22   BUN 20.7 13 11   CREATININE 1.3* 0.83 0.76  GLUCOSE 95  96 93   Electrolytes  Recent Labs Lab 07/20/13 1354 07/20/13 1354 07/21/13 2210 07/22/13 0515  CALCIUM  --  10.1 8.1* 8.1*  MG 2.2  --   --   --   PHOS 4.2  --   --   --    Sepsis Markers No results found for this basename: LATICACIDVEN, PROCALCITON, O2SATVEN,  in the last 168 hours ABG No results found for this basename: PHART, PCO2ART, PO2ART,  in the last 168 hours Liver Enzymes  Recent Labs Lab 07/20/13 1354  AST 20  ALT <6  ALKPHOS 269*  BILITOT 0.21  ALBUMIN 3.7   Cardiac Enzymes No results found for this basename: TROPONINI, PROBNP,  in the last 168 hours Glucose No results found for this basename: GLUCAP,  in the last 168 hours  IMAGING: X-ray Chest Pa And Lateral   07/22/2013   CLINICAL DATA:  Hemoptysis  EXAM: CHEST  2 VIEW  COMPARISON:  07/13/2013 chest CT  FINDINGS: Right IJ porta catheter, tip at the superior cavoatrial junction. Normal heart size. No evidence of increasing pericardial effusion. 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  No infiltrate or  edema. No effusion or pneumothorax. There is chronic blunting of the lateral left costophrenic sulcus related to scarring. Bandlike opacity in the posterior left lung appears unchanged. Osseous metastatic disease noted.  IMPRESSION: 1. No active cardiopulmonary disease. 2. Intrathoracic and osseous malignancy.   Electronically Signed   By: Jorje Guild M.D.   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  Doree Fudge, MD [facility] [place] Pager: [phone]  07/22/2013, 11:52 AM

## 2013-07-22 NOTE — Care Management Note (Signed)
    Page 1 of 1   07/22/2013     2:17:57 PM   CARE MANAGEMENT NOTE 07/22/2013  Patient:  Kaitlin Reed, Kaitlin Reed   Account Number:  [id number]  Date Initiated:  07/22/2013  Documentation initiated by:  Fuller Mandril  Subjective/Objective Assessment:   63 year old female with known history of non-small cell lung cancer since 07/2009 had left lower lobectomy with lymph node dissection on 10/17/2009. She had 2 episodes of large hemoptysis//Home with spouse     Action/Plan:   Admit/IV fluids/Pulmonary consult/Hold aspirin/ Follow Hgb.//Access for [facility] services   Anticipated DC Date:  07/26/2013   Anticipated DC Plan:  [place]  CM consult      Choice offered to / List presented to:             Status of service:  In process, will continue to follow Medicare Important Message given?   (If response is "NO", the following Medicare IM given date fields will be blank) Date Medicare IM given:   Date Additional Medicare IM given:    Discharge Disposition:    Per UR Regulation:    If discussed at Long Length of Stay Meetings, dates discussed:    Comments:

## 2013-07-23 ENCOUNTER — Telehealth: Payer: Self-pay

## 2013-07-23 ENCOUNTER — Inpatient Hospital Stay (HOSPITAL_COMMUNITY): Payer: Medicare Other

## 2013-07-23 ENCOUNTER — Encounter (HOSPITAL_COMMUNITY)
Admission: AD | Disposition: A | Discharge status: Home / Self Care | Payer: Medicare Other | Source: Ambulatory Visit | Attending: Cardiovascular Disease

## 2013-07-23 HISTORY — PX: VIDEO BRONCHOSCOPY: SHX5072

## 2013-07-23 LAB — BASIC METABOLIC PANEL
BUN: 7 mg/dL (ref 6–23)
CALCIUM: 8.7 mg/dL (ref 8.4–10.5)
CO2: 23 meq/L (ref 19–32)
CREATININE: 0.73 mg/dL (ref 0.50–1.10)
Chloride: 102 mEq/L (ref 96–112)
GFR calc Af Amer: >90 mL/min (ref >90)
GFR, EST NON AFRICAN AMERICAN: 88 mL/min — AB (ref >90)
GLUCOSE: 89 mg/dL (ref 70–99)
Potassium: 4.4 mEq/L (ref 3.7–5.3)
Sodium: 139 mEq/L (ref 137–147)

## 2013-07-23 LAB — CBC
HCT: 25.7 % — ABNORMAL LOW (ref 36.0–46.0)
Hemoglobin: 8.5 g/dL — ABNORMAL LOW (ref 12.0–15.0)
MCH: 28.3 pg (ref 26.0–34.0)
MCHC: 33.1 g/dL (ref 30.0–36.0)
MCV: 85.7 fL (ref 78.0–100.0)
PLATELETS: 323 10*3/uL (ref 150–400)
RBC: 3 MIL/uL — ABNORMAL LOW (ref 3.87–5.11)
RDW: 14.2 % (ref 11.5–15.5)
WBC: 6.6 10*3/uL (ref 4.0–10.5)

## 2013-07-23 SURGERY — VIDEO BRONCHOSCOPY WITHOUT FLUORO
Anesthesia: Moderate Sedation | Laterality: Bilateral

## 2013-07-23 MED ORDER — LIDOCAINE HCL 1 % IJ SOLN
INTRAMUSCULAR | Status: DC | PRN
  Administered 2013-07-23: 6 mL via RESPIRATORY_TRACT

## 2013-07-23 MED ORDER — FERROUS SULFATE 325 (65 FE) MG PO TABS
325.0000 mg | ORAL_TABLET | Freq: Two times a day (BID) | ORAL | Status: DC
  Administered 2013-07-24 – 2013-07-26 (×6): 325 mg via ORAL
  Filled 2013-07-23 (×7): qty 1

## 2013-07-23 MED ORDER — LIDOCAINE HCL 2 % EX GEL
CUTANEOUS | Status: DC | PRN
  Administered 2013-07-23: 1

## 2013-07-23 MED ORDER — MIDAZOLAM HCL 10 MG/2ML IJ SOLN
INTRAMUSCULAR | Status: DC | PRN
  Administered 2013-07-23 (×2): 2 mg via INTRAVENOUS

## 2013-07-23 MED ORDER — SODIUM CHLORIDE 0.9 % IV SOLN
Freq: Once | INTRAVENOUS | Status: AC
  Administered 2013-07-23: 11:00:00 via INTRAVENOUS

## 2013-07-23 MED ORDER — PHENYLEPHRINE HCL 0.25 % NA SOLN
NASAL | Status: DC | PRN
  Administered 2013-07-23: 1 via NASAL

## 2013-07-23 MED ORDER — FENTANYL CITRATE 0.05 MG/ML IJ SOLN
INTRAMUSCULAR | Status: DC | PRN
  Administered 2013-07-23 (×2): 50 ug via INTRAVENOUS

## 2013-07-23 MED ORDER — POTASSIUM CHLORIDE CRYS ER 20 MEQ PO TBCR
20.0000 meq | EXTENDED_RELEASE_TABLET | Freq: Two times a day (BID) | ORAL | Status: DC
  Administered 2013-07-23 – 2013-07-26 (×7): 20 meq via ORAL
  Filled 2013-07-23 (×10): qty 1

## 2013-07-23 NOTE — Progress Notes (Signed)
Dr. 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  Pt stated understanding.

## 2013-07-23 NOTE — Progress Notes (Signed)
Returned from Endoscopy s/p EGD.  Alert and oriented x 3 and no voiced complaints.  Skin warm and dry.  NPO status per orders

## 2013-07-23 NOTE — Plan of Care (Signed)
Problem: Phase III Progression Outcomes Goal: IV/normal saline lock discontinued Outcome: Not Applicable Date Met:  57/01/77 Has port-a-cath Goal: Foley discontinued Outcome: Not Applicable Date Met:  93/90/30 Voiding on own Goal: Discharge plan remains appropriate-arrangements made Outcome: Completed/Met Date Met:  07/23/13 Plans to discharge home

## 2013-07-23 NOTE — Progress Notes (Signed)
Video Bronchoscopy Done  No labs Airway exam Procedure tolerated well

## 2013-07-23 NOTE — Telephone Encounter (Signed)
Informed by Santiago Glad (Art therapist) that Kaitlin Reed is an inpatient and that Dr.Zubelevitskiy wanted to be called  at [phone].On calling Dr. Earnest Conroy he informed me that patient has new right lung cancer reoccurrence obstructing trachea and would like to have patient assessed for radiation as he has done what he can.Informed him Dr.Wentworth out of office until about 2:30 pm and I would inform her of patient status and location and to call him if she has further questions.He is fine with this and states it isn't an emergency.

## 2013-07-23 NOTE — Progress Notes (Signed)
PULMONARY / CRITICAL CARE MEDICINE  Name: Kaitlin Reed MRN: [id number] DOB: 1950-12-08    ADMISSION DATE:  07/21/2013 CONSULTATION DATE:  07/22/2013  REFERRING MD :  Dr. Doylene Canard  PRIMARY SERVICE:  Dr. 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  Noted increase over week prior to admission.  Patient admitted for evaluation.   SIGNIFICANT EVENTS / STUDIES:  3/27  Bronchoscopy >>> R vocal cord paralysis, tumor eroding into trachea from the right, oozing blood, partially obstructing trachea  CULTURES:  ANTIBIOTICS:  INTERVAL HISTORY:  Bronchoscopy, tolerated well  VITAL SIGNS: Temp:  [97.9 F (36.6 C)-100.3 F (37.9 C)] 98.6 F (37 C) (03/27 0820) Pulse Rate:  [87-102] 90 (03/27 0852) Resp:  [11-29] 28 (03/27 0852) BP: (84-132)/(39-81) 98/48 mmHg (03/27 0852) SpO2:  [92 %-100 %] 92 % (03/27 0852) Weight:  [59.784 kg (131 lb 12.8 oz)] 59.784 kg (131 lb 12.8 oz) (03/27 0509)  PHYSICAL EXAMINATION: General:  No disstress Neuro:  Awake, alert HEENT:  PERRL Cardiovascular:  Regular, no murmurs Lungs:  Bilateral rhonchi Abdomen:  Soft, non tender Musculoskeletal:  Moves all extremities Skin:  No rash  LABS: CBC  Recent Labs Lab 07/20/13 1354 07/21/13 2210 07/22/13 0515  WBC 7.2 5.3 4.3  HGB 10.0* 8.3* 8.1*  HCT 30.4* 25.1* 24.4*  PLT 432* 335 318   Coag's  Recent Labs Lab 07/21/13 2210  APTT 32  INR 1.12   BMET  Recent Labs Lab 07/20/13 1354 07/21/13 2210 07/22/13 0515  NA 138 135* 140  K 3.8 3.9 3.7  CL  --  98 104  CO2 24 22 22   BUN 20.7 13 11   CREATININE 1.3* 0.83 0.76  GLUCOSE 95 96 93   Electrolytes  Recent Labs Lab 07/20/13 1354 07/20/13 1354 07/21/13 2210 07/22/13 0515  CALCIUM  --  10.1 8.1* 8.1*  MG 2.2  --   --   --   PHOS 4.2  --   --   --    Sepsis Markers No results found for this basename:  LATICACIDVEN, PROCALCITON, O2SATVEN,  in the last 168 hours ABG No results found for this basename: PHART, PCO2ART, PO2ART,  in the last 168 hours Liver Enzymes  Recent Labs Lab 07/20/13 1354  AST 20  ALT <6  ALKPHOS 269*  BILITOT 0.21  ALBUMIN 3.7   Cardiac Enzymes No results found for this basename: TROPONINI, PROBNP,  in the last 168 hours Glucose No results found for this basename: GLUCAP,  in the last 168 hours  IMAGING: X-ray Chest Pa And Lateral   07/22/2013   CLINICAL DATA:  Hemoptysis  EXAM: CHEST  2 VIEW  COMPARISON:  07/13/2013 chest CT  FINDINGS: Right IJ porta catheter, tip at the superior cavoatrial junction. Normal heart size. No evidence of increasing pericardial effusion. 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  No infiltrate or edema. No effusion or pneumothorax. There is chronic blunting of the lateral left costophrenic sulcus related to scarring. Bandlike opacity in the posterior left lung appears unchanged. Osseous metastatic disease noted.  IMPRESSION: 1. No active cardiopulmonary disease. 2. Intrathoracic and osseous malignancy.   Electronically Signed   By: Jorje Guild M.D.   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  Doree Fudge, MD Pulmonary and [place] Pager: [phone]  07/23/2013, 8:55 AM

## 2013-07-23 NOTE — Progress Notes (Signed)
Subjective:  Feeling better about tumor in trachea seen on bronchoscopy can be treated with radiation to decrease bleeding. T max 100.3 F  Objective:  Vital Signs in the last 24 hours: Temp:  [97.9 F (36.6 C)-100.3 F (37.9 C)] 98.4 F (36.9 C) (03/27 1023) Pulse Rate:  [87-102] 97 (03/27 1023) Cardiac Rhythm:  [-] Normal sinus rhythm (03/26 2055) Resp:  [11-29] 18 (03/27 1023) BP: (84-132)/(33-81) 98/46 mmHg (03/27 1023) SpO2:  [92 %-100 %] 95 % (03/27 1023) Weight:  [59.784 kg (131 lb 12.8 oz)] 59.784 kg (131 lb 12.8 oz) (03/27 0509)  Physical Exam: BP Readings from Last 1 Encounters:  07/23/13 98/46     Wt Readings from Last 1 Encounters:  07/23/13 59.784 kg (131 lb 12.8 oz)    Weight change: -0.816 kg (-1 lb 12.8 oz)  HEENT: [place]/AT, Eyes-Brown, PERL, EOMI, Conjunctiva-Pale, Sclera-Non-icteric Neck: No JVD, No bruit, Trachea midline. Lungs:  Clear, Bilateral. Cardiac:  Regular rhythm, normal S1 and S2, no S3.  Abdomen:  Soft, non-tender. Extremities:  No edema present. No cyanosis. No clubbing. CNS: AxOx3, Cranial nerves grossly intact, moves all 4 extremities. Right handed. Skin: Warm and dry.   Intake/Output from previous day: 03/26 0701 - 03/27 0700 In: 840 [P.O.:840] Out: 2700 [Urine:2700]    Lab Results: BMET    Component Value Date/Time   NA 139 07/23/2013 1230   NA 138 07/20/2013 1354   NA 138 11/06/2011 1121   K 4.4 07/23/2013 1230   K 3.8 07/20/2013 1354   K 4.3 11/06/2011 1121   CL 102 07/23/2013 1230   CL 105 10/07/2012 1350   CL 98 11/06/2011 1121   CO2 23 07/23/2013 1230   CO2 24 07/20/2013 1354   CO2 30 11/06/2011 1121   GLUCOSE 89 07/23/2013 1230   GLUCOSE 95 07/20/2013 1354   GLUCOSE 123* 10/07/2012 1350   GLUCOSE 108 11/06/2011 1121   BUN 7 07/23/2013 1230   BUN 20.7 07/20/2013 1354   BUN 15 11/06/2011 1121   CREATININE 0.73 07/23/2013 1230   CREATININE 1.3* 07/20/2013 1354   CREATININE 0.9 11/06/2011 1121   CALCIUM 8.7 07/23/2013 1230   CALCIUM 10.1  07/20/2013 1354   CALCIUM 9.0 11/06/2011 1121   GFRNONAA 88* 07/23/2013 1230   GFRAA >90 07/23/2013 1230   CBC    Component Value Date/Time   WBC 6.6 07/23/2013 1230   WBC 7.2 07/20/2013 1354   RBC 3.00* 07/23/2013 1230   RBC 3.50* 07/20/2013 1354   HGB 8.5* 07/23/2013 1230   HGB 10.0* 07/20/2013 1354   HCT 25.7* 07/23/2013 1230   HCT 30.4* 07/20/2013 1354   PLT 323 07/23/2013 1230   PLT 432* 07/20/2013 1354   MCV 85.7 07/23/2013 1230   MCV 86.9 07/20/2013 1354   MCH 28.3 07/23/2013 1230   MCH 28.5 07/20/2013 1354   MCHC 33.1 07/23/2013 1230   MCHC 32.8 07/20/2013 1354   RDW 14.2 07/23/2013 1230   RDW 14.9* 07/20/2013 1354   LYMPHSABS 0.9 07/21/2013 2210   LYMPHSABS 1.2 07/20/2013 1354   MONOABS 0.4 07/21/2013 2210   MONOABS 0.7 07/20/2013 1354   EOSABS 0.1 07/21/2013 2210   EOSABS 0.1 07/20/2013 1354   BASOSABS 0.0 07/21/2013 2210   BASOSABS 0.1 07/20/2013 1354   CARDIAC ENZYMES Lab Results  Component Value Date   CKTOTAL 164 04/17/2011   CKMB 2.3 04/17/2011   TROPONINI <0.30 01/21/2012    Scheduled Meds: . calcium carbonate  1 tablet Oral BID WC  . cholecalciferol  2,000 Units Oral Daily  . docusate sodium  100 mg Oral BID  . ferrous sulfate  325 mg Oral Q breakfast  . metoprolol tartrate  25 mg Oral BID  . multivitamin with minerals  1 tablet Oral Daily  . potassium chloride  20 mEq Oral BID  . sodium chloride  3 mL Intravenous Q12H  . vitamin B-12  1,000 mcg Oral Daily  . vitamin C  1,000 mg Oral Daily   Continuous Infusions: . sodium chloride 10 mL/hr at 07/23/13 1032   PRN Meds:.acetaminophen (TYLENOL) oral liquid 160 mg/5 mL, alum & mag hydroxide-simeth, chlorpheniramine-HYDROcodone, fentaNYL, lidocaine, lidocaine, midazolam, ondansetron (ZOFRAN) IV, ondansetron, phenylephrine, sodium chloride, temazepam  Assessment/Plan: Acute hemoptysis  Sinus tachycardia -resolved  Possible dehydration -improved  Anemia of chronic disease and blood loss and iron deficiency  Non-small cell  lung cancer with recurrence Left hip pain  Abdominal pain-improving  Continue medical treatment. Increase iron supplement to bid.   LOS: 2 days    Dixie Dials  MD  07/23/2013, 1:12 PM

## 2013-07-23 NOTE — Op Note (Signed)
Name:  Kaitlin Reed MRN:  [id number] DOB:  December 06, 1950  PROCEDURE NOTE  Procedure:   Flexible bronchoscopy (79038)  Indications:  Hemoptysis   Consent:  Procedure, benefits, risks and alternatives discussed.  Questions answered.  Consent obtained.  Anesthesia:  Already sedated for mechanical ventilation.   Procedure summary:  Appropriate equipment was assembled.  The patient was identified as Kaitlin Reed.  Safety timeout was performed. The patient was placed supine and adequate level of sedation was assured.  Flexible bronchoscope was lubricated and inserted nasally.  Airway examination was performed bilaterally and images were taken.  Bloody secretions were secretions were noted bilaterally and aspirated.  After hemostasis was assured, the bronchoscope was withdrawn.  Findings:  1.  Right vocal cord paralysis 2.  Fungating mass eroding through the right tracheal wall, actively oozing blood and obstructing 1/3 of tracheal lumen. 3.  Left lower lobectomy stump intact, without evidence of recurrence.   Complications:  No immediate complications were noted.  Hemodynamic parameters and oxygenation remained stable throughout the procedure.  Estimated blood loss:  Less then 5 mL.  Penne Lash, M.D. Pulmonary and [place] Pager: [phone]  07/23/2013, 8:43 AM

## 2013-07-23 NOTE — Progress Notes (Signed)
The patient complained of mild hip pain and a headache last night, but refused any medication.  Her VS were stable and she had a small amount of hemoptysis overnight.  Her temperature was 100.3 this morning.

## 2013-07-23 NOTE — Progress Notes (Addendum)
Pt refused most of 1000 medications.  Post-EGD, order was kept to keep her NPO.  She is coughing up small amounts of blood intermittently.  Alert and oriented x 4.  Skin warm and dry.  Dr. Doylene Canard paged to inform

## 2013-07-23 NOTE — Progress Notes (Signed)
Spoke with Rolley Sims (research nurse) regarding patient's status.Paient is on clinical trial (nivolumab) that indicates no radiation therapy to be given within 7 days of treatment.Last treatment was on Tuesday 07/20/13.patient is scheduled to follow up next Tuesday to see Dr.Wentworth and to have ct simulation.Santiago Glad scheduler to call inpatient nurse to give these appointments.

## 2013-07-23 NOTE — Progress Notes (Signed)
Esvin Hnat, MD Pulmonary and Critical Care Medicine [facility] Pager: [phone]  

## 2013-07-23 NOTE — Progress Notes (Addendum)
Dr. Doylene Canard informed f/u appt for cancer center to see Dr. Pablo Ledger has been scheduled for July 27, 2013 @ 2:45pm.  Information given to patient on written note.

## 2013-07-24 LAB — BASIC METABOLIC PANEL
BUN: 9 mg/dL (ref 6–23)
CALCIUM: 8.8 mg/dL (ref 8.4–10.5)
CO2: 20 meq/L (ref 19–32)
CREATININE: 0.78 mg/dL (ref 0.50–1.10)
Chloride: 98 mEq/L (ref 96–112)
GFR, EST NON AFRICAN AMERICAN: 86 mL/min — AB (ref >90)
Glucose, Bld: 73 mg/dL (ref 70–99)
Potassium: 4.6 mEq/L (ref 3.7–5.3)
Sodium: 136 mEq/L — ABNORMAL LOW (ref 137–147)

## 2013-07-24 LAB — CBC
HCT: 25.1 % — ABNORMAL LOW (ref 36.0–46.0)
Hemoglobin: 8.1 g/dL — ABNORMAL LOW (ref 12.0–15.0)
MCH: 27.7 pg (ref 26.0–34.0)
MCHC: 32.3 g/dL (ref 30.0–36.0)
MCV: 86 fL (ref 78.0–100.0)
Platelets: 335 10*3/uL (ref 150–400)
RBC: 2.92 MIL/uL — ABNORMAL LOW (ref 3.87–5.11)
RDW: 14.1 % (ref 11.5–15.5)
WBC: 5.7 10*3/uL (ref 4.0–10.5)

## 2013-07-24 MED ORDER — OXYCODONE-ACETAMINOPHEN 5-325 MG PO TABS
1.0000 | ORAL_TABLET | Freq: Four times a day (QID) | ORAL | Status: DC | PRN
  Administered 2013-07-24 (×2): 1 via ORAL
  Filled 2013-07-24: qty 1

## 2013-07-24 NOTE — Progress Notes (Signed)
Subjective:  Feeling weak today. No chest pain.  Objective:  Vital Signs in the last 24 hours: Temp:  [97.8 F (36.6 C)-98.3 F (36.8 C)] 98 F (36.7 C) (03/28 0940) Pulse Rate:  [92-93] 93 (03/28 0940) Cardiac Rhythm:  [-] Sinus tachycardia (03/28 0813) Resp:  [18] 18 (03/28 0940) BP: (92-111)/(39-45) 92/42 mmHg (03/28 0940) SpO2:  [95 %-100 %] 95 % (03/28 0940) Weight:  [59.512 kg (131 lb 3.2 oz)] 59.512 kg (131 lb 3.2 oz) (03/28 0537)  Physical Exam: BP Readings from Last 1 Encounters:  07/24/13 92/42     Wt Readings from Last 1 Encounters:  07/24/13 59.512 kg (131 lb 3.2 oz)    Weight change: -0.272 kg (-9.6 oz)  HEENT: [place]/AT, Eyes-Brown, PERL, EOMI, Conjunctiva-Pale, Sclera-Non-icteric Neck: No JVD, No bruit, Trachea midline. Lungs:  Clear, Bilateral. Cardiac:  Regular rhythm, normal S1 and S2, no S3.  Abdomen:  Soft, non-tender. Extremities:  No edema present. No cyanosis. No clubbing. CNS: AxOx3, Cranial nerves grossly intact, moves all 4 extremities. Right handed. Skin: Warm and dry.   Intake/Output from previous day: 03/27 0701 - 03/28 0700 In: 780 [P.O.:720; I.V.:60] Out: 2300 [Urine:2300]    Lab Results: BMET    Component Value Date/Time   NA 136* 07/24/2013 0500   NA 138 07/20/2013 1354   NA 138 11/06/2011 1121   K 4.6 07/24/2013 0500   K 3.8 07/20/2013 1354   K 4.3 11/06/2011 1121   CL 98 07/24/2013 0500   CL 105 10/07/2012 1350   CL 98 11/06/2011 1121   CO2 20 07/24/2013 0500   CO2 24 07/20/2013 1354   CO2 30 11/06/2011 1121   GLUCOSE 73 07/24/2013 0500   GLUCOSE 95 07/20/2013 1354   GLUCOSE 123* 10/07/2012 1350   GLUCOSE 108 11/06/2011 1121   BUN 9 07/24/2013 0500   BUN 20.7 07/20/2013 1354   BUN 15 11/06/2011 1121   CREATININE 0.78 07/24/2013 0500   CREATININE 1.3* 07/20/2013 1354   CREATININE 0.9 11/06/2011 1121   CALCIUM 8.8 07/24/2013 0500   CALCIUM 10.1 07/20/2013 1354   CALCIUM 9.0 11/06/2011 1121   GFRNONAA 86* 07/24/2013 0500   GFRAA >90 07/24/2013  0500   CBC    Component Value Date/Time   WBC 5.7 07/24/2013 0500   WBC 7.2 07/20/2013 1354   RBC 2.92* 07/24/2013 0500   RBC 3.50* 07/20/2013 1354   HGB 8.1* 07/24/2013 0500   HGB 10.0* 07/20/2013 1354   HCT 25.1* 07/24/2013 0500   HCT 30.4* 07/20/2013 1354   PLT 335 07/24/2013 0500   PLT 432* 07/20/2013 1354   MCV 86.0 07/24/2013 0500   MCV 86.9 07/20/2013 1354   MCH 27.7 07/24/2013 0500   MCH 28.5 07/20/2013 1354   MCHC 32.3 07/24/2013 0500   MCHC 32.8 07/20/2013 1354   RDW 14.1 07/24/2013 0500   RDW 14.9* 07/20/2013 1354   LYMPHSABS 0.9 07/21/2013 2210   LYMPHSABS 1.2 07/20/2013 1354   MONOABS 0.4 07/21/2013 2210   MONOABS 0.7 07/20/2013 1354   EOSABS 0.1 07/21/2013 2210   EOSABS 0.1 07/20/2013 1354   BASOSABS 0.0 07/21/2013 2210   BASOSABS 0.1 07/20/2013 1354   CARDIAC ENZYMES Lab Results  Component Value Date   CKTOTAL 164 04/17/2011   CKMB 2.3 04/17/2011   TROPONINI <0.30 01/21/2012    Scheduled Meds: . calcium carbonate  1 tablet Oral BID WC  . cholecalciferol  2,000 Units Oral Daily  . docusate sodium  100 mg Oral BID  . ferrous  sulfate  325 mg Oral BID WC  . multivitamin with minerals  1 tablet Oral Daily  . potassium chloride  20 mEq Oral BID  . sodium chloride  3 mL Intravenous Q12H  . vitamin B-12  1,000 mcg Oral Daily  . vitamin C  1,000 mg Oral Daily   Continuous Infusions: . sodium chloride 10 mL/hr at 07/23/13 1032   PRN Meds:.acetaminophen (TYLENOL) oral liquid 160 mg/5 mL, alum & mag hydroxide-simeth, chlorpheniramine-HYDROcodone, fentaNYL, lidocaine, lidocaine, midazolam, ondansetron (ZOFRAN) IV, ondansetron, phenylephrine, sodium chloride, temazepam  Assessment/Plan: Acute hemoptysis  Sinus tachycardia -resolved  Possible dehydration -improved  Anemia of chronic disease and blood loss and iron deficiency  Non-small cell lung cancer with recurrence  Left hip pain  Abdominal pain-improving Weakness  PT evaluation and treatment.    LOS: 3 days    Dixie Dials  MD  07/24/2013, 2:25 PM

## 2013-07-24 NOTE — Progress Notes (Signed)
Pt states feeling weaker today then yesterday. Pt state " I just feel so weak this morning". Pt up in bed eating breakfast this AM. Will continue to monitor.

## 2013-07-24 NOTE — Progress Notes (Signed)
The patient's small to moderate amount of hemoptysis continued overnight.  Her BPs remained soft and were 97/39 this AM.  She complained of mild pain in her hip at the beginning of the shift, but refused any pain medications.  She received Restoril and slept a large part of the night.

## 2013-07-25 LAB — CBC
HCT: 24 % — ABNORMAL LOW (ref 36.0–46.0)
Hemoglobin: 7.8 g/dL — ABNORMAL LOW (ref 12.0–15.0)
MCH: 27.7 pg (ref 26.0–34.0)
MCHC: 32.5 g/dL (ref 30.0–36.0)
MCV: 85.1 fL (ref 78.0–100.0)
Platelets: 342 10*3/uL (ref 150–400)
RBC: 2.82 MIL/uL — AB (ref 3.87–5.11)
RDW: 14 % (ref 11.5–15.5)
WBC: 5.6 10*3/uL (ref 4.0–10.5)

## 2013-07-25 MED ORDER — VITAMIN C 500 MG PO TABS
1000.0000 mg | ORAL_TABLET | Freq: Every day | ORAL | Status: DC
  Administered 2013-07-26: 1000 mg via ORAL
  Filled 2013-07-25 (×2): qty 2

## 2013-07-25 NOTE — Evaluation (Signed)
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  Noted increase over week prior to admission.   Clinical Impression  Pt adm with the above dx. Pt able to perform all functional mobility activities at supervision level without LOB and in a safe manner. Pt reports feeling fatigued and weak. At the end of PT session pt felt nauseous and proceeded to vomit. It is reccommended that the pt have supervision upon d/c for assistance if needed. No further acute skilled PT at this time. PT signing off. Please re-consult if services needed in future.    Follow Up Recommendations Supervision/Assistance - 24 hour;No PT follow up    Equipment Recommendations       Recommendations for Other Services       Precautions / Restrictions Precautions Precautions: None Restrictions Weight Bearing Restrictions: No      Mobility  Bed Mobility Overal bed mobility: Modified Independent                Transfers Overall transfer level: Needs assistance Equipment used: None Transfers: Sit to/from Stand Sit to Stand: Supervision         General transfer comment: able to sit to stand without hesitation or difficulty. supervision due to first time seeing pt   Ambulation/Gait Ambulation/Gait assistance: Supervision Ambulation Distance (Feet): 350 Feet Assistive device: None Gait Pattern/deviations: Step-through pattern Gait velocity: baseline. normal speed   General Gait Details: able to perform higher level balance activities during amb  Stairs            Wheelchair Mobility    Modified Rankin (Stroke Patients Only)       Balance Overall balance assessment: Needs assistance Sitting-balance support: No upper extremity supported;Feet supported Sitting  balance-Leahy Scale: Normal     Standing balance support: No upper extremity supported Standing balance-Leahy Scale: Normal Standing balance comment: ale to perform higher level balance activities             High level balance activites: Turns;Sudden stops;Head turns;Other (comment) High Level Balance Comments: pt able to SLS Bilat for 10+ seconds, rhomberg eyes closed, tandem stance for 10+ seconds, change gait speed to normal to fast to slow, perform head turns (L,R,up/down) during amb, and perform 5 quick mini squats all without LOB or sway.       Pertinent Vitals/Pain Pt reports no pain    Home Living Family/patient expects to be discharged to:: Private residence Living Arrangements: Alone Available Help at Discharge: Family (grandaughter ) Type of Home: House Home Access: Level entry     Home Layout: One level Home Equipment: None Additional Comments: pt reports living alone but her granddaughter will be visiting for two weeks.     Prior Function Level of Independence: Independent               Hand Dominance        Extremity/Trunk Assessment   Upper Extremity Assessment: Overall WFL for tasks assessed           Lower Extremity Assessment: Overall WFL for tasks assessed      Cervical / Trunk Assessment: Normal  Communication   Communication: No difficulties  Cognition Arousal/Alertness: Awake/alert Behavior During Therapy: WFL for tasks assessed/performed Overall Cognitive Status: Within Functional Limits for tasks assessed  General Comments General comments (skin integrity, edema, etc.): after amb and standing balance activities pt became nauseaus when seated in chair and vomitted in trashcan. RN aware of situation. pt reports feeling fatigued during amb and activities    Exercises        Assessment/Plan    PT Assessment Patent does not need any further PT services  PT Diagnosis     PT Problem List    PT  Treatment Interventions     PT Goals (Current goals can be found in the Care Plan section) Acute Rehab PT Goals PT Goal Formulation: No goals set, d/c therapy    Frequency     Barriers to discharge        End of Session Equipment Utilized During Treatment: Gait belt Activity Tolerance: Patient limited by fatigue Patient left: in chair;with call bell/phone within reach         Time: 0720-0746 PT Time Calculation (min): 26 min   Charges:   PT Evaluation $Initial PT Evaluation Tier I: 1 Procedure PT Treatments $Gait Training: 8-22 mins   PT G Codes:          Manley Mason SPT 07/25/2013, 9:54 AM  Agree with above assessment.  Kittie Plater, PT, DPT Pager #: [phone] Office #: [phone]

## 2013-07-25 NOTE — Progress Notes (Signed)
Patient rested quietly through the night with no complaints of pain.

## 2013-07-25 NOTE — Evaluation (Signed)
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  Noted increase over week prior to admission.   Clinical Impression  Pt adm with the above dx. Pt able to perform all functional mobility activities at supervision level without LOB and in a safe manner. Pt reports feeling fatigued and weak. At the end of PT session pt felt nauseous and proceeded to vomit. It is reccommended that the pt have supervision upon d/c for assistance if needed. No further acute skilled PT at this time. PT signing off. Please re-consult if services needed in future.    Follow Up Recommendations Supervision/Assistance - 24 hour    Equipment Recommendations       Recommendations for Other Services       Precautions / Restrictions Precautions Precautions: None Restrictions Weight Bearing Restrictions: No      Mobility  Bed Mobility Overal bed mobility: Modified Independent                Transfers Overall transfer level: Needs assistance Equipment used: None Transfers: Sit to/from Stand Sit to Stand: Supervision         General transfer comment: able to sit to stand without hesitation or difficulty. supervision due to first time seeing pt   Ambulation/Gait Ambulation/Gait assistance: Supervision Ambulation Distance (Feet): 350 Feet Assistive device: None Gait Pattern/deviations: Step-through pattern Gait velocity: baseline. normal speed   General Gait Details: able to perform higher level balance activities during amb  Stairs            Wheelchair Mobility    Modified Rankin (Stroke Patients Only)       Balance Overall balance assessment: Needs assistance Sitting-balance support: No upper extremity supported;Feet supported Sitting balance-Leahy  Scale: Normal     Standing balance support: No upper extremity supported Standing balance-Leahy Scale: Normal Standing balance comment: ale to perform higher level balance activities             High level balance activites: Turns;Sudden stops;Head turns;Other (comment) High Level Balance Comments: pt able to SLS Bilat for 10+ seconds, rhomberg eyes closed, tandem stance for 10+ seconds, change gait speed to normal to fast to slow, perform head turns (L,R,up/down) during amb, and perform 5 quick mini squats all without LOB or sway.       Pertinent Vitals/Pain Pt reports no pain    Home Living Family/patient expects to be discharged to:: Private residence Living Arrangements: Alone Available Help at Discharge: Family (grandaughter ) Type of Home: House Home Access: Level entry     Home Layout: One level Home Equipment: None Additional Comments: pt reports living alone but her granddaughter will be visiting for two weeks.     Prior Function Level of Independence: Independent               Hand Dominance        Extremity/Trunk Assessment   Upper Extremity Assessment: Overall WFL for tasks assessed           Lower Extremity Assessment: Overall WFL for tasks assessed      Cervical / Trunk Assessment: Normal  Communication   Communication: No difficulties  Cognition Arousal/Alertness: Awake/alert Behavior During Therapy: WFL for tasks assessed/performed Overall Cognitive Status: Within Functional Limits for tasks assessed  General Comments General comments (skin integrity, edema, etc.): after amb and standing balance activities pt became nauseaus when seated in chair and vomitted in trashcan. RN aware of situation. pt reports feeling fatigued during amb and activities    Exercises        Assessment/Plan    PT Assessment Patent does not need any further PT services  PT Diagnosis     PT Problem List    PT Treatment  Interventions     PT Goals (Current goals can be found in the Care Plan section) Acute Rehab PT Goals PT Goal Formulation: No goals set, d/c therapy    Frequency     Barriers to discharge        End of Session Equipment Utilized During Treatment: Gait belt Activity Tolerance: Patient limited by fatigue Patient left: in chair;with call bell/phone within reach         Time: 0720-0746 PT Time Calculation (min): 26 min   Charges:         PT G Codes:          Artia Singley SPT 07/25/2013, 8:13 AM

## 2013-07-25 NOTE — Progress Notes (Signed)
Subjective:  Possible allergy to oxycodone. Mild facial swelling. Afebrile  Objective:  Vital Signs in the last 24 hours: Temp:  [97.8 F (36.6 C)-98.7 F (37.1 C)] 97.8 F (36.6 C) (03/29 1019) Pulse Rate:  [95-101] 101 (03/29 1019) Cardiac Rhythm:  [-] Sinus tachycardia (03/28 2100) Resp:  [18-20] 20 (03/29 1019) BP: (91-111)/(37-50) 111/50 mmHg (03/29 1019) SpO2:  [95 %-98 %] 95 % (03/29 1019) Weight:  [58.968 kg (130 lb)] 58.968 kg (130 lb) (03/29 0506)  Physical Exam: BP Readings from Last 1 Encounters:  07/25/13 111/50     Wt Readings from Last 1 Encounters:  07/25/13 58.968 kg (130 lb)    Weight change: -0.544 kg (-1 lb 3.2 oz)  HEENT: [place]/AT, Eyes-Brown, PERL, EOMI, Conjunctiva-Pink, Sclera-Non-icteric Neck: No JVD, No bruit, Trachea midline. Lungs:  Clear, Bilateral. Cardiac:  Regular rhythm, normal S1 and S2, no S3.  Abdomen:  Soft, non-tender. Extremities:  No edema present. No cyanosis. No clubbing. CNS: AxOx3, Cranial nerves grossly intact, moves all 4 extremities. Right handed. Skin: Warm and dry.   Intake/Output from previous day: 03/28 0701 - 03/29 0700 In: 960 [P.O.:700; I.V.:260] Out: 1600 [Urine:1600]    Lab Results: BMET    Component Value Date/Time   NA 136* 07/24/2013 0500   NA 138 07/20/2013 1354   NA 138 11/06/2011 1121   K 4.6 07/24/2013 0500   K 3.8 07/20/2013 1354   K 4.3 11/06/2011 1121   CL 98 07/24/2013 0500   CL 105 10/07/2012 1350   CL 98 11/06/2011 1121   CO2 20 07/24/2013 0500   CO2 24 07/20/2013 1354   CO2 30 11/06/2011 1121   GLUCOSE 73 07/24/2013 0500   GLUCOSE 95 07/20/2013 1354   GLUCOSE 123* 10/07/2012 1350   GLUCOSE 108 11/06/2011 1121   BUN 9 07/24/2013 0500   BUN 20.7 07/20/2013 1354   BUN 15 11/06/2011 1121   CREATININE 0.78 07/24/2013 0500   CREATININE 1.3* 07/20/2013 1354   CREATININE 0.9 11/06/2011 1121   CALCIUM 8.8 07/24/2013 0500   CALCIUM 10.1 07/20/2013 1354   CALCIUM 9.0 11/06/2011 1121   GFRNONAA 86* 07/24/2013 0500    GFRAA >90 07/24/2013 0500   CBC    Component Value Date/Time   WBC 5.6 07/25/2013 0521   WBC 7.2 07/20/2013 1354   RBC 2.82* 07/25/2013 0521   RBC 3.50* 07/20/2013 1354   HGB 7.8* 07/25/2013 0521   HGB 10.0* 07/20/2013 1354   HCT 24.0* 07/25/2013 0521   HCT 30.4* 07/20/2013 1354   PLT 342 07/25/2013 0521   PLT 432* 07/20/2013 1354   MCV 85.1 07/25/2013 0521   MCV 86.9 07/20/2013 1354   MCH 27.7 07/25/2013 0521   MCH 28.5 07/20/2013 1354   MCHC 32.5 07/25/2013 0521   MCHC 32.8 07/20/2013 1354   RDW 14.0 07/25/2013 0521   RDW 14.9* 07/20/2013 1354   LYMPHSABS 0.9 07/21/2013 2210   LYMPHSABS 1.2 07/20/2013 1354   MONOABS 0.4 07/21/2013 2210   MONOABS 0.7 07/20/2013 1354   EOSABS 0.1 07/21/2013 2210   EOSABS 0.1 07/20/2013 1354   BASOSABS 0.0 07/21/2013 2210   BASOSABS 0.1 07/20/2013 1354   CARDIAC ENZYMES Lab Results  Component Value Date   CKTOTAL 164 04/17/2011   CKMB 2.3 04/17/2011   TROPONINI <0.30 01/21/2012    Scheduled Meds: . calcium carbonate  1 tablet Oral BID WC  . cholecalciferol  2,000 Units Oral Daily  . docusate sodium  100 mg Oral BID  . ferrous sulfate  325 mg Oral BID WC  . multivitamin with minerals  1 tablet Oral Daily  . potassium chloride  20 mEq Oral BID  . sodium chloride  3 mL Intravenous Q12H  . vitamin B-12  1,000 mcg Oral Daily  . [START ON 07/26/2013] vitamin C  1,000 mg Oral Daily   Continuous Infusions: . sodium chloride 10 mL/hr at 07/23/13 1032   PRN Meds:.acetaminophen (TYLENOL) oral liquid 160 mg/5 mL, alum & mag hydroxide-simeth, ondansetron (ZOFRAN) IV, ondansetron, phenylephrine, sodium chloride, temazepam  Assessment/Plan: Acute hemoptysis  Sinus tachycardia -resolved  Possible dehydration -improved  Anemia of chronic disease and blood loss and iron deficiency  Non-small cell lung cancer with recurrence  Left hip pain  Abdominal pain-improving  Weakness Possible oxycodone allergy  Discontinue narcotic analgesics. Monitor.   LOS: 4 days     Dixie Dials  MD  07/25/2013, 12:02 PM

## 2013-07-26 ENCOUNTER — Telehealth: Payer: Self-pay

## 2013-07-26 ENCOUNTER — Encounter (HOSPITAL_COMMUNITY): Payer: Self-pay | Admitting: Pulmonary Disease

## 2013-07-26 LAB — HEMOGLOBIN AND HEMATOCRIT, BLOOD
HEMATOCRIT: 26.9 % — AB (ref 36.0–46.0)
HEMOGLOBIN: 9.1 g/dL — AB (ref 12.0–15.0)

## 2013-07-26 LAB — PREPARE RBC (CROSSMATCH)

## 2013-07-26 MED ORDER — POTASSIUM CHLORIDE CRYS ER 10 MEQ PO TBCR: 10.0000 meq | EXTENDED_RELEASE_TABLET | Freq: Two times a day (BID) | ORAL | Status: DC

## 2013-07-26 MED ORDER — FERROUS SULFATE 325 (65 FE) MG PO TABS: 325.0000 mg | ORAL_TABLET | Freq: Two times a day (BID) | ORAL | Status: AC

## 2013-07-26 NOTE — Progress Notes (Signed)
PULMONARY / CRITICAL CARE MEDICINE  Name: Kaitlin Reed MRN: [id number] DOB: 10-Mar-1951    ADMISSION DATE:  07/21/2013 CONSULTATION DATE:  07/22/2013  REFERRING MD :  Dr. Doylene Canard  PRIMARY SERVICE:  Dr. 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  Noted increase over week prior to admission.  Patient admitted for evaluation.   SIGNIFICANT EVENTS / STUDIES:  3/27  Bronchoscopy >>> R vocal cord paralysis, tumor eroding into trachea from the right, oozing blood, partially obstructing trachea  CULTURES:  ANTIBIOTICS:  INTERVAL HISTORY:  Nausea with 2 episodes of vomitting overnight, possible oxycodone allergy per cards progress note (with mild facial swelling). Pt reports that she feels very weak, has no energy to get up.  If tries to stand, gets lightheaded making her have to lie back down.  VITAL SIGNS: Temp:  [98 F (36.7 C)-98.3 F (36.8 C)] 98 F (36.7 C) (03/30 0634) Pulse Rate:  [101-103] 103 (03/30 0634) Resp:  [18-20] 18 (03/30 0634) BP: (92-105)/(40-68) 94/55 mmHg (03/30 0634) SpO2:  [94 %-98 %] 95 % (03/30 0634) Weight:  [129 lb 1.6 oz (58.559 kg)] 129 lb 1.6 oz (58.559 kg) (03/30 5366)  PHYSICAL EXAMINATION: General:  Pleasant female, resting in bed, in NAD but complains of being "very weak since last night". Neuro:  A&O x 3, non-focal. HEENT:  PERRL Cardiovascular:  Regular, no murmurs Lungs:  CTA bilaterally, no W/R/R. Abdomen:  Soft, non tender Musculoskeletal:  Moves all extremities Skin:  No rash  LABS: CBC  Recent Labs Lab 07/23/13 1230 07/24/13 0500 07/25/13 0521  WBC 6.6 5.7 5.6  HGB 8.5* 8.1* 7.8*  HCT 25.7* 25.1* 24.0*  PLT 323 335 342   Coag's  Recent Labs Lab 07/21/13 2210  APTT 32  INR 1.12   BMET  Recent Labs Lab 07/22/13 0515 07/23/13 1230 07/24/13 0500  NA 140 139 136*  K 3.7 4.4 4.6  CL  104 102 98  CO2 22 23 20   BUN 11 7 9   CREATININE 0.76 0.73 0.78  GLUCOSE 93 89 73   Electrolytes  Recent Labs Lab 07/20/13 1354  07/22/13 0515 07/23/13 1230 07/24/13 0500  CALCIUM  --   < > 8.1* 8.7 8.8  MG 2.2  --   --   --   --   PHOS 4.2  --   --   --   --   < > = values in this interval not displayed. Sepsis Markers No results found for this basename: LATICACIDVEN, PROCALCITON, O2SATVEN,  in the last 168 hours ABG No results found for this basename: PHART, PCO2ART, PO2ART,  in the last 168 hours Liver Enzymes  Recent Labs Lab 07/20/13 1354  AST 20  ALT <6  ALKPHOS 269*  BILITOT 0.21  ALBUMIN 3.7   Cardiac Enzymes No results found for this basename: TROPONINI, PROBNP,  in the last 168 hours Glucose No results found for this basename: GLUCAP,  in the last 168 hours  IMAGING: No results found.   ASSESSMENT / PLAN:  NSCLCA, s/p left lower lobectomy, s/p two cycles of Nivolumab and Zometa R side recurrence with partial tracheal obstruction and active blood oozing ( Images available in Epic under Chart Review >>> Media >>> Bronchoscopy ) Hemoptysis secondary to above R vocal cord paralysis ? COPD / Emphysema - not on any home meds, can not find PFT's in system.  Pt reports she has never seen a pulmonologist before.  - Hemoptysis thought to unlikely be controlled with local measures. - XRT consulted, Pt currently on clinical trial (Nivolumab) that indicates no XRT within 7 days of last treatment.  Last treatment 3/24, pt is to follow up with Dr. Pablo Ledger 3/31 for CT simulation --> Will probably need to reschedule as pt does not seem ready for discharge as of 07/26/13, will defer to primary team. - Supplemental oxygen as needed. - Check H/H now.  Given that pt is symptomatic, may need transfusion despite last Hgb of 7.8. - Consider re-consulting PT for ambulation assistance. Pt strongly requesting to get OOB and ambulate with assistance. - Avoid ASA / Heparin. -  Will follow.   Montey Hora, [facility] Pgr: [phone]  or [phone]  Attending:  I have seen and examined the patient with nurse practitioner/resident and agree with the note above.   Needs XRT this week  PCCM to sign off, call if questions  Jillyn Hidden PCCM Pager: [phone] Cell: [phone] If no response, call [phone]

## 2013-07-26 NOTE — Progress Notes (Signed)
Call received from nurse Val working with Dr. Unknown Jim informed her that Dr. Doylene Canard instructed not to set up transport arrangement as pt will be d/c tomorrow.  Stated she will call tomorrow to see if pt will be d/c.  Karie Kirks, Therapist, sports.

## 2013-07-26 NOTE — Discharge Summary (Signed)
Physician Discharge Summary  Patient ID: Kaitlin Reed MRN: [id number] DOB/AGE: 12-23-1950 63 y.o.  Admit date: 07/21/2013 Discharge date: 07/26/2013  Admission Diagnoses: Acute hemoptysis  Sinus tachycardia  Possible dehydration  Anemia of chronic disease and blood loss and iron deficiency  Non-small cell lung cancer with recurrence  Left hip pain  Abdominal pain Weakness    Discharge Diagnoses:  Principle Problem: * Acute blood loss anemia * Cough with hemoptysis Non-small cell lung cancer with recurrence and metastasis Iron deficiency anemia   Sinus tachycardia  Dehydration Generalized weakness Left hip pain  Abdominal pain  Possible oxycodone allergy  Discharged Condition: poor  Hospital Course: 63 year old female with known history of non-small cell lung cancer since 07/2009 had left lower lobectomy with lymph node dissection on 10/17/2009. She had 2 episodes of large hemoptysis. She had been treated with Carboplatin and Alimta in 2011. With recurrence of cancer she had 2 cycles of Nivolumab immunotherapy and Zometa 4 mg IV every month since 05/17/2013. She reported 2 episodes of large hemoptysis prior to admission and ongoing some hemoptysis while in hospital. Pulmonary was consulted and on bronchoscopy tracheal involvement of tumor was discovered. Patient may undergo radiation treatment to decrease hemoptysis. Her aspirin was discontinued and her iron intake with ascorbic acid was doubled. She was advised to use walker all the time for her generalized weakness. She will be encouraged to use hospice services if radiation treatment do not work.  Consults: pulmonary/intensive care  Significant Diagnostic Studies: Labs: Normal WBC count and high normal platelets count. Borderline high TSH. Elevated LDH. Normal electrolytes and magnesium. Normal BUN/Creatinine post hydration.  Echocardiogram with preserved LV systolic function, mild diastolic dysfunction and small pericardial  effusion.  Flexible Bronchoscopy:  1. Right vocal cord paralysis  2. Fungating mass eroding through the right tracheal wall, actively oozing blood and obstructing 1/3 of tracheal lumen.  3. Left lower lobectomy stump intact, without evidence of recurrence.  Treatments: analgesia: Oxycodone and cardiac meds: metoprolol  Discharge Exam: Blood pressure 98/46, pulse 100, temperature 98.2 F (36.8 C), temperature source Oral, resp. rate 20, height 5\' 6"  (1.676 m), weight 58.559 kg (129 lb 1.6 oz), SpO2 92.00%.  HEENT: [place]/AT, Eyes-Brown, PERL, EOMI, Conjunctiva-Pale, Sclera-Non-icteric. Multiple dark brown freckles over face. Tongue-dry. Neck: No JVD, No bruit, Trachea midline.  Lungs: Clear, Bilateral.  Cardiac: Regular rhythm, normal S1 and S2, no S3.  Abdomen: Soft, non-tender.  Extremities: No edema present. No cyanosis. No clubbing.  CNS: AxOx3, Cranial nerves grossly intact, moves all 4 extremities. Right handed.  Skin: Warm and dry and pale.  Disposition: 01-Home or Self Care   Future Appointments Provider Department Dept Phone   07/27/2013 10:30 AM Thea Silversmith, [facility] [phone]   07/27/2013 11:00 AM Thea Silversmith, [facility] [phone]   Joint Appt [facility] [phone]   08/03/2013 1:15 PM [facility] [phone]   08/03/2013 1:45 PM [place], [facility] [phone]   08/03/2013 2:45 PM [facility] [phone]   08/05/2013 2:30 PM [facility]-Ct 1 [facility] [phone]   Liquids only 4 hours prior to your exam. Any medications can be taken as usual. Please arrive 15 min prior to your scheduled exam time.   08/11/2013 10:15 AM Collene Gobble, MD [facility]  [phone]    08/17/2013 2:15 PM [facility] Lab 1 [facility] [phone]   08/17/2013 2:45 PM [facility] B4 [facility] [phone]       Medication List    STOP taking these medications       zolendronic acid 4 MG/5ML injection  Commonly known as:  ZOMETA      TAKE these medications       aspirin EC 81 MG tablet  Take 81 mg by mouth daily.     calcium carbonate 600 MG Tabs tablet  Commonly known as:  OS-CAL  Take 600 mg by mouth 2 (two) times daily with a meal.     ferrous sulfate 325 (65 FE) MG tablet  Take 1 tablet (325 mg total) by mouth 2 (two) times daily with a meal.     metoprolol tartrate 25 MG tablet  Commonly known as:  LOPRESSOR  Take 25 mg by mouth 2 (two) times daily.     multivitamin with minerals Tabs tablet  Take 1 tablet by mouth daily.     NIVOLUMAB IV  Inject 3 mg/kg into the vein every 14 (fourteen) days. *iv infusion that runs for 42min*     potassium chloride 10 MEQ tablet  Commonly known as:  K-DUR,KLOR-CON  Take 1 tablet (10 mEq total) by mouth 2 (two) times daily.     PROLENSA 0.07 % Soln  Generic drug:  Bromfenac Sodium  Place 1 drop into the left eye daily.     Selenium 200 MCG Caps  Take 200 mcg by mouth daily.     temazepam 15 MG capsule  Commonly known as:  RESTORIL  Take 15 mg by mouth at bedtime as needed for sleep.     vitamin B-12 1000 MCG tablet  Commonly known as:  CYANOCOBALAMIN  Take 1,000 mcg by mouth daily.     vitamin C 1000 MG tablet  Take 1,000 mg by mouth daily.     Vitamin D 2000 UNITS tablet  Take 2,000 Units by mouth daily.     vitamin E 400 UNIT capsule  Take 400 Units by mouth daily.           Follow-up Information   Follow up with [facility], MD. Schedule an appointment as soon as possible for a visit in 1 week.   Specialty:  Cardiology   Contact information:   [address] [place]  [address] [phone]       Follow up with  Eilleen Kempf., MD In 1 day. (As needed)    Specialty:  Oncology   Contact information:   [address] [place] [address] [phone]       Signed: [name] 07/26/2013, 7:46 PM

## 2013-07-26 NOTE — Telephone Encounter (Signed)
Spoke with Rolanda Lundborg, patient's son regarding what tomorrow's appointment consists of , which is re-consultation with Dr. Pablo Ledger to discuss radiation to trachea region, and simulation for treatment if patient agrees.

## 2013-07-26 NOTE — Progress Notes (Signed)
Informed Dr. Doylene Canard that the nurse Val  From Dr. Regis Bill office called  To obtained order to be transported  tomorrow to see Dr. Velora Mediate.  Dr. Doylene Canard  instructed that pt will probably be d/c tomorrow and will be able to set up time to have tx done.  Karie Kirks, Therapist, sports.

## 2013-07-26 NOTE — Progress Notes (Signed)
One unit ob blood ordered to be transfused not given as pt H/H 9.1/26.9. From 7.8/24.0  Dr Doylene Canard made aware and instructed not to give 1 UPRBC.  Pt made aware.  MD in to see pt.  Karie Kirks, Therapist, sports.

## 2013-07-26 NOTE — Plan of Care (Signed)
Patient complains of nausea. Has vomited 2 times into trash can tonight (unable to measure). Patient refuses medication at this time. Will continue to monitor patient.

## 2013-07-27 ENCOUNTER — Other Ambulatory Visit: Payer: Medicare Other

## 2013-07-27 ENCOUNTER — Ambulatory Visit
Admission: RE | Admit: 2013-07-27 | Discharge: 2013-07-27 | Disposition: A | Discharge status: Home / Self Care | Payer: Medicare Other | Source: Ambulatory Visit | Attending: Radiation Oncology | Admitting: Radiation Oncology

## 2013-07-27 ENCOUNTER — Encounter: Payer: Self-pay | Admitting: Radiation Oncology

## 2013-07-27 ENCOUNTER — Ambulatory Visit: Payer: Medicare Other

## 2013-07-27 VITALS — BP 104/64 | HR 118 | Temp 97.3°F | Wt 131.6 lb

## 2013-07-27 DIAGNOSIS — C7951 Secondary malignant neoplasm of bone: Secondary | ICD-10-CM | POA: Insufficient documentation

## 2013-07-27 DIAGNOSIS — Z510 Encounter for antineoplastic radiation therapy: Secondary | ICD-10-CM | POA: Insufficient documentation

## 2013-07-27 DIAGNOSIS — R042 Hemoptysis: Secondary | ICD-10-CM | POA: Insufficient documentation

## 2013-07-27 DIAGNOSIS — R05 Cough: Secondary | ICD-10-CM | POA: Insufficient documentation

## 2013-07-27 DIAGNOSIS — C3490 Malignant neoplasm of unspecified part of unspecified bronchus or lung: Secondary | ICD-10-CM

## 2013-07-27 DIAGNOSIS — C3410 Malignant neoplasm of upper lobe, unspecified bronchus or lung: Secondary | ICD-10-CM | POA: Insufficient documentation

## 2013-07-27 DIAGNOSIS — R059 Cough, unspecified: Secondary | ICD-10-CM | POA: Insufficient documentation

## 2013-07-27 DIAGNOSIS — Z79899 Other long term (current) drug therapy: Secondary | ICD-10-CM | POA: Insufficient documentation

## 2013-07-27 DIAGNOSIS — J3801 Paralysis of vocal cords and larynx, unilateral: Secondary | ICD-10-CM | POA: Insufficient documentation

## 2013-07-27 DIAGNOSIS — C7952 Secondary malignant neoplasm of bone marrow: Secondary | ICD-10-CM

## 2013-07-27 DIAGNOSIS — J9859 Other diseases of mediastinum, not elsewhere classified: Secondary | ICD-10-CM | POA: Insufficient documentation

## 2013-07-27 LAB — TYPE AND SCREEN
ABO/RH(D): B POS
Antibody Screen: NEGATIVE
Unit division: 0

## 2013-07-27 NOTE — Progress Notes (Addendum)
Thoracic Location of Tumor / Histology:Recurrent non-small cell lung cancer  Patient presented with symptoms of acute hemoptysis and anemia d/t blood loss  Biopsies of (if applicable) revealedLleft lower lobe mass   Tobacco/Marijuana/Snuff/ETOH MBW:GYKZLD 1 ppd for 26 years.Quit in 2001.No alcohol or drug abuse.  Past/Anticipated interventions by cardiothoracic surgery, if JTT:SVXBLTJQ bronchoscopy performed on 07/23/13 revealed fungating mass eroding right tracheal wall with oozing of blood.                                                                                                            Left lower lobe lobectomy with lymph node dissection by Dr.Burney on 10/17/09.  Past/Anticipated interventions by medical oncology, if ZES:PQZRAQTMA last given on 07/20/13.Radiation not to be given less than one week of of administration.Zometa given monthly.  Signs/Symptoms  Weight changes, if any: 8 to 10 lbs over last 3 to 6 months per patient.  Respiratory complaints, if UQJ:FHLKTGYBW of breath on exertion  Hemoptysis, if any:Yes  Pain issues, if LSL:HTDS left hip and abdominal pain.  SAFETY ISSUES:  Prior radiation?Yes 30 Gy at 3 Gy per fraction x 10 fractions in February 2013.  Pacemaker/ICD?No  Possible current pregnancy?No  Is the patient on methotrexate? No  Current Complaints / other details:widowed, 2 children.Patient main concern is energy level and fatigue.Wants to go back to work.Discharged on 07/26/13 with blood loss, sinus tachycardia, weakness, left hip pain, dehydration , hemoptysis and recurrent NSCLCA.

## 2013-07-27 NOTE — Progress Notes (Signed)
Please see the Nurse Progress Note in the MD Initial Consult Encounter for this patient. 

## 2013-07-27 NOTE — Progress Notes (Signed)
[facility] Simulation and Treatment Planning Note   Name: Kaitlin Reed MRN: [id number]  Date: 07/27/2013  DOB: [birth year]  Status: outpatient    DIAGNOSIS: Recurrent lung cancer with hemoptysis    SIDE: right   CONSENT VERIFIED: yes   SET UP AND IMMOBILIZATION: Patient is setup supine with arms in a wing board.   NARRATIVE: The patient was brought to the [facility].  Identity was confirmed.  All relevant records and images related to the planned course of therapy were reviewed.  Then, the patient was positioned in a stable reproducible clinical set-up for radiation therapy.  CT images were obtained.  Skin markings were placed.  The CT images were loaded into the planning software where the target and avoidance structures were contoured.  The radiation prescription was entered and confirmed.   TREATMENT PLANNING NOTE:  Treatment planning then occurred. I have requested 3D simulation with [facility] of the spinal cord, total lungs and gross tumor volume. I have also requested mlcs and an isodose plan.   Special treatment procedure will be performed as [place] has been previously treated to this area.  A composite plan will be formulated and doses to critical structures analyzed. We discussed the rare but serious consequences that can occur with retreatment in this area.

## 2013-07-27 NOTE — Addendum Note (Signed)
Encounter addended by: Arlyss Repress, RN on: 07/27/2013  2:04 PM<BR>     Documentation filed: Charges VN

## 2013-07-27 NOTE — Progress Notes (Signed)
MD order for Discharge.  Instructions done with patient, no questions at this time.  Port de-accessed per [employer] team.  Tele box removed and CCMD notified.  Shellee Milo, RN

## 2013-07-27 NOTE — Progress Notes (Signed)
Department of Radiation Oncology  Phone:  [phone] Fax:        [phone]   Name: Kaitlin Reed MRN: [id number]  DOB: 1950/12/29  Date: 07/27/2013  Follow Up Visit Note  Diagnosis: Metastatic non-small cell lung cancer  Summary and Interval since last radiation: Concurrent chemoradiotherapy to a total dose of 60 gray completed 05/06/2010, radiation to the right neck and supraclavicular lymph nodes to a total dose of 30 gray completed 06/04/2011  Interval History: Kaitlin Reed reports for followup today. She was recently hospitalized with complaints of hemoptysis. She reports gross hemoptysis x2 with about half a couple blood. Her last episode it was while she was in the hospital which was dark red blood. She was scoped by pulmonary and found to have a mass or bruising through the trachea oozing blood and obstructing about a third of the tracheal lumen. She was also noted to have right vocal cord paralysis. Cautery was applied and she was discharged to followup with me for consideration of palliative radiation. This is the same area that is in between her previously radiated fields and we have evaluated last August. At that time she was asymptomatic and shows to proceed with systemic treatment rather than except the risks of retreatment. This is a reasonable goal approach. She has not had restaging studies recently but did have a CT of the chest on 07/03/2013 which showed an increase in the anterior mediastinal lymph node which was just slightly increased by a millimeter. The the right peritracheal node really was stable in size and a left upper lobe nodule was increased in size. Her osseous metastatic disease was stable. She is on a trial of an immunotherapy. It per the trial this drug has not been tested with radiation. Her last dose was on March 24. The clinical trial recommends a one-week washout prior to initiation of radiation and then one week after that before restarting the drug. She  reports fatigue. Her last hemoglobin was 9.1 on 07/26/13. She denies any headaches or bone pain.  She is still working.   Allergies:  Allergies  Allergen Reactions  . Codeine Nausea And Vomiting  . Motrin [Ibuprofen] Nausea And Vomiting  . Other Nausea Only    steroids  . Trazodone And Nefazodone Nausea And Vomiting  . Banana Hives  . Tape Swelling    Paper tape okay Cannot use elastic tape  . Delton See [Denosumab] Rash    Medications:  Current Outpatient Prescriptions  Medication Sig Dispense Refill  . Ascorbic Acid (VITAMIN C) 1000 MG tablet Take 1,000 mg by mouth daily.      . Bromfenac Sodium (PROLENSA) 0.07 % SOLN Place 1 drop into the left eye daily.       . calcium carbonate (OS-CAL) 600 MG TABS Take 600 mg by mouth 2 (two) times daily with a meal.      . Cholecalciferol (VITAMIN D) 2000 UNITS tablet Take 2,000 Units by mouth daily.      . ferrous sulfate 325 (65 FE) MG tablet Take 1 tablet (325 mg total) by mouth 2 (two) times daily with a meal.      . metoprolol tartrate (LOPRESSOR) 25 MG tablet Take 25 mg by mouth 2 (two) times daily.      . Multiple Vitamin (MULITIVITAMIN WITH MINERALS) TABS Take 1 tablet by mouth daily.       Marland Kitchen NIVOLUMAB IV Inject 3 mg/kg into the vein every 14 (fourteen) days. *iv infusion that runs for 85min*      .  potassium chloride (K-DUR,KLOR-CON) 10 MEQ tablet Take 1 tablet (10 mEq total) by mouth 2 (two) times daily.  60 tablet  3  . Selenium 200 MCG CAPS Take 200 mcg by mouth daily.       . temazepam (RESTORIL) 15 MG capsule Take 15 mg by mouth at bedtime as needed for sleep.      . vitamin B-12 (CYANOCOBALAMIN) 1000 MCG tablet Take 1,000 mcg by mouth daily.      . vitamin E 400 UNIT capsule Take 400 Units by mouth daily.      Marland Kitchen aspirin EC 81 MG tablet Take 81 mg by mouth daily.      . [DISCONTINUED] Alum & Mag Hydroxide-Simeth (MAGIC MOUTHWASH W/LIDOCAINE) SOLN Take 10 mLs by mouth 4 (four) times daily as needed.  500 mL  5   No current  facility-administered medications for this encounter.    Physical Exam:  Filed Vitals:   07/27/13 1059  BP: 104/64  Pulse: 118  Temp: 97.3 F (36.3 C)   she is pleasant female in no distress sitting comfortably examining table. She is alert and oriented x3. She has normal respiratory effort. She has 5 out of 5 strength in her bilateral upper and lower extremities.  IMPRESSION: Kaitlin Reed is a 63 y.o. female with progressive disease in the upper mediastinum.  PLAN:   Kaitlin Reed and I again discussed treatment of this upper right paratracheal lymph node. At this point it has become symptomatic in her hemoglobin is actually dropping from both frank hemoptysis and probably some slow oozing bleeders. We discussed simulation and treatment of this area. We again discussed the rare but serious side effects of retreatment. Ultimately she has become symptomatic from this area and will experience airway obstruction if we don't do something now. I scheduled her for simulation today and we will begin her treatment on Thursday. We'll plan 10-14 treatments as an outpatient. She will likely have some degree of esophagitis. I will make the research nurses aware that she is starting on Thursday and have discussed rescheduling the rest of her followup visits and scans with Dr. Julien Nordmann.   Thea Silversmith, MD

## 2013-07-29 ENCOUNTER — Ambulatory Visit
Admission: RE | Admit: 2013-07-29 | Discharge: 2013-07-29 | Disposition: A | Discharge status: Home / Self Care | Payer: Medicare Other | Source: Ambulatory Visit | Attending: Radiation Oncology | Admitting: Radiation Oncology

## 2013-07-29 ENCOUNTER — Telehealth: Payer: Self-pay

## 2013-07-29 DIAGNOSIS — C3490 Malignant neoplasm of unspecified part of unspecified bronchus or lung: Secondary | ICD-10-CM

## 2013-07-29 NOTE — Progress Notes (Signed)
  Radiation Oncology         [phone] ________________________________  Name: Kaitlin Reed MRN: [id number]  Date: 07/29/2013  DOB: 05/20/1950  Simulation Verification Note  Status: outpatient  NARRATIVE: The patient was brought to the treatment unit and placed in the planned treatment position. The clinical setup was verified. Then port films were obtained and uploaded to the radiation oncology medical record software.  The treatment beams were carefully compared against the planned radiation fields. The position location and shape of the radiation fields was reviewed. The targeted volume of tissue appears appropriately covered by the radiation beams. Organs at risk appear to be excluded as planned.  Based on my personal review, I approved the simulation verification. The patient's treatment will proceed as planned.  ------------------------------------------------  Thea Silversmith, MD

## 2013-07-29 NOTE — Telephone Encounter (Signed)
Adrena in med/onc informed that patient starts radiation today and would therefore need to cancel nivolimab on 08/03/13 and 08/17/13.I will inform patient of changes to schedule.

## 2013-07-30 ENCOUNTER — Telehealth: Payer: Self-pay | Admitting: *Deleted

## 2013-07-30 ENCOUNTER — Ambulatory Visit
Admission: RE | Admit: 2013-07-30 | Discharge: 2013-07-30 | Disposition: A | Discharge status: Home / Self Care | Payer: Medicare Other | Source: Ambulatory Visit | Attending: Radiation Oncology | Admitting: Radiation Oncology

## 2013-07-30 NOTE — Addendum Note (Signed)
Encounter addended by: Deirdre Evener, RN on: 07/30/2013  6:16 PM<BR>     Documentation filed: Charges VN

## 2013-07-30 NOTE — Telephone Encounter (Signed)
Spoke with Kaitlin Reed to inform her I cancelled the medical oncology appointments for 4/7 and 4/21. She is aware she will not receive chemotherapy while receiving radiation therapy.

## 2013-08-02 ENCOUNTER — Telehealth: Payer: Self-pay | Admitting: *Deleted

## 2013-08-02 ENCOUNTER — Ambulatory Visit
Admission: RE | Admit: 2013-08-02 | Discharge: 2013-08-02 | Disposition: A | Discharge status: Home / Self Care | Payer: Medicare Other | Source: Ambulatory Visit | Attending: Radiation Oncology | Admitting: Radiation Oncology

## 2013-08-02 NOTE — Telephone Encounter (Signed)
Spoke w/pt re: schedule tomorrow. Pt is aware of her ct chest at 7:30 am. She states she does not want to change her radiation treatment time of 5 pm. Pt requested calendar; printed calendar and at linac 3 for pt to pick up today. Pt aware. Per Nadean Corwin, RN's request, called Mr Myrtie Neither, pt's son and informed him of pt's radiation time tomorrow. He states he plans on bringing his mother in for her treatment and to see Dr Pablo Ledger following.

## 2013-08-03 ENCOUNTER — Ambulatory Visit: Payer: Medicare Other | Admitting: Physician Assistant

## 2013-08-03 ENCOUNTER — Ambulatory Visit
Admission: RE | Admit: 2013-08-03 | Discharge: 2013-08-03 | Disposition: A | Discharge status: Home / Self Care | Payer: Medicare Other | Source: Ambulatory Visit | Attending: Radiation Oncology | Admitting: Radiation Oncology

## 2013-08-03 ENCOUNTER — Ambulatory Visit

## 2013-08-03 ENCOUNTER — Other Ambulatory Visit

## 2013-08-03 ENCOUNTER — Ambulatory Visit (HOSPITAL_COMMUNITY)
Admission: RE | Admit: 2013-08-03 | Discharge: 2013-08-03 | Disposition: A | Discharge status: Home / Self Care | Payer: Medicare Other | Source: Ambulatory Visit | Attending: Internal Medicine | Admitting: Internal Medicine

## 2013-08-03 ENCOUNTER — Encounter: Payer: Self-pay | Admitting: Radiation Oncology

## 2013-08-03 VITALS — BP 109/73 | HR 109 | Temp 98.2°F | Resp 20 | Wt 130.8 lb

## 2013-08-03 DIAGNOSIS — C7952 Secondary malignant neoplasm of bone marrow: Secondary | ICD-10-CM

## 2013-08-03 DIAGNOSIS — R599 Enlarged lymph nodes, unspecified: Secondary | ICD-10-CM | POA: Insufficient documentation

## 2013-08-03 DIAGNOSIS — C3490 Malignant neoplasm of unspecified part of unspecified bronchus or lung: Secondary | ICD-10-CM

## 2013-08-03 DIAGNOSIS — E279 Disorder of adrenal gland, unspecified: Secondary | ICD-10-CM | POA: Insufficient documentation

## 2013-08-03 DIAGNOSIS — C7951 Secondary malignant neoplasm of bone: Secondary | ICD-10-CM | POA: Insufficient documentation

## 2013-08-03 DIAGNOSIS — I319 Disease of pericardium, unspecified: Secondary | ICD-10-CM | POA: Insufficient documentation

## 2013-08-03 DIAGNOSIS — Z9221 Personal history of antineoplastic chemotherapy: Secondary | ICD-10-CM | POA: Insufficient documentation

## 2013-08-03 DIAGNOSIS — R911 Solitary pulmonary nodule: Secondary | ICD-10-CM | POA: Insufficient documentation

## 2013-08-03 MED ORDER — IOHEXOL 300 MG/ML  SOLN
100.0000 mL | Freq: Once | INTRAMUSCULAR | Status: AC | PRN
  Administered 2013-08-03: 100 mL via INTRAVENOUS

## 2013-08-03 MED ORDER — RADIAPLEXRX EX GEL
Freq: Once | CUTANEOUS | Status: AC
  Administered 2013-08-03: 18:00:00 via TOPICAL

## 2013-08-03 NOTE — Progress Notes (Signed)
Weekly rad txs ret tx chest  4 completed , no skin changes, did cough up some brownish sputum on tissue,yesterday wasslight red on tissue, coughs up phelgm at time,as no sob no pain, is weak and fatigued, gave radidaplex to use after treatment daily and prn 5:23 PM

## 2013-08-03 NOTE — Telephone Encounter (Signed)
This Chest CT should have been cancelled?

## 2013-08-03 NOTE — Progress Notes (Signed)
Weekly Management Note Current Dose: 10  Gy  Projected Dose: 30 Gy   Narrative:  The patient presents for routine under treatment assessment.  CBCT/MVCT images/Port film x-rays were reviewed.  The chart was checked. Coughing up phlegm with brownish tint. No frank blood today. Some streaks yesterday. Son in town. No dysphagia  Physical Findings: Weight: 130 lb 12.8 oz (59.33 kg). Unchanged  Impression:  The patient is tolerating radiation.  Plan:  Continue treatment as planned. Continue RT. Instructed to head to ER if frank blood. Per pts wishes prognosis and results of CT scan from this AM not discussed.

## 2013-08-04 ENCOUNTER — Ambulatory Visit
Admission: RE | Admit: 2013-08-04 | Discharge: 2013-08-04 | Disposition: A | Discharge status: Home / Self Care | Payer: Medicare Other | Source: Ambulatory Visit | Attending: Radiation Oncology | Admitting: Radiation Oncology

## 2013-08-05 ENCOUNTER — Ambulatory Visit (HOSPITAL_COMMUNITY): Payer: Medicare Other

## 2013-08-05 ENCOUNTER — Ambulatory Visit
Admission: RE | Admit: 2013-08-05 | Discharge: 2013-08-05 | Disposition: A | Discharge status: Home / Self Care | Payer: Medicare Other | Source: Ambulatory Visit | Attending: Radiation Oncology | Admitting: Radiation Oncology

## 2013-08-06 ENCOUNTER — Ambulatory Visit
Admission: RE | Admit: 2013-08-06 | Discharge: 2013-08-06 | Disposition: A | Discharge status: Home / Self Care | Payer: Medicare Other | Source: Ambulatory Visit | Attending: Radiation Oncology | Admitting: Radiation Oncology

## 2013-08-09 ENCOUNTER — Ambulatory Visit
Admission: RE | Admit: 2013-08-09 | Discharge: 2013-08-09 | Disposition: A | Discharge status: Home / Self Care | Payer: Medicare Other | Source: Ambulatory Visit | Attending: Radiation Oncology | Admitting: Radiation Oncology

## 2013-08-10 ENCOUNTER — Ambulatory Visit
Admission: RE | Admit: 2013-08-10 | Discharge: 2013-08-10 | Disposition: A | Discharge status: Home / Self Care | Payer: Medicare Other | Source: Ambulatory Visit | Attending: Radiation Oncology | Admitting: Radiation Oncology

## 2013-08-10 ENCOUNTER — Ambulatory Visit: Payer: Medicare Other | Admitting: Radiation Oncology

## 2013-08-11 ENCOUNTER — Encounter: Payer: Self-pay | Admitting: Emergency Medicine

## 2013-08-11 ENCOUNTER — Ambulatory Visit: Payer: Medicare Other | Admitting: Radiation Oncology

## 2013-08-11 ENCOUNTER — Ambulatory Visit: Payer: Medicare Other | Admitting: Emergency Medicine

## 2013-08-11 ENCOUNTER — Other Ambulatory Visit (INDEPENDENT_AMBULATORY_CARE_PROVIDER_SITE_OTHER): Payer: Medicare Other

## 2013-08-11 ENCOUNTER — Ambulatory Visit
Admission: RE | Admit: 2013-08-11 | Discharge: 2013-08-11 | Disposition: A | Discharge status: Home / Self Care | Payer: Medicare Other | Source: Ambulatory Visit | Attending: Radiation Oncology | Admitting: Radiation Oncology

## 2013-08-11 ENCOUNTER — Encounter: Payer: Self-pay | Admitting: Radiation Oncology

## 2013-08-11 VITALS — BP 110/72 | HR 101 | Temp 98.2°F | Resp 20 | Wt 131.1 lb

## 2013-08-11 VITALS — BP 122/76 | HR 105 | Ht 66.0 in | Wt 130.0 lb

## 2013-08-11 DIAGNOSIS — C3410 Malignant neoplasm of upper lobe, unspecified bronchus or lung: Secondary | ICD-10-CM

## 2013-08-11 DIAGNOSIS — R042 Hemoptysis: Secondary | ICD-10-CM

## 2013-08-11 DIAGNOSIS — J439 Emphysema, unspecified: Secondary | ICD-10-CM

## 2013-08-11 LAB — CBC WITH DIFFERENTIAL/PLATELET
BASOS ABS: 0 10*3/uL (ref 0.0–0.1)
BASOS PCT: 0.3 % (ref 0.0–3.0)
Eosinophils Absolute: 0.1 10*3/uL (ref 0.0–0.7)
Eosinophils Relative: 1.3 % (ref 0.0–5.0)
HCT: 30 % — ABNORMAL LOW (ref 36.0–46.0)
HEMOGLOBIN: 9.9 g/dL — AB (ref 12.0–15.0)
LYMPHS PCT: 12.4 % (ref 12.0–46.0)
Lymphs Abs: 0.8 10*3/uL (ref 0.7–4.0)
MCHC: 32.9 g/dL (ref 30.0–36.0)
MCV: 85 fl (ref 78.0–100.0)
MONOS PCT: 8.2 % (ref 3.0–12.0)
Monocytes Absolute: 0.5 10*3/uL (ref 0.1–1.0)
NEUTROS PCT: 77.8 % — AB (ref 43.0–77.0)
Neutro Abs: 5.2 10*3/uL (ref 1.4–7.7)
Platelets: 424 10*3/uL — ABNORMAL HIGH (ref 150.0–400.0)
RBC: 3.53 Mil/uL — AB (ref 3.87–5.11)
RDW: 16.1 % — ABNORMAL HIGH (ref 11.5–14.6)
WBC: 6.7 10*3/uL (ref 4.5–10.5)

## 2013-08-11 MED ORDER — ALBUTEROL SULFATE HFA 108 (90 BASE) MCG/ACT IN AERS: 2.0000 | INHALATION_SPRAY | Freq: Four times a day (QID) | RESPIRATORY_TRACT | Status: AC | PRN

## 2013-08-11 NOTE — Assessment & Plan Note (Signed)
-   trial albuterol prn

## 2013-08-11 NOTE — Patient Instructions (Signed)
We will check blood work today If you begin to start coughing up blood again then please call our office to notify us Follow with Dr Lamonte Sakai in 3 months or sooner if you have any problems.

## 2013-08-11 NOTE — Progress Notes (Addendum)
Pt c/o fatigue, denies pain, cough, SOB, loss of appetite. She had lab drawn today, was prescribed Proair. She has not picked up the inhaler from drugstore.

## 2013-08-11 NOTE — Assessment & Plan Note (Addendum)
CT scan reviewed. XRT seems to have been effective, but if bleeding restarts  I believe she needs repeat FOB to assess for endobronchial lesion. If present then she may benefit from laser / APC / cryotherapy.  - repeat CBC today at her request

## 2013-08-11 NOTE — Addendum Note (Signed)
Encounter addended by: Andria Rhein, RN on: 08/11/2013  6:30 PM<BR>     Documentation filed: Inpatient Document Flowsheet

## 2013-08-11 NOTE — Progress Notes (Signed)
Subjective:    Patient ID: Kaitlin Reed, female    DOB: Jul 10, 1950, 63 y.o.   MRN: [id number]  HPI 63 yo woman, former smoker, hx adenocarcinoma lung CA s/p neoadjuvant chemo and then LLL lobectomy 6/11, then immunotherapy for recurrence most recent starting 11/14. She has been having episodic hemoptysis and underwent FOB in march that showed erosion of CA into her R tracheal wall.  XRT was started and has been effective in stopping the hemoptysis. She hasn't had PFT recently. Feels her breathing is stable, but very weak.    Review of Systems  Constitutional: Positive for appetite change and unexpected weight change. Negative for fever.  HENT: Negative for congestion, dental problem, ear pain, nosebleeds, postnasal drip, rhinorrhea, sinus pressure, sneezing, sore throat and trouble swallowing.   Eyes: Negative for redness and itching.  Respiratory: Positive for chest tightness. Negative for cough, shortness of breath and wheezing.   Cardiovascular: Positive for palpitations. Negative for leg swelling.  Gastrointestinal: Negative for nausea and vomiting.  Genitourinary: Negative for dysuria.  Musculoskeletal: Negative for joint swelling.  Skin: Negative for rash.  Neurological: Negative for headaches.  Hematological: Does not bruise/bleed easily.  Psychiatric/Behavioral: Negative for dysphoric mood. The patient is not nervous/anxious.     Past Medical History  Diagnosis Date  . Heart murmur   . 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 Anemia   . History of blood transfusion 1970's    "related to tubal pregnancy"   . lung ca dx'd 06/2009    chemo comp 07/2010; xrt comp 06/2010     Family History  Problem Relation Age of Onset  . Hypertension Father   . Cancer Sister     breast     History   Social History  . Marital Status: Widowed    Spouse Name: N/A    Number of Children: N/A  . Years of Education: N/A   Occupational History  . Not on file.   Social History Main Topics  .  Smoking status: Former Smoker -- 0.50 packs/day for 12 years    Types: Cigarettes    Quit date: 04/29/1982  . Smokeless tobacco: Never Used     Comment: quit smoking cigarettes in the 1970's  . Alcohol Use: No  . Drug Use: No  . Sexual Activity: No   Other Topics Concern  . Not on file   Social History Narrative  . No narrative on file     Allergies  Allergen Reactions  . Codeine Nausea And Vomiting  . Motrin [Ibuprofen] Nausea And Vomiting  . Other Nausea Only    steroids  . Trazodone And Nefazodone Nausea And Vomiting  . Banana Hives  . Tape Swelling    Paper tape okay Cannot use elastic tape  . Delton See [Denosumab] Rash     Outpatient Prescriptions Prior to Visit  Medication Sig Dispense Refill  . Ascorbic Acid (VITAMIN C) 1000 MG tablet Take 1,000 mg by mouth daily.      Marland Kitchen aspirin EC 81 MG tablet Take 81 mg by mouth daily.      . calcium carbonate (OS-CAL) 600 MG TABS Take 600 mg by mouth 2 (two) times daily with a meal.      . Cholecalciferol (VITAMIN D) 2000 UNITS tablet Take 2,000 Units by mouth daily.      . ferrous sulfate 325 (65 FE) MG tablet Take 1 tablet (325 mg total) by mouth 2 (two) times daily with a meal.      .  hyaluronate sodium (RADIAPLEXRX) GEL Apply 1 application topically 2 (two) times daily.      . metoprolol tartrate (LOPRESSOR) 25 MG tablet Take 25 mg by mouth 2 (two) times daily.      . Multiple Vitamin (MULITIVITAMIN WITH MINERALS) TABS Take 1 tablet by mouth daily.       . potassium chloride (K-DUR,KLOR-CON) 10 MEQ tablet Take 1 tablet (10 mEq total) by mouth 2 (two) times daily.  60 tablet  3  . Selenium 200 MCG CAPS Take 200 mcg by mouth daily.       . temazepam (RESTORIL) 15 MG capsule Take 15 mg by mouth at bedtime as needed for sleep.      . vitamin B-12 (CYANOCOBALAMIN) 1000 MCG tablet Take 1,000 mcg by mouth daily.      . vitamin E 400 UNIT capsule Take 400 Units by mouth daily.      . Bromfenac Sodium (PROLENSA) 0.07 % SOLN Place 1 drop  into the left eye daily.       Marland Kitchen NIVOLUMAB IV Inject 3 mg/kg into the vein every 14 (fourteen) days. *iv infusion that runs for 43min*       No facility-administered medications prior to visit.         Objective:   Physical Exam Filed Vitals:   08/11/13 1038  BP: 122/76  Pulse: 105  Height: 5\' 6"  (1.676 m)  Weight: 130 lb (58.968 kg)  SpO2: 99%   Gen: Pleasant, well-nourished, in no distress,  normal affect  ENT: No lesions,  mouth clear,  oropharynx clear, no postnasal drip  Neck: No JVD, no TMG, no carotid bruits  Lungs: No use of accessory muscles, no dullness to percussion, clear without rales or rhonchi  Cardiovascular: RRR, heart sounds normal, no murmur or gallops, no peripheral edema  Musculoskeletal: No deformities, no cyanosis or clubbing  Neuro: alert, non focal  Skin: Warm, no lesions or rashes    07/13/13 --  COMPARISON: CT chest, CT chest 05/11/2013.  FINDINGS:  No pulmonary embolus is identified. Moderate pericardial effusion is  unchanged. Trace amount of pleural fluid on the left is noted. No  right pleural effusion. Anterior mediastinal lymph node which had  measured 2.1 x 1.8 cm today measures 2.1 x 1.9 cm on image 29. Large  right paratracheal nodal mass measures 3.9 x 3.0 cm on image 27  compared to 3.8 x 2.9 cm.  Lungs again demonstrate extensive centrilobular emphysematous  disease. The patient is status post left lower lobectomy with  associated architectural distortion and volume loss, unchanged.  Nodule in the anterior aspect of the left upper lobe which had  measured 1.5 x 1.0 cm today measures 2.0 x 1.5 cm on image 49.  Visualized upper abdomen is unremarkable. Bones again demonstrate  multiple sclerotic lesions consistent with metastatic is unchanged.  The appearance is not markedly changed.  Review of the MIP images confirms the above findings.  IMPRESSION:  Negative for pulmonary embolus.  Slight increase in mediastinal  lymphadenopathy.  Increased size of a left upper lobe pulmonary nodule.  No marked change in a moderate pericardial effusion.  No change in osseous metastatic disease.     Assessment & Plan:  Cough with hemoptysis CT scan reviewed. XRT seems to have been effective, but if bleeding restarts  I believe she needs repeat FOB to assess for endobronchial lesion. If present then she may benefit from laser / APC / cryotherapy.  - repeat CBC today at her request  Emphysema lung - trial albuterol prn

## 2013-08-11 NOTE — Progress Notes (Signed)
Weekly Management Note Current Dose: 25  Gy  Projected Dose: 30 Gy   Narrative:  The patient presents for routine under treatment assessment.  CBCT/MVCT images/Port film x-rays were reviewed.  The chart was checked. No hemoptysis. Discussed dx with her son and sister. No dysphagia  Physical Findings: Weight: 131 lb 1.6 oz (59.467 kg). Unchanged  Impression:  The patient is tolerating radiation.  Plan:  Continue treatment as planned. Inbox message sent to Dr. Julien Nordmann and Research RN regarding restarting treatment.

## 2013-08-12 ENCOUNTER — Ambulatory Visit
Admission: RE | Admit: 2013-08-12 | Discharge: 2013-08-12 | Disposition: A | Discharge status: Home / Self Care | Payer: Medicare Other | Source: Ambulatory Visit | Attending: Radiation Oncology | Admitting: Radiation Oncology

## 2013-08-12 ENCOUNTER — Ambulatory Visit: Payer: Medicare Other | Admitting: Radiation Oncology

## 2013-08-12 ENCOUNTER — Telehealth: Payer: Self-pay | Admitting: Emergency Medicine

## 2013-08-12 NOTE — Telephone Encounter (Signed)
Lab- Since last check 2 weeks ago, Hemoglobin is still low, but has improved significantly from 7.8 to 9.9.  Hematocrit has improved from 24 to 30  Platelet count has increased.

## 2013-08-12 NOTE — Telephone Encounter (Signed)
Spoke with pt who is requesting lab results from yesterday. RB is on vacation.  CY could you please review her labs and advise, thanks!!

## 2013-08-12 NOTE — Telephone Encounter (Signed)
Advised pt per CY of lab results.  Pt verbalized understanding and had no further questions. Nothing further is needed

## 2013-08-13 ENCOUNTER — Encounter: Payer: Self-pay | Admitting: Radiation Oncology

## 2013-08-13 ENCOUNTER — Other Ambulatory Visit: Payer: Self-pay | Admitting: *Deleted

## 2013-08-13 ENCOUNTER — Telehealth: Payer: Self-pay | Admitting: Internal Medicine

## 2013-08-13 ENCOUNTER — Ambulatory Visit
Admission: RE | Admit: 2013-08-13 | Discharge: 2013-08-13 | Disposition: A | Discharge status: Home / Self Care | Payer: Medicare Other | Source: Ambulatory Visit | Attending: Radiation Oncology | Admitting: Radiation Oncology

## 2013-08-13 DIAGNOSIS — C3490 Malignant neoplasm of unspecified part of unspecified bronchus or lung: Secondary | ICD-10-CM

## 2013-08-13 NOTE — Telephone Encounter (Signed)
Pt got her appt schedule for 08/24/2013 when she came in for her rad appt on 08/13/2013. Sent michelle a staff message to add the chemo appt.

## 2013-08-16 ENCOUNTER — Ambulatory Visit: Payer: Medicare Other

## 2013-08-16 ENCOUNTER — Telehealth: Payer: Self-pay | Admitting: Internal Medicine

## 2013-08-16 ENCOUNTER — Telehealth: Payer: Self-pay | Admitting: *Deleted

## 2013-08-16 NOTE — Telephone Encounter (Signed)
Per staff message and POF I have scheduled appts.  JMW  

## 2013-08-16 NOTE — Telephone Encounter (Signed)
S/w the pt and she is aware that their is a tx added on to her schedule on 08/24/2013.

## 2013-08-17 ENCOUNTER — Ambulatory Visit: Payer: Medicare Other

## 2013-08-17 ENCOUNTER — Telehealth: Payer: Self-pay | Admitting: Internal Medicine

## 2013-08-17 ENCOUNTER — Ambulatory Visit

## 2013-08-17 ENCOUNTER — Other Ambulatory Visit

## 2013-08-17 NOTE — Telephone Encounter (Signed)
returned pt call and r/s per pt request...pt awrae of new time of appt

## 2013-08-23 NOTE — Progress Notes (Signed)
  Radiation Oncology         (336) [phone] ________________________________  Name: Kaitlin Reed MRN: [id number]  Date: 08/13/2013  DOB: 08/17/1950  End of Treatment Note  Diagnosis:   Recurrent (Stage IV) NSCLC      Indication for treatment:  palliative       Radiation treatment dates:  07/29/2013-08/13/2013  Site/dose:   Right paratracheal lymph nodes  Beams/energy:   3 field with a DCA and 6, 10 and 15 MV photons  Narrative: The patient tolerated radiation treatment relatively well.   She had no hemoptysis during treatment and minimal dysphagia.   Plan: The patient has completed radiation treatment. The patient will return to radiation oncology clinic for routine followup in one month. I advised them to call or return sooner if they have any questions or concerns related to their recovery or treatment.  ------------------------------------------------  Thea Silversmith, MD

## 2013-08-24 ENCOUNTER — Ambulatory Visit (HOSPITAL_BASED_OUTPATIENT_CLINIC_OR_DEPARTMENT_OTHER): Payer: Medicare Other | Admitting: Physician Assistant

## 2013-08-24 ENCOUNTER — Ambulatory Visit: Payer: Medicare Other | Admitting: Physician Assistant

## 2013-08-24 ENCOUNTER — Telehealth: Payer: Self-pay | Admitting: Internal Medicine

## 2013-08-24 ENCOUNTER — Ambulatory Visit (HOSPITAL_BASED_OUTPATIENT_CLINIC_OR_DEPARTMENT_OTHER): Payer: Medicare Other

## 2013-08-24 ENCOUNTER — Other Ambulatory Visit: Payer: Medicare Other

## 2013-08-24 ENCOUNTER — Encounter: Payer: Self-pay | Admitting: Physician Assistant

## 2013-08-24 ENCOUNTER — Other Ambulatory Visit (HOSPITAL_BASED_OUTPATIENT_CLINIC_OR_DEPARTMENT_OTHER): Payer: Medicare Other

## 2013-08-24 ENCOUNTER — Encounter: Payer: Self-pay | Admitting: *Deleted

## 2013-08-24 ENCOUNTER — Other Ambulatory Visit: Payer: Self-pay | Admitting: *Deleted

## 2013-08-24 ENCOUNTER — Ambulatory Visit: Payer: Medicare Other

## 2013-08-24 VITALS — BP 95/43 | HR 109 | Temp 97.5°F | Resp 18 | Ht 66.0 in | Wt 126.2 lb

## 2013-08-24 DIAGNOSIS — C3430 Malignant neoplasm of lower lobe, unspecified bronchus or lung: Secondary | ICD-10-CM

## 2013-08-24 DIAGNOSIS — C7952 Secondary malignant neoplasm of bone marrow: Secondary | ICD-10-CM

## 2013-08-24 DIAGNOSIS — C3490 Malignant neoplasm of unspecified part of unspecified bronchus or lung: Secondary | ICD-10-CM

## 2013-08-24 DIAGNOSIS — C7951 Secondary malignant neoplasm of bone: Secondary | ICD-10-CM

## 2013-08-24 DIAGNOSIS — C801 Malignant (primary) neoplasm, unspecified: Secondary | ICD-10-CM

## 2013-08-24 DIAGNOSIS — Z5112 Encounter for antineoplastic immunotherapy: Secondary | ICD-10-CM

## 2013-08-24 DIAGNOSIS — C770 Secondary and unspecified malignant neoplasm of lymph nodes of head, face and neck: Secondary | ICD-10-CM

## 2013-08-24 LAB — CBC WITH DIFFERENTIAL/PLATELET
BASO%: 0.2 % (ref 0.0–2.0)
BASOS ABS: 0 10*3/uL (ref 0.0–0.1)
EOS%: 1.1 % (ref 0.0–7.0)
Eosinophils Absolute: 0.1 10*3/uL (ref 0.0–0.5)
HEMATOCRIT: 31.2 % — AB (ref 34.8–46.6)
HEMOGLOBIN: 9.9 g/dL — AB (ref 11.6–15.9)
LYMPH%: 13.1 % — ABNORMAL LOW (ref 14.0–49.7)
MCH: 26.8 pg (ref 25.1–34.0)
MCHC: 31.7 g/dL (ref 31.5–36.0)
MCV: 84.3 fL (ref 79.5–101.0)
MONO#: 0.6 10*3/uL (ref 0.1–0.9)
MONO%: 9.1 % (ref 0.0–14.0)
NEUT#: 5.1 10*3/uL (ref 1.5–6.5)
NEUT%: 76.5 % (ref 38.4–76.8)
Platelets: 355 10*3/uL (ref 145–400)
RBC: 3.7 10*6/uL (ref 3.70–5.45)
RDW: 15.1 % — AB (ref 11.2–14.5)
WBC: 6.6 10*3/uL (ref 3.9–10.3)
lymph#: 0.9 10*3/uL (ref 0.9–3.3)

## 2013-08-24 LAB — COMPREHENSIVE METABOLIC PANEL (CC13)
ALBUMIN: 3.6 g/dL (ref 3.5–5.0)
ALT: <6 U/L (ref 0–55)
ANION GAP: 13 meq/L — AB (ref 3–11)
AST: 27 U/L (ref 5–34)
Alkaline Phosphatase: 341 U/L — ABNORMAL HIGH (ref 40–150)
BUN: 23.3 mg/dL (ref 7.0–26.0)
CALCIUM: 10.1 mg/dL (ref 8.4–10.4)
CHLORIDE: 106 meq/L (ref 98–109)
CO2: 23 mEq/L (ref 22–29)
Creatinine: 1.1 mg/dL (ref 0.6–1.1)
Glucose: 126 mg/dl (ref 70–140)
POTASSIUM: 4.1 meq/L (ref 3.5–5.1)
Sodium: 141 mEq/L (ref 136–145)
Total Bilirubin: 0.26 mg/dL (ref 0.20–1.20)
Total Protein: 8.1 g/dL (ref 6.4–8.3)

## 2013-08-24 LAB — MAGNESIUM (CC13): MAGNESIUM: 2 mg/dL (ref 1.5–2.5)

## 2013-08-24 LAB — LACTATE DEHYDROGENASE (CC13): LDH: 543 U/L — ABNORMAL HIGH (ref 125–245)

## 2013-08-24 LAB — PHOSPHORUS: PHOSPHORUS: 3.6 mg/dL (ref 2.3–4.6)

## 2013-08-24 MED ORDER — SODIUM CHLORIDE 0.9 % IJ SOLN
10.0000 mL | INTRAMUSCULAR | Status: DC | PRN
  Administered 2013-08-24: 10 mL
  Filled 2013-08-24: qty 10

## 2013-08-24 MED ORDER — SODIUM CHLORIDE 0.9 % IV SOLN
3.0000 mg/kg | Freq: Once | INTRAVENOUS | Status: AC
  Administered 2013-08-24: 172 mg via INTRAVENOUS
  Filled 2013-08-24: qty 17.2

## 2013-08-24 MED ORDER — ZOLEDRONIC ACID 4 MG/100ML IV SOLN
4.0000 mg | Freq: Once | INTRAVENOUS | Status: AC
  Administered 2013-08-24: 4 mg via INTRAVENOUS
  Filled 2013-08-24: qty 100

## 2013-08-24 MED ORDER — HEPARIN SOD (PORK) LOCK FLUSH 100 UNIT/ML IV SOLN
500.0000 [IU] | Freq: Once | INTRAVENOUS | Status: AC | PRN
  Administered 2013-08-24: 500 [IU]
  Filled 2013-08-24: qty 5

## 2013-08-24 MED ORDER — DRONABINOL 2.5 MG PO CAPS: 2.5000 mg | ORAL_CAPSULE | Freq: Two times a day (BID) | ORAL | Status: DC

## 2013-08-24 MED ORDER — SODIUM CHLORIDE 0.9 % IV SOLN
Freq: Once | INTRAVENOUS | Status: AC
  Administered 2013-08-24: 17:00:00 via INTRAVENOUS

## 2013-08-24 NOTE — Patient Instructions (Signed)
Take the Marinol as prescribed to stimulate her appetite Followup in 2 weeks

## 2013-08-24 NOTE — Progress Notes (Unsigned)
08/24/13 [id number] Cycle 5 Kaitlin Reed is in the cancer center today for lab work, physical exam, and treatment with nivolumab. This treatment has been delayed since she required radition therapy to a lesion which was causing hemoptysis. She is 11 days out from completing RT. Reviewed the new protocol consent, version 10.1 with her All questions were answered. She then signed and dated the consent.   Physical exam by Rollene Rotunda, PA.  [name] Age main concern was weight loss. She also continues to experience fatigue but she continues to work.   Dr. Julien Nordmann discussed the result of her CT scan done on 08/03/13. She had a mixed response which did constitute a progression. He discussed continuing treatment with her and she was agreeable.  Communication with the study medical monitor last week allows her continue past progression as long as she met the criteria, in section 4.3.4. Per Dr. Julien Nordmann, she meets the criteria. She signed and dated the consent to continue treatment. She was given a copy off both signed consents.

## 2013-08-24 NOTE — Progress Notes (Addendum)
[place] [phone]   [phone]  SHARED VISIT PROGRESS NOTE  Kaitlin Riddle, MD [place] [id number]  DIAGNOSIS: Recurrent non-small cell lung cancer initially diagnosed as stage IIA (T1 N1 MX) adenocarcinoma in April 2011.   PRIOR THERAPY:  1. Status post 3 cycles of neoadjuvant chemotherapy with carboplatin and Alimta. Last dose was given July 23, 2009. 2. Status post left lower lobectomy with lymph node dissection under the care of Dr. Arlyce Dice on October 17, 2009. 3. Status post concurrent chemoradiation with weekly carboplatin and paclitaxel for disease recurrence. Last dose was given May 07, 2010. 4. Status post 3 cycles of consolidation chemotherapy with carboplatin and Alimta. Last dose was given August 13, 2010. 5. Status post palliative radiotherapy to the right neck area. 6. Systemic chemotherapy with single agent Taxotere 75 mg/m2 every 3 weeks,, status post 6 cycles, last dose was given on 09/03/2011. 7. Systemic chemotherapy with carboplatin for an AUC of 5 and Alimta at 500 mg per meter square given every 3 weeks, status post 6 cycles. 8. Maintenance chemotherapy with single agent Alimta 500 mg/M2 every 3 weeks, status post 3 cycles discontinued today secondary to disease progression.  9. Systemic chemotherapy with single agent gemcitabine 1000 mg/M2 on days 1 and 8 every 3 weeks, status post 3 cycles discontinued today secondary to disease progression. First cycle on 03/15/2013.   CURRENT THERAPY:   1)  Immunotherapy according to the BMS clinical trial with Nivolumab. Status post 4 cycles 2) Zometa 4 mg IV every month. First dose given 05/17/2013  CHEMOTHERAPY INTENT: Palliative  CURRENT # OF CHEMOTHERAPY CYCLES: 4  CURRENT ANTIEMETICS: Zofran, dexamethasone and Compazine  CURRENT SMOKING STATUS: Former smoker  ORAL CHEMOTHERAPY AND CONSENT: None  CURRENT BISPHOSPHONATES USE: None  PAIN  MANAGEMENT: None  NARCOTICS INDUCED CONSTIPATION: None  LIVING WILL AND CODE STATUS: Full code initially but no longer term resuscitation.    INTERVAL HISTORY: Kaitlin Reed 63 y.o. female returns to the clinic today for followup visit. She is status post 4 cycles of  immunotherapy with Nivolumab according to the BMS 153 clinical trial. She was admitted from 07/22/1998 1520 07/26/2013 for acute hemoptysis. During the admission she had a bronchoscopy that revealed tracheal involvement of the tumor. She was treated with radiation therapy from 07/29/2013 through 08/13/2013. She reports she's had no further episodes of hemoptysis. She does complain of significant fatigue. Her weight is down a little over 10 pounds since 07/20/2013 despite eating 3 very good meals throughout the day as well as drinking to boost nutritional supplement drinks daily. She did have restaging CT scans per protocol performed and presents to discuss the results.  She denies shortness of breath and she's had no diarrhea or skin rash.  She denies any numbness or tingling.  She denied having any fever or chills. She has no nausea or vomiting. The patient denied having any significant chest pain, shortness of breath, or other significant cough. She denied night sweats.   MEDICAL HISTORY: Past Medical History  Diagnosis Date  . Heart murmur   . 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 Anemia   . History of blood transfusion 1970's    "related to tubal pregnancy"   . lung ca dx'd 06/2009    chemo comp 07/2010; xrt comp 06/2010    ALLERGIES:  is allergic to codeine; motrin; other; trazodone and nefazodone; banana; tape; and xgeva.  MEDICATIONS:  Current Outpatient Prescriptions  Medication  Sig Dispense Refill  . albuterol (PROAIR HFA) 108 (90 BASE) MCG/ACT inhaler Inhale 2 puffs into the lungs every 6 (six) hours as needed for wheezing or shortness of breath.  1 Inhaler  6  . Ascorbic Acid (VITAMIN C) 1000 MG tablet Take 1,000 mg by mouth  daily.      . calcium carbonate (OS-CAL) 600 MG TABS Take 600 mg by mouth 2 (two) times daily with a meal.      . Cholecalciferol (VITAMIN D) 2000 UNITS tablet Take 2,000 Units by mouth daily.      . ferrous sulfate 325 (65 FE) MG tablet Take 1 tablet (325 mg total) by mouth 2 (two) times daily with a meal.      . metoprolol tartrate (LOPRESSOR) 25 MG tablet Take 25 mg by mouth 2 (two) times daily.      . Multiple Vitamin (MULITIVITAMIN WITH MINERALS) TABS Take 1 tablet by mouth daily.       Marland Kitchen NIVOLUMAB IV Inject 3 mg/kg into the vein every 14 (fourteen) days. *iv infusion that runs for 44min*      . potassium chloride (K-DUR,KLOR-CON) 10 MEQ tablet Take 1 tablet (10 mEq total) by mouth 2 (two) times daily.  60 tablet  3  . temazepam (RESTORIL) 15 MG capsule Take 15 mg by mouth at bedtime as needed for sleep.      . vitamin B-12 (CYANOCOBALAMIN) 1000 MCG tablet Take 1,000 mcg by mouth daily.      . vitamin E 400 UNIT capsule Take 400 Units by mouth daily.      Marland Kitchen aspirin EC 81 MG tablet Take 81 mg by mouth daily.      . Bromfenac Sodium (PROLENSA) 0.07 % SOLN Place 1 drop into the left eye daily.       Marland Kitchen dronabinol (MARINOL) 2.5 MG capsule Take 1 capsule (2.5 mg total) by mouth 2 (two) times daily before a meal.  60 capsule  0  . hyaluronate sodium (RADIAPLEXRX) GEL Apply 1 application topically 2 (two) times daily.      . Selenium 200 MCG CAPS Take 200 mcg by mouth daily.       . [DISCONTINUED] Alum & Mag Hydroxide-Simeth (MAGIC MOUTHWASH W/LIDOCAINE) SOLN Take 10 mLs by mouth 4 (four) times daily as needed.  500 mL  5   No current facility-administered medications for this visit.    SURGICAL HISTORY:  Past Surgical History  Procedure Laterality Date  . Lung removal, partial Left 10/17/09    pt states they removed small piece of lung due to cancer  . 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 Insertion central venous access device w/ subcutaneous port Right 05/2013    "PowerPort"  .  Video bronchoscopy Bilateral 07/23/2013    Procedure: VIDEO BRONCHOSCOPY WITHOUT FLUORO;  Surgeon: Doree Fudge, MD;  Location: [facility];  Service: Cardiopulmonary;  Laterality: Bilateral;    REVIEW OF SYSTEMS:  Constitutional: positive for fatigue and weight loss Eyes: negative Ears, nose, mouth, throat, and face: negative Respiratory: positive for cough Cardiovascular: negative Gastrointestinal: negative Genitourinary:negative Integument/breast: negative Hematologic/lymphatic: negative Musculoskeletal:positive for arthralgias, myalgias and Left hip and low back pain Neurological: negative Behavioral/Psych: negative Endocrine: negative Allergic/Immunologic: negative   PHYSICAL EXAMINATION: General appearance: alert, cooperative and no distress Head: Normocephalic, without obvious abnormality, atraumatic Neck: no adenopathy Lymph nodes: Cervical, supraclavicular, and axillary nodes normal. Resp: clear to auscultation bilaterally Cardio: regular rate and rhythm, S1, S2 normal, no murmur, click, rub  or gallop GI: soft, non-tender; bowel sounds normal; no masses,  no organomegaly Extremities: extremities normal, atraumatic, no cyanosis or edema Neurologic: Alert and oriented X 3, normal strength and tone. Normal symmetric reflexes. Normal coordination and gait Right anterior chest Port-A-Cath, well-healed, no evidence of infection or inflammation, non-tender  ECOG PERFORMANCE STATUS: 1 - Symptomatic but completely ambulatory  Blood pressure 95/43, pulse 109, temperature 97.5 F (36.4 C), resp. rate 18, height 5\' 6"  (1.676 m), weight 126 lb 3.2 oz (57.244 kg), SpO2 98.00%.  LABORATORY DATA: Lab Results  Component Value Date   WBC 6.6 08/24/2013   HGB 9.9* 08/24/2013   HCT 31.2* 08/24/2013   MCV 84.3 08/24/2013   PLT 355 08/24/2013      Chemistry      Component Value Date/Time   NA 141 08/24/2013 1505   NA 136* 07/24/2013 0500   NA 138 11/06/2011 1121   K 4.1  08/24/2013 1505   K 4.6 07/24/2013 0500   K 4.3 11/06/2011 1121   CL 98 07/24/2013 0500   CL 105 10/07/2012 1350   CL 98 11/06/2011 1121   CO2 23 08/24/2013 1505   CO2 20 07/24/2013 0500   CO2 30 11/06/2011 1121   BUN 23.3 08/24/2013 1505   BUN 9 07/24/2013 0500   BUN 15 11/06/2011 1121   CREATININE 1.1 08/24/2013 1505   CREATININE 0.78 07/24/2013 0500   CREATININE 0.9 11/06/2011 1121      Component Value Date/Time   CALCIUM 10.1 08/24/2013 1505   CALCIUM 8.8 07/24/2013 0500   CALCIUM 9.0 11/06/2011 1121   ALKPHOS 341* 08/24/2013 1505   ALKPHOS 238* 04/26/2013 1639   ALKPHOS 110* 11/06/2011 1121   AST 27 08/24/2013 1505   AST 16 04/26/2013 1639   AST 36 11/06/2011 1121   ALT <6 08/24/2013 1505   ALT <5 04/26/2013 1639   ALT 23 11/06/2011 1121   BILITOT 0.26 08/24/2013 1505   BILITOT 0.2* 04/26/2013 1639   BILITOT 0.50 11/06/2011 1121       RADIOGRAPHIC STUDIES: Ct Chest W Contrast  08/05/2013   ADDENDUM REPORT: 08/05/2013 15:19  ADDENDUM: Recist measurements (target lesions - all greatest diameter):  Nodule in the medial aspect of the left upper lobe 1.6 cm on image 30.  Prevascular lymph node in the anterior mediastinum 2.0 cm on image 16.  Middle mediastinal nodal mass in the right paratracheal region 4.5 cm on image 18.  Right adrenal nodule 3.7 cm on image 56.  Non target lesions:  Pericardial fluid present, slightly decreased.  T11 vertebral lesion 2.6 cm on image 44.  Diffuse involvement of the left iliac crest, not measurable, stable.  Numerous sclerotic lesions including diffuse involvement of the left sacrum, not measurable, stable.   Electronically Signed   By: Rolm Baptise M.D.   On: 08/05/2013 15:19   08/05/2013   CLINICAL DATA:  Lung cancer, restaging.  Ongoing chemotherapy.  EXAM: CT CHEST, ABDOMEN, AND PELVIS WITH CONTRAST  TECHNIQUE: Multidetector CT imaging of the chest, abdomen and pelvis was performed following the standard protocol during bolus administration of intravenous contrast.   CONTRAST:  148mL OMNIPAQUE IOHEXOL 300 MG/ML  SOLN  COMPARISON:  CT ANGIO CHEST W/CM &/OR WO/CM dated 07/13/2013; CT CHEST W/CM dated 05/11/2013; CT ABD/PELVIS W CM dated 05/11/2013  FINDINGS: CT CHEST FINDINGS  Nodal mass in the right paratracheal region currently measures 4.5 x 4.9 cm on image 18 series 2. The margins are better defined within the mediastinum on today's study.  This was more difficult to measure on prior study, but when measured approximately the same levels and planes, I obtain a measurement of 4.1 x 4.1 cm previously. This has enlarged slightly since prior study. This has mass effect on the trachea with possible abnormal soft tissue extending into the right side of the airway/trachea. This abuts and narrows the right brachiocephalic vein and upper SVC. Right Port-A-Cath remains in place with the tip in the lower SVC near the cavoatrial junction.  Anterior mediastinal nodal mass on image 16 measures 2.0 x 1.6 cm compared with 2.1 x 1.9 cm previously, slightly smaller.  Small pericardial effusion again noted, slightly decreased.  Nodule in the medial aspect of the lingula on image 30 of series 5 measures 1.6 x 1.3 cm compared with 2.1 x 1.5 cm previously. Postoperative changes on the left from left lower lobectomy. Stable scarring in the posterior left upper lobe in the perihilar regions. Moderate centrilobular emphysema changes again noted. No new pulmonary nodules or masses. Stable medial right lower lobe nodule on image 35 of series 5 measuring 4 mm. No pleural effusions.  CT ABDOMEN AND PELVIS FINDINGS  Right adrenal mass has enlarged, measuring 3.7 x 2.9 cm compared with 2.4 x 2.1 cm previously. Adjacent necrotic appearing porta hepatis lymph node has enlarged, measuring 2.3 x 2.1 cm on image 55, not measurable on January study. Aortocaval retroperitoneal adenopathy measures 3.2 x 2.3 cm on image 62. This previously measured 2.1 x 1.5 cm.  Probable area of focal fatty infiltration adjacent to the  falciform ligament. No other focal hepatic lesions. Spleen, pancreas, left adrenal and kidneys are unremarkable. Small cysts in the lower poles of the kidneys bilaterally. No hydronephrosis.  Sigmoid diverticulosis. No active diverticulitis. Appendix and small bowel are unremarkable.  Uterus, adnexae and urinary bladder are grossly unremarkable. No free fluid or free air.  Aorta is heavily calcified, non aneurysmal.  Musculoskeletal: Increasing sclerotic appearance of numerous bony metastases throughout the thoracic spine, lumbar spine, bony pelvis, particularly the left hemipelvis, bilateral scapulae and left posterior ribs suggesting treated metastases. No definite new or enlarging bony metastases.  IMPRESSION: Mixed response to therapy. The anterior mediastinal nodal mass and the lingular nodule have decreased slightly in size. However, the right paratracheal nodal mass, right adrenal mass and abdominal adenopathy (portacaval and aortocaval) have enlarged.  Increasing sclerosis throughout the numerous bony metastases suggesting healing/treated metastases.  Small pericardial effusion, slightly decreased.  Electronically Signed: By: Rolm Baptise M.D. On: 08/03/2013 09:06   Ct Abdomen Pelvis W Contrast  08/05/2013   ADDENDUM REPORT: 08/05/2013 15:19  ADDENDUM: Recist measurements (target lesions - all greatest diameter):  Nodule in the medial aspect of the left upper lobe 1.6 cm on image 30.  Prevascular lymph node in the anterior mediastinum 2.0 cm on image 16.  Middle mediastinal nodal mass in the right paratracheal region 4.5 cm on image 18.  Right adrenal nodule 3.7 cm on image 56.  Non target lesions:  Pericardial fluid present, slightly decreased.  T11 vertebral lesion 2.6 cm on image 44.  Diffuse involvement of the left iliac crest, not measurable, stable.  Numerous sclerotic lesions including diffuse involvement of the left sacrum, not measurable, stable.   Electronically Signed   By: Rolm Baptise M.D.    On: 08/05/2013 15:19   08/05/2013   CLINICAL DATA:  Lung cancer, restaging.  Ongoing chemotherapy.  EXAM: CT CHEST, ABDOMEN, AND PELVIS WITH CONTRAST  TECHNIQUE: Multidetector CT imaging of the chest, abdomen and  pelvis was performed following the standard protocol during bolus administration of intravenous contrast.  CONTRAST:  153mL OMNIPAQUE IOHEXOL 300 MG/ML  SOLN  COMPARISON:  CT ANGIO CHEST W/CM &/OR WO/CM dated 07/13/2013; CT CHEST W/CM dated 05/11/2013; CT ABD/PELVIS W CM dated 05/11/2013  FINDINGS: CT CHEST FINDINGS  Nodal mass in the right paratracheal region currently measures 4.5 x 4.9 cm on image 18 series 2. The margins are better defined within the mediastinum on today's study. This was more difficult to measure on prior study, but when measured approximately the same levels and planes, I obtain a measurement of 4.1 x 4.1 cm previously. This has enlarged slightly since prior study. This has mass effect on the trachea with possible abnormal soft tissue extending into the right side of the airway/trachea. This abuts and narrows the right brachiocephalic vein and upper SVC. Right Port-A-Cath remains in place with the tip in the lower SVC near the cavoatrial junction.  Anterior mediastinal nodal mass on image 16 measures 2.0 x 1.6 cm compared with 2.1 x 1.9 cm previously, slightly smaller.  Small pericardial effusion again noted, slightly decreased.  Nodule in the medial aspect of the lingula on image 30 of series 5 measures 1.6 x 1.3 cm compared with 2.1 x 1.5 cm previously. Postoperative changes on the left from left lower lobectomy. Stable scarring in the posterior left upper lobe in the perihilar regions. Moderate centrilobular emphysema changes again noted. No new pulmonary nodules or masses. Stable medial right lower lobe nodule on image 35 of series 5 measuring 4 mm. No pleural effusions.  CT ABDOMEN AND PELVIS FINDINGS  Right adrenal mass has enlarged, measuring 3.7 x 2.9 cm compared with 2.4 x 2.1  cm previously. Adjacent necrotic appearing porta hepatis lymph node has enlarged, measuring 2.3 x 2.1 cm on image 55, not measurable on January study. Aortocaval retroperitoneal adenopathy measures 3.2 x 2.3 cm on image 62. This previously measured 2.1 x 1.5 cm.  Probable area of focal fatty infiltration adjacent to the falciform ligament. No other focal hepatic lesions. Spleen, pancreas, left adrenal and kidneys are unremarkable. Small cysts in the lower poles of the kidneys bilaterally. No hydronephrosis.  Sigmoid diverticulosis. No active diverticulitis. Appendix and small bowel are unremarkable.  Uterus, adnexae and urinary bladder are grossly unremarkable. No free fluid or free air.  Aorta is heavily calcified, non aneurysmal.  Musculoskeletal: Increasing sclerotic appearance of numerous bony metastases throughout the thoracic spine, lumbar spine, bony pelvis, particularly the left hemipelvis, bilateral scapulae and left posterior ribs suggesting treated metastases. No definite new or enlarging bony metastases.  IMPRESSION: Mixed response to therapy. The anterior mediastinal nodal mass and the lingular nodule have decreased slightly in size. However, the right paratracheal nodal mass, right adrenal mass and abdominal adenopathy (portacaval and aortocaval) have enlarged.  Increasing sclerosis throughout the numerous bony metastases suggesting healing/treated metastases.  Small pericardial effusion, slightly decreased.  Electronically Signed: By: Rolm Baptise M.D. On: 08/03/2013 09:06     ASSESSMENT AND PLAN: This is a very pleasant 63 years old [place] American female with recurrent non-small cell lung cancer.  She status post 3 cycle of systemic chemotherapy with single agent gemcitabine recently but unfortunately continues to have evidence for disease progression. She is currently participating in the BMS immunotherapy clinical probably with Nivolumab, status post 4 cycles.  Her restaging CT scans revealed  a mixed response. There was some decrease of nodules and tumor size in some areas with some increase in other areas. The  patient was discussed with also seen by Dr. Julien Nordmann. Dr. Julien Nordmann reviewed the CT scan results with Kaitlin Reed. She is elected to continue on the BMS Novolin N study through progression. She signed a new consent form. She'll proceed with treatment as scheduled today. We will continue to monitor her left hip pain. For now she will try taking Tylenol and soaking in Epsom salts to see if this gives her some relief. To stimulate her appetite she is given a prescription for Marinol 2.5 mg tablets by mouth twice daily a total of 60 with no refill. She'll followup in 2 weeks for another symptom management visit prior to her next scheduled cycle of immunotherapy.    For the metastatic bone disease, she will continue Zometa on a monthly basis.  She was advised to call immediately if she has any concerning symptoms in the interval. The patient voices understanding of current disease status and treatment options and is in agreement with the current care plan.  All questions were answered. The patient knows to call the clinic with any problems, questions or concerns. We can certainly see the patient much sooner if necessary.  Kaitlin Adam, PA-C  ADDENDUM: Hematology/Oncology Attending: I had a face to face encounter with the patient today. I recommended her care plan. This is a very pleasant 49 years African American female with recurrent non-small cell lung cancer, adenocarcinoma status post several chemotherapy regimens. She is currently on immunotherapy with Nivolumab which was on hold for the last few weeks secondary to palliative radiotherapy to the enlarging right paratracheal lymphadenopathy under the care of Dr. Pablo Ledger as treatment for recurrent hemoptysis. Her hemoptysis significantly improved. The patient completed the palliative radiotherapy 10 days ago. Her last CT scan of the  chest, abdomen and pelvis showed mixed response but the patient did meet criteria for disease progression according to the RECIST measurement. I discussed the scan results with the patient today and give her the option of continuing treatment with Nivolumab to disease progression versus switching treatment to single agent chemotherapy with Navelbine.  The patient would like to proceed with immunotherapy as scheduled. She will receive cycle #5 today. She did sign a consent to proceed with immunotherapy after progression, which sometimes can be pseudoprogression She would come back for followup visit in 2 weeks for reevaluation before the next cycle of her treatment. She was advised to call immediately if she has any concerning symptoms in the interval.  Disclaimer: This note was dictated with voice recognition software. Similar sounding words can inadvertently be transcribed and may not be corrected upon review. Curt Bears, MD 08/24/2013

## 2013-08-24 NOTE — Telephone Encounter (Signed)
gv adn prnted appts ched adn avs for pt for May...sed added tx.

## 2013-08-26 ENCOUNTER — Telehealth: Payer: Self-pay | Admitting: Medical Oncology

## 2013-08-26 NOTE — Telephone Encounter (Signed)
Returned pts call . Her appetite has not increased since she started marinol on Tuesday.

## 2013-08-27 NOTE — Telephone Encounter (Signed)
Per Burnetta Sabin I told Domique she needs to give it more time more than a week for effect on appetite.

## 2013-09-07 ENCOUNTER — Telehealth: Payer: Self-pay | Admitting: Internal Medicine

## 2013-09-07 ENCOUNTER — Other Ambulatory Visit: Payer: Self-pay | Admitting: *Deleted

## 2013-09-07 ENCOUNTER — Other Ambulatory Visit (HOSPITAL_BASED_OUTPATIENT_CLINIC_OR_DEPARTMENT_OTHER): Payer: Medicare Other

## 2013-09-07 ENCOUNTER — Ambulatory Visit: Payer: Medicare Other

## 2013-09-07 ENCOUNTER — Encounter: Payer: Self-pay | Admitting: *Deleted

## 2013-09-07 ENCOUNTER — Encounter: Payer: Self-pay | Admitting: Physician Assistant

## 2013-09-07 ENCOUNTER — Ambulatory Visit (HOSPITAL_BASED_OUTPATIENT_CLINIC_OR_DEPARTMENT_OTHER): Payer: Medicare Other | Admitting: Physician Assistant

## 2013-09-07 ENCOUNTER — Ambulatory Visit (HOSPITAL_BASED_OUTPATIENT_CLINIC_OR_DEPARTMENT_OTHER): Payer: Medicare Other

## 2013-09-07 VITALS — BP 99/56 | HR 117 | Temp 98.3°F | Resp 18 | Ht 66.0 in | Wt 122.3 lb

## 2013-09-07 DIAGNOSIS — C3490 Malignant neoplasm of unspecified part of unspecified bronchus or lung: Secondary | ICD-10-CM

## 2013-09-07 DIAGNOSIS — C3430 Malignant neoplasm of lower lobe, unspecified bronchus or lung: Secondary | ICD-10-CM

## 2013-09-07 DIAGNOSIS — C7952 Secondary malignant neoplasm of bone marrow: Secondary | ICD-10-CM

## 2013-09-07 DIAGNOSIS — C7951 Secondary malignant neoplasm of bone: Secondary | ICD-10-CM

## 2013-09-07 DIAGNOSIS — R5383 Other fatigue: Secondary | ICD-10-CM

## 2013-09-07 DIAGNOSIS — R5381 Other malaise: Secondary | ICD-10-CM

## 2013-09-07 DIAGNOSIS — R630 Anorexia: Secondary | ICD-10-CM

## 2013-09-07 DIAGNOSIS — Z5112 Encounter for antineoplastic immunotherapy: Secondary | ICD-10-CM

## 2013-09-07 LAB — CBC WITH DIFFERENTIAL/PLATELET
BASO%: 0.4 % (ref 0.0–2.0)
BASOS ABS: 0 10*3/uL (ref 0.0–0.1)
EOS%: 1.6 % (ref 0.0–7.0)
Eosinophils Absolute: 0.1 10*3/uL (ref 0.0–0.5)
HEMATOCRIT: 32.1 % — AB (ref 34.8–46.6)
HEMOGLOBIN: 10.2 g/dL — AB (ref 11.6–15.9)
LYMPH#: 1 10*3/uL (ref 0.9–3.3)
LYMPH%: 15 % (ref 14.0–49.7)
MCH: 26.4 pg (ref 25.1–34.0)
MCHC: 31.7 g/dL (ref 31.5–36.0)
MCV: 83.2 fL (ref 79.5–101.0)
MONO#: 0.7 10*3/uL (ref 0.1–0.9)
MONO%: 10.3 % (ref 0.0–14.0)
NEUT#: 5 10*3/uL (ref 1.5–6.5)
NEUT%: 72.7 % (ref 38.4–76.8)
Platelets: 416 10*3/uL — ABNORMAL HIGH (ref 145–400)
RBC: 3.86 10*6/uL (ref 3.70–5.45)
RDW: 16.7 % — ABNORMAL HIGH (ref 11.2–14.5)
WBC: 6.9 10*3/uL (ref 3.9–10.3)

## 2013-09-07 LAB — LACTATE DEHYDROGENASE (CC13): LDH: 618 U/L — ABNORMAL HIGH (ref 125–245)

## 2013-09-07 LAB — COMPREHENSIVE METABOLIC PANEL (CC13)
ALBUMIN: 3.7 g/dL (ref 3.5–5.0)
ALT: 6 U/L (ref 0–55)
ANION GAP: 14 meq/L — AB (ref 3–11)
AST: 26 U/L (ref 5–34)
Alkaline Phosphatase: 433 U/L — ABNORMAL HIGH (ref 40–150)
BUN: 17.1 mg/dL (ref 7.0–26.0)
CALCIUM: 10.1 mg/dL (ref 8.4–10.4)
CHLORIDE: 101 meq/L (ref 98–109)
CO2: 23 meq/L (ref 22–29)
CREATININE: 1 mg/dL (ref 0.6–1.1)
Glucose: 111 mg/dl (ref 70–140)
POTASSIUM: 3.8 meq/L (ref 3.5–5.1)
Sodium: 138 mEq/L (ref 136–145)
Total Bilirubin: 0.23 mg/dL (ref 0.20–1.20)
Total Protein: 8.3 g/dL (ref 6.4–8.3)

## 2013-09-07 LAB — PHOSPHORUS: Phosphorus: 4.2 mg/dL (ref 2.3–4.6)

## 2013-09-07 LAB — MAGNESIUM (CC13): MAGNESIUM: 2.1 mg/dL (ref 1.5–2.5)

## 2013-09-07 MED ORDER — SODIUM CHLORIDE 0.9 % IV SOLN
Freq: Once | INTRAVENOUS | Status: AC
  Administered 2013-09-07: 17:00:00 via INTRAVENOUS

## 2013-09-07 MED ORDER — TRAMADOL HCL 50 MG PO TABS: 50.0000 mg | ORAL_TABLET | Freq: Four times a day (QID) | ORAL | Status: DC | PRN

## 2013-09-07 MED ORDER — HEPARIN SOD (PORK) LOCK FLUSH 100 UNIT/ML IV SOLN
500.0000 [IU] | Freq: Once | INTRAVENOUS | Status: AC | PRN
  Administered 2013-09-07: 500 [IU]
  Filled 2013-09-07: qty 5

## 2013-09-07 MED ORDER — SODIUM CHLORIDE 0.9 % IJ SOLN
10.0000 mL | INTRAMUSCULAR | Status: DC | PRN
  Administered 2013-09-07: 10 mL
  Filled 2013-09-07: qty 10

## 2013-09-07 MED ORDER — SODIUM CHLORIDE 0.9 % IV SOLN
3.0000 mg/kg | Freq: Once | INTRAVENOUS | Status: AC
  Administered 2013-09-07: 172 mg via INTRAVENOUS
  Filled 2013-09-07: qty 17.2

## 2013-09-07 NOTE — Telephone Encounter (Signed)
Gave pt appt for lab ,md and chemo for MAy and emailed Sharyn Lull regarding chemo

## 2013-09-07 NOTE — Progress Notes (Signed)
[facility] [phone]   [phone]  SHARED VISIT PROGRESS NOTE  [name], MD [place] [id number]  DIAGNOSIS: Recurrent non-small cell lung cancer initially diagnosed as stage IIA (T1 N1 MX) adenocarcinoma in April 2011.   PRIOR THERAPY:  1. Status post 3 cycles of neoadjuvant chemotherapy with carboplatin and Alimta. Last dose was given July 23, 2009. 2. Status post left lower lobectomy with lymph node dissection under the care of Dr. Arlyce Dice on October 17, 2009. 3. Status post concurrent chemoradiation with weekly carboplatin and paclitaxel for disease recurrence. Last dose was given May 07, 2010. 4. Status post 3 cycles of consolidation chemotherapy with carboplatin and Alimta. Last dose was given August 13, 2010. 5. Status post palliative radiotherapy to the right neck area. 6. Systemic chemotherapy with single agent Taxotere 75 mg/m2 every 3 weeks,, status post 6 cycles, last dose was given on 09/03/2011. 7. Systemic chemotherapy with carboplatin for an AUC of 5 and Alimta at 500 mg per meter square given every 3 weeks, status post 6 cycles. 8. Maintenance chemotherapy with single agent Alimta 500 mg/M2 every 3 weeks, status post 3 cycles discontinued today secondary to disease progression.  9. Systemic chemotherapy with single agent gemcitabine 1000 mg/M2 on days 1 and 8 every 3 weeks, status post 3 cycles discontinued today secondary to disease progression. First cycle on 03/15/2013.   CURRENT THERAPY:   1)  Immunotherapy according to the BMS clinical trial with Nivolumab. Status post 5 cycles 2) Zometa 4 mg IV every month. First dose given 05/17/2013  CHEMOTHERAPY INTENT: Palliative  CURRENT # OF CHEMOTHERAPY CYCLES: 6  CURRENT ANTIEMETICS: Zofran, dexamethasone and Compazine  CURRENT SMOKING STATUS: Former smoker  ORAL CHEMOTHERAPY AND CONSENT: None  CURRENT BISPHOSPHONATES USE: None  PAIN  MANAGEMENT: None  NARCOTICS INDUCED CONSTIPATION: None  LIVING WILL AND CODE STATUS: Full code initially but no longer term resuscitation.    INTERVAL HISTORY: Kaitlin Reed 63 y.o. female returns to the clinic today for followup visit. She is status post 5 cycles of  immunotherapy with Nivolumab according to the BMS 153 clinical trial. She was admitted from 07/22/1998 1520 07/26/2013 for acute hemoptysis. During the admission she had a bronchoscopy that revealed tracheal involvement of the tumor. She was treated with radiation therapy from 07/29/2013 through 08/13/2013. She reports she's had no further episodes of hemoptysis. She complains of significant increased fatigue. Her weight is down an additional approximate 4 pounds since her last office visit. We started her on Marinol 2.5 mg by mouth twice daily for appetite stimulation. She did not tolerate the dear E. based boost or Ensure supplements. This gave her significant GI distress. She has not tried the juice based nutritional supplemental drinks. We'll see with some samples in the chemotherapy area for her to try. She continues to complain of left hip and left groin pain. She has been soaking in Epsom salts and does find this to be somewhat helpful. S She denies shortness of breath and she's had no diarrhea or skin rash.  She does note decreased energy. She has a vitamin supplement the she will also start taking to help with with hair regrowth. It is a combination of B. a and E. Vitamins. She denies any numbness or tingling.  She denied having any fever or chills. She has no nausea or vomiting. The patient denied having any significant chest pain, shortness of breath, or other significant cough. She denied  night sweats.   MEDICAL HISTORY: Past Medical History  Diagnosis Date  . Heart murmur   . 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 Anemia   . History of blood transfusion 1970's    "related to tubal pregnancy"   . lung ca dx'd 06/2009    chemo comp  07/2010; xrt comp 06/2010    ALLERGIES:  is allergic to codeine; motrin; other; trazodone and nefazodone; banana; tape; and xgeva.  MEDICATIONS:  Current Outpatient Prescriptions  Medication Sig Dispense Refill  . albuterol (PROAIR HFA) 108 (90 BASE) MCG/ACT inhaler Inhale 2 puffs into the lungs every 6 (six) hours as needed for wheezing or shortness of breath.  1 Inhaler  6  . Ascorbic Acid (VITAMIN C) 1000 MG tablet Take 1,000 mg by mouth daily.      . calcium carbonate (OS-CAL) 600 MG TABS Take 600 mg by mouth 2 (two) times daily with a meal.      . Cholecalciferol (VITAMIN D) 2000 UNITS tablet Take 2,000 Units by mouth daily.      Marland Kitchen dronabinol (MARINOL) 2.5 MG capsule Take 1 capsule (2.5 mg total) by mouth 2 (two) times daily before a meal.  60 capsule  0  . ferrous sulfate 325 (65 FE) MG tablet Take 1 tablet (325 mg total) by mouth 2 (two) times daily with a meal.      . hyaluronate sodium (RADIAPLEXRX) GEL Apply 1 application topically 2 (two) times daily.      . Multiple Vitamin (MULITIVITAMIN WITH MINERALS) TABS Take 1 tablet by mouth daily.       Marland Kitchen NIVOLUMAB IV Inject 3 mg/kg into the vein every 14 (fourteen) days. *iv infusion that runs for 76min*      . potassium chloride (K-DUR,KLOR-CON) 10 MEQ tablet Take 1 tablet (10 mEq total) by mouth 2 (two) times daily.  60 tablet  3  . temazepam (RESTORIL) 15 MG capsule Take 15 mg by mouth at bedtime as needed for sleep.      . vitamin B-12 (CYANOCOBALAMIN) 1000 MCG tablet Take 1,000 mcg by mouth daily.      . vitamin E 400 UNIT capsule Take 400 Units by mouth daily.      Marland Kitchen aspirin EC 81 MG tablet Take 81 mg by mouth daily.      . Bromfenac Sodium (PROLENSA) 0.07 % SOLN Place 1 drop into the left eye daily.       . metoprolol tartrate (LOPRESSOR) 25 MG tablet Take 25 mg by mouth 2 (two) times daily.      . Selenium 200 MCG CAPS Take 200 mcg by mouth daily.       . [DISCONTINUED] Alum & Mag Hydroxide-Simeth (MAGIC MOUTHWASH W/LIDOCAINE) SOLN  Take 10 mLs by mouth 4 (four) times daily as needed.  500 mL  5   No current facility-administered medications for this visit.    SURGICAL HISTORY:  Past Surgical History  Procedure Laterality Date  . Lung removal, partial Left 10/17/09    pt states they removed small piece of lung due to cancer  . 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 Insertion central venous access device w/ subcutaneous port Right 05/2013    "PowerPort"  . Video bronchoscopy Bilateral 07/23/2013    Procedure: VIDEO BRONCHOSCOPY WITHOUT FLUORO;  Surgeon: Doree Fudge, MD;  Location: [facility];  Service: Cardiopulmonary;  Laterality: Bilateral;    REVIEW OF SYSTEMS:  Constitutional: positive for fatigue and weight loss Eyes: negative Ears, nose,  mouth, throat, and face: negative Respiratory: positive for cough Cardiovascular: negative Gastrointestinal: negative Genitourinary:negative Integument/breast: negative Hematologic/lymphatic: negative Musculoskeletal:positive for arthralgias, myalgias and Left hip and low back pain Neurological: negative Behavioral/Psych: negative Endocrine: negative Allergic/Immunologic: negative   PHYSICAL EXAMINATION: General appearance: alert, cooperative, fatigued and no distress Head: Normocephalic, without obvious abnormality, atraumatic Neck: no adenopathy Lymph nodes: Cervical, supraclavicular, and axillary nodes normal. Resp: clear to auscultation bilaterally Cardio: regular rate and rhythm, S1, S2 normal, no murmur, click, rub or gallop GI: soft, non-tender; bowel sounds normal; no masses,  no organomegaly Extremities: extremities normal, atraumatic, no cyanosis or edema and Tender palpable left inguinal ligament versus lymph node. Nothing similar palpated in the right groin Neurologic: Alert and oriented X 3, normal strength and tone. Normal symmetric reflexes. Normal coordination and gait Right anterior chest Port-A-Cath, well-healed, no evidence  of infection or inflammation, non-tender  ECOG PERFORMANCE STATUS: 1 - Symptomatic but completely ambulatory  There were no vitals taken for this visit.  LABORATORY DATA: Lab Results  Component Value Date   WBC 6.9 09/07/2013   HGB 10.2* 09/07/2013   HCT 32.1* 09/07/2013   MCV 83.2 09/07/2013   PLT 416* 09/07/2013      Chemistry      Component Value Date/Time   NA 138 09/07/2013 1511   NA 136* 07/24/2013 0500   NA 138 11/06/2011 1121   K 3.8 09/07/2013 1511   K 4.6 07/24/2013 0500   K 4.3 11/06/2011 1121   CL 98 07/24/2013 0500   CL 105 10/07/2012 1350   CL 98 11/06/2011 1121   CO2 23 09/07/2013 1511   CO2 20 07/24/2013 0500   CO2 30 11/06/2011 1121   BUN 17.1 09/07/2013 1511   BUN 9 07/24/2013 0500   BUN 15 11/06/2011 1121   CREATININE 1.0 09/07/2013 1511   CREATININE 0.78 07/24/2013 0500   CREATININE 0.9 11/06/2011 1121      Component Value Date/Time   CALCIUM 10.1 09/07/2013 1511   CALCIUM 8.8 07/24/2013 0500   CALCIUM 9.0 11/06/2011 1121   ALKPHOS 433* 09/07/2013 1511   ALKPHOS 238* 04/26/2013 1639   ALKPHOS 110* 11/06/2011 1121   AST 26 09/07/2013 1511   AST 16 04/26/2013 1639   AST 36 11/06/2011 1121   ALT 6 09/07/2013 1511   ALT <5 04/26/2013 1639   ALT 23 11/06/2011 1121   BILITOT 0.23 09/07/2013 1511   BILITOT 0.2* 04/26/2013 1639   BILITOT 0.50 11/06/2011 1121       RADIOGRAPHIC STUDIES: Ct Chest W Contrast  08/05/2013   ADDENDUM REPORT: 08/05/2013 15:19  ADDENDUM: Recist measurements (target lesions - all greatest diameter):  Nodule in the medial aspect of the left upper lobe 1.6 cm on image 30.  Prevascular lymph node in the anterior mediastinum 2.0 cm on image 16.  Middle mediastinal nodal mass in the right paratracheal region 4.5 cm on image 18.  Right adrenal nodule 3.7 cm on image 56.  Non target lesions:  Pericardial fluid present, slightly decreased.  T11 vertebral lesion 2.6 cm on image 44.  Diffuse involvement of the left iliac crest, not measurable, stable.  Numerous  sclerotic lesions including diffuse involvement of the left sacrum, not measurable, stable.   Electronically Signed   By: Rolm Baptise M.D.   On: 08/05/2013 15:19   08/05/2013   CLINICAL DATA:  Lung cancer, restaging.  Ongoing chemotherapy.  EXAM: CT CHEST, ABDOMEN, AND PELVIS WITH CONTRAST  TECHNIQUE: Multidetector CT imaging of the chest, abdomen and  pelvis was performed following the standard protocol during bolus administration of intravenous contrast.  CONTRAST:  121mL OMNIPAQUE IOHEXOL 300 MG/ML  SOLN  COMPARISON:  CT ANGIO CHEST W/CM &/OR WO/CM dated 07/13/2013; CT CHEST W/CM dated 05/11/2013; CT ABD/PELVIS W CM dated 05/11/2013  FINDINGS: CT CHEST FINDINGS  Nodal mass in the right paratracheal region currently measures 4.5 x 4.9 cm on image 18 series 2. The margins are better defined within the mediastinum on today's study. This was more difficult to measure on prior study, but when measured approximately the same levels and planes, I obtain a measurement of 4.1 x 4.1 cm previously. This has enlarged slightly since prior study. This has mass effect on the trachea with possible abnormal soft tissue extending into the right side of the airway/trachea. This abuts and narrows the right brachiocephalic vein and upper SVC. Right Port-A-Cath remains in place with the tip in the lower SVC near the cavoatrial junction.  Anterior mediastinal nodal mass on image 16 measures 2.0 x 1.6 cm compared with 2.1 x 1.9 cm previously, slightly smaller.  Small pericardial effusion again noted, slightly decreased.  Nodule in the medial aspect of the lingula on image 30 of series 5 measures 1.6 x 1.3 cm compared with 2.1 x 1.5 cm previously. Postoperative changes on the left from left lower lobectomy. Stable scarring in the posterior left upper lobe in the perihilar regions. Moderate centrilobular emphysema changes again noted. No new pulmonary nodules or masses. Stable medial right lower lobe nodule on image 35 of series 5 measuring  4 mm. No pleural effusions.  CT ABDOMEN AND PELVIS FINDINGS  Right adrenal mass has enlarged, measuring 3.7 x 2.9 cm compared with 2.4 x 2.1 cm previously. Adjacent necrotic appearing porta hepatis lymph node has enlarged, measuring 2.3 x 2.1 cm on image 55, not measurable on January study. Aortocaval retroperitoneal adenopathy measures 3.2 x 2.3 cm on image 62. This previously measured 2.1 x 1.5 cm.  Probable area of focal fatty infiltration adjacent to the falciform ligament. No other focal hepatic lesions. Spleen, pancreas, left adrenal and kidneys are unremarkable. Small cysts in the lower poles of the kidneys bilaterally. No hydronephrosis.  Sigmoid diverticulosis. No active diverticulitis. Appendix and small bowel are unremarkable.  Uterus, adnexae and urinary bladder are grossly unremarkable. No free fluid or free air.  Aorta is heavily calcified, non aneurysmal.  Musculoskeletal: Increasing sclerotic appearance of numerous bony metastases throughout the thoracic spine, lumbar spine, bony pelvis, particularly the left hemipelvis, bilateral scapulae and left posterior ribs suggesting treated metastases. No definite new or enlarging bony metastases.  IMPRESSION: Mixed response to therapy. The anterior mediastinal nodal mass and the lingular nodule have decreased slightly in size. However, the right paratracheal nodal mass, right adrenal mass and abdominal adenopathy (portacaval and aortocaval) have enlarged.  Increasing sclerosis throughout the numerous bony metastases suggesting healing/treated metastases.  Small pericardial effusion, slightly decreased.  Electronically Signed: By: Rolm Baptise M.D. On: 08/03/2013 09:06   Ct Abdomen Pelvis W Contrast  08/05/2013   ADDENDUM REPORT: 08/05/2013 15:19  ADDENDUM: Recist measurements (target lesions - all greatest diameter):  Nodule in the medial aspect of the left upper lobe 1.6 cm on image 30.  Prevascular lymph node in the anterior mediastinum 2.0 cm on image  16.  Middle mediastinal nodal mass in the right paratracheal region 4.5 cm on image 18.  Right adrenal nodule 3.7 cm on image 56.  Non target lesions:  Pericardial fluid present, slightly decreased.  T11 vertebral  lesion 2.6 cm on image 44.  Diffuse involvement of the left iliac crest, not measurable, stable.  Numerous sclerotic lesions including diffuse involvement of the left sacrum, not measurable, stable.   Electronically Signed   By: Rolm Baptise M.D.   On: 08/05/2013 15:19   08/05/2013   CLINICAL DATA:  Lung cancer, restaging.  Ongoing chemotherapy.  EXAM: CT CHEST, ABDOMEN, AND PELVIS WITH CONTRAST  TECHNIQUE: Multidetector CT imaging of the chest, abdomen and pelvis was performed following the standard protocol during bolus administration of intravenous contrast.  CONTRAST:  124mL OMNIPAQUE IOHEXOL 300 MG/ML  SOLN  COMPARISON:  CT ANGIO CHEST W/CM &/OR WO/CM dated 07/13/2013; CT CHEST W/CM dated 05/11/2013; CT ABD/PELVIS W CM dated 05/11/2013  FINDINGS: CT CHEST FINDINGS  Nodal mass in the right paratracheal region currently measures 4.5 x 4.9 cm on image 18 series 2. The margins are better defined within the mediastinum on today's study. This was more difficult to measure on prior study, but when measured approximately the same levels and planes, I obtain a measurement of 4.1 x 4.1 cm previously. This has enlarged slightly since prior study. This has mass effect on the trachea with possible abnormal soft tissue extending into the right side of the airway/trachea. This abuts and narrows the right brachiocephalic vein and upper SVC. Right Port-A-Cath remains in place with the tip in the lower SVC near the cavoatrial junction.  Anterior mediastinal nodal mass on image 16 measures 2.0 x 1.6 cm compared with 2.1 x 1.9 cm previously, slightly smaller.  Small pericardial effusion again noted, slightly decreased.  Nodule in the medial aspect of the lingula on image 30 of series 5 measures 1.6 x 1.3 cm compared with  2.1 x 1.5 cm previously. Postoperative changes on the left from left lower lobectomy. Stable scarring in the posterior left upper lobe in the perihilar regions. Moderate centrilobular emphysema changes again noted. No new pulmonary nodules or masses. Stable medial right lower lobe nodule on image 35 of series 5 measuring 4 mm. No pleural effusions.  CT ABDOMEN AND PELVIS FINDINGS  Right adrenal mass has enlarged, measuring 3.7 x 2.9 cm compared with 2.4 x 2.1 cm previously. Adjacent necrotic appearing porta hepatis lymph node has enlarged, measuring 2.3 x 2.1 cm on image 55, not measurable on January study. Aortocaval retroperitoneal adenopathy measures 3.2 x 2.3 cm on image 62. This previously measured 2.1 x 1.5 cm.  Probable area of focal fatty infiltration adjacent to the falciform ligament. No other focal hepatic lesions. Spleen, pancreas, left adrenal and kidneys are unremarkable. Small cysts in the lower poles of the kidneys bilaterally. No hydronephrosis.  Sigmoid diverticulosis. No active diverticulitis. Appendix and small bowel are unremarkable.  Uterus, adnexae and urinary bladder are grossly unremarkable. No free fluid or free air.  Aorta is heavily calcified, non aneurysmal.  Musculoskeletal: Increasing sclerotic appearance of numerous bony metastases throughout the thoracic spine, lumbar spine, bony pelvis, particularly the left hemipelvis, bilateral scapulae and left posterior ribs suggesting treated metastases. No definite new or enlarging bony metastases.  IMPRESSION: Mixed response to therapy. The anterior mediastinal nodal mass and the lingular nodule have decreased slightly in size. However, the right paratracheal nodal mass, right adrenal mass and abdominal adenopathy (portacaval and aortocaval) have enlarged.  Increasing sclerosis throughout the numerous bony metastases suggesting healing/treated metastases.  Small pericardial effusion, slightly decreased.  Electronically Signed: By: Rolm Baptise M.D. On: 08/03/2013 09:06     ASSESSMENT AND PLAN: This is a very  pleasant 63 years old [place] American female with recurrent non-small cell lung cancer.  She status post 3 cycle of systemic chemotherapy with single agent gemcitabine recently but unfortunately continues to have evidence for disease progression. She is currently participating in the BMS immunotherapy clinical probably with Nivolumab, status post 5 cycles.  Her restaging CT scans revealed a mixed response. There was some decrease of nodules and tumor size in some areas with some increase in other areas. The patient was discussed with also seen by Dr. Julien Nordmann. Dr. Julien Nordmann reviewed the CT scan results with Ms. Walraven. She is elected to continue on the BMS Nivolumab study through progression. She signed a new consent form. We will continue to monitor her left hip pain as well as the palpable tendon versus lymph node. For pain management I've given her prescription for Ultram 50 mg by mouth every 6 hours as needed for pain a total of 30 with no refill. Hopefully she'll be able to tolerate this medication and we'll provide her some relief. For her appetite we've asked patient to increase her Marinol to 5 mg by mouth twice daily and let us know how this increased dose has worked with her remaining tablets. She'll proceed with treatment #6 as scheduled today.  She'll followup in 2 weeks for another symptom management visit prior to her next scheduled cycle of immunotherapy. She'll be due for restaging CT scan again on 09/30/2013.   For the metastatic bone disease, she will continue Zometa on a monthly basis.  She was advised to call immediately if she has any concerning symptoms in the interval. The patient voices understanding of current disease status and treatment options and is in agreement with the current care plan.  All questions were answered. The patient knows to call the clinic with any problems, questions or concerns. We can  certainly see the patient much sooner if necessary.  Carlton Adam, PA-C  ADDENDUM: Hematology/Oncology Attending: I had a face to face encounter with the patient today. I recommended her care plan. This is a very pleasant 64 years old [place] American female with recurrent non-small cell lung cancer, adenocarcinoma status post several chemotherapy regimens and currently undergoing immunotherapy according to the BMS clinical trial with Nivolumab status post 5 cycles. She is tolerating her treatment fairly well except for fatigue and lack of appetite. I recommended for her to continue her current treatment with Nivolumab as scheduled. She was advised to increase the dose of Marinol to 5 mg by mouth twice a day for the lack of appetite. The patient would come back for followup visit in 2 weeks with the next cycle of her chemotherapy. She was advised to call immediately if she has any concerning symptoms in the interval.  Disclaimer: This note was dictated with voice recognition software. Similar sounding words can inadvertently be transcribed and may not be corrected upon review. Curt Bears, MD 09/07/2013

## 2013-09-07 NOTE — Patient Instructions (Signed)
[facility] Discharge Instructions for Patients Receiving Chemotherapy  Today you received the following chemotherapy agents: Nivoulamab  To help prevent nausea and vomiting after your treatment, we encourage you to take your nausea medication.  Take it as often as prescribed.     If you develop nausea and vomiting that is not controlled by your nausea medication, call the clinic. If it is after clinic hours your family physician or the after hours number for the clinic or go to the Emergency Department.   BELOW ARE SYMPTOMS THAT SHOULD BE REPORTED IMMEDIATELY:  *FEVER GREATER THAN 100.5 F  *CHILLS WITH OR WITHOUT FEVER  NAUSEA AND VOMITING THAT IS NOT CONTROLLED WITH YOUR NAUSEA MEDICATION  *UNUSUAL SHORTNESS OF BREATH  *UNUSUAL BRUISING OR BLEEDING  TENDERNESS IN MOUTH AND THROAT WITH OR WITHOUT PRESENCE OF ULCERS  *URINARY PROBLEMS  *BOWEL PROBLEMS  UNUSUAL RASH Items with * indicate a potential emergency and should be followed up as soon as possible.  Feel free to call the clinic you have any questions or concerns. The clinic phone number is [phone].   I have been informed and understand all the instructions given to me. I know to contact the clinic, my physician, or go to the Emergency Department if any problems should occur. I do not have any questions at this time, but understand that I may call the clinic during office hours   should I have any questions or need assistance in obtaining follow up care.    __________________________________________  _____________  __________ Signature of Patient or Authorized Representative            Date                   Time    __________________________________________ Nurse's Signature

## 2013-09-07 NOTE — Progress Notes (Signed)
09/07/2013 BMS [id number] cycle 6 Mrs. Kaitlin Reed is in the cancer center today for lab work physical exam, and treatment with nivolumab. She is concerned by increasing fatigue making it difficult for her to work. She is also concerned about continued weight loss. Lab results reviewed by Dr. Julien Nordmann and found to be WNL for treatment.  Sign for infusion given to T. Mateo Flow, Therapist, sports.

## 2013-09-07 NOTE — Patient Instructions (Signed)
Increase her Marinol to 5 mg by mouth twice daily to help stimulate her appetite with your remaining tablets Take the Ultram 50 mg by mouth every 6 hours as needed for pain Follow up in 2 weeks prior to next scheduled cycle of immunotherapy

## 2013-09-08 ENCOUNTER — Telehealth: Payer: Self-pay | Admitting: *Deleted

## 2013-09-08 NOTE — Telephone Encounter (Signed)
Per staff message and POF I have scheduled appts.  JMW  

## 2013-09-12 ENCOUNTER — Telehealth: Payer: Self-pay | Admitting: Internal Medicine

## 2013-09-12 NOTE — Telephone Encounter (Signed)
Called pt and left messsage regarding chemo lab and Md for may and June 2015, mailed appt

## 2013-09-14 ENCOUNTER — Telehealth: Payer: Self-pay | Admitting: Dietician

## 2013-09-14 NOTE — Telephone Encounter (Signed)
Brief Outpatient Oncology Nutrition Note  Patient has been identified to be at risk on malnutrition screen.  Wt Readings from Last 10 Encounters:  09/07/13 122 lb 4.8 oz (55.475 kg)  08/24/13 126 lb 3.2 oz (57.244 kg)  08/11/13 131 lb 1.6 oz (59.467 kg)  08/11/13 130 lb (58.968 kg)  08/03/13 130 lb 12.8 oz (59.33 kg)  07/27/13 131 lb 9.6 oz (59.693 kg)  07/26/13 129 lb 1.6 oz (58.559 kg)  07/26/13 129 lb 1.6 oz (58.559 kg)  07/20/13 136 lb 4.8 oz (61.825 kg)  07/06/13 137 lb 14.4 oz (62.551 kg)     Dx:  Recurrent non-small cell lung cancer initially diagnosed as stage IIA adenocarcinoma in April 2011.   Called patient due to weight loss.  Patient has lost 15 lbs in the past 2 months.  Patient is receiving Marinol which helps her appetite.  Eats several small meals per day.  Does not tolerate Boost or Ensure from a GI standpoint and is lactose intolerant.  Is going to try the clear liquid Ensure.  Tips to increase calories and protein handout along with contact information for the [facility] and coupons will be mailed to patient.  Antonieta Iba, RD, LDN

## 2013-09-16 ENCOUNTER — Ambulatory Visit: Payer: Medicare Other | Admitting: Radiation Oncology

## 2013-09-16 ENCOUNTER — Telehealth: Payer: Self-pay | Admitting: *Deleted

## 2013-09-16 NOTE — Telephone Encounter (Signed)
Left message for Mrs. Kaitlin Reed that her lab appointment on 5/26 was rescheduled at an earlier time, 2:15. This is to allow time for lab results to return before treatment.

## 2013-09-21 ENCOUNTER — Other Ambulatory Visit: Payer: Medicare Other

## 2013-09-21 ENCOUNTER — Ambulatory Visit (HOSPITAL_BASED_OUTPATIENT_CLINIC_OR_DEPARTMENT_OTHER): Payer: Medicare Other

## 2013-09-21 ENCOUNTER — Other Ambulatory Visit (HOSPITAL_BASED_OUTPATIENT_CLINIC_OR_DEPARTMENT_OTHER): Payer: Medicare Other

## 2013-09-21 ENCOUNTER — Encounter: Payer: Self-pay | Admitting: *Deleted

## 2013-09-21 ENCOUNTER — Ambulatory Visit (HOSPITAL_BASED_OUTPATIENT_CLINIC_OR_DEPARTMENT_OTHER): Payer: Medicare Other | Admitting: Internal Medicine

## 2013-09-21 ENCOUNTER — Encounter: Payer: Self-pay | Admitting: Internal Medicine

## 2013-09-21 VITALS — BP 90/50 | HR 113 | Temp 96.7°F | Resp 21 | Ht 66.0 in | Wt 118.8 lb

## 2013-09-21 DIAGNOSIS — C7951 Secondary malignant neoplasm of bone: Secondary | ICD-10-CM

## 2013-09-21 DIAGNOSIS — C3430 Malignant neoplasm of lower lobe, unspecified bronchus or lung: Secondary | ICD-10-CM

## 2013-09-21 DIAGNOSIS — E860 Dehydration: Secondary | ICD-10-CM

## 2013-09-21 DIAGNOSIS — C3490 Malignant neoplasm of unspecified part of unspecified bronchus or lung: Secondary | ICD-10-CM

## 2013-09-21 DIAGNOSIS — C7952 Secondary malignant neoplasm of bone marrow: Secondary | ICD-10-CM

## 2013-09-21 DIAGNOSIS — I10 Essential (primary) hypertension: Secondary | ICD-10-CM

## 2013-09-21 DIAGNOSIS — R634 Abnormal weight loss: Secondary | ICD-10-CM

## 2013-09-21 DIAGNOSIS — Z5112 Encounter for antineoplastic immunotherapy: Secondary | ICD-10-CM

## 2013-09-21 DIAGNOSIS — R5381 Other malaise: Secondary | ICD-10-CM

## 2013-09-21 DIAGNOSIS — R5383 Other fatigue: Secondary | ICD-10-CM

## 2013-09-21 LAB — COMPREHENSIVE METABOLIC PANEL (CC13)
ALT: 17 U/L (ref 0–55)
ANION GAP: 17 meq/L — AB (ref 3–11)
AST: 48 U/L — ABNORMAL HIGH (ref 5–34)
Albumin: 3.8 g/dL (ref 3.5–5.0)
Alkaline Phosphatase: 536 U/L — ABNORMAL HIGH (ref 40–150)
BILIRUBIN TOTAL: 0.32 mg/dL (ref 0.20–1.20)
BUN: 21.5 mg/dL (ref 7.0–26.0)
CO2: 20 mEq/L — ABNORMAL LOW (ref 22–29)
CREATININE: 1.1 mg/dL (ref 0.6–1.1)
Calcium: 10 mg/dL (ref 8.4–10.4)
Chloride: 102 mEq/L (ref 98–109)
GLUCOSE: 111 mg/dL (ref 70–140)
Potassium: 4.3 mEq/L (ref 3.5–5.1)
Sodium: 138 mEq/L (ref 136–145)
TOTAL PROTEIN: 8.4 g/dL — AB (ref 6.4–8.3)

## 2013-09-21 LAB — CBC & DIFF AND RETIC
BASO%: 0.1 % (ref 0.0–2.0)
Basophils Absolute: 0 10*3/uL (ref 0.0–0.1)
EOS ABS: 0 10*3/uL (ref 0.0–0.5)
EOS%: 0.5 % (ref 0.0–7.0)
HEMATOCRIT: 33.5 % — AB (ref 34.8–46.6)
HEMOGLOBIN: 10.8 g/dL — AB (ref 11.6–15.9)
IMMATURE RETIC FRACT: 15.5 % — AB (ref 1.60–10.00)
LYMPH#: 1 10*3/uL (ref 0.9–3.3)
LYMPH%: 13.3 % — ABNORMAL LOW (ref 14.0–49.7)
MCH: 26.2 pg (ref 25.1–34.0)
MCHC: 32.2 g/dL (ref 31.5–36.0)
MCV: 81.1 fL (ref 79.5–101.0)
MONO#: 0.6 10*3/uL (ref 0.1–0.9)
MONO%: 7.7 % (ref 0.0–14.0)
NEUT%: 78.4 % — AB (ref 38.4–76.8)
NEUTROS ABS: 6.1 10*3/uL (ref 1.5–6.5)
Platelets: 376 10*3/uL (ref 145–400)
RBC: 4.13 10*6/uL (ref 3.70–5.45)
RDW: 15.3 % — ABNORMAL HIGH (ref 11.2–14.5)
Retic %: 1.69 % (ref 0.70–2.10)
Retic Ct Abs: 69.8 10*3/uL (ref 33.70–90.70)
WBC: 7.8 10*3/uL (ref 3.9–10.3)
nRBC: 0 % (ref 0–0)

## 2013-09-21 LAB — LACTATE DEHYDROGENASE (CC13): LDH: 687 U/L — ABNORMAL HIGH (ref 125–245)

## 2013-09-21 LAB — TSH CHCC: TSH: 2.919 m(IU)/L (ref 0.308–3.960)

## 2013-09-21 LAB — MAGNESIUM (CC13): MAGNESIUM: 2.3 mg/dL (ref 1.5–2.5)

## 2013-09-21 LAB — PHOSPHORUS: Phosphorus: 3.9 mg/dL (ref 2.3–4.6)

## 2013-09-21 MED ORDER — SODIUM CHLORIDE 0.9 % IV SOLN: Freq: Once | INTRAVENOUS | Status: DC

## 2013-09-21 MED ORDER — SODIUM CHLORIDE 0.9 % IV SOLN
Freq: Once | INTRAVENOUS | Status: DC
  Administered 2013-09-21: 16:00:00 via INTRAVENOUS

## 2013-09-21 MED ORDER — ZOLEDRONIC ACID 4 MG/100ML IV SOLN
4.0000 mg | Freq: Once | INTRAVENOUS | Status: AC
  Administered 2013-09-21: 4 mg via INTRAVENOUS
  Filled 2013-09-21: qty 100

## 2013-09-21 MED ORDER — DRONABINOL 2.5 MG PO CAPS: 2.5000 mg | ORAL_CAPSULE | Freq: Two times a day (BID) | ORAL | Status: AC

## 2013-09-21 MED ORDER — SODIUM CHLORIDE 0.9 % IJ SOLN
10.0000 mL | INTRAMUSCULAR | Status: DC | PRN
  Administered 2013-09-21: 10 mL
  Filled 2013-09-21: qty 10

## 2013-09-21 MED ORDER — SODIUM CHLORIDE 0.9 % IV SOLN
3.0000 mg/kg | Freq: Once | INTRAVENOUS | Status: AC
  Administered 2013-09-21: 172 mg via INTRAVENOUS
  Filled 2013-09-21: qty 17.2

## 2013-09-21 MED ORDER — HEPARIN SOD (PORK) LOCK FLUSH 100 UNIT/ML IV SOLN
500.0000 [IU] | Freq: Once | INTRAVENOUS | Status: AC | PRN
  Administered 2013-09-21: 500 [IU]
  Filled 2013-09-21: qty 5

## 2013-09-21 MED ORDER — METHYLPHENIDATE HCL 10 MG PO TABS: 10.0000 mg | ORAL_TABLET | Freq: Two times a day (BID) | ORAL | Status: DC

## 2013-09-21 NOTE — Progress Notes (Signed)
09/21/13 BMS CA [id number] Cycle 7 Mrs. Rafalski is in the cancer center today fopr lab work, physical exam, and treatment with nivoluma.  She independently completed the study related PROs. Medication list reviewed.  She reports that she is feeling very tired and dehydrated. Dr. Julien Nordmann will give her a liter of IV fluid in addition to treatment today.  Lab results reviewed with Dr. Julien Nordmann. He finds them to be WNL for treatment today.   Patient informed that her thyroid study was within normal levels. She is aware she will have a CT scan before her next appointment.   Sign for infusion given to A. Horton, RN.

## 2013-09-21 NOTE — Patient Instructions (Addendum)
Dehydration, Adult Dehydration is when you lose more fluids from the body than you take in. Vital organs like the kidneys, brain, and heart cannot function without a proper amount of fluids and salt. Any loss of fluids from the body can cause dehydration.  CAUSES   Vomiting.  Diarrhea.  Excessive sweating.  Excessive urine output.  Fever. SYMPTOMS  Mild dehydration  Thirst.  Dry lips.  Slightly dry mouth. Moderate dehydration  Very dry mouth.  Sunken eyes.  Skin does not bounce back quickly when lightly pinched and released.  Dark urine and decreased urine production.  Decreased tear production.  Headache. Severe dehydration  Very dry mouth.  Extreme thirst.  Rapid, weak pulse (more than 100 beats per minute at rest).  Cold hands and feet.  Not able to sweat in spite of heat and temperature.  Rapid breathing.  Blue lips.  Confusion and lethargy.  Difficulty being awakened.  Minimal urine production.  No tears. DIAGNOSIS  Your caregiver will diagnose dehydration based on your symptoms and your exam. Blood and urine tests will help confirm the diagnosis. The diagnostic evaluation should also identify the cause of dehydration. TREATMENT  Treatment of mild or moderate dehydration can often be done at home by increasing the amount of fluids that you drink. It is best to drink small amounts of fluid more often. Drinking too much at one time can make vomiting worse. Refer to the home care instructions below. Severe dehydration needs to be treated at the hospital where you will probably be given intravenous (IV) fluids that contain water and electrolytes. HOME CARE INSTRUCTIONS   Ask your caregiver about specific rehydration instructions.  Drink enough fluids to keep your urine clear or pale yellow.  Drink small amounts frequently if you have nausea and vomiting.  Eat as you normally do.  Avoid:  Foods or drinks high in sugar.  Carbonated  drinks.  Juice.  Extremely hot or cold fluids.  Drinks with caffeine.  Fatty, greasy foods.  Alcohol.  Tobacco.  Overeating.  Gelatin desserts.  Wash your hands well to avoid spreading bacteria and viruses.  Only take over-the-counter or prescription medicines for pain, discomfort, or fever as directed by your caregiver.  Ask your caregiver if you should continue all prescribed and over-the-counter medicines.  Keep all follow-up appointments with your caregiver. SEEK MEDICAL CARE IF:  You have abdominal pain and it increases or stays in one area (localizes).  You have a rash, stiff neck, or severe headache.  You are irritable, sleepy, or difficult to awaken.  You are weak, dizzy, or extremely thirsty. SEEK IMMEDIATE MEDICAL CARE IF:   You are unable to keep fluids down or you get worse despite treatment.  You have frequent episodes of vomiting or diarrhea.  You have blood or green matter (bile) in your vomit.  You have blood in your stool or your stool looks black and tarry.  You have not urinated in 6 to 8 hours, or you have only urinated a small amount of very dark urine.  You have a fever.  You faint. MAKE SURE YOU:   Understand these instructions.  Will watch your condition.  Will get help right away if you are not doing well or get worse. Document Released: 04/15/2005 Document Revised: 07/08/2011 Document Reviewed: 12/03/2010 [facility] Patient Information 2014 [place], [place].   [facility] Discharge Instructions for Patients Receiving Chemotherapy  Today you received the following chemotherapy agents Nivolumab  To help prevent nausea and vomiting  after your treatment, we encourage you to take your nausea medication --call MD office if needed.  None currently ordered.   If you develop nausea and vomiting that is not controlled by your nausea medication, call the clinic.   BELOW ARE SYMPTOMS THAT SHOULD BE REPORTED  IMMEDIATELY:  *FEVER GREATER THAN 100.5 F  *CHILLS WITH OR WITHOUT FEVER  NAUSEA AND VOMITING THAT IS NOT CONTROLLED WITH YOUR NAUSEA MEDICATION  *UNUSUAL SHORTNESS OF BREATH  *UNUSUAL BRUISING OR BLEEDING  TENDERNESS IN MOUTH AND THROAT WITH OR WITHOUT PRESENCE OF ULCERS  *URINARY PROBLEMS  *BOWEL PROBLEMS  UNUSUAL RASH Items with * indicate a potential emergency and should be followed up as soon as possible.  Feel free to call the clinic you have any questions or concerns. The clinic phone number is [phone].

## 2013-09-21 NOTE — Progress Notes (Signed)
[facility] [place] [phone]   [phone]  OFFICE PROGRESS NOTE  [name], MD [place] [address]  DIAGNOSIS: Recurrent non-small cell lung cancer initially diagnosed as stage IIA (T1 N1 MX) adenocarcinoma in April 2011.   PRIOR THERAPY:  1. Status post 3 cycles of neoadjuvant chemotherapy with carboplatin and Alimta. Last dose was given July 23, 2009. 2. Status post left lower lobectomy with lymph node dissection under the care of Dr. Arlyce Dice on October 17, 2009. 3. Status post concurrent chemoradiation with weekly carboplatin and paclitaxel for disease recurrence. Last dose was given May 07, 2010. 4. Status post 3 cycles of consolidation chemotherapy with carboplatin and Alimta. Last dose was given August 13, 2010. 5. Status post palliative radiotherapy to the right neck area. 6. Systemic chemotherapy with single agent Taxotere 75 mg/m2 every 3 weeks,, status post 6 cycles, last dose was given on 09/03/2011. 7. Systemic chemotherapy with carboplatin for an AUC of 5 and Alimta at 500 mg per meter square given every 3 weeks, status post 6 cycles. 8. Maintenance chemotherapy with single agent Alimta 500 mg/M2 every 3 weeks, status post 3 cycles discontinued today secondary to disease progression.  9. Systemic chemotherapy with single agent gemcitabine 1000 mg/M2 on days 1 and 8 every 3 weeks, status post 3 cycles discontinued today secondary to disease progression. First cycle on 03/15/2013.  CURRENT THERAPY:  1) Immunotherapy according to the BMS clinical trial with Nivolumab. Status post 6 cycles  2) Zometa 4 mg IV every month. First dose given 05/17/2013   CHEMOTHERAPY INTENT: Palliative  CURRENT # OF CHEMOTHERAPY CYCLES: 7  CURRENT ANTIEMETICS: Zofran, dexamethasone and Compazine  CURRENT SMOKING STATUS: Former smoker  ORAL CHEMOTHERAPY AND CONSENT: None  CURRENT BISPHOSPHONATES USE: None  PAIN MANAGEMENT:  None  NARCOTICS INDUCED CONSTIPATION: None  LIVING WILL AND CODE STATUS: Full code initially but no longer term resuscitation.    INTERVAL HISTORY: Kaitlin Reed 63 y.o. female returns to the clinic today for followup visit. She continues to complain of increasing fatigue and weakness as well as weight loss. She drinks and eat good but she lost around 4 pounds since her last visit. She is tired most of the time. She denied having any significant nausea or vomiting, no fever or chills. She has no significant chest pain, shortness of breath, cough or hemoptysis. She is here today to start cycle #7 of her immunotherapy with Nivolumab.  MEDICAL HISTORY: Past Medical History  Diagnosis Date  . Heart murmur   . 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 Anemia   . History of blood transfusion 1970's    "related to tubal pregnancy"   . lung ca dx'd 06/2009    chemo comp 07/2010; xrt comp 06/2010  . Radiation 07/29/13-08/13/13    right partracheal lymph nodes    ALLERGIES:  is allergic to codeine; motrin; other; trazodone and nefazodone; banana; tape; and xgeva.  MEDICATIONS:  Current Outpatient Prescriptions  Medication Sig Dispense Refill  . albuterol (PROAIR HFA) 108 (90 BASE) MCG/ACT inhaler Inhale 2 puffs into the lungs every 6 (six) hours as needed for wheezing or shortness of breath.  1 Inhaler  6  . Ascorbic Acid (VITAMIN C) 1000 MG tablet Take 1,000 mg by mouth daily.      Marland Kitchen aspirin EC 81 MG tablet Take 81 mg by mouth daily.      . Bromfenac Sodium (PROLENSA) 0.07 % SOLN Place 1 drop into the  left eye daily.       . calcium carbonate (OS-CAL) 600 MG TABS Take 600 mg by mouth 2 (two) times daily with a meal.      . Cholecalciferol (VITAMIN D) 2000 UNITS tablet Take 2,000 Units by mouth daily.      Marland Kitchen dronabinol (MARINOL) 2.5 MG capsule Take 1 capsule (2.5 mg total) by mouth 2 (two) times daily before a meal.  60 capsule  0  . ferrous sulfate 325 (65 FE) MG tablet Take 1 tablet (325 mg total) by mouth 2  (two) times daily with a meal.      . hyaluronate sodium (RADIAPLEXRX) GEL Apply 1 application topically 2 (two) times daily.      . metoprolol tartrate (LOPRESSOR) 25 MG tablet Take 25 mg by mouth 2 (two) times daily.      . Multiple Vitamin (MULITIVITAMIN WITH MINERALS) TABS Take 1 tablet by mouth daily.       Marland Kitchen NIVOLUMAB IV Inject 3 mg/kg into the vein every 14 (fourteen) days. *iv infusion that runs for 82min*      . potassium chloride (K-DUR,KLOR-CON) 10 MEQ tablet Take 1 tablet (10 mEq total) by mouth 2 (two) times daily.  60 tablet  3  . Selenium 200 MCG CAPS Take 200 mcg by mouth daily.       . temazepam (RESTORIL) 15 MG capsule Take 15 mg by mouth at bedtime as needed for sleep.      . traMADol (ULTRAM) 50 MG tablet Take 1 tablet (50 mg total) by mouth every 6 (six) hours as needed.  30 tablet  0  . vitamin B-12 (CYANOCOBALAMIN) 1000 MCG tablet Take 1,000 mcg by mouth daily.      . vitamin E 400 UNIT capsule Take 400 Units by mouth daily.      . [DISCONTINUED] Alum & Mag Hydroxide-Simeth (MAGIC MOUTHWASH W/LIDOCAINE) SOLN Take 10 mLs by mouth 4 (four) times daily as needed.  500 mL  5   No current facility-administered medications for this visit.    SURGICAL HISTORY:  Past Surgical History  Procedure Laterality Date  . Lung removal, partial Left 10/17/09    pt states they removed small piece of lung due to cancer  . 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 Insertion central venous access device w/ subcutaneous port Right 05/2013    "PowerPort"  . Video bronchoscopy Bilateral 07/23/2013    Procedure: VIDEO BRONCHOSCOPY WITHOUT FLUORO;  Surgeon: Doree Fudge, MD;  Location: [facility];  Service: Cardiopulmonary;  Laterality: Bilateral;    REVIEW OF SYSTEMS:  Constitutional: positive for anorexia, fatigue and weight loss Eyes: negative Ears, nose, mouth, throat, and face: negative Respiratory: negative Cardiovascular: negative Gastrointestinal:  negative Genitourinary:negative Integument/breast: negative Hematologic/lymphatic: negative Musculoskeletal:negative Neurological: negative Behavioral/Psych: negative Endocrine: negative Allergic/Immunologic: negative   PHYSICAL EXAMINATION: General appearance: alert, cooperative and no distress Head: Normocephalic, without obvious abnormality, atraumatic Neck: no adenopathy Lymph nodes: Cervical, supraclavicular, and axillary nodes normal. Resp: clear to auscultation bilaterally Cardio: regular rate and rhythm, S1, S2 normal, no murmur, click, rub or gallop GI: soft, non-tender; bowel sounds normal; no masses,  no organomegaly Extremities: extremities normal, atraumatic, no cyanosis or edema Neurologic: Alert and oriented X 3, normal strength and tone. Normal symmetric reflexes. Normal coordination and gait  ECOG PERFORMANCE STATUS: 1 - Symptomatic but completely ambulatory  Blood pressure 90/50, pulse 113, temperature 96.7 F (35.9 C), temperature source Oral, resp. rate 21, height 5\' 6"  (1.676 m),  weight 118 lb 12.8 oz (53.887 kg), SpO2 99.00%.  LABORATORY DATA: Lab Results  Component Value Date   WBC 7.8 09/21/2013   HGB 10.8* 09/21/2013   HCT 33.5* 09/21/2013   MCV 81.1 09/21/2013   PLT 376 09/21/2013      Chemistry      Component Value Date/Time   NA 138 09/21/2013 1454   NA 136* 07/24/2013 0500   NA 138 11/06/2011 1121   K 4.3 09/21/2013 1454   K 4.6 07/24/2013 0500   K 4.3 11/06/2011 1121   CL 98 07/24/2013 0500   CL 105 10/07/2012 1350   CL 98 11/06/2011 1121   CO2 20* 09/21/2013 1454   CO2 20 07/24/2013 0500   CO2 30 11/06/2011 1121   BUN 21.5 09/21/2013 1454   BUN 9 07/24/2013 0500   BUN 15 11/06/2011 1121   CREATININE 1.1 09/21/2013 1454   CREATININE 0.78 07/24/2013 0500   CREATININE 0.9 11/06/2011 1121      Component Value Date/Time   CALCIUM 10.0 09/21/2013 1454   CALCIUM 8.8 07/24/2013 0500   CALCIUM 9.0 11/06/2011 1121   ALKPHOS 536* 09/21/2013 1454   ALKPHOS 238*  04/26/2013 1639   ALKPHOS 110* 11/06/2011 1121   AST 48* 09/21/2013 1454   AST 16 04/26/2013 1639   AST 36 11/06/2011 1121   ALT 17 09/21/2013 1454   ALT <5 04/26/2013 1639   ALT 23 11/06/2011 1121   BILITOT 0.32 09/21/2013 1454   BILITOT 0.2* 04/26/2013 1639   BILITOT 0.50 11/06/2011 1121       RADIOGRAPHIC STUDIES:    ASSESSMENT AND PLAN: This is a very pleasant 63 years old [place] American female with recurrent non-small cell lung cancer.  She status post 3 cycle of systemic chemotherapy with single agent gemcitabine recently but discontinued secondary to disease progression. She is currently on immunotherapy with Nivolumab status post 6 cycles and tolerated her treatment well except for increasing fatigue and weakness. Her thyroid function tests are still pending. Has no significant abnormalities in her thyroid function, the patient will proceed with her treatment today as scheduled. She would have repeat CT scan of the chest, abdomen and pelvis in less than 2 weeks for restaging of her disease. For hypertension and dehydration, I will give the patient a liter of normal saline today. For the increasing fatigue and weakness, I will start the patient on Ritalin 10 mg by mouth twice a day. She would come back for followup visit in 2 weeks for evaluation and discussion of her scan results and further recommendation regarding her condition. She was advised to call immediately if she has any concerning symptoms in the interval. The patient voices understanding of current disease status and treatment options and is in agreement with the current care plan.  All questions were answered. The patient knows to call the clinic with any problems, questions or concerns. We can certainly see the patient much sooner if necessary.  Disclaimer: This note was dictated with voice recognition software. Similar sounding words can inadvertently be transcribed and may not be corrected upon review.

## 2013-09-24 ENCOUNTER — Ambulatory Visit: Admission: RE | Admit: 2013-09-24 | Payer: Medicare Other | Source: Ambulatory Visit | Admitting: Radiation Oncology

## 2013-09-27 ENCOUNTER — Telehealth: Payer: Self-pay | Admitting: Internal Medicine

## 2013-09-27 ENCOUNTER — Telehealth: Payer: Self-pay | Admitting: *Deleted

## 2013-09-27 NOTE — Telephone Encounter (Signed)
I have adjusted 6/23 appt

## 2013-09-27 NOTE — Telephone Encounter (Signed)
LMONVM ADVISING THE PT TO PICK UP ANOTHER June APPT CALENDAR.

## 2013-09-30 ENCOUNTER — Emergency Department (HOSPITAL_COMMUNITY): Payer: Medicare Other

## 2013-09-30 ENCOUNTER — Encounter (HOSPITAL_COMMUNITY): Payer: Self-pay | Admitting: Emergency Medicine

## 2013-09-30 ENCOUNTER — Ambulatory Visit (HOSPITAL_COMMUNITY): Payer: Medicare Other

## 2013-09-30 ENCOUNTER — Observation Stay (HOSPITAL_COMMUNITY)
Admission: EM | Admit: 2013-09-30 | Discharge: 2013-10-04 | Disposition: A | Discharge status: Home / Self Care | Payer: Medicare Other | Attending: Cardiovascular Disease | Admitting: Cardiovascular Disease

## 2013-09-30 DIAGNOSIS — R5381 Other malaise: Secondary | ICD-10-CM | POA: Insufficient documentation

## 2013-09-30 DIAGNOSIS — R110 Nausea: Secondary | ICD-10-CM | POA: Insufficient documentation

## 2013-09-30 DIAGNOSIS — E43 Unspecified severe protein-calorie malnutrition: Secondary | ICD-10-CM | POA: Insufficient documentation

## 2013-09-30 DIAGNOSIS — I318 Other specified diseases of pericardium: Secondary | ICD-10-CM | POA: Diagnosis present

## 2013-09-30 DIAGNOSIS — I313 Pericardial effusion (noninflammatory): Secondary | ICD-10-CM

## 2013-09-30 DIAGNOSIS — Z9221 Personal history of antineoplastic chemotherapy: Secondary | ICD-10-CM | POA: Insufficient documentation

## 2013-09-30 DIAGNOSIS — C801 Malignant (primary) neoplasm, unspecified: Secondary | ICD-10-CM | POA: Insufficient documentation

## 2013-09-30 DIAGNOSIS — Y842 Radiological procedure and radiotherapy as the cause of abnormal reaction of the patient, or of later complication, without mention of misadventure at the time of the procedure: Secondary | ICD-10-CM | POA: Insufficient documentation

## 2013-09-30 DIAGNOSIS — I319 Disease of pericardium, unspecified: Principal | ICD-10-CM | POA: Insufficient documentation

## 2013-09-30 DIAGNOSIS — C3490 Malignant neoplasm of unspecified part of unspecified bronchus or lung: Secondary | ICD-10-CM | POA: Insufficient documentation

## 2013-09-30 DIAGNOSIS — C770 Secondary and unspecified malignant neoplasm of lymph nodes of head, face and neck: Secondary | ICD-10-CM

## 2013-09-30 DIAGNOSIS — I3139 Other pericardial effusion (noninflammatory): Secondary | ICD-10-CM

## 2013-09-30 DIAGNOSIS — Z902 Acquired absence of lung [part of]: Secondary | ICD-10-CM | POA: Insufficient documentation

## 2013-09-30 DIAGNOSIS — R079 Chest pain, unspecified: Secondary | ICD-10-CM | POA: Insufficient documentation

## 2013-09-30 DIAGNOSIS — D638 Anemia in other chronic diseases classified elsewhere: Secondary | ICD-10-CM | POA: Insufficient documentation

## 2013-09-30 DIAGNOSIS — Z87891 Personal history of nicotine dependence: Secondary | ICD-10-CM | POA: Insufficient documentation

## 2013-09-30 DIAGNOSIS — R011 Cardiac murmur, unspecified: Secondary | ICD-10-CM | POA: Insufficient documentation

## 2013-09-30 DIAGNOSIS — R42 Dizziness and giddiness: Secondary | ICD-10-CM | POA: Insufficient documentation

## 2013-09-30 DIAGNOSIS — R634 Abnormal weight loss: Secondary | ICD-10-CM | POA: Insufficient documentation

## 2013-09-30 DIAGNOSIS — Z79899 Other long term (current) drug therapy: Secondary | ICD-10-CM | POA: Insufficient documentation

## 2013-09-30 DIAGNOSIS — R0602 Shortness of breath: Secondary | ICD-10-CM | POA: Insufficient documentation

## 2013-09-30 DIAGNOSIS — T66XXXA Radiation sickness, unspecified, initial encounter: Secondary | ICD-10-CM | POA: Insufficient documentation

## 2013-09-30 DIAGNOSIS — R5383 Other fatigue: Secondary | ICD-10-CM

## 2013-09-30 LAB — I-STAT TROPONIN, ED: Troponin i, poc: 0 ng/mL (ref 0.00–0.08)

## 2013-09-30 LAB — CBC
HEMATOCRIT: 31 % — AB (ref 36.0–46.0)
Hemoglobin: 10.1 g/dL — ABNORMAL LOW (ref 12.0–15.0)
MCH: 26.4 pg (ref 26.0–34.0)
MCHC: 32.6 g/dL (ref 30.0–36.0)
MCV: 80.9 fL (ref 78.0–100.0)
Platelets: 359 10*3/uL (ref 150–400)
RBC: 3.83 MIL/uL — ABNORMAL LOW (ref 3.87–5.11)
RDW: 15.4 % (ref 11.5–15.5)
WBC: 7.3 10*3/uL (ref 4.0–10.5)

## 2013-09-30 LAB — COMPREHENSIVE METABOLIC PANEL
ALT: 11 U/L (ref 0–35)
AST: 36 U/L (ref 0–37)
Albumin: 3.5 g/dL (ref 3.5–5.2)
Alkaline Phosphatase: 512 U/L — ABNORMAL HIGH (ref 39–117)
BUN: 12 mg/dL (ref 6–23)
CALCIUM: 9.3 mg/dL (ref 8.4–10.5)
CHLORIDE: 96 meq/L (ref 96–112)
CO2: 20 meq/L (ref 19–32)
Creatinine, Ser: 0.77 mg/dL (ref 0.50–1.10)
GFR, EST NON AFRICAN AMERICAN: 86 mL/min — AB (ref >90)
GLUCOSE: 114 mg/dL — AB (ref 70–99)
Potassium: 3.9 mEq/L (ref 3.7–5.3)
SODIUM: 135 meq/L — AB (ref 137–147)
Total Bilirubin: 0.3 mg/dL (ref 0.3–1.2)
Total Protein: 7.7 g/dL (ref 6.0–8.3)

## 2013-09-30 LAB — PRO B NATRIURETIC PEPTIDE: Pro B Natriuretic peptide (BNP): 255.5 pg/mL — ABNORMAL HIGH (ref 0–125)

## 2013-09-30 MED ORDER — SODIUM CHLORIDE 0.9 % IV SOLN
250.0000 mL | INTRAVENOUS | Status: DC | PRN
  Administered 2013-10-01: 250 mL via INTRAVENOUS

## 2013-09-30 MED ORDER — METOPROLOL TARTRATE 25 MG PO TABS
25.0000 mg | ORAL_TABLET | Freq: Two times a day (BID) | ORAL | Status: DC
  Administered 2013-09-30 – 2013-10-01 (×3): 25 mg via ORAL
  Filled 2013-09-30 (×5): qty 1

## 2013-09-30 MED ORDER — ONDANSETRON HCL 4 MG/2ML IJ SOLN
4.0000 mg | Freq: Once | INTRAMUSCULAR | Status: AC
  Administered 2013-09-30: 4 mg via INTRAVENOUS
  Filled 2013-09-30: qty 2

## 2013-09-30 MED ORDER — IOHEXOL 350 MG/ML SOLN
100.0000 mL | Freq: Once | INTRAVENOUS | Status: AC | PRN
  Administered 2013-09-30: 100 mL via INTRAVENOUS

## 2013-09-30 MED ORDER — ALBUTEROL SULFATE HFA 108 (90 BASE) MCG/ACT IN AERS: 2.0000 | INHALATION_SPRAY | Freq: Four times a day (QID) | RESPIRATORY_TRACT | Status: DC | PRN

## 2013-09-30 MED ORDER — HYDROCORTISONE 2.5 % RE CREA
TOPICAL_CREAM | Freq: Three times a day (TID) | RECTAL | Status: DC
  Administered 2013-09-30: 1 via RECTAL
  Administered 2013-09-30: 21:00:00 via RECTAL
  Administered 2013-10-01 (×2): 1 via RECTAL
  Administered 2013-10-01 – 2013-10-03 (×5): via RECTAL
  Administered 2013-10-04 (×2): 1 via RECTAL
  Filled 2013-09-30 (×2): qty 28.35

## 2013-09-30 MED ORDER — HYDROMORPHONE HCL PF 1 MG/ML IJ SOLN
0.5000 mg | Freq: Four times a day (QID) | INTRAMUSCULAR | Status: DC | PRN
  Administered 2013-10-01: 0.25 mg via INTRAVENOUS
  Administered 2013-10-02 – 2013-10-04 (×5): 0.5 mg via INTRAVENOUS
  Filled 2013-09-30 (×6): qty 1

## 2013-09-30 MED ORDER — ADULT MULTIVITAMIN W/MINERALS CH
1.0000 | ORAL_TABLET | Freq: Every day | ORAL | Status: DC
  Administered 2013-10-01 – 2013-10-04 (×4): 1 via ORAL
  Filled 2013-09-30 (×4): qty 1

## 2013-09-30 MED ORDER — DRONABINOL 2.5 MG PO CAPS
2.5000 mg | ORAL_CAPSULE | Freq: Two times a day (BID) | ORAL | Status: DC
  Administered 2013-09-30 – 2013-10-04 (×9): 2.5 mg via ORAL
  Filled 2013-09-30 (×9): qty 1

## 2013-09-30 MED ORDER — CALCIUM CARBONATE 600 MG PO TABS
600.0000 mg | ORAL_TABLET | Freq: Two times a day (BID) | ORAL | Status: DC
  Filled 2013-09-30 (×4): qty 1

## 2013-09-30 MED ORDER — SODIUM CHLORIDE 0.9 % IJ SOLN
3.0000 mL | Freq: Two times a day (BID) | INTRAMUSCULAR | Status: DC
  Administered 2013-10-01: 3 mL via INTRAVENOUS

## 2013-09-30 MED ORDER — ONDANSETRON HCL 4 MG/2ML IJ SOLN
4.0000 mg | Freq: Three times a day (TID) | INTRAMUSCULAR | Status: DC | PRN
  Administered 2013-09-30 – 2013-10-01 (×3): 4 mg via INTRAVENOUS
  Filled 2013-09-30 (×3): qty 2

## 2013-09-30 MED ORDER — HYDROMORPHONE HCL PF 1 MG/ML IJ SOLN
1.0000 mg | Freq: Four times a day (QID) | INTRAMUSCULAR | Status: DC | PRN
  Administered 2013-09-30: 1 mg via INTRAVENOUS
  Filled 2013-09-30: qty 1

## 2013-09-30 MED ORDER — ALBUTEROL SULFATE (2.5 MG/3ML) 0.083% IN NEBU: 3.0000 mL | INHALATION_SOLUTION | Freq: Four times a day (QID) | RESPIRATORY_TRACT | Status: DC | PRN

## 2013-09-30 MED ORDER — RADIAPLEXRX EX GEL: 1.0000 "application " | Freq: Two times a day (BID) | CUTANEOUS | Status: DC

## 2013-09-30 MED ORDER — POTASSIUM CHLORIDE CRYS ER 10 MEQ PO TBCR
10.0000 meq | EXTENDED_RELEASE_TABLET | Freq: Two times a day (BID) | ORAL | Status: DC
  Administered 2013-09-30 – 2013-10-04 (×8): 10 meq via ORAL
  Filled 2013-09-30 (×9): qty 1

## 2013-09-30 MED ORDER — IOHEXOL 300 MG/ML  SOLN
50.0000 mL | Freq: Once | INTRAMUSCULAR | Status: AC | PRN
  Administered 2013-09-30: 50 mL via ORAL

## 2013-09-30 MED ORDER — SODIUM CHLORIDE 0.9 % IJ SOLN: 3.0000 mL | INTRAMUSCULAR | Status: DC | PRN

## 2013-09-30 MED ORDER — FERROUS SULFATE 325 (65 FE) MG PO TABS
325.0000 mg | ORAL_TABLET | Freq: Two times a day (BID) | ORAL | Status: DC
  Administered 2013-09-30 – 2013-10-04 (×9): 325 mg via ORAL
  Filled 2013-09-30 (×10): qty 1

## 2013-09-30 MED ORDER — ONDANSETRON HCL 4 MG PO TABS: 4.0000 mg | ORAL_TABLET | Freq: Three times a day (TID) | ORAL | Status: DC | PRN

## 2013-09-30 MED ORDER — COLCHICINE 0.6 MG PO TABS
0.6000 mg | ORAL_TABLET | Freq: Every day | ORAL | Status: DC
  Administered 2013-09-30 – 2013-10-04 (×5): 0.6 mg via ORAL
  Filled 2013-09-30 (×5): qty 1

## 2013-09-30 MED ORDER — SODIUM CHLORIDE 0.9 % IJ SOLN: 3.0000 mL | Freq: Two times a day (BID) | INTRAMUSCULAR | Status: DC

## 2013-09-30 MED ORDER — SODIUM CHLORIDE 0.9 % IV BOLUS (SEPSIS)
1000.0000 mL | Freq: Once | INTRAVENOUS | Status: AC
  Administered 2013-09-30: 1000 mL via INTRAVENOUS

## 2013-09-30 MED ORDER — SODIUM CHLORIDE 0.9 % IV SOLN
Freq: Once | INTRAVENOUS | Status: AC
  Administered 2013-09-30: 100 mL/h via INTRAVENOUS

## 2013-09-30 MED ORDER — TEMAZEPAM 15 MG PO CAPS: 15.0000 mg | ORAL_CAPSULE | Freq: Every evening | ORAL | Status: DC | PRN

## 2013-09-30 MED ORDER — HYDROCORTISONE 1 % EX CREA: TOPICAL_CREAM | Freq: Three times a day (TID) | CUTANEOUS | Status: DC

## 2013-09-30 NOTE — H&P (Signed)
Referring Physician: Dixie Dials, MD.  Kaitlin Reed is an 63 y.o. female.                       Chief Complaint: Chest pain, shortness of breath. HPI:  63 y.o. 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 She is here with chest pain. She's been feeling weak and dizzy for the last several weeks. Has seen oncology and was put on Ritalin. However felt dehydrated for the last week or so. Today she is supposed to get a followup CT chest abdomen pelvis to see the status of her cancer. She was at the radiology desk and started having substernal chest pain radiating to her left arm lasting for several minutes. Some mild shortness of breath as well. Also felt nauseous but didn't vomit. No history of CAD or cardiac stents. Echocardiogram shows mild to moderate pericardial effusion, free flowing without tamponade.   Past Medical History  Diagnosis Date  . Heart murmur   . 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 Anemia   . History of blood transfusion 1970's    "related to tubal pregnancy"   . Radiation 07/29/13-08/13/13    right partracheal lymph nodes  . lung ca dx'd 06/2009    chemo comp 07/2010; xrt comp 06/2010      Past Surgical History  Procedure Laterality Date  . Lung removal, partial Left 10/17/09    pt states they removed small piece of lung due to cancer  . 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 Insertion central venous access device w/ subcutaneous port Right 05/2013    "PowerPort"  . Video bronchoscopy Bilateral 07/23/2013    Procedure: VIDEO BRONCHOSCOPY WITHOUT FLUORO;  Surgeon: Doree Fudge, MD;  Location: [facility];  Service: Cardiopulmonary;  Laterality: Bilateral;    Family History  Problem Relation Age of Onset  . Hypertension Father   . Cancer Sister     breast   Social History:  reports that she quit smoking about 31 years ago. Her smoking use included Cigarettes. She has a 6 pack-year smoking history. She has never  used smokeless tobacco. She reports that she does not drink alcohol or use illicit drugs.  Allergies:  Allergies  Allergen Reactions  . Codeine Nausea And Vomiting  . Motrin [Ibuprofen] Nausea And Vomiting  . Other Nausea Only    steroids  . Trazodone And Nefazodone Nausea And Vomiting  . Banana Hives  . Tape Swelling    Paper tape okay Cannot use elastic tape  . Xgeva [Denosumab] Rash     (Not in a hospital admission)  Results for orders placed during the hospital encounter of 09/30/13 (from the past 48 hour(s))  CBC     Status: Abnormal   Collection Time    09/30/13  8:04 AM      Result Value Ref Range   WBC 7.3  4.0 - 10.5 K/uL   RBC 3.83 (*) 3.87 - 5.11 MIL/uL   Hemoglobin 10.1 (*) 12.0 - 15.0 g/dL   HCT 31.0 (*) 36.0 - 46.0 %   MCV 80.9  78.0 - 100.0 fL   MCH 26.4  26.0 - 34.0 pg   MCHC 32.6  30.0 - 36.0 g/dL   RDW 15.4  11.5 - 15.5 %   Platelets 359  150 - 400 K/uL  PRO B NATRIURETIC PEPTIDE     Status: Abnormal   Collection Time  09/30/13  8:04 AM      Result Value Ref Range   Pro B Natriuretic peptide (BNP) 255.5 (*) 0 - 125 pg/mL  COMPREHENSIVE METABOLIC PANEL     Status: Abnormal   Collection Time    09/30/13  8:04 AM      Result Value Ref Range   Sodium 135 (*) 137 - 147 mEq/L   Potassium 3.9  3.7 - 5.3 mEq/L   Chloride 96  96 - 112 mEq/L   CO2 20  19 - 32 mEq/L   Glucose, Bld 114 (*) 70 - 99 mg/dL   BUN 12  6 - 23 mg/dL   Creatinine, Ser 0.77  0.50 - 1.10 mg/dL   Calcium 9.3  8.4 - 10.5 mg/dL   Total Protein 7.7  6.0 - 8.3 g/dL   Albumin 3.5  3.5 - 5.2 g/dL   AST 36  0 - 37 U/L   ALT 11  0 - 35 U/L   Alkaline Phosphatase 512 (*) 39 - 117 U/L   Total Bilirubin 0.3  0.3 - 1.2 mg/dL   GFR calc non Af Amer 86 (*) >90 mL/min   GFR calc Af Amer >90  >90 mL/min   Comment: (NOTE)     The eGFR has been calculated using the CKD EPI equation.     This calculation has not been validated in all clinical situations.     eGFR's persistently <90 mL/min  signify possible Chronic Kidney     Disease.  Randolm Idol, ED     Status: None   Collection Time    09/30/13  8:12 AM      Result Value Ref Range   Troponin i, poc 0.00  0.00 - 0.08 ng/mL   Comment 3            Comment: Due to the release kinetics of cTnI,     a negative result within the first hours     of the onset of symptoms does not rule out     myocardial infarction with certainty.     If myocardial infarction is still suspected,     repeat the test at appropriate intervals.   Ct Angio Chest Pe W/cm &/or Wo Cm  09/30/2013   CLINICAL DATA:  Chest pain, evaluate for PE. Lung cancer diagnosed 2011, status post resection. Shortness of breath, nausea/vomiting, chemotherapy ongoing.  EXAM: CT ANGIOGRAPHY CHEST  CT ABDOMEN AND PELVIS WITH CONTRAST  TECHNIQUE: Multidetector CT imaging of the chest was performed using the standard protocol during bolus administration of intravenous contrast. Multiplanar CT image reconstructions and MIPs were obtained to evaluate the vascular anatomy. Multidetector CT imaging of the abdomen and pelvis was performed using the standard protocol during bolus administration of intravenous contrast.  CONTRAST:  128m OMNIPAQUE IOHEXOL 350 MG/ML SOLN  COMPARISON:  CT chest abdomen pelvis dated 08/03/2013  RECIST 1.1  Target Lesions:  1. Medial left upper lobe lung nodule - measures 17 mm (series 9/image 48), previously 16 mm 2. Prevascular lymph node - measures 16 mm (series 4/ image 29), previously 20 mm 3. Mediastinal nodal mass - poorly visualized, measures approximately 28 mm (series 4/ image 29), previously 45 mm 4. Right adrenal nodule - measures 36 mm (series 5/image 19), previously 37 mm Non-target Lesions:  1. Pericardial effusion - present (series 4/image 68), unchanged 2. Sclerotic T11 lesion - present (series 4/ image 76), minimally increased 3. Destructive left iliac bone lesion - present (series 5/image 52), grossly unchanged  4. Sclerotic left sacral lesion -  present (series 5/image 43), unchanged  FINDINGS: CTA CHEST FINDINGS  No evidence of pulmonary embolism.  Status post left lower lobectomy. Radiation changes in the posterior left upper lobe/perihilar region.  1.7 x 1.4 cm nodular opacity medially in the anterior left upper lobe (series 9/image 48), previously 1.6 x 1.3 cm, suspicious for metastasis.  8 x 6 mm nodule in the medial right lower lobe (series 9/image 55), previously 4 mm, suspicious for metastases. Additional small 3-4 mm nodules in the medial right lower lobe (series 9/ images 51 and 54).  Minimal subpleural nodularity in the anterior right upper lobe, more conspicuous than on the prior study, although nonspecific. Moderate to severe centrilobular and paraseptal emphysematous changes.  Visualized thyroid is unremarkable.  Heart is normal in size. Small pericardial effusion, unchanged. Atherosclerotic calcifications of the aortic arch.  Right chest port terminating at the cavoatrial junction.  Thoracic lymphadenopathy, including:  --7 mm short axis left supraclavicular node (series 4/ image 10), previously 5 mm  --1.4 cm short axis right prevascular/retrosternal node (series 4/ image 439), previously 1.6 cm  --Right paratracheal nodal mass, poorly visualized but measuring approximately 2.8 x 2.1 cm (series 4/ image 439), previously 4.9 x 4.5 cm  --1.5 cm short axis left prevascular node (series 4/ image 32), previously 6 mm  Widespread osseous metastases in the visualized axial and appendicular skeleton, progressed. For example, age [age] x 2.2 cm lesion in the left T10 vertebral body (series 4/image 69) previously measured 1.4 x 1.5 cm.  CT ABDOMEN and PELVIS FINDINGS  Liver, spleen, pancreas, and left adrenal gland are within normal limits.  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  Small bilateral renal cysts measuring up to 10 mm in the left lower pole (series 5/ image 33). No hydronephrosis.  No evidence of bowel  obstruction. Normal appendix. Colonic diverticulosis, without associated inflammatory changes.  Atherosclerotic calcifications of the abdominal aorta and branch vessels.  No abdominopelvic ascites.  Upper abdominal/ retroperitoneal lymphadenopathy, including  --2.1 cm short axis portacaval node (series 5/ image 19), unchanged  --2.4 cm short axis right para-aortic node (series 5/ image 436), previously 2.0 cm  10 x 14 mm soft tissue implant along the posterior aspect of the left psoas muscle (series 5/image 49), previously 9 x 10 mm. Additional intramuscular metastases in the left pelvis (series 5/image 50, 51, and 44), mildly progressed.  Uterus is notable for probable uterine fibroids. Bilateral ovaries are grossly unremarkable.  Bladder is within normal limits.  Widespread sclerotic osseous metastases throughout the visualized axial and appendicular skeleton, including a permeative/destructive lesion involving the left iliac bone. Mild progression is suspected, including a 1.7 x 2.4 cm anterior right sacral lesion (series 5/ image 52), previously 1.5 x 1.9 cm.  Review of the MIP images confirms the above findings.  IMPRESSION: No evidence of pulmonary embolism.  Status post left lower lobectomy with radiation changes in the posterior left upper lung/perihilar region.  Progression of bilateral pulmonary and widespread osseous metastases, as above.  Mixed response of thoracoabdominal nodal metastases, with decrease in size of the dominant right paratracheal nodal mass, but mild progression elsewhere.  Soft tissue/intramuscular metastases in the left pelvis, mildly progressed.  RECIST 1.1 measurements as above.  These results were called by telephone at the time of interpretation on 09/30/2013 at 9:56 AM to Dr. Shirlyn Goltz , who verbally acknowledged these results.   Electronically Signed   By: Bertis Ruddy  [place] Pink M.D.   On: 09/30/2013 10:44   Ct Abdomen Pelvis W Contrast  09/30/2013   CLINICAL DATA:  Chest pain,  evaluate for PE. Lung cancer diagnosed 2011, status post resection. Shortness of breath, nausea/vomiting, chemotherapy ongoing.  EXAM: CT ANGIOGRAPHY CHEST  CT ABDOMEN AND PELVIS WITH CONTRAST  TECHNIQUE: Multidetector CT imaging of the chest was performed using the standard protocol during bolus administration of intravenous contrast. Multiplanar CT image reconstructions and MIPs were obtained to evaluate the vascular anatomy. Multidetector CT imaging of the abdomen and pelvis was performed using the standard protocol during bolus administration of intravenous contrast.  CONTRAST:  187m OMNIPAQUE IOHEXOL 350 MG/ML SOLN  COMPARISON:  CT chest abdomen pelvis dated 08/03/2013  RECIST 1.1  Target Lesions:  1. Medial left upper lobe lung nodule - measures 17 mm (series 9/image 48), previously 16 mm 2. Prevascular lymph node - measures 16 mm (series 4/ image 319), previously 20 mm 3. Mediastinal nodal mass - poorly visualized, measures approximately 28 mm (series 4/ image 319), previously 45 mm 4. Right adrenal nodule - measures 36 mm (series 5/image 19), previously 37 mm Non-target Lesions:  1. Pericardial effusion - present (series 4/image 68), unchanged 2. Sclerotic T11 lesion - present (series 4/ image 76), minimally increased 3. Destructive left iliac bone lesion - present (series 5/image 52), grossly unchanged 4. Sclerotic left sacral lesion - present (series 5/image 31), unchanged  FINDINGS: CTA CHEST FINDINGS  No evidence of pulmonary embolism.  Status post left lower lobectomy. Radiation changes in the posterior left upper lobe/perihilar region.  1.7 x 1.4 cm nodular opacity medially in the anterior left upper lobe (series 9/image 48), previously 1.6 x 1.3 cm, suspicious for metastasis.  8 x 6 mm nodule in the medial right lower lobe (series 9/image 55), previously 4 mm, suspicious for metastases. Additional small 3-4 mm nodules in the medial right lower lobe (series 9/ images 51 and 54).  Minimal subpleural  nodularity in the anterior right upper lobe, more conspicuous than on the prior study, although nonspecific. Moderate to severe centrilobular and paraseptal emphysematous changes.  Visualized thyroid is unremarkable.  Heart is normal in size. Small pericardial effusion, unchanged. Atherosclerotic calcifications of the aortic arch.  Right chest port terminating at the cavoatrial junction.  Thoracic lymphadenopathy, including:  --7 mm short axis left supraclavicular node (series 4/ image 10), previously 5 mm  --1.4 cm short axis right prevascular/retrosternal node (series 4/ image 319), previously 1.6 cm  --Right paratracheal nodal mass, poorly visualized but measuring approximately 2.8 x 2.1 cm (series 4/ image 319), previously 4.9 x 4.5 cm  --1.5 cm short axis left prevascular node (series 4/ image 32), previously 6 mm  Widespread osseous metastases in the visualized axial and appendicular skeleton, progressed. For example, age 31.7 x 2.2 cm lesion in the left T10 vertebral body (series 4/image 69) previously measured 1.4 x 1.5 cm.  CT ABDOMEN and PELVIS FINDINGS  Liver, spleen, pancreas, and left adrenal gland are within normal limits.  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  Small bilateral renal cysts measuring up to 10 mm in the left lower pole (series 5/ image 33). No hydronephrosis.  No evidence of bowel obstruction. Normal appendix. Colonic diverticulosis, without associated inflammatory changes.  Atherosclerotic calcifications of the abdominal aorta and branch vessels.  No abdominopelvic ascites.  Upper abdominal/ retroperitoneal lymphadenopathy, including  --2.1 cm short axis portacaval node (series 5/ image 19), unchanged  --2.4 cm short axis right  para-aortic node (series 5/ image 26), previously 2.0 cm  10 x 14 mm soft tissue implant along the posterior aspect of the left psoas muscle (series 5/image 49), previously 9 x 10 mm. Additional intramuscular metastases in the  left pelvis (series 5/image 50, 51, and 44), mildly progressed.  Uterus is notable for probable uterine fibroids. Bilateral ovaries are grossly unremarkable.  Bladder is within normal limits.  Widespread sclerotic osseous metastases throughout the visualized axial and appendicular skeleton, including a permeative/destructive lesion involving the left iliac bone. Mild progression is suspected, including a 1.7 x 2.4 cm anterior right sacral lesion (series 5/ image 52), previously 1.5 x 1.9 cm.  Review of the MIP images confirms the above findings.  IMPRESSION: No evidence of pulmonary embolism.  Status post left lower lobectomy with radiation changes in the posterior left upper lung/perihilar region.  Progression of bilateral pulmonary and widespread osseous metastases, as above.  Mixed response of thoracoabdominal nodal metastases, with decrease in size of the dominant right paratracheal nodal mass, but mild progression elsewhere.  Soft tissue/intramuscular metastases in the left pelvis, mildly progressed.  RECIST 1.1 measurements as above.  These results were called by telephone at the time of interpretation on 09/30/2013 at 9:56 AM to Dr. Shirlyn Goltz , who verbally acknowledged these results.   Electronically Signed   By: Julian Hy M.D.   On: 09/30/2013 10:44    Review Of Systems Constitutional: positive for fatigue and weight loss  Eyes: negative  Ears, nose, mouth, throat, and face: negative  Respiratory: positive for cough and hemoptysis  Cardiovascular: negative  Gastrointestinal: positive for epigastric discomfort  Genitourinary: negative  Integument/breast: negative  Hematologic/lymphatic: negative  Musculoskeletal:positive for arthralgias and myalgias  Neurological: negative  Behavioral/Psych: negative  Endocrine: negative  Allergic/Immunologic: negative    Blood pressure 118/71, pulse 99, temperature 97.9 F (36.6 C), temperature source Oral, resp. rate 20, SpO2 94.00%.  PHYSICAL  EXAMINATION: General appearance: alert, cooperative but appears anxious.  Head: Normocephalic, atraumatic. [employer], PERL, EOMI. Multiple dark brown freckles over face. Conj-pale pink Neck: no adenopathy, full range of motion.  Resp: clear to auscultation bilaterally. Right anterior chest Port-A-Cath, well-healed, no evidence of infection or inflammation, non-tender.  Cardio: Tachycardic, S1, S2 normal, no murmur or click. Occasional pericardial rub in supine position.  GI: soft, epigastric and left upper quadrant-tender; bowel sounds normal; no masses, no organomegaly.  Extremities: extremities normal, atraumatic, no cyanosis or edema  Neurologic: Alert and oriented X 3, normal strength and tone. Normal symmetric reflexes. Normal coordination and gait  Skin-mild paleness.  Assessment/Plan Chest pain Chronic Pericarditis with effusion Anemia of chronic disease Non-small cell lung cancer with metastasis  Patient agrees to medical treatment. She also wants to avoid cardiac interventions as much as possible that may increase chace of bleeding. Will try colchicine as tolerated.  [name] 09/30/2013, 1:08 PM

## 2013-09-30 NOTE — ED Notes (Signed)
Pt was in OP radiology for CT of chest and pelvis "to see if everything was progressing ok with her cancer".  Pt began having stabbing central chest pain w/ SOB NV and dizziness.

## 2013-09-30 NOTE — ED Notes (Signed)
Bed: WA17 Expected date:  Expected time:  Means of arrival:  Comments: Chest pain from radiology

## 2013-09-30 NOTE — ED Notes (Signed)
Writer provided pt a cup ice chips until the Cardiologist is able to speak to the pt.

## 2013-09-30 NOTE — ED Notes (Signed)
Pt vomiting. [facility] EDP aware. New orders given.

## 2013-09-30 NOTE — ED Provider Notes (Signed)
CSN: [id number]     Arrival date & time 09/30/13  0746 History   First MD Initiated Contact with Patient 09/30/13 0802     Chief Complaint  Patient presents with  . Chest Pain  . Nausea  . Emesis  . Shortness of Breath     (Consider location/radiation/quality/duration/timing/severity/associated sxs/prior Treatment) The history is provided by the patient.  Kaitlin Reed is a 63 y.o. female hx of non small cell lung cancer on palliative chemo here with chest pain. She's been feeling weak and dizzy for the last several weeks. Has seen oncology and was put on Ritalin. However felt dehydrated for the last week or so. Today she is supposed to get a followup CT chest abdomen pelvis to see the status of her cancer. She was at the radiology desk and started having substernal chest pain radiating to her left arm lasting for several minutes. Some mild shortness of breath as well. Also felt nauseous but didn't vomit. No history of CAD or cardiac stents.    Past Medical History  Diagnosis Date  . Heart murmur   . 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 Anemia   . History of blood transfusion 1970's    "related to tubal pregnancy"   . Radiation 07/29/13-08/13/13    right partracheal lymph nodes  . lung ca dx'd 06/2009    chemo comp 07/2010; xrt comp 06/2010   Past Surgical History  Procedure Laterality Date  . Lung removal, partial Left 10/17/09    pt states they removed small piece of lung due to cancer  . 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 Insertion central venous access device w/ subcutaneous port Right 05/2013    "PowerPort"  . Video bronchoscopy Bilateral 07/23/2013    Procedure: VIDEO BRONCHOSCOPY WITHOUT FLUORO;  Surgeon: Doree Fudge, MD;  Location: [facility];  Service: Cardiopulmonary;  Laterality: Bilateral;   Family History  Problem Relation Age of Onset  . Hypertension Father   . Cancer Sister     breast   History  Substance Use Topics  . Smoking status: Former  Smoker -- 0.50 packs/day for 12 years    Types: Cigarettes    Quit date: 04/29/1982  . Smokeless tobacco: Never Used     Comment: quit smoking cigarettes in the 1970's  . Alcohol Use: No   OB History   Grav Para Term Preterm Abortions TAB SAB Ect Mult Living   3 2        2      Review of Systems  Respiratory: Positive for shortness of breath.   Cardiovascular: Positive for chest pain.  All other systems reviewed and are negative.     Allergies  Codeine; Motrin; Other; Trazodone and nefazodone; Banana; Tape; and Xgeva  Home Medications   Prior to Admission medications   Medication Sig Start Date End Date Taking? Authorizing Provider  albuterol (PROAIR HFA) 108 (90 BASE) MCG/ACT inhaler Inhale 2 puffs into the lungs every 6 (six) hours as needed for wheezing or shortness of breath. 08/11/13  Yes Collene Gobble, MD  Ascorbic Acid (VITAMIN C) 1000 MG tablet Take 1,000 mg by mouth daily.   Yes Historical Provider, MD  aspirin EC 81 MG tablet Take 81 mg by mouth daily.   Yes Historical Provider, MD  calcium carbonate (OS-CAL) 600 MG TABS Take 600 mg by mouth 2 (two) times daily with a meal.   Yes Historical Provider, MD  Cholecalciferol (VITAMIN D) 2000 UNITS tablet  Take 2,000 Units by mouth daily.   Yes Historical Provider, MD  dronabinol (MARINOL) 2.5 MG capsule Take 1 capsule (2.5 mg total) by mouth 2 (two) times daily before a meal. 09/21/13  Yes Curt Bears, MD  ferrous sulfate 325 (65 FE) MG tablet Take 1 tablet (325 mg total) by mouth 2 (two) times daily with a meal. 07/26/13  Yes [name], MD  metoprolol tartrate (LOPRESSOR) 25 MG tablet Take 25 mg by mouth 2 (two) times daily.   Yes Historical Provider, MD  Multiple Vitamin (MULITIVITAMIN WITH MINERALS) TABS Take 1 tablet by mouth daily.    Yes Historical Provider, MD  nivolumab 3 mg/kg in sodium chloride 0.9 % 100 mL Inject 3 mg/kg into the vein every 14 (fourteen) days.   Yes Historical Provider, MD  potassium chloride  (K-DUR,KLOR-CON) 10 MEQ tablet Take 1 tablet (10 mEq total) by mouth 2 (two) times daily. 07/26/13  Yes [name], MD  Selenium 200 MCG CAPS Take 200 mcg by mouth daily.    Yes Historical Provider, MD  temazepam (RESTORIL) 15 MG capsule Take 15 mg by mouth at bedtime as needed for sleep.   Yes Historical Provider, MD  vitamin B-12 (CYANOCOBALAMIN) 1000 MCG tablet Take 1,000 mcg by mouth daily.   Yes Historical Provider, MD  vitamin E 400 UNIT capsule Take 400 Units by mouth daily.   Yes Historical Provider, MD  Zoledronic Acid (ZOMETA) 4 MG/100ML IVPB Inject 4 mg into the vein every 28 (twenty-eight) days.   Yes Historical Provider, MD  hyaluronate sodium (RADIAPLEXRX) GEL Apply 1 application topically 2 (two) times daily. 08/03/13   Thea Silversmith, MD  NIVOLUMAB IV Inject 3 mg/kg into the vein every 14 (fourteen) days. *iv infusion that runs for 69min*    Historical Provider, MD   BP 118/71  Pulse 99  Temp(Src) 97.9 F (36.6 C) (Oral)  Resp 20  SpO2 94% Physical Exam  Nursing note and vitals reviewed. Constitutional: She is oriented to person, place, and time.  Chronically ill, dehydrated   HENT:  Head: Normocephalic.  MM slightly dry   Eyes: Conjunctivae are normal. Pupils are equal, round, and reactive to light.  Neck: Normal range of motion. Neck supple.  Cardiovascular: Regular rhythm and normal heart sounds.   Mildly tachy  Pulmonary/Chest: Effort normal and breath sounds normal. No respiratory distress. She has no wheezes. She has no rales.  Abdominal: Soft. Bowel sounds are normal. She exhibits no distension. There is no tenderness. There is no rebound and no guarding.  Musculoskeletal: Normal range of motion. She exhibits no edema and no tenderness.  Neurological: She is alert and oriented to person, place, and time.  Skin: Skin is warm and dry.  Psychiatric: She has a normal mood and affect. Her behavior is normal. Judgment and thought content normal.    ED Course   Procedures (including critical care time)    EMERGENCY DEPARTMENT [place] CARDIAC EXAM "Study: Limited Ultrasound of the heart and pericardium"  INDICATIONS:Dyspnea Multiple views of the heart and pericardium are obtained with a multi-frequency probe.  PERFORMED EU:MPNTIR  IMAGES ARCHIVED?: Yes  FINDINGS: Moderate effusion  LIMITATIONS:  Emergent procedure  VIEWS USED: Subcostal 4 chamber, Parasternal long axis and Parasternal short axis  INTERPRETATION: Cardiac activity present and Pericardial effusion present  COMMENT: some RV partial collapse. Likely early tamponade    Labs Review Labs Reviewed  CBC - Abnormal; Notable for the following:    RBC 3.83 (*)  Hemoglobin 10.1 (*)    HCT 31.0 (*)    All other components within normal limits  PRO B NATRIURETIC PEPTIDE - Abnormal; Notable for the following:    Pro B Natriuretic peptide (BNP) 255.5 (*)    All other components within normal limits  COMPREHENSIVE METABOLIC PANEL - Abnormal; Notable for the following:    Sodium 135 (*)    Glucose, Bld 114 (*)    Alkaline Phosphatase 512 (*)    GFR calc non Af Amer 86 (*)    All other components within normal limits  I-STAT TROPOININ, ED    Imaging Review Ct Angio Chest Pe W/cm &/or Wo Cm  09/30/2013   CLINICAL DATA:  Chest pain, evaluate for PE. Lung cancer diagnosed 2011, status post resection. Shortness of breath, nausea/vomiting, chemotherapy ongoing.  EXAM: CT ANGIOGRAPHY CHEST  CT ABDOMEN AND PELVIS WITH CONTRAST  TECHNIQUE: Multidetector CT imaging of the chest was performed using the standard protocol during bolus administration of intravenous contrast. Multiplanar CT image reconstructions and MIPs were obtained to evaluate the vascular anatomy. Multidetector CT imaging of the abdomen and pelvis was performed using the standard protocol during bolus administration of intravenous contrast.  CONTRAST:  197mL OMNIPAQUE IOHEXOL 350 MG/ML SOLN  COMPARISON:  CT chest abdomen pelvis  dated 08/03/2013  RECIST 1.1  Target Lesions:  1. Medial left upper lobe lung nodule - measures 17 mm (series 9/image 48), previously 16 mm 2. Prevascular lymph node - measures 16 mm (series 4/ image 749), previously 20 mm 3. Mediastinal nodal mass - poorly visualized, measures approximately 28 mm (series 4/ image 749), previously 45 mm 4. Right adrenal nodule - measures 36 mm (series 5/image 19), previously 37 mm Non-target Lesions:  1. Pericardial effusion - present (series 4/image 68), unchanged 2. Sclerotic T11 lesion - present (series 4/ image 76), minimally increased 3. Destructive left iliac bone lesion - present (series 5/image 52), grossly unchanged 4. Sclerotic left sacral lesion - present (series 5/image 74), unchanged  FINDINGS: CTA CHEST FINDINGS  No evidence of pulmonary embolism.  Status post left lower lobectomy. Radiation changes in the posterior left upper lobe/perihilar region.  1.7 x 1.4 cm nodular opacity medially in the anterior left upper lobe (series 9/image 48), previously 1.6 x 1.3 cm, suspicious for metastasis.  8 x 6 mm nodule in the medial right lower lobe (series 9/image 55), previously 4 mm, suspicious for metastases. Additional small 3-4 mm nodules in the medial right lower lobe (series 9/ images 51 and 54).  Minimal subpleural nodularity in the anterior right upper lobe, more conspicuous than on the prior study, although nonspecific. Moderate to severe centrilobular and paraseptal emphysematous changes.  Visualized thyroid is unremarkable.  Heart is normal in size. Small pericardial effusion, unchanged. Atherosclerotic calcifications of the aortic arch.  Right chest port terminating at the cavoatrial junction.  Thoracic lymphadenopathy, including:  --7 mm short axis left supraclavicular node (series 4/ image 10), previously 5 mm  --1.4 cm short axis right prevascular/retrosternal node (series 4/ image 749), previously 1.6 cm  --Right paratracheal nodal mass, poorly visualized but  measuring approximately 2.8 x 2.1 cm (series 4/ image 749), previously 4.9 x 4.5 cm  --1.5 cm short axis left prevascular node (series 4/ image 32), previously 6 mm  Widespread osseous metastases in the visualized axial and appendicular skeleton, progressed. For example, age [age] x 2.2 cm lesion in the left T10 vertebral body (series 4/image 69) previously measured 1.4 x 1.5 cm.  CT ABDOMEN and  PELVIS FINDINGS  Liver, spleen, pancreas, and left adrenal gland are within normal limits.  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  Small bilateral renal cysts measuring up to 10 mm in the left lower pole (series 5/ image 33). No hydronephrosis.  No evidence of bowel obstruction. Normal appendix. Colonic diverticulosis, without associated inflammatory changes.  Atherosclerotic calcifications of the abdominal aorta and branch vessels.  No abdominopelvic ascites.  Upper abdominal/ retroperitoneal lymphadenopathy, including  --2.1 cm short axis portacaval node (series 5/ image 19), unchanged  --2.4 cm short axis right para-aortic node (series 5/ image 26), previously 2.0 cm  10 x 14 mm soft tissue implant along the posterior aspect of the left psoas muscle (series 5/image 49), previously 9 x 10 mm. Additional intramuscular metastases in the left pelvis (series 5/image 50, 51, and 44), mildly progressed.  Uterus is notable for probable uterine fibroids. Bilateral ovaries are grossly unremarkable.  Bladder is within normal limits.  Widespread sclerotic osseous metastases throughout the visualized axial and appendicular skeleton, including a permeative/destructive lesion involving the left iliac bone. Mild progression is suspected, including a 1.7 x 2.4 cm anterior right sacral lesion (series 5/ image 52), previously 1.5 x 1.9 cm.  Review of the MIP images confirms the above findings.  IMPRESSION: No evidence of pulmonary embolism.  Status post left lower lobectomy with radiation changes in the  posterior left upper lung/perihilar region.  Progression of bilateral pulmonary and widespread osseous metastases, as above.  Mixed response of thoracoabdominal nodal metastases, with decrease in size of the dominant right paratracheal nodal mass, but mild progression elsewhere.  Soft tissue/intramuscular metastases in the left pelvis, mildly progressed.  RECIST 1.1 measurements as above.  These results were called by telephone at the time of interpretation on 09/30/2013 at 9:56 AM to Dr. Shirlyn Goltz , who verbally acknowledged these results.   Electronically Signed   By: Julian Hy M.D.   On: 09/30/2013 10:44   Ct Abdomen Pelvis W Contrast  09/30/2013   CLINICAL DATA:  Chest pain, evaluate for PE. Lung cancer diagnosed 2011, status post resection. Shortness of breath, nausea/vomiting, chemotherapy ongoing.  EXAM: CT ANGIOGRAPHY CHEST  CT ABDOMEN AND PELVIS WITH CONTRAST  TECHNIQUE: Multidetector CT imaging of the chest was performed using the standard protocol during bolus administration of intravenous contrast. Multiplanar CT image reconstructions and MIPs were obtained to evaluate the vascular anatomy. Multidetector CT imaging of the abdomen and pelvis was performed using the standard protocol during bolus administration of intravenous contrast.  CONTRAST:  159mL OMNIPAQUE IOHEXOL 350 MG/ML SOLN  COMPARISON:  CT chest abdomen pelvis dated 08/03/2013  RECIST 1.1  Target Lesions:  1. Medial left upper lobe lung nodule - measures 17 mm (series 9/image 48), previously 16 mm 2. Prevascular lymph node - measures 16 mm (series 4/ image 29), previously 20 mm 3. Mediastinal nodal mass - poorly visualized, measures approximately 28 mm (series 4/ image 29), previously 45 mm 4. Right adrenal nodule - measures 36 mm (series 5/image 19), previously 37 mm Non-target Lesions:  1. Pericardial effusion - present (series 4/image 68), unchanged 2. Sclerotic T11 lesion - present (series 4/ image 76), minimally increased 3.  Destructive left iliac bone lesion - present (series 5/image 52), grossly unchanged 4. Sclerotic left sacral lesion - present (series 5/image 2), unchanged  FINDINGS: CTA CHEST FINDINGS  No evidence of pulmonary embolism.  Status post left lower lobectomy. Radiation changes in the posterior left upper lobe/perihilar region.  1.7 x 1.4 cm nodular opacity medially in the anterior left upper lobe (series 9/image 48), previously 1.6 x 1.3 cm, suspicious for metastasis.  8 x 6 mm nodule in the medial right lower lobe (series 9/image 55), previously 4 mm, suspicious for metastases. Additional small 3-4 mm nodules in the medial right lower lobe (series 9/ images 51 and 54).  Minimal subpleural nodularity in the anterior right upper lobe, more conspicuous than on the prior study, although nonspecific. Moderate to severe centrilobular and paraseptal emphysematous changes.  Visualized thyroid is unremarkable.  Heart is normal in size. Small pericardial effusion, unchanged. Atherosclerotic calcifications of the aortic arch.  Right chest port terminating at the cavoatrial junction.  Thoracic lymphadenopathy, including:  --7 mm short axis left supraclavicular node (series 4/ image 10), previously 5 mm  --1.4 cm short axis right prevascular/retrosternal node (series 4/ image 69), previously 1.6 cm  --Right paratracheal nodal mass, poorly visualized but measuring approximately 2.8 x 2.1 cm (series 4/ image 69), previously 4.9 x 4.5 cm  --1.5 cm short axis left prevascular node (series 4/ image 32), previously 6 mm  Widespread osseous metastases in the visualized axial and appendicular skeleton, progressed. For example, age 6.7 x 2.2 cm lesion in the left T10 vertebral body (series 4/image 69) previously measured 1.4 x 1.5 cm.  CT ABDOMEN and PELVIS FINDINGS  Liver, spleen, pancreas, and left adrenal gland are within normal limits.  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  Small bilateral renal  cysts measuring up to 10 mm in the left lower pole (series 5/ image 33). No hydronephrosis.  No evidence of bowel obstruction. Normal appendix. Colonic diverticulosis, without associated inflammatory changes.  Atherosclerotic calcifications of the abdominal aorta and branch vessels.  No abdominopelvic ascites.  Upper abdominal/ retroperitoneal lymphadenopathy, including  --2.1 cm short axis portacaval node (series 5/ image 19), unchanged  --2.4 cm short axis right para-aortic node (series 5/ image 66), previously 2.0 cm  10 x 14 mm soft tissue implant along the posterior aspect of the left psoas muscle (series 5/image 49), previously 9 x 10 mm. Additional intramuscular metastases in the left pelvis (series 5/image 50, 51, and 44), mildly progressed.  Uterus is notable for probable uterine fibroids. Bilateral ovaries are grossly unremarkable.  Bladder is within normal limits.  Widespread sclerotic osseous metastases throughout the visualized axial and appendicular skeleton, including a permeative/destructive lesion involving the left iliac bone. Mild progression is suspected, including a 1.7 x 2.4 cm anterior right sacral lesion (series 5/ image 52), previously 1.5 x 1.9 cm.  Review of the MIP images confirms the above findings.  IMPRESSION: No evidence of pulmonary embolism.  Status post left lower lobectomy with radiation changes in the posterior left upper lung/perihilar region.  Progression of bilateral pulmonary and widespread osseous metastases, as above.  Mixed response of thoracoabdominal nodal metastases, with decrease in size of the dominant right paratracheal nodal mass, but mild progression elsewhere.  Soft tissue/intramuscular metastases in the left pelvis, mildly progressed.  RECIST 1.1 measurements as above.  These results were called by telephone at the time of interpretation on 09/30/2013 at 9:56 AM to Dr. Shirlyn Goltz , who verbally acknowledged these results.   Electronically Signed   By: Julian Hy M.D.   On: 09/30/2013 10:44     EKG Interpretation   Date/Time:  Thursday September 30 2013 07:52:55 EDT Ventricular Rate:  107 PR Interval:  120 QRS Duration: 89 QT Interval:  336  QTC Calculation: 448 R Axis:   107 Text Interpretation:  Sinus tachycardia Right axis deviation Nonspecific T  abnrm, anterolateral leads rate faster since previous Confirmed by Lisaanne Lawrie   MD, Tomoki Lucken ([id number]) on 09/30/2013 8:09:18 AM      MDM   Final diagnoses:  Pericardial effusion  Chest pain   Kaitlin Reed is a 63 y.o. female here with SOB, chest pain. Given active cancer, consider PE. She also has previous pericardial effusions on CT so will do bedside US to r/o tamponade but she is not hypotensive. Also consider ACS. Will do ct chest to r/o PE. Will get trop x 2.    9 AM Bedside echo showed mod effusion with RV partial collapse (not complete). Concerned for early tamponade. Will get stat echo. Not hypotensive currently.   10 AM Echo performed. There is effusion but as per tech, no obvious tamponade physiology. I called Dr. Doylene Canard who will read echo and see patient. Recommend IVF at 100 cc /hr for now. CT showed no PE.   1 PM  Dr. Doylene Canard came and will admit.   Wandra Arthurs, MD 09/30/13 1257

## 2013-09-30 NOTE — Progress Notes (Signed)
  Echocardiogram 2D Echocardiogram has been performed.  Donata Clay 09/30/2013, 10:12 AM

## 2013-10-01 LAB — BASIC METABOLIC PANEL
BUN: 11 mg/dL (ref 6–23)
CO2: 24 meq/L (ref 19–32)
CREATININE: 0.77 mg/dL (ref 0.50–1.10)
Calcium: 8.7 mg/dL (ref 8.4–10.5)
Chloride: 96 mEq/L (ref 96–112)
GFR calc Af Amer: >90 mL/min (ref >90)
GFR calc non Af Amer: 86 mL/min — ABNORMAL LOW (ref >90)
GLUCOSE: 85 mg/dL (ref 70–99)
Potassium: 4.4 mEq/L (ref 3.7–5.3)
Sodium: 134 mEq/L — ABNORMAL LOW (ref 137–147)

## 2013-10-01 MED ORDER — METOCLOPRAMIDE HCL 5 MG/5ML PO SOLN
5.0000 mg | Freq: Two times a day (BID) | ORAL | Status: DC
  Administered 2013-10-01 – 2013-10-04 (×7): 5 mg via ORAL
  Filled 2013-10-01 (×8): qty 5

## 2013-10-01 MED ORDER — SODIUM CHLORIDE 0.9 % IJ SOLN
10.0000 mL | INTRAMUSCULAR | Status: DC | PRN
  Administered 2013-10-01 – 2013-10-04 (×2): 10 mL

## 2013-10-01 MED ORDER — ONDANSETRON HCL 4 MG/2ML IJ SOLN
4.0000 mg | Freq: Four times a day (QID) | INTRAMUSCULAR | Status: DC | PRN
Start: 2013-10-01 — End: 2013-10-04
  Administered 2013-10-01 – 2013-10-04 (×6): 4 mg via INTRAVENOUS
  Filled 2013-10-01 (×6): qty 2

## 2013-10-01 MED ORDER — CALCIUM CARBONATE 1250 (500 CA) MG PO TABS
1250.0000 mg | ORAL_TABLET | Freq: Two times a day (BID) | ORAL | Status: DC
  Administered 2013-10-01: 1250 mg via ORAL
  Filled 2013-10-01 (×4): qty 1

## 2013-10-01 MED ORDER — CALCIUM CARBONATE 1250 (500 CA) MG PO TABS
1.0000 | ORAL_TABLET | Freq: Two times a day (BID) | ORAL | Status: DC
  Administered 2013-10-01 – 2013-10-04 (×7): 500 mg via ORAL
  Filled 2013-10-01 (×8): qty 1

## 2013-10-01 NOTE — Care Management Note (Signed)
    Page 1 of 1   10/01/2013     10:33:29 AM CARE MANAGEMENT NOTE 10/01/2013  Patient:  Kaitlin Reed, Kaitlin Reed   Account Number:  [id number]  Date Initiated:  10/01/2013  Documentation initiated by:  Dessa Phi  Subjective/Objective Assessment:   63 Y/O F ADMITTED W/CHRONIC PERICARDITIS W/EFFUSION.     Action/Plan:   FROM HOME.   Anticipated DC Date:  10/01/2013   Anticipated DC Plan:  [place]  CM consult      Choice offered to / List presented to:             Status of service:  Completed, signed off Medicare Important Message given?   (If response is "NO", the following Medicare IM given date fields will be blank) Date Medicare IM given:   Date Additional Medicare IM given:    Discharge Disposition:  HOME/SELF CARE  Per UR Regulation:  Reviewed for med. necessity/level of care/duration of stay  If discussed at [place] of Stay Meetings, dates discussed:    Comments:  10/01/13 Kysean Sweet RN,BSN NCM [phone] D/C HOME.

## 2013-10-01 NOTE — Progress Notes (Signed)
Ref: [name], MD   Subjective:  Feeling weak and nauseated.   Objective:  Vital Signs in the last 24 hours: Temp:  [97.8 F (36.6 C)-98.2 F (36.8 C)] 98.2 F (36.8 C) (06/05 0527) Pulse Rate:  [89-104] 91 (06/05 0527) Cardiac Rhythm:  [-]  Resp:  [18-20] 18 (06/05 0527) BP: (101-130)/(37-95) 103/51 mmHg (06/05 0527) SpO2:  [94 %-99 %] 95 % (06/05 0527) Weight:  [53.6 kg (118 lb 2.7 oz)] 53.6 kg (118 lb 2.7 oz) (06/04 1500)  Physical Exam: BP Readings from Last 1 Encounters:  10/01/13 103/51    Wt Readings from Last 1 Encounters:  09/30/13 53.6 kg (118 lb 2.7 oz)    Weight change:   HEENT: [place]/AT, Eyes-Brown, PERL, EOMI, Multiple dark brown freckles over face. Conj-pale pink. Neck: No JVD, No bruit, Trachea midline. Lungs:  Clear, Bilateral. Right anterior chest Port-A-Cath access. Cardiac:  Regular rhythm, normal S1 and S2, no S3.  Abdomen:  Soft, non-tender. Extremities:  No edema present. No cyanosis. No clubbing. CNS: AxOx3, Cranial nerves grossly intact, moves all 4 extremities. Right handed. Skin: Warm and dry.   Intake/Output from previous day: 06/04 0701 - 06/05 0700 In: 120 [P.O.:120] Out: -     Lab Results: BMET    Component Value Date/Time   NA 134* 10/01/2013 0605   NA 135* 09/30/2013 0804   NA 138 09/21/2013 1454   NA 138 09/07/2013 1511   NA 141 08/24/2013 1505   NA 136* 07/24/2013 0500   NA 138 11/06/2011 1121   NA 146* 03/04/2011 0806   NA 146* 11/29/2010 0803   K 4.4 10/01/2013 0605   K 3.9 09/30/2013 0804   K 4.3 09/21/2013 1454   K 3.8 09/07/2013 1511   K 4.1 08/24/2013 1505   K 4.6 07/24/2013 0500   K 4.3 11/06/2011 1121   K 3.8 03/04/2011 0806   K 4.1 11/29/2010 0803   CL 96 10/01/2013 0605   CL 96 09/30/2013 0804   CL 98 07/24/2013 0500   CL 105 10/07/2012 1350   CL 104 08/24/2012 1547   CL 103 08/17/2012 1544   CL 98 11/06/2011 1121   CL 102 03/04/2011 0806   CL 106 11/29/2010 0803   CO2 24 10/01/2013 0605   CO2 20 09/30/2013 0804   CO2 20* 09/21/2013  1454   CO2 23 09/07/2013 1511   CO2 23 08/24/2013 1505   CO2 20 07/24/2013 0500   CO2 30 11/06/2011 1121   CO2 32 03/04/2011 0806   CO2 30 11/29/2010 0803   GLUCOSE 85 10/01/2013 0605   GLUCOSE 114* 09/30/2013 0804   GLUCOSE 111 09/21/2013 1454   GLUCOSE 111 09/07/2013 1511   GLUCOSE 126 08/24/2013 1505   GLUCOSE 73 07/24/2013 0500   GLUCOSE 123* 10/07/2012 1350   GLUCOSE 105* 08/24/2012 1547   GLUCOSE 105* 08/17/2012 1544   GLUCOSE 108 11/06/2011 1121   GLUCOSE 99 03/04/2011 0806   GLUCOSE 105 11/29/2010 0803   BUN 11 10/01/2013 0605   BUN 12 09/30/2013 0804   BUN 21.5 09/21/2013 1454   BUN 17.1 09/07/2013 1511   BUN 23.3 08/24/2013 1505   BUN 9 07/24/2013 0500   BUN 15 11/06/2011 1121   BUN 12 03/04/2011 0806   BUN 16 11/29/2010 0803   CREATININE 0.77 10/01/2013 0605   CREATININE 0.77 09/30/2013 0804   CREATININE 1.1 09/21/2013 1454   CREATININE 1.0 09/07/2013 1511   CREATININE 1.1 08/24/2013 1505   CREATININE 0.78 07/24/2013 0500  CREATININE 0.9 11/06/2011 1121   CREATININE 0.9 03/04/2011 0806   CREATININE 1.1 11/29/2010 0803   CALCIUM 8.7 10/01/2013 0605   CALCIUM 9.3 09/30/2013 0804   CALCIUM 10.0 09/21/2013 1454   CALCIUM 10.1 09/07/2013 1511   CALCIUM 10.1 08/24/2013 1505   CALCIUM 8.8 07/24/2013 0500   CALCIUM 9.0 11/06/2011 1121   CALCIUM 9.0 03/04/2011 0806   CALCIUM 9.3 11/29/2010 0803   GFRNONAA 86* 10/01/2013 0605   GFRNONAA 86* 09/30/2013 0804   GFRNONAA 86* 07/24/2013 0500   GFRAA >90 10/01/2013 0605   GFRAA >90 09/30/2013 0804   GFRAA >90 07/24/2013 0500   CBC    Component Value Date/Time   WBC 7.3 09/30/2013 0804   WBC 7.8 09/21/2013 1454   RBC 3.83* 09/30/2013 0804   RBC 4.13 09/21/2013 1454   HGB 10.1* 09/30/2013 0804   HGB 10.8* 09/21/2013 1454   HCT 31.0* 09/30/2013 0804   HCT 33.5* 09/21/2013 1454   PLT 359 09/30/2013 0804   PLT 376 09/21/2013 1454   MCV 80.9 09/30/2013 0804   MCV 81.1 09/21/2013 1454   MCH 26.4 09/30/2013 0804   MCH 26.2 09/21/2013 1454   MCHC 32.6 09/30/2013 0804   MCHC 32.2 09/21/2013 1454    RDW 15.4 09/30/2013 0804   RDW 15.3* 09/21/2013 1454   LYMPHSABS 1.0 09/21/2013 1454   LYMPHSABS 0.8 08/11/2013 1126   MONOABS 0.6 09/21/2013 1454   MONOABS 0.5 08/11/2013 1126   EOSABS 0.0 09/21/2013 1454   EOSABS 0.1 08/11/2013 1126   BASOSABS 0.0 09/21/2013 1454   BASOSABS 0.0 08/11/2013 1126   HEPATIC Function Panel  Recent Labs  09/07/13 1511 09/21/13 1454 09/30/13 0804  PROT 8.3 8.4* 7.7   HEMOGLOBIN A1C No components found with this basename: HGA1C,  MPG   CARDIAC ENZYMES Lab Results  Component Value Date   CKTOTAL 164 04/17/2011   CKMB 2.3 04/17/2011   TROPONINI <0.30 01/21/2012   TROPONINI <0.30 01/21/2012   TROPONINI <0.30 01/20/2012   BNP  Recent Labs  09/30/13 0804  PROBNP 255.5*   TSH  Recent Labs  06/03/13 1513 07/20/13 1354 09/21/13 1454  TSH 2.529 4.730* 2.919   CHOLESTEROL No results found for this basename: CHOL,  in the last 8760 hours  Scheduled Meds: . calcium carbonate  1,250 mg Oral BID WC  . colchicine  0.6 mg Oral Daily  . dronabinol  2.5 mg Oral BID AC  . ferrous sulfate  325 mg Oral BID WC  . hydrocortisone   Rectal TID  . metoCLOPramide  5 mg Oral BID AC  . metoprolol tartrate  25 mg Oral BID  . multivitamin with minerals  1 tablet Oral Daily  . potassium chloride  10 mEq Oral BID  . sodium chloride  3 mL Intravenous Q12H  . sodium chloride  3 mL Intravenous Q12H   Continuous Infusions:  PRN Meds:.sodium chloride, albuterol, HYDROmorphone (DILAUDID) injection, ondansetron (ZOFRAN) IV, sodium chloride, sodium chloride, temazepam  Assessment/Plan: Chest pain  Chronic Pericarditis with effusion  Anemia of chronic disease  Non-small cell lung cancer with metastasis Weight loss Nausea/vomiting  Add small dose reglan. Change diet to soft. Increase activity as tolerated.    LOS: 1 day    Dixie Dials  MD  10/01/2013, 9:45 AM

## 2013-10-02 MED ORDER — BISACODYL 10 MG RE SUPP
10.0000 mg | Freq: Once | RECTAL | Status: AC
  Administered 2013-10-02: 10 mg via RECTAL
  Filled 2013-10-02: qty 1

## 2013-10-02 MED ORDER — PANTOPRAZOLE SODIUM 40 MG PO PACK
20.0000 mg | PACK | Freq: Every day | ORAL | Status: DC
  Administered 2013-10-02 – 2013-10-04 (×2): 20 mg via ORAL
  Filled 2013-10-02 (×5): qty 20

## 2013-10-02 MED ORDER — METOPROLOL TARTRATE 12.5 MG HALF TABLET
12.5000 mg | ORAL_TABLET | Freq: Two times a day (BID) | ORAL | Status: DC
  Administered 2013-10-02 – 2013-10-04 (×3): 12.5 mg via ORAL
  Filled 2013-10-02 (×5): qty 1

## 2013-10-02 NOTE — Progress Notes (Signed)
Ref: [name], MD   Subjective:  Feeling less nauseated.  Objective:  Vital Signs in the last 24 hours: Temp:  [98.1 F (36.7 C)-99 F (37.2 C)] 98.1 F (36.7 C) (06/06 0510) Pulse Rate:  [88-110] 88 (06/06 0510) Cardiac Rhythm:  [-] Normal sinus rhythm (06/05 1905) Resp:  [18] 18 (06/06 0510) BP: (93-110)/(47-63) 105/48 mmHg (06/06 0942) SpO2:  [94 %-99 %] 96 % (06/06 0510)  Physical Exam: BP Readings from Last 1 Encounters:  10/02/13 105/48    Wt Readings from Last 1 Encounters:  09/30/13 53.6 kg (118 lb 2.7 oz)    Weight change:   HEENT: [place]/AT, Eyes-Brown, PERL, EOMI, Conjunctiva-Pink, Sclera-Non-icteric. Multiple freckles over face. Neck: No JVD, No bruit, Trachea midline. Lungs:  Clear, Bilateral. Porta cath on right side. Cardiac:  Regular rhythm, normal S1 and S2, no S3.  Abdomen:  Soft, non-tender. Extremities:  No edema present. No cyanosis. No clubbing. CNS: AxOx3, Cranial nerves grossly intact, moves all 4 extremities. Right handed. Skin: Warm and dry.   Intake/Output from previous day: 06/05 0701 - 06/06 0700 In: 440 [P.O.:440] Out: -     Lab Results: BMET    Component Value Date/Time   NA 134* 10/01/2013 0605   NA 135* 09/30/2013 0804   NA 138 09/21/2013 1454   NA 138 09/07/2013 1511   NA 141 08/24/2013 1505   NA 136* 07/24/2013 0500   NA 138 11/06/2011 1121   NA 146* 03/04/2011 0806   NA 146* 11/29/2010 0803   K 4.4 10/01/2013 0605   K 3.9 09/30/2013 0804   K 4.3 09/21/2013 1454   K 3.8 09/07/2013 1511   K 4.1 08/24/2013 1505   K 4.6 07/24/2013 0500   K 4.3 11/06/2011 1121   K 3.8 03/04/2011 0806   K 4.1 11/29/2010 0803   CL 96 10/01/2013 0605   CL 96 09/30/2013 0804   CL 98 07/24/2013 0500   CL 105 10/07/2012 1350   CL 104 08/24/2012 1547   CL 103 08/17/2012 1544   CL 98 11/06/2011 1121   CL 102 03/04/2011 0806   CL 106 11/29/2010 0803   CO2 24 10/01/2013 0605   CO2 20 09/30/2013 0804   CO2 20* 09/21/2013 1454   CO2 23 09/07/2013 1511   CO2 23 08/24/2013 1505   CO2 20 07/24/2013 0500   CO2 30 11/06/2011 1121   CO2 32 03/04/2011 0806   CO2 30 11/29/2010 0803   GLUCOSE 85 10/01/2013 0605   GLUCOSE 114* 09/30/2013 0804   GLUCOSE 111 09/21/2013 1454   GLUCOSE 111 09/07/2013 1511   GLUCOSE 126 08/24/2013 1505   GLUCOSE 73 07/24/2013 0500   GLUCOSE 123* 10/07/2012 1350   GLUCOSE 105* 08/24/2012 1547   GLUCOSE 105* 08/17/2012 1544   GLUCOSE 108 11/06/2011 1121   GLUCOSE 99 03/04/2011 0806   GLUCOSE 105 11/29/2010 0803   BUN 11 10/01/2013 0605   BUN 12 09/30/2013 0804   BUN 21.5 09/21/2013 1454   BUN 17.1 09/07/2013 1511   BUN 23.3 08/24/2013 1505   BUN 9 07/24/2013 0500   BUN 15 11/06/2011 1121   BUN 12 03/04/2011 0806   BUN 16 11/29/2010 0803   CREATININE 0.77 10/01/2013 0605   CREATININE 0.77 09/30/2013 0804   CREATININE 1.1 09/21/2013 1454   CREATININE 1.0 09/07/2013 1511   CREATININE 1.1 08/24/2013 1505   CREATININE 0.78 07/24/2013 0500   CREATININE 0.9 11/06/2011 1121   CREATININE 0.9 03/04/2011 0806   CREATININE 1.1 11/29/2010  4315   CALCIUM 8.7 10/01/2013 0605   CALCIUM 9.3 09/30/2013 0804   CALCIUM 10.0 09/21/2013 1454   CALCIUM 10.1 09/07/2013 1511   CALCIUM 10.1 08/24/2013 1505   CALCIUM 8.8 07/24/2013 0500   CALCIUM 9.0 11/06/2011 1121   CALCIUM 9.0 03/04/2011 0806   CALCIUM 9.3 11/29/2010 0803   GFRNONAA 86* 10/01/2013 0605   GFRNONAA 86* 09/30/2013 0804   GFRNONAA 86* 07/24/2013 0500   GFRAA >90 10/01/2013 0605   GFRAA >90 09/30/2013 0804   GFRAA >90 07/24/2013 0500   CBC    Component Value Date/Time   WBC 7.3 09/30/2013 0804   WBC 7.8 09/21/2013 1454   RBC 3.83* 09/30/2013 0804   RBC 4.13 09/21/2013 1454   HGB 10.1* 09/30/2013 0804   HGB 10.8* 09/21/2013 1454   HCT 31.0* 09/30/2013 0804   HCT 33.5* 09/21/2013 1454   PLT 359 09/30/2013 0804   PLT 376 09/21/2013 1454   MCV 80.9 09/30/2013 0804   MCV 81.1 09/21/2013 1454   MCH 26.4 09/30/2013 0804   MCH 26.2 09/21/2013 1454   MCHC 32.6 09/30/2013 0804   MCHC 32.2 09/21/2013 1454   RDW 15.4 09/30/2013 0804   RDW 15.3* 09/21/2013 1454    LYMPHSABS 1.0 09/21/2013 1454   LYMPHSABS 0.8 08/11/2013 1126   MONOABS 0.6 09/21/2013 1454   MONOABS 0.5 08/11/2013 1126   EOSABS 0.0 09/21/2013 1454   EOSABS 0.1 08/11/2013 1126   BASOSABS 0.0 09/21/2013 1454   BASOSABS 0.0 08/11/2013 1126   HEPATIC Function Panel  Recent Labs  09/07/13 1511 09/21/13 1454 09/30/13 0804  PROT 8.3 8.4* 7.7   HEMOGLOBIN A1C No components found with this basename: HGA1C,  MPG   CARDIAC ENZYMES Lab Results  Component Value Date   CKTOTAL 164 04/17/2011   CKMB 2.3 04/17/2011   TROPONINI <0.30 01/21/2012   TROPONINI <0.30 01/21/2012   TROPONINI <0.30 01/20/2012   BNP  Recent Labs  09/30/13 0804  PROBNP 255.5*   TSH  Recent Labs  06/03/13 1513 07/20/13 1354 09/21/13 1454  TSH 2.529 4.730* 2.919   CHOLESTEROL No results found for this basename: CHOL,  in the last 8760 hours  Scheduled Meds: . calcium carbonate  1 tablet Oral BID WC  . colchicine  0.6 mg Oral Daily  . dronabinol  2.5 mg Oral BID AC  . ferrous sulfate  325 mg Oral BID WC  . hydrocortisone   Rectal TID  . metoCLOPramide  5 mg Oral BID AC  . metoprolol tartrate  12.5 mg Oral BID  . multivitamin with minerals  1 tablet Oral Daily  . pantoprazole sodium  20 mg Oral Q1200  . potassium chloride  10 mEq Oral BID  . sodium chloride  3 mL Intravenous Q12H  . sodium chloride  3 mL Intravenous Q12H   Continuous Infusions:  PRN Meds:.sodium chloride, albuterol, HYDROmorphone (DILAUDID) injection, ondansetron (ZOFRAN) IV, sodium chloride, sodium chloride, temazepam  Assessment/Plan: Chest pain  Chronic Pericarditis with effusion  Anemia of chronic disease  Non-small cell lung cancer with metastasis  Weight loss  Nausea/vomiting  Add Protonix. Decrease metoprolol by 50 %.     LOS: 2 days    Dixie Dials  MD  10/02/2013, 9:47 AM

## 2013-10-03 NOTE — Progress Notes (Addendum)
INITIAL NUTRITION ASSESSMENT  Patient meets the criteria for severe MALNUTRITION in the context of chronic illness with 14% weight loss in 3 months, PO intake <75% of estimated needs, and severe muscle and subcutaneous fat loss.   DOCUMENTATION CODES Per approved criteria  -Severe malnutrition in the context of chronic illness   INTERVENTION: - Patient declines nutrition supplements due to preferences/tolerance. She was encouraged to order higher calorie/protein foods for meals, and request snacks TID.  - Reviewed strategies for increasing calories and protein in the diet. Patient encouraged to continue to find nutrition supplements she tolerates. Recommended Carnation Instant Breakfast mixed with soy or lactaid milk.   NUTRITION DIAGNOSIS: Unintentional weight loss related to decreased appetite as evidenced by 20 pound weight loss in 3 months.   Goal: Patient will meet >/=90% of estimated nutrition needs  Monitor:  PO intake, weight, labs  Reason for Assessment: Malnutrition screening tool  63 y.o. female  Admitting Dx: Pericarditis  ASSESSMENT: 63 year old female with history of non-small cell lung cancer with metastasis. She is on palliative chemotherapy.   -Patient reports a poor appetite prior to admission. Her appetite has increased today, and she was observed to have eating about 75% of her lunch. She is trying to order higher calorie foods.  -She has lost about 20 pounds in the last 3 months (14% of UBW). She has tried several nutrition supplements, but they cause diarrhea. She is lactose intolerant. We discussed other options that might be better tolerated.   -Patient spoke with [place] RD over the phone 2 weeks abo, who provided pt with education on increasing calories and protein.   Nutrition Focused Physical Exam:  Subcutaneous Fat:  Orbital Region: severe wasting Upper Arm Region: severe wasting Thoracic and Lumbar Region: severe wasting  Muscle:  Temple  Region: severe wasting Clavicle Bone Region: severe wasting Clavicle and Acromion Bone Region: severe wasting Scapular Bone Region: severe wasting Dorsal Hand: severe wasting Patellar Region: severe wasting Anterior Thigh Region: severe wasting Posterior Calf Region: severe wasting  Edema: not present   Height: Ht Readings from Last 1 Encounters:  09/30/13 5\' 6"  (1.676 m)    Weight: Wt Readings from Last 1 Encounters:  09/30/13 118 lb 2.7 oz (53.6 kg)    Ideal Body Weight: 130 pounds  % Ideal Body Weight: 91%  Wt Readings from Last 10 Encounters:  09/30/13 118 lb 2.7 oz (53.6 kg)  09/21/13 118 lb 12.8 oz (53.887 kg)  09/07/13 122 lb 4.8 oz (55.475 kg)  08/24/13 126 lb 3.2 oz (57.244 kg)  08/11/13 131 lb 1.6 oz (59.467 kg)  08/11/13 130 lb (58.968 kg)  08/03/13 130 lb 12.8 oz (59.33 kg)  07/27/13 131 lb 9.6 oz (59.693 kg)  07/26/13 129 lb 1.6 oz (58.559 kg)  07/26/13 129 lb 1.6 oz (58.559 kg)    Usual Body Weight: 137 pounds in early March   % Usual Body Weight: 86%  BMI:  Body mass index is 19.08 kg/(m^2). Patient is normal weight.   Estimated Nutritional Needs: Kcal: 1550-1750 kcal Protein: 75-85 g Fluid: >1.9 L/day  Skin: Intact  Diet Order: Criss Rosales  EDUCATION NEEDS: -Education needs addressed   Intake/Output Summary (Last 24 hours) at 10/03/13 1452 Last data filed at 10/03/13 0600  Gross per 24 hour  Intake  855.5 ml  Output      0 ml  Net  855.5 ml    Last BM: 6/7   Labs:   Recent Labs Lab 09/30/13 0804 10/01/13  0605  NA 135* 134*  K 3.9 4.4  CL 96 96  CO2 20 24  BUN 12 11  CREATININE 0.77 0.77  CALCIUM 9.3 8.7  GLUCOSE 114* 85    CBG (last 3)  No results found for this basename: GLUCAP,  in the last 72 hours  Scheduled Meds: . calcium carbonate  1 tablet Oral BID WC  . colchicine  0.6 mg Oral Daily  . dronabinol  2.5 mg Oral BID AC  . ferrous sulfate  325 mg Oral BID WC  . hydrocortisone   Rectal TID  . metoCLOPramide  5  mg Oral BID AC  . metoprolol tartrate  12.5 mg Oral BID  . multivitamin with minerals  1 tablet Oral Daily  . pantoprazole sodium  20 mg Oral Q1200  . potassium chloride  10 mEq Oral BID  . sodium chloride  3 mL Intravenous Q12H  . sodium chloride  3 mL Intravenous Q12H    Continuous Infusions:   Past Medical History  Diagnosis Date  . Heart murmur   . 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 Anemia   . History of blood transfusion 1970's    "related to tubal pregnancy"   . Radiation 07/29/13-08/13/13    right partracheal lymph nodes  . lung ca dx'd 06/2009    chemo comp 07/2010; xrt comp 06/2010    Past Surgical History  Procedure Laterality Date  . Lung removal, partial Left 10/17/09    pt states they removed small piece of lung due to cancer  . 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 Insertion central venous access device w/ subcutaneous port Right 05/2013    "PowerPort"  . Video bronchoscopy Bilateral 07/23/2013    Procedure: VIDEO BRONCHOSCOPY WITHOUT FLUORO;  Surgeon: Doree Fudge, MD;  Location: [facility];  Service: Cardiopulmonary;  Laterality: Bilateral;    Larey Seat, RD, LDN Pager #: [phone] After-Hours Pager #: [phone]

## 2013-10-03 NOTE — Progress Notes (Signed)
Ref: [name], MD   Subjective:  Feeling little better. Oral intake remains poor. Remains hypotensive but walked today.  Objective:  Vital Signs in the last 24 hours: Temp:  [98 F (36.7 C)-98.5 F (36.9 C)] 98 F (36.7 C) (06/07 1330) Pulse Rate:  [91-102] 91 (06/07 1330) Cardiac Rhythm:  [-] Normal sinus rhythm (06/06 2018) Resp:  [18-20] 18 (06/07 1330) BP: (95-119)/(42-58) 95/52 mmHg (06/07 1330) SpO2:  [92 %-97 %] 92 % (06/07 1330)  Physical Exam: BP Readings from Last 1 Encounters:  10/03/13 95/52    Wt Readings from Last 1 Encounters:  09/30/13 53.6 kg (118 lb 2.7 oz)    Weight change:   HEENT: [place]/AT, Eyes-Brown, PERL, EOMI, Conjunctiva-Pink, Sclera-Non-icteric. Multiple freckles over face. Neck: No JVD, No bruit, Trachea midline. Lungs:  Clear, Bilateral.Right sided porta cath. Cardiac:  Regular rhythm, normal S1 and S2, no S3.  Abdomen:  Soft, non-tender. Extremities:  No edema present. No cyanosis. No clubbing. CNS: AxOx3, Cranial nerves grossly intact, moves all 4 extremities. Right handed. Skin: Warm and dry.   Intake/Output from previous day: 06/06 0701 - 06/07 0700 In: 975.5 [P.O.:450; I.V.:525.5] Out: -     Lab Results: BMET    Component Value Date/Time   NA 134* 10/01/2013 0605   NA 135* 09/30/2013 0804   NA 138 09/21/2013 1454   NA 138 09/07/2013 1511   NA 141 08/24/2013 1505   NA 136* 07/24/2013 0500   NA 138 11/06/2011 1121   NA 146* 03/04/2011 0806   NA 146* 11/29/2010 0803   K 4.4 10/01/2013 0605   K 3.9 09/30/2013 0804   K 4.3 09/21/2013 1454   K 3.8 09/07/2013 1511   K 4.1 08/24/2013 1505   K 4.6 07/24/2013 0500   K 4.3 11/06/2011 1121   K 3.8 03/04/2011 0806   K 4.1 11/29/2010 0803   CL 96 10/01/2013 0605   CL 96 09/30/2013 0804   CL 98 07/24/2013 0500   CL 105 10/07/2012 1350   CL 104 08/24/2012 1547   CL 103 08/17/2012 1544   CL 98 11/06/2011 1121   CL 102 03/04/2011 0806   CL 106 11/29/2010 0803   CO2 24 10/01/2013 0605   CO2 20 09/30/2013 0804   CO2  20* 09/21/2013 1454   CO2 23 09/07/2013 1511   CO2 23 08/24/2013 1505   CO2 20 07/24/2013 0500   CO2 30 11/06/2011 1121   CO2 32 03/04/2011 0806   CO2 30 11/29/2010 0803   GLUCOSE 85 10/01/2013 0605   GLUCOSE 114* 09/30/2013 0804   GLUCOSE 111 09/21/2013 1454   GLUCOSE 111 09/07/2013 1511   GLUCOSE 126 08/24/2013 1505   GLUCOSE 73 07/24/2013 0500   GLUCOSE 123* 10/07/2012 1350   GLUCOSE 105* 08/24/2012 1547   GLUCOSE 105* 08/17/2012 1544   GLUCOSE 108 11/06/2011 1121   GLUCOSE 99 03/04/2011 0806   GLUCOSE 105 11/29/2010 0803   BUN 11 10/01/2013 0605   BUN 12 09/30/2013 0804   BUN 21.5 09/21/2013 1454   BUN 17.1 09/07/2013 1511   BUN 23.3 08/24/2013 1505   BUN 9 07/24/2013 0500   BUN 15 11/06/2011 1121   BUN 12 03/04/2011 0806   BUN 16 11/29/2010 0803   CREATININE 0.77 10/01/2013 0605   CREATININE 0.77 09/30/2013 0804   CREATININE 1.1 09/21/2013 1454   CREATININE 1.0 09/07/2013 1511   CREATININE 1.1 08/24/2013 1505   CREATININE 0.78 07/24/2013 0500   CREATININE 0.9 11/06/2011 1121  CREATININE 0.9 03/04/2011 0806   CREATININE 1.1 11/29/2010 0803   CALCIUM 8.7 10/01/2013 0605   CALCIUM 9.3 09/30/2013 0804   CALCIUM 10.0 09/21/2013 1454   CALCIUM 10.1 09/07/2013 1511   CALCIUM 10.1 08/24/2013 1505   CALCIUM 8.8 07/24/2013 0500   CALCIUM 9.0 11/06/2011 1121   CALCIUM 9.0 03/04/2011 0806   CALCIUM 9.3 11/29/2010 0803   GFRNONAA 86* 10/01/2013 0605   GFRNONAA 86* 09/30/2013 0804   GFRNONAA 86* 07/24/2013 0500   GFRAA >90 10/01/2013 0605   GFRAA >90 09/30/2013 0804   GFRAA >90 07/24/2013 0500   CBC    Component Value Date/Time   WBC 7.3 09/30/2013 0804   WBC 7.8 09/21/2013 1454   RBC 3.83* 09/30/2013 0804   RBC 4.13 09/21/2013 1454   HGB 10.1* 09/30/2013 0804   HGB 10.8* 09/21/2013 1454   HCT 31.0* 09/30/2013 0804   HCT 33.5* 09/21/2013 1454   PLT 359 09/30/2013 0804   PLT 376 09/21/2013 1454   MCV 80.9 09/30/2013 0804   MCV 81.1 09/21/2013 1454   MCH 26.4 09/30/2013 0804   MCH 26.2 09/21/2013 1454   MCHC 32.6 09/30/2013 0804   MCHC 32.2  09/21/2013 1454   RDW 15.4 09/30/2013 0804   RDW 15.3* 09/21/2013 1454   LYMPHSABS 1.0 09/21/2013 1454   LYMPHSABS 0.8 08/11/2013 1126   MONOABS 0.6 09/21/2013 1454   MONOABS 0.5 08/11/2013 1126   EOSABS 0.0 09/21/2013 1454   EOSABS 0.1 08/11/2013 1126   BASOSABS 0.0 09/21/2013 1454   BASOSABS 0.0 08/11/2013 1126   HEPATIC Function Panel  Recent Labs  09/07/13 1511 09/21/13 1454 09/30/13 0804  PROT 8.3 8.4* 7.7   HEMOGLOBIN A1C No components found with this basename: HGA1C,  MPG   CARDIAC ENZYMES Lab Results  Component Value Date   CKTOTAL 164 04/17/2011   CKMB 2.3 04/17/2011   TROPONINI <0.30 01/21/2012   TROPONINI <0.30 01/21/2012   TROPONINI <0.30 01/20/2012   BNP  Recent Labs  09/30/13 0804  PROBNP 255.5*   TSH  Recent Labs  06/03/13 1513 07/20/13 1354 09/21/13 1454  TSH 2.529 4.730* 2.919   CHOLESTEROL No results found for this basename: CHOL,  in the last 8760 hours  Scheduled Meds: . calcium carbonate  1 tablet Oral BID WC  . colchicine  0.6 mg Oral Daily  . dronabinol  2.5 mg Oral BID AC  . ferrous sulfate  325 mg Oral BID WC  . hydrocortisone   Rectal TID  . metoCLOPramide  5 mg Oral BID AC  . metoprolol tartrate  12.5 mg Oral BID  . multivitamin with minerals  1 tablet Oral Daily  . pantoprazole sodium  20 mg Oral Q1200  . potassium chloride  10 mEq Oral BID  . sodium chloride  3 mL Intravenous Q12H  . sodium chloride  3 mL Intravenous Q12H   Continuous Infusions:  PRN Meds:.sodium chloride, albuterol, HYDROmorphone (DILAUDID) injection, ondansetron (ZOFRAN) IV, sodium chloride, sodium chloride, temazepam  Assessment/Plan: Chest pain  Chronic Pericarditis with effusion  Anemia of chronic disease  Non-small cell lung cancer with metastasis  Weight loss  Nausea/vomiting  Increase activity with walker use. Home in AM if stable.     LOS: 3 days    Dixie Dials  MD  10/03/2013, 1:55 PM

## 2013-10-04 DIAGNOSIS — E43 Unspecified severe protein-calorie malnutrition: Secondary | ICD-10-CM | POA: Insufficient documentation

## 2013-10-04 LAB — URINALYSIS, ROUTINE W REFLEX MICROSCOPIC
BILIRUBIN URINE: NEGATIVE
Glucose, UA: NEGATIVE mg/dL
Hgb urine dipstick: NEGATIVE
Ketones, ur: NEGATIVE mg/dL
Leukocytes, UA: NEGATIVE
Nitrite: NEGATIVE
Protein, ur: NEGATIVE mg/dL
SPECIFIC GRAVITY, URINE: 1.008 (ref 1.005–1.030)
UROBILINOGEN UA: 0.2 mg/dL (ref 0.0–1.0)
pH: 7.5 (ref 5.0–8.0)

## 2013-10-04 MED ORDER — PANTOPRAZOLE SODIUM 40 MG PO PACK: 20.0000 mg | PACK | Freq: Every day | ORAL | Status: DC

## 2013-10-04 MED ORDER — METOPROLOL TARTRATE 25 MG PO TABS: 12.5000 mg | ORAL_TABLET | Freq: Two times a day (BID) | ORAL | Status: AC

## 2013-10-04 MED ORDER — HYDROMORPHONE HCL 2 MG PO TABS: 1.0000 mg | ORAL_TABLET | Freq: Four times a day (QID) | ORAL | Status: DC | PRN

## 2013-10-04 MED ORDER — COLCHICINE 0.6 MG PO TABS: 0.6000 mg | ORAL_TABLET | Freq: Every day | ORAL | Status: AC

## 2013-10-04 MED ORDER — METOCLOPRAMIDE HCL 5 MG/5ML PO SOLN: 5.0000 mg | Freq: Two times a day (BID) | ORAL | Status: AC

## 2013-10-04 MED ORDER — HEPARIN SOD (PORK) LOCK FLUSH 100 UNIT/ML IV SOLN
500.0000 [IU] | INTRAVENOUS | Status: DC | PRN
Start: 2013-10-04 — End: 2013-10-04
  Administered 2013-10-04: 500 [IU]
  Filled 2013-10-04: qty 5

## 2013-10-04 MED ORDER — HEPARIN SOD (PORK) LOCK FLUSH 100 UNIT/ML IV SOLN
500.0000 [IU] | INTRAVENOUS | Status: DC
  Filled 2013-10-04: qty 5

## 2013-10-04 MED ORDER — HYDROMORPHONE HCL 2 MG PO TABS: 1.0000 mg | ORAL_TABLET | Freq: Four times a day (QID) | ORAL | Status: AC | PRN

## 2013-10-04 NOTE — Discharge Summary (Signed)
Physician Discharge Summary  Patient ID: Kaitlin Reed MRN: [id number] DOB/AGE: 65/19/1952 65 y.o.  Admit date: 09/30/2013 Discharge date: 10/04/2013  Admission Diagnoses: Chronic Pericarditis with effusion Chest pain  Anemia of chronic disease  Non-small cell lung cancer with metastasis   Discharge Diagnoses:  Principle Problem: * Chronic pericarditis with effusion * Protein-calorie malnutrition, severe Chronic Pericarditis with effusion  Anemia of chronic disease  Non-small cell lung cancer with metastasis  Abnormal weight loss  Discharged Condition: fair  Hospital Course: 63 y.o. 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 She is here with chest pain. She's been feeling weak and dizzy for the last several weeks. Has seen oncology and was put on Ritalin. However felt dehydrated for the last week or so. Today she is supposed to get a followup CT chest abdomen pelvis to see the status of her cancer. She was at the radiology desk and started having substernal chest pain radiating to her left arm lasting for several minutes. Some mild shortness of breath as well. Also felt nauseous but didn't vomit. No history of CAD or cardiac stents. Echocardiogram shows mild to moderate pericardial effusion, free flowing without tamponade. Different treatment options were presented to patient but patient decided to go home with self care. She has strong family support. She may accept hospice in near future. Her overall prognosis remains poor and she is not a candidate for cardiac interventions or any major surgery due to history of significant bleed 3 months ago and lung CA with Mets.  Consults: cardiology  Significant Diagnostic Studies: labs: Normal; WBC and Platelets counts. Hgb of 10.1. Near normal electrolytes with elevated alkaline phosphatase.  CT chest, abdomen and Pelvis: No evidence of pulmonary embolism. Status post left lower lobectomy with  radiation changes in the posterior left upper lung/perihilar region. Progression of bilateral pulmonary and widespread osseous metastases, as above. Mixed response of thoracoabdominal nodal metastases, with decrease in size of the dominant right paratracheal nodal mass, but mild progression elsewhere. Soft tissue/intramuscular metastases in the left pelvis, mildly progressed.   EKG-NSR.  Echocardiogram: Small to moderate pericardial effusion without tamponade.  Treatments: analgesia: Dilaudid  Discharge Exam: Blood pressure 100/47, pulse 97, temperature 97.5 F (36.4 C), temperature source Oral, resp. rate 18, height 5\' 6"  (1.676 m), weight 53.6 kg (118 lb 2.7 oz), SpO2 97.00%. HEENT: [place]/AT, Eyes-Brown, PERL, EOMI, Conjunctiva-Pink, Sclera-Non-icteric. Multiple freckles over face.  Neck: No JVD, No bruit, Trachea midline.  Lungs: Clear, Bilateral.Right sided porta cath.  Cardiac: Regular rhythm, normal S1 and S2, no S3.  Abdomen: Soft, non-tender.  Extremities: No edema present. No cyanosis. No clubbing.  CNS: AxOx3, Cranial nerves grossly intact, moves all 4 extremities. Right handed.  Skin: Warm and dry.   Disposition: 01-Home or Self Care     Medication List    STOP taking these medications       aspirin EC 81 MG tablet      TAKE these medications       albuterol 108 (90 BASE) MCG/ACT inhaler  Commonly known as:  PROAIR HFA  Inhale 2 puffs into the lungs every 6 (six) hours as needed for wheezing or shortness of breath.     calcium carbonate 600 MG Tabs tablet  Commonly known as:  OS-CAL  Take 600 mg by mouth 2 (two) times daily with a meal.     colchicine 0.6 MG tablet  Take 1 tablet (0.6 mg total) by mouth daily.  dronabinol 2.5 MG capsule  Commonly known as:  MARINOL  Take 1 capsule (2.5 mg total) by mouth 2 (two) times daily before a meal.     ferrous sulfate 325 (65 FE) MG tablet  Take 1 tablet (325 mg total) by mouth 2 (two) times daily with a meal.      hyaluronate sodium Gel  Apply 1 application topically 2 (two) times daily.     metoCLOPramide 5 MG/5ML solution  Commonly known as:  REGLAN  Take 5 mLs (5 mg total) by mouth 2 (two) times daily before a meal.     metoprolol tartrate 25 MG tablet  Commonly known as:  LOPRESSOR  Take 0.5 tablets (12.5 mg total) by mouth 2 (two) times daily.     multivitamin with minerals Tabs tablet  Take 1 tablet by mouth daily.     nivolumab 3 mg/kg in sodium chloride 0.9 % 100 mL  Inject 3 mg/kg into the vein every 14 (fourteen) days.     NIVOLUMAB IV  Inject 3 mg/kg into the vein every 14 (fourteen) days. *iv infusion that runs for 51min*     pantoprazole sodium 40 mg/20 mL Pack  Commonly known as:  PROTONIX  Take 10 mLs (20 mg total) by mouth daily at 12 noon.     potassium chloride 10 MEQ tablet  Commonly known as:  K-DUR,KLOR-CON  Take 1 tablet (10 mEq total) by mouth 2 (two) times daily.     Selenium 200 MCG Caps  Take 200 mcg by mouth daily.     temazepam 15 MG capsule  Commonly known as:  RESTORIL  Take 15 mg by mouth at bedtime as needed for sleep.     vitamin B-12 1000 MCG tablet  Commonly known as:  CYANOCOBALAMIN  Take 1,000 mcg by mouth daily.     vitamin C 1000 MG tablet  Take 1,000 mg by mouth daily.     Vitamin D 2000 UNITS tablet  Take 2,000 Units by mouth daily.     vitamin E 400 UNIT capsule  Take 400 Units by mouth daily.     ZOMETA 4 MG/100ML IVPB  Generic drug:  Zoledronic Acid  Inject 4 mg into the vein every 28 (twenty-eight) days.           Follow-up Information   Follow up with [facility], MD. Schedule an appointment as soon as possible for a visit in 2 weeks.   Specialty:  Cardiology   Contact information:   [address] [place] [address] [phone]       Follow up with Eilleen Kempf., MD In 1 week.   Specialty:  Oncology   Contact information:   [address] [place] [address] [phone]        Signed: [name] 10/04/2013, 6:58 PM

## 2013-10-05 ENCOUNTER — Encounter: Payer: Self-pay | Admitting: Physician Assistant

## 2013-10-05 ENCOUNTER — Ambulatory Visit: Payer: Medicare Other

## 2013-10-05 ENCOUNTER — Encounter: Payer: Self-pay | Admitting: *Deleted

## 2013-10-05 ENCOUNTER — Other Ambulatory Visit (HOSPITAL_BASED_OUTPATIENT_CLINIC_OR_DEPARTMENT_OTHER): Payer: Medicare Other

## 2013-10-05 ENCOUNTER — Ambulatory Visit (HOSPITAL_BASED_OUTPATIENT_CLINIC_OR_DEPARTMENT_OTHER): Payer: Medicare Other | Admitting: Physician Assistant

## 2013-10-05 VITALS — BP 88/42 | HR 104 | Temp 97.7°F | Resp 17 | Ht 66.0 in | Wt 118.5 lb

## 2013-10-05 DIAGNOSIS — C3490 Malignant neoplasm of unspecified part of unspecified bronchus or lung: Secondary | ICD-10-CM

## 2013-10-05 DIAGNOSIS — R5383 Other fatigue: Secondary | ICD-10-CM

## 2013-10-05 DIAGNOSIS — C7951 Secondary malignant neoplasm of bone: Secondary | ICD-10-CM

## 2013-10-05 DIAGNOSIS — C7952 Secondary malignant neoplasm of bone marrow: Secondary | ICD-10-CM

## 2013-10-05 DIAGNOSIS — C3430 Malignant neoplasm of lower lobe, unspecified bronchus or lung: Secondary | ICD-10-CM

## 2013-10-05 DIAGNOSIS — I319 Disease of pericardium, unspecified: Secondary | ICD-10-CM

## 2013-10-05 DIAGNOSIS — R5381 Other malaise: Secondary | ICD-10-CM

## 2013-10-05 LAB — RESEARCH LABS

## 2013-10-05 LAB — CBC WITH DIFFERENTIAL/PLATELET
BASO%: 0.3 % (ref 0.0–2.0)
Basophils Absolute: 0 10*3/uL (ref 0.0–0.1)
EOS ABS: 0.1 10*3/uL (ref 0.0–0.5)
EOS%: 1.2 % (ref 0.0–7.0)
HCT: 30.1 % — ABNORMAL LOW (ref 34.8–46.6)
HGB: 9.7 g/dL — ABNORMAL LOW (ref 11.6–15.9)
LYMPH%: 14.3 % (ref 14.0–49.7)
MCH: 26.3 pg (ref 25.1–34.0)
MCHC: 32.1 g/dL (ref 31.5–36.0)
MCV: 81.9 fL (ref 79.5–101.0)
MONO#: 0.8 10*3/uL (ref 0.1–0.9)
MONO%: 11.9 % (ref 0.0–14.0)
NEUT%: 72.3 % (ref 38.4–76.8)
NEUTROS ABS: 5.2 10*3/uL (ref 1.5–6.5)
Platelets: 362 10*3/uL (ref 145–400)
RBC: 3.68 10*6/uL — AB (ref 3.70–5.45)
RDW: 17 % — ABNORMAL HIGH (ref 11.2–14.5)
WBC: 7.2 10*3/uL (ref 3.9–10.3)
lymph#: 1 10*3/uL (ref 0.9–3.3)

## 2013-10-05 LAB — COMPREHENSIVE METABOLIC PANEL (CC13)
ALBUMIN: 3.3 g/dL — AB (ref 3.5–5.0)
ALK PHOS: 555 U/L — AB (ref 40–150)
ALT: 9 U/L (ref 0–55)
AST: 36 U/L — AB (ref 5–34)
Anion Gap: 12 mEq/L — ABNORMAL HIGH (ref 3–11)
BILIRUBIN TOTAL: 0.24 mg/dL (ref 0.20–1.20)
BUN: 9.8 mg/dL (ref 7.0–26.0)
CO2: 25 mEq/L (ref 22–29)
Calcium: 9 mg/dL (ref 8.4–10.4)
Chloride: 100 mEq/L (ref 98–109)
Creatinine: 1.1 mg/dL (ref 0.6–1.1)
GLUCOSE: 122 mg/dL (ref 70–140)
POTASSIUM: 3.8 meq/L (ref 3.5–5.1)
SODIUM: 137 meq/L (ref 136–145)
Total Protein: 7.4 g/dL (ref 6.4–8.3)

## 2013-10-05 LAB — LACTATE DEHYDROGENASE (CC13): LDH: 768 U/L — AB (ref 125–245)

## 2013-10-05 LAB — MAGNESIUM (CC13): Magnesium: 2.1 mg/dl (ref 1.5–2.5)

## 2013-10-05 LAB — PHOSPHORUS: PHOSPHORUS: 3.7 mg/dL (ref 2.3–4.6)

## 2013-10-05 NOTE — Progress Notes (Addendum)
[place] [phone]   [phone]  OFFICE PROGRESS NOTE  [name], MD [place] [address]  DIAGNOSIS: Recurrent non-small cell lung cancer initially diagnosed as stage IIA (T1 N1 MX) adenocarcinoma in April 2011.   PRIOR THERAPY:  1. Status post 3 cycles of neoadjuvant chemotherapy with carboplatin and Alimta. Last dose was given July 23, 2009. 2. Status post left lower lobectomy with lymph node dissection under the care of Dr. Arlyce Dice on October 17, 2009. 3. Status post concurrent chemoradiation with weekly carboplatin and paclitaxel for disease recurrence. Last dose was given May 07, 2010. 4. Status post 3 cycles of consolidation chemotherapy with carboplatin and Alimta. Last dose was given August 13, 2010. 5. Status post palliative radiotherapy to the right neck area. 6. Systemic chemotherapy with single agent Taxotere 75 mg/m2 every 3 weeks,, status post 6 cycles, last dose was given on 09/03/2011. 7. Systemic chemotherapy with carboplatin for an AUC of 5 and Alimta at 500 mg per meter square given every 3 weeks, status post 6 cycles. 8. Maintenance chemotherapy with single agent Alimta 500 mg/M2 every 3 weeks, status post 3 cycles discontinued today secondary to disease progression.  9. Systemic chemotherapy with single agent gemcitabine 1000 mg/M2 on days 1 and 8 every 3 weeks, status post 3 cycles discontinued today secondary to disease progression. First cycle on 03/15/2013.  CURRENT THERAPY:  1) Immunotherapy according to the BMS clinical trial with Nivolumab. Status post 7 cycles  2) Zometa 4 mg IV every month. First dose given 05/17/2013   CHEMOTHERAPY INTENT: Palliative  CURRENT # OF CHEMOTHERAPY CYCLES: 7  CURRENT ANTIEMETICS: Zofran, dexamethasone and Compazine  CURRENT SMOKING STATUS: Former smoker  ORAL CHEMOTHERAPY AND CONSENT: None  CURRENT BISPHOSPHONATES USE: None  PAIN MANAGEMENT:  None  NARCOTICS INDUCED CONSTIPATION: None  LIVING WILL AND CODE STATUS: Full code initially but no longer term resuscitation.    INTERVAL HISTORY: Kaitlin Reed 63 y.o. female returns to the clinic today for followup visit. She is accompanied by her sister who is visiting from [place]. Choice was recently hospitalized for pericarditis. She was discharged on call to seen 0.6 mg by mouth taken daily. She had restaging CT on 09/30/2013 and presents to discuss the results. She is status post 7 cycles of immunotherapy with Nivolumab according to the BMS clinical trial. She continues to complain of  fatigue and weakness as well as weight loss. She also continues to complain of hip pain. She is tired most of the time. She denied having any significant nausea or vomiting, no fever or chills. She has no significant chest pain, shortness of breath, cough or hemoptysis.   MEDICAL HISTORY: Past Medical History  Diagnosis Date  . Heart murmur   . 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 Anemia   . History of blood transfusion 1970's    "related to tubal pregnancy"   . Radiation 07/29/13-08/13/13    right partracheal lymph nodes  . lung ca dx'd 06/2009    chemo comp 07/2010; xrt comp 06/2010    ALLERGIES:  is allergic to codeine; motrin; other; trazodone and nefazodone; banana; tape; and xgeva.  MEDICATIONS:  Current Outpatient Prescriptions  Medication Sig Dispense Refill  . albuterol (PROAIR HFA) 108 (90 BASE) MCG/ACT inhaler Inhale 2 puffs into the lungs every 6 (six) hours as needed for wheezing or shortness of breath.  1 Inhaler  6  . Ascorbic Acid (VITAMIN C) 1000 MG tablet Take  1,000 mg by mouth daily.      . calcium carbonate (OS-CAL) 600 MG TABS Take 600 mg by mouth 2 (two) times daily with a meal.      . Cholecalciferol (VITAMIN D) 2000 UNITS tablet Take 2,000 Units by mouth daily.      . colchicine 0.6 MG tablet Take 1 tablet (0.6 mg total) by mouth daily.  30 tablet  3  . dronabinol (MARINOL) 2.5 MG  capsule Take 1 capsule (2.5 mg total) by mouth 2 (two) times daily before a meal.  60 capsule  0  . ferrous sulfate 325 (65 FE) MG tablet Take 1 tablet (325 mg total) by mouth 2 (two) times daily with a meal.      . hyaluronate sodium (RADIAPLEXRX) GEL Apply 1 application topically 2 (two) times daily.      Marland Kitchen HYDROmorphone (DILAUDID) 2 MG tablet Take 0.5 tablets (1 mg total) by mouth every 6 (six) hours as needed for severe pain.  30 tablet  0  . metoCLOPramide (REGLAN) 5 MG/5ML solution Take 5 mLs (5 mg total) by mouth 2 (two) times daily before a meal.  120 mL  3  . metoprolol tartrate (LOPRESSOR) 25 MG tablet Take 0.5 tablets (12.5 mg total) by mouth 2 (two) times daily.      . Multiple Vitamin (MULITIVITAMIN WITH MINERALS) TABS Take 1 tablet by mouth daily.       . nivolumab 3 mg/kg in sodium chloride 0.9 % 100 mL Inject 3 mg/kg into the vein every 14 (fourteen) days.      Marland Kitchen NIVOLUMAB IV Inject 3 mg/kg into the vein every 14 (fourteen) days. *iv infusion that runs for 33min*      . pantoprazole sodium (PROTONIX) 40 mg/20 mL PACK Take 10 mLs (20 mg total) by mouth daily at 12 noon.  30 each  3  . potassium chloride (K-DUR,KLOR-CON) 10 MEQ tablet Take 1 tablet (10 mEq total) by mouth 2 (two) times daily.  60 tablet  3  . Selenium 200 MCG CAPS Take 200 mcg by mouth daily.       . temazepam (RESTORIL) 15 MG capsule Take 15 mg by mouth at bedtime as needed for sleep.      . vitamin B-12 (CYANOCOBALAMIN) 1000 MCG tablet Take 1,000 mcg by mouth daily.      . vitamin E 400 UNIT capsule Take 400 Units by mouth daily.      . Zoledronic Acid (ZOMETA) 4 MG/100ML IVPB Inject 4 mg into the vein every 28 (twenty-eight) days.      . [DISCONTINUED] Alum & Mag Hydroxide-Simeth (MAGIC MOUTHWASH W/LIDOCAINE) SOLN Take 10 mLs by mouth 4 (four) times daily as needed.  500 mL  5   No current facility-administered medications for this visit.    SURGICAL HISTORY:  Past Surgical History  Procedure Laterality Date   . Lung removal, partial Left 10/17/09    pt states they removed small piece of lung due to cancer  . 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 Insertion central venous access device w/ subcutaneous port Right 05/2013    "PowerPort"  . Video bronchoscopy Bilateral 07/23/2013    Procedure: VIDEO BRONCHOSCOPY WITHOUT FLUORO;  Surgeon: Doree Fudge, MD;  Location: [facility];  Service: Cardiopulmonary;  Laterality: Bilateral;    REVIEW OF SYSTEMS:  Constitutional: positive for anorexia, fatigue and weight loss Eyes: negative Ears, nose, mouth, throat, and face: negative Respiratory: negative Cardiovascular: negative Gastrointestinal: negative Genitourinary:negative  Integument/breast: negative Hematologic/lymphatic: negative Musculoskeletal:negative Neurological: negative Behavioral/Psych: negative Endocrine: negative Allergic/Immunologic: negative   PHYSICAL EXAMINATION: General appearance: alert, cooperative and no distress Head: Normocephalic, without obvious abnormality, atraumatic Neck: no adenopathy Lymph nodes: Cervical, supraclavicular, and axillary nodes normal. Resp: clear to auscultation bilaterally Cardio: regular rate and rhythm, S1, S2 normal, no murmur, click, rub or gallop GI: soft, non-tender; bowel sounds normal; no masses,  no organomegaly Extremities: extremities normal, atraumatic, no cyanosis or edema Neurologic: Alert and oriented X 3, normal strength and tone. Normal symmetric reflexes. Normal coordination and gait  ECOG PERFORMANCE STATUS: 1 - Symptomatic but completely ambulatory  Blood pressure 88/42, pulse 104, temperature 97.7 F (36.5 C), temperature source Oral, resp. rate 17, height 5\' 6"  (1.676 m), weight 118 lb 8 oz (53.751 kg), SpO2 100.00%.  LABORATORY DATA: Lab Results  Component Value Date   WBC 7.2 10/05/2013   HGB 9.7* 10/05/2013   HCT 30.1* 10/05/2013   MCV 81.9 10/05/2013   PLT 362 10/05/2013      Chemistry       Component Value Date/Time   NA 137 10/05/2013 1443   NA 134* 10/01/2013 0605   NA 138 11/06/2011 1121   K 3.8 10/05/2013 1443   K 4.4 10/01/2013 0605   K 4.3 11/06/2011 1121   CL 96 10/01/2013 0605   CL 105 10/07/2012 1350   CL 98 11/06/2011 1121   CO2 25 10/05/2013 1443   CO2 24 10/01/2013 0605   CO2 30 11/06/2011 1121   BUN 9.8 10/05/2013 1443   BUN 11 10/01/2013 0605   BUN 15 11/06/2011 1121   CREATININE 1.1 10/05/2013 1443   CREATININE 0.77 10/01/2013 0605   CREATININE 0.9 11/06/2011 1121      Component Value Date/Time   CALCIUM 9.0 10/05/2013 1443   CALCIUM 8.7 10/01/2013 0605   CALCIUM 9.0 11/06/2011 1121   ALKPHOS 555* 10/05/2013 1443   ALKPHOS 512* 09/30/2013 0804   ALKPHOS 110* 11/06/2011 1121   AST 36* 10/05/2013 1443   AST 36 09/30/2013 0804   AST 36 11/06/2011 1121   ALT 9 10/05/2013 1443   ALT 11 09/30/2013 0804   ALT 23 11/06/2011 1121   BILITOT 0.24 10/05/2013 1443   BILITOT 0.3 09/30/2013 0804   BILITOT 0.50 11/06/2011 1121       RADIOGRAPHIC STUDIES: Ct Angio Chest Pe W/cm &/or Wo Cm  09/30/2013   CLINICAL DATA:  Chest pain, evaluate for PE. Lung cancer diagnosed 2011, status post resection. Shortness of breath, nausea/vomiting, chemotherapy ongoing.  EXAM: CT ANGIOGRAPHY CHEST  CT ABDOMEN AND PELVIS WITH CONTRAST  TECHNIQUE: Multidetector CT imaging of the chest was performed using the standard protocol during bolus administration of intravenous contrast. Multiplanar CT image reconstructions and MIPs were obtained to evaluate the vascular anatomy. Multidetector CT imaging of the abdomen and pelvis was performed using the standard protocol during bolus administration of intravenous contrast.  CONTRAST:  144mL OMNIPAQUE IOHEXOL 350 MG/ML SOLN  COMPARISON:  CT chest abdomen pelvis dated 08/03/2013  RECIST 1.1  Target Lesions:  1. Medial left upper lobe lung nodule - measures 17 mm (series 9/image 48), previously 16 mm 2. Prevascular lymph node - measures 16 mm (series 4/ image 29), previously 20 mm 3.  Mediastinal nodal mass - poorly visualized, measures approximately 28 mm (series 4/ image 29), previously 45 mm 4. Right adrenal nodule - measures 36 mm (series 5/image 19), previously 37 mm Non-target Lesions:  1. Pericardial effusion - present (series 4/image 68), unchanged  2. Sclerotic T11 lesion - present (series 4/ image 76), minimally increased 3. Destructive left iliac bone lesion - present (series 5/image 52), grossly unchanged 4. Sclerotic left sacral lesion - present (series 5/image 15), unchanged  FINDINGS: CTA CHEST FINDINGS  No evidence of pulmonary embolism.  Status post left lower lobectomy. Radiation changes in the posterior left upper lobe/perihilar region.  1.7 x 1.4 cm nodular opacity medially in the anterior left upper lobe (series 9/image 48), previously 1.6 x 1.3 cm, suspicious for metastasis.  8 x 6 mm nodule in the medial right lower lobe (series 9/image 55), previously 4 mm, suspicious for metastases. Additional small 3-4 mm nodules in the medial right lower lobe (series 9/ images 51 and 54).  Minimal subpleural nodularity in the anterior right upper lobe, more conspicuous than on the prior study, although nonspecific. Moderate to severe centrilobular and paraseptal emphysematous changes.  Visualized thyroid is unremarkable.  Heart is normal in size. Small pericardial effusion, unchanged. Atherosclerotic calcifications of the aortic arch.  Right chest port terminating at the cavoatrial junction.  Thoracic lymphadenopathy, including:  --7 mm short axis left supraclavicular node (series 4/ image 10), previously 5 mm  --1.4 cm short axis right prevascular/retrosternal node (series 4/ image 159), previously 1.6 cm  --Right paratracheal nodal mass, poorly visualized but measuring approximately 2.8 x 2.1 cm (series 4/ image 159), previously 4.9 x 4.5 cm  --1.5 cm short axis left prevascular node (series 4/ image 32), previously 6 mm  Widespread osseous metastases in the visualized axial and  appendicular skeleton, progressed. For example, age [age] x 2.2 cm lesion in the left T10 vertebral body (series 4/image 69) previously measured 1.4 x 1.5 cm.  CT ABDOMEN and PELVIS FINDINGS  Liver, spleen, pancreas, and left adrenal gland are within normal limits.  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  Small bilateral renal cysts measuring up to 10 mm in the left lower pole (series 5/ image 33). No hydronephrosis.  No evidence of bowel obstruction. Normal appendix. Colonic diverticulosis, without associated inflammatory changes.  Atherosclerotic calcifications of the abdominal aorta and branch vessels.  No abdominopelvic ascites.  Upper abdominal/ retroperitoneal lymphadenopathy, including  --2.1 cm short axis portacaval node (series 5/ image 19), unchanged  --2.4 cm short axis right para-aortic node (series 5/ image 156), previously 2.0 cm  10 x 14 mm soft tissue implant along the posterior aspect of the left psoas muscle (series 5/image 49), previously 9 x 10 mm. Additional intramuscular metastases in the left pelvis (series 5/image 50, 51, and 44), mildly progressed.  Uterus is notable for probable uterine fibroids. Bilateral ovaries are grossly unremarkable.  Bladder is within normal limits.  Widespread sclerotic osseous metastases throughout the visualized axial and appendicular skeleton, including a permeative/destructive lesion involving the left iliac bone. Mild progression is suspected, including a 1.7 x 2.4 cm anterior right sacral lesion (series 5/ image 52), previously 1.5 x 1.9 cm.  Review of the MIP images confirms the above findings.  IMPRESSION: No evidence of pulmonary embolism.  Status post left lower lobectomy with radiation changes in the posterior left upper lung/perihilar region.  Progression of bilateral pulmonary and widespread osseous metastases, as above.  Mixed response of thoracoabdominal nodal metastases, with decrease in size of the dominant right  paratracheal nodal mass, but mild progression elsewhere.  Soft tissue/intramuscular metastases in the left pelvis, mildly progressed.  RECIST 1.1 measurements as above.  These results were called by telephone at the time of  interpretation on 09/30/2013 at 9:56 AM to Dr. Shirlyn Goltz , who verbally acknowledged these results.   Electronically Signed   By: Julian Hy M.D.   On: 09/30/2013 10:44   Ct Abdomen Pelvis W Contrast  09/30/2013   CLINICAL DATA:  Chest pain, evaluate for PE. Lung cancer diagnosed 2011, status post resection. Shortness of breath, nausea/vomiting, chemotherapy ongoing.  EXAM: CT ANGIOGRAPHY CHEST  CT ABDOMEN AND PELVIS WITH CONTRAST  TECHNIQUE: Multidetector CT imaging of the chest was performed using the standard protocol during bolus administration of intravenous contrast. Multiplanar CT image reconstructions and MIPs were obtained to evaluate the vascular anatomy. Multidetector CT imaging of the abdomen and pelvis was performed using the standard protocol during bolus administration of intravenous contrast.  CONTRAST:  170mL OMNIPAQUE IOHEXOL 350 MG/ML SOLN  COMPARISON:  CT chest abdomen pelvis dated 08/03/2013  RECIST 1.1  Target Lesions:  1. Medial left upper lobe lung nodule - measures 17 mm (series 9/image 48), previously 16 mm 2. Prevascular lymph node - measures 16 mm (series 4/ image 769), previously 20 mm 3. Mediastinal nodal mass - poorly visualized, measures approximately 28 mm (series 4/ image 769), previously 45 mm 4. Right adrenal nodule - measures 36 mm (series 5/image 19), previously 37 mm Non-target Lesions:  1. Pericardial effusion - present (series 4/image 68), unchanged 2. Sclerotic T11 lesion - present (series 4/ image 76), minimally increased 3. Destructive left iliac bone lesion - present (series 5/image 52), grossly unchanged 4. Sclerotic left sacral lesion - present (series 5/image 76), unchanged  FINDINGS: CTA CHEST FINDINGS  No evidence of pulmonary embolism.   Status post left lower lobectomy. Radiation changes in the posterior left upper lobe/perihilar region.  1.7 x 1.4 cm nodular opacity medially in the anterior left upper lobe (series 9/image 48), previously 1.6 x 1.3 cm, suspicious for metastasis.  8 x 6 mm nodule in the medial right lower lobe (series 9/image 55), previously 4 mm, suspicious for metastases. Additional small 3-4 mm nodules in the medial right lower lobe (series 9/ images 51 and 54).  Minimal subpleural nodularity in the anterior right upper lobe, more conspicuous than on the prior study, although nonspecific. Moderate to severe centrilobular and paraseptal emphysematous changes.  Visualized thyroid is unremarkable.  Heart is normal in size. Small pericardial effusion, unchanged. Atherosclerotic calcifications of the aortic arch.  Right chest port terminating at the cavoatrial junction.  Thoracic lymphadenopathy, including:  --7 mm short axis left supraclavicular node (series 4/ image 10), previously 5 mm  --1.4 cm short axis right prevascular/retrosternal node (series 4/ image 769), previously 1.6 cm  --Right paratracheal nodal mass, poorly visualized but measuring approximately 2.8 x 2.1 cm (series 4/ image 769), previously 4.9 x 4.5 cm  --1.5 cm short axis left prevascular node (series 4/ image 32), previously 6 mm  Widespread osseous metastases in the visualized axial and appendicular skeleton, progressed. For example, age 76.5 x 2.2 cm lesion in the left T10 vertebral body (series 4/image 69) previously measured 1.4 x 1.5 cm.  CT ABDOMEN and PELVIS FINDINGS  Liver, spleen, pancreas, and left adrenal gland are within normal limits.  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  Small bilateral renal cysts measuring up to 10 mm in the left lower pole (series 5/ image 33). No hydronephrosis.  No evidence of bowel obstruction. Normal appendix. Colonic diverticulosis, without associated inflammatory changes.   Atherosclerotic calcifications of the abdominal aorta and branch vessels.  No abdominopelvic  ascites.  Upper abdominal/ retroperitoneal lymphadenopathy, including  --2.1 cm short axis portacaval node (series 5/ image 19), unchanged  --2.4 cm short axis right para-aortic node (series 5/ image 26), previously 2.0 cm  10 x 14 mm soft tissue implant along the posterior aspect of the left psoas muscle (series 5/image 49), previously 9 x 10 mm. Additional intramuscular metastases in the left pelvis (series 5/image 50, 51, and 44), mildly progressed.  Uterus is notable for probable uterine fibroids. Bilateral ovaries are grossly unremarkable.  Bladder is within normal limits.  Widespread sclerotic osseous metastases throughout the visualized axial and appendicular skeleton, including a permeative/destructive lesion involving the left iliac bone. Mild progression is suspected, including a 1.7 x 2.4 cm anterior right sacral lesion (series 5/ image 52), previously 1.5 x 1.9 cm.  Review of the MIP images confirms the above findings.  IMPRESSION: No evidence of pulmonary embolism.  Status post left lower lobectomy with radiation changes in the posterior left upper lung/perihilar region.  Progression of bilateral pulmonary and widespread osseous metastases, as above.  Mixed response of thoracoabdominal nodal metastases, with decrease in size of the dominant right paratracheal nodal mass, but mild progression elsewhere.  Soft tissue/intramuscular metastases in the left pelvis, mildly progressed.  RECIST 1.1 measurements as above.  These results were called by telephone at the time of interpretation on 09/30/2013 at 9:56 AM to Dr. Shirlyn Goltz , who verbally acknowledged these results.   Electronically Signed   By: Julian Hy M.D.   On: 09/30/2013 10:44      ASSESSMENT AND PLAN: This is a very pleasant 63 years old [place] American female with recurrent non-small cell lung cancer.  She status post 3 cycle of systemic  chemotherapy with single agent gemcitabine recently but discontinued secondary to disease progression. She is currently on immunotherapy with Nivolumab status post 7 cycles and tolerated her treatment well except for increasing fatigue and weakness. Her thyroid function tests from 09/21/2013 revealed a TSH of 2.919. Patient was discussed with and also seen by Dr. Julien Nordmann. Her imaging studies revealed no evidence of pulmonary embolism, progression of bilateral pulmonary widespread osseous metastasis. There is mixed response of thoracoabdominal nodal metastasis with decrease in size of the dominant right pericardial nodal mass but mild progression elsewhere. There's soft tissue/intramuscular metastasis in the left pelvis mildly progressed. As she is clear disease progression she will discontinue her participation in the BMS clinical trial with Nivolumab. For the continued hip pain which correlates with the increase in osseous metastasis within the pelvis, the patient will be referred to Dr. Pablo Ledger her palliative radiation therapy for pain control. She will have a break for the next 2 weeks when she will return to see Dr. Julien Nordmann to begin systemic chemotherapy with navelbine.  She was advised to call immediately if she has any concerning symptoms in the interval. The patient voices understanding of current disease status and treatment options and is in agreement with the current care plan.  All questions were answered. The patient knows to call the clinic with any problems, questions or concerns. We can certainly see the patient much sooner if necessary.  Carlton Adam, PA_C  ADDENDUM: Hematology/Oncology Attending:  I had a face to face encounter with the patient. I recommended her care plan. This is a very pleasant 63 years old [place] American female with recurrent non-small cell lung cancer, adenocarcinoma diagnosed in April 2011. She is status post several chemotherapy regimens and most recently  treated with immunotherapy with Nivolumab  according to the BMS clinical trial. She was recently admitted to [facility] with acute pericarditis which is not clear if it's related to her recent immunotherapy or the prior radiation. Her recent CT scan of the chest also showed evidence for disease progression of bilateral pulmonary and widespread osseous metastasis. There was decrease in the dominant right paratracheal lung mass by this is most likely secondary to the recent radiation therapy. I have a lengthy discussion with the patient and her sister today about her current disease status and treatment options. I recommended for her to discontinue her current treatment with Nivolumab because of the significant evidence for disease progression and the recent pericarditis. I recommended for her to a chemotherapy regimen consisting of Navelbine 25 mg/M2 every 2 weeks. She was also given him the option of palliative care and hospice referral but she is interested in proceeding with chemotherapy. I discussed with the patient adverse effect of this treatment including but not limited to alopecia, myelosuppression, nausea and vomiting, peripheral neuropathy, liver or renal dysfunction. She would like to proceed with treatment as planned and she is expected to start the first cycle of this treatment in 2 weeks to give her more time to recover from her recent pericarditis and increasing fatigue and weakness. The patient was advised to call immediately if she has any concerning symptoms in the interval  Disclaimer: This note was dictated with voice recognition software. Similar sounding words can inadvertently be transcribed and may not be corrected upon review. Eilleen Kempf., MD 10/09/2013

## 2013-10-05 NOTE — Progress Notes (Unsigned)
10/05/13 BMS [id number] End of treatment Kaitlin Reed is in the cancer center today for lab work, physical exam, and cycle 8.

## 2013-10-07 NOTE — Patient Instructions (Signed)
Your CT scans have revealed evidence of disease progression and therefore your participation in the clinical problem with Nivolumab will be discontinued We're referring you to Dr. Pablo Ledger for radiation to your hip and pelvis for pain control Followup with Dr. Julien Nordmann in 2 weeks to begin chemotherapy with Navelbine

## 2013-10-08 ENCOUNTER — Ambulatory Visit: Admission: RE | Admit: 2013-10-08 | Payer: Medicare Other | Source: Ambulatory Visit | Admitting: Radiation Oncology

## 2013-10-12 ENCOUNTER — Ambulatory Visit
Admission: RE | Admit: 2013-10-12 | Discharge: 2013-10-12 | Disposition: A | Discharge status: Home / Self Care | Payer: Medicare Other | Source: Ambulatory Visit | Attending: Radiation Oncology | Admitting: Radiation Oncology

## 2013-10-12 DIAGNOSIS — C7952 Secondary malignant neoplasm of bone marrow: Secondary | ICD-10-CM

## 2013-10-12 DIAGNOSIS — C3490 Malignant neoplasm of unspecified part of unspecified bronchus or lung: Secondary | ICD-10-CM | POA: Insufficient documentation

## 2013-10-12 DIAGNOSIS — C7951 Secondary malignant neoplasm of bone: Secondary | ICD-10-CM | POA: Insufficient documentation

## 2013-10-12 DIAGNOSIS — Z510 Encounter for antineoplastic radiation therapy: Secondary | ICD-10-CM | POA: Insufficient documentation

## 2013-10-12 NOTE — Progress Notes (Signed)
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  Identity was confirmed.  All relevant records and images related to the planned course of therapy were reviewed.  Then, the patient was positioned in a stable reproducible clinical set-up for radiation therapy.  CT images were obtained.  An isocenter was placed. Skin markings were placed.  The CT images were loaded into the planning software where the target and avoidance structures were contoured.  The radiation prescription was entered and confirmed. The patient was discharged in stable condition and tolerated simulation well.    TREATMENT PLANNING NOTE:  Treatment planning then occurred. I have requested : MLC's, isodose plan, basic dose calculation  I have requested 3 dimensional simulation with DVH of cord, bowel and GTV

## 2013-10-14 ENCOUNTER — Ambulatory Visit
Admission: RE | Admit: 2013-10-14 | Discharge: 2013-10-14 | Disposition: A | Discharge status: Home / Self Care | Payer: Medicare Other | Source: Ambulatory Visit | Attending: Radiation Oncology | Admitting: Radiation Oncology

## 2013-10-14 DIAGNOSIS — C3490 Malignant neoplasm of unspecified part of unspecified bronchus or lung: Secondary | ICD-10-CM

## 2013-10-14 NOTE — Progress Notes (Signed)
  Radiation Oncology         [phone] ________________________________  Name: Kaitlin Reed MRN: [id number]  Date: 10/14/2013  DOB: 04-20-1951  Simulation Verification Note  Status: outpatient  NARRATIVE: The patient was brought to the treatment unit and placed in the planned treatment position. The clinical setup was verified. Then port films were obtained and uploaded to the radiation oncology medical record software.  The treatment beams were carefully compared against the planned radiation fields. The position location and shape of the radiation fields was reviewed. The targeted volume of tissue appears appropriately covered by the radiation beams. Organs at risk appear to be excluded as planned.  Based on my personal review, I approved the simulation verification. The patient's treatment will proceed as planned.  ------------------------------------------------  Thea Silversmith, MD

## 2013-10-15 ENCOUNTER — Observation Stay (HOSPITAL_COMMUNITY)
Admission: EM | Admit: 2013-10-15 | Discharge: 2013-10-17 | Disposition: A | Discharge status: Home / Self Care | Payer: Medicare Other | Attending: Cardiovascular Disease | Admitting: Cardiovascular Disease

## 2013-10-15 ENCOUNTER — Other Ambulatory Visit (HOSPITAL_COMMUNITY): Payer: Medicare Other

## 2013-10-15 ENCOUNTER — Encounter (HOSPITAL_COMMUNITY): Payer: Self-pay | Admitting: Emergency Medicine

## 2013-10-15 ENCOUNTER — Emergency Department (HOSPITAL_COMMUNITY): Payer: Medicare Other

## 2013-10-15 ENCOUNTER — Ambulatory Visit
Admission: RE | Admit: 2013-10-15 | Discharge: 2013-10-15 | Disposition: A | Discharge status: Home / Self Care | Payer: Medicare Other | Source: Ambulatory Visit | Attending: Radiation Oncology | Admitting: Radiation Oncology

## 2013-10-15 DIAGNOSIS — R079 Chest pain, unspecified: Principal | ICD-10-CM | POA: Diagnosis present

## 2013-10-15 DIAGNOSIS — Z85118 Personal history of other malignant neoplasm of bronchus and lung: Secondary | ICD-10-CM | POA: Insufficient documentation

## 2013-10-15 DIAGNOSIS — D649 Anemia, unspecified: Secondary | ICD-10-CM | POA: Insufficient documentation

## 2013-10-15 DIAGNOSIS — C3492 Malignant neoplasm of unspecified part of left bronchus or lung: Secondary | ICD-10-CM

## 2013-10-15 DIAGNOSIS — E785 Hyperlipidemia, unspecified: Secondary | ICD-10-CM

## 2013-10-15 DIAGNOSIS — R011 Cardiac murmur, unspecified: Secondary | ICD-10-CM | POA: Insufficient documentation

## 2013-10-15 LAB — CBC WITH DIFFERENTIAL/PLATELET
BASOS ABS: 0 10*3/uL (ref 0.0–0.1)
Basophils Relative: 0 % (ref 0–1)
Eosinophils Absolute: 0 10*3/uL (ref 0.0–0.7)
Eosinophils Relative: 0 % (ref 0–5)
HEMATOCRIT: 29.3 % — AB (ref 36.0–46.0)
Hemoglobin: 9.5 g/dL — ABNORMAL LOW (ref 12.0–15.0)
LYMPHS ABS: 1.1 10*3/uL (ref 0.7–4.0)
Lymphocytes Relative: 14 % (ref 12–46)
MCH: 25.8 pg — ABNORMAL LOW (ref 26.0–34.0)
MCHC: 32.4 g/dL (ref 30.0–36.0)
MCV: 79.6 fL (ref 78.0–100.0)
Monocytes Absolute: 0.7 10*3/uL (ref 0.1–1.0)
Monocytes Relative: 9 % (ref 3–12)
NEUTROS ABS: 6.1 10*3/uL (ref 1.7–7.7)
Neutrophils Relative %: 77 % (ref 43–77)
PLATELETS: 379 10*3/uL (ref 150–400)
RBC: 3.68 MIL/uL — ABNORMAL LOW (ref 3.87–5.11)
RDW: 15.7 % — AB (ref 11.5–15.5)
WBC: 7.9 10*3/uL (ref 4.0–10.5)

## 2013-10-15 LAB — I-STAT TROPONIN, ED: Troponin i, poc: 0 ng/mL (ref 0.00–0.08)

## 2013-10-15 LAB — COMPREHENSIVE METABOLIC PANEL
ALK PHOS: 535 U/L — AB (ref 39–117)
ALT: 10 U/L (ref 0–35)
AST: 42 U/L — AB (ref 0–37)
Albumin: 3.4 g/dL — ABNORMAL LOW (ref 3.5–5.2)
BILIRUBIN TOTAL: 0.3 mg/dL (ref 0.3–1.2)
BUN: 15 mg/dL (ref 6–23)
CHLORIDE: 91 meq/L — AB (ref 96–112)
CO2: 16 meq/L — AB (ref 19–32)
Calcium: 9.4 mg/dL (ref 8.4–10.5)
Creatinine, Ser: 0.73 mg/dL (ref 0.50–1.10)
GFR calc Af Amer: >90 mL/min (ref >90)
GFR calc non Af Amer: 88 mL/min — ABNORMAL LOW (ref >90)
GLUCOSE: 95 mg/dL (ref 70–99)
Potassium: 3.9 mEq/L (ref 3.7–5.3)
Sodium: 132 mEq/L — ABNORMAL LOW (ref 137–147)
Total Protein: 7.6 g/dL (ref 6.0–8.3)

## 2013-10-15 LAB — URINE MICROSCOPIC-ADD ON

## 2013-10-15 LAB — URINALYSIS, ROUTINE W REFLEX MICROSCOPIC
GLUCOSE, UA: NEGATIVE mg/dL
HGB URINE DIPSTICK: NEGATIVE
Nitrite: NEGATIVE
PH: 5.5 (ref 5.0–8.0)
Protein, ur: 30 mg/dL — AB
Specific Gravity, Urine: 1.023 (ref 1.005–1.030)
Urobilinogen, UA: 0.2 mg/dL (ref 0.0–1.0)

## 2013-10-15 MED ORDER — FERROUS SULFATE 325 (65 FE) MG PO TABS
325.0000 mg | ORAL_TABLET | Freq: Two times a day (BID) | ORAL | Status: DC
  Administered 2013-10-16 – 2013-10-17 (×3): 325 mg via ORAL
  Filled 2013-10-15 (×5): qty 1

## 2013-10-15 MED ORDER — ONDANSETRON HCL 4 MG/2ML IJ SOLN
4.0000 mg | Freq: Four times a day (QID) | INTRAMUSCULAR | Status: DC | PRN
  Administered 2013-10-15 – 2013-10-16 (×2): 4 mg via INTRAVENOUS
  Filled 2013-10-15 (×2): qty 2

## 2013-10-15 MED ORDER — SODIUM CHLORIDE 0.9 % IV SOLN: 250.0000 mL | INTRAVENOUS | Status: DC | PRN

## 2013-10-15 MED ORDER — ADULT MULTIVITAMIN W/MINERALS CH
1.0000 | ORAL_TABLET | Freq: Every day | ORAL | Status: DC
  Administered 2013-10-15 – 2013-10-16 (×2): 1 via ORAL
  Filled 2013-10-15 (×3): qty 1

## 2013-10-15 MED ORDER — SODIUM CHLORIDE 0.9 % IJ SOLN: 3.0000 mL | INTRAMUSCULAR | Status: DC | PRN

## 2013-10-15 MED ORDER — HYDROMORPHONE HCL 2 MG PO TABS
1.0000 mg | ORAL_TABLET | Freq: Four times a day (QID) | ORAL | Status: DC | PRN
  Administered 2013-10-15 – 2013-10-16 (×3): 1 mg via ORAL
  Filled 2013-10-15 (×3): qty 1

## 2013-10-15 MED ORDER — DRONABINOL 2.5 MG PO CAPS
2.5000 mg | ORAL_CAPSULE | Freq: Two times a day (BID) | ORAL | Status: DC
  Administered 2013-10-16 – 2013-10-17 (×3): 2.5 mg via ORAL
  Filled 2013-10-15 (×3): qty 1

## 2013-10-15 MED ORDER — SODIUM CHLORIDE 0.9 % IJ SOLN
3.0000 mL | Freq: Two times a day (BID) | INTRAMUSCULAR | Status: DC
  Administered 2013-10-15: 3 mL via INTRAVENOUS

## 2013-10-15 MED ORDER — DOCUSATE SODIUM 100 MG PO CAPS
100.0000 mg | ORAL_CAPSULE | Freq: Two times a day (BID) | ORAL | Status: DC
  Administered 2013-10-16 (×2): 100 mg via ORAL
  Filled 2013-10-15 (×5): qty 1

## 2013-10-15 MED ORDER — ONDANSETRON HCL 4 MG PO TABS
4.0000 mg | ORAL_TABLET | Freq: Four times a day (QID) | ORAL | Status: DC | PRN
  Administered 2013-10-16: 4 mg via ORAL
  Filled 2013-10-15: qty 1

## 2013-10-15 MED ORDER — VITAMIN B-12 1000 MCG PO TABS
1000.0000 ug | ORAL_TABLET | Freq: Every day | ORAL | Status: DC
  Administered 2013-10-15 – 2013-10-16 (×2): 1000 ug via ORAL
  Filled 2013-10-15 (×3): qty 1

## 2013-10-15 MED ORDER — SODIUM CHLORIDE 0.9 % IV BOLUS (SEPSIS)
500.0000 mL | Freq: Once | INTRAVENOUS | Status: AC
  Administered 2013-10-15: 500 mL via INTRAVENOUS

## 2013-10-15 MED ORDER — HYDROMORPHONE HCL PF 1 MG/ML IJ SOLN
0.5000 mg | Freq: Once | INTRAMUSCULAR | Status: AC
  Administered 2013-10-15: 0.5 mg via INTRAVENOUS
  Filled 2013-10-15: qty 1

## 2013-10-15 MED ORDER — COLCHICINE 0.6 MG PO TABS
0.6000 mg | ORAL_TABLET | Freq: Every day | ORAL | Status: DC
  Administered 2013-10-15 – 2013-10-16 (×2): 0.6 mg via ORAL
  Filled 2013-10-15 (×3): qty 1

## 2013-10-15 MED ORDER — CALCIUM CARBONATE 600 MG PO TABS
600.0000 mg | ORAL_TABLET | Freq: Two times a day (BID) | ORAL | Status: DC
  Filled 2013-10-15 (×3): qty 1

## 2013-10-15 MED ORDER — IOHEXOL 300 MG/ML  SOLN: 50.0000 mL | Freq: Once | INTRAMUSCULAR | Status: AC | PRN

## 2013-10-15 MED ORDER — POTASSIUM CHLORIDE CRYS ER 10 MEQ PO TBCR
10.0000 meq | EXTENDED_RELEASE_TABLET | Freq: Two times a day (BID) | ORAL | Status: DC
  Administered 2013-10-15 – 2013-10-16 (×3): 10 meq via ORAL
  Filled 2013-10-15 (×5): qty 1

## 2013-10-15 MED ORDER — ONDANSETRON HCL 4 MG/2ML IJ SOLN
4.0000 mg | Freq: Once | INTRAMUSCULAR | Status: AC
  Administered 2013-10-15: 4 mg via INTRAVENOUS
  Filled 2013-10-15: qty 2

## 2013-10-15 MED ORDER — VITAMIN D 1000 UNITS PO TABS
2000.0000 [IU] | ORAL_TABLET | Freq: Every day | ORAL | Status: DC
  Administered 2013-10-15 – 2013-10-16 (×2): 2000 [IU] via ORAL
  Filled 2013-10-15 (×3): qty 2

## 2013-10-15 MED ORDER — METOPROLOL TARTRATE 12.5 MG HALF TABLET
12.5000 mg | ORAL_TABLET | Freq: Two times a day (BID) | ORAL | Status: DC
  Administered 2013-10-15 – 2013-10-16 (×3): 12.5 mg via ORAL
  Filled 2013-10-15 (×5): qty 1

## 2013-10-15 MED ORDER — TEMAZEPAM 15 MG PO CAPS
15.0000 mg | ORAL_CAPSULE | Freq: Every evening | ORAL | Status: DC | PRN
  Administered 2013-10-15 – 2013-10-16 (×2): 15 mg via ORAL
  Filled 2013-10-15 (×2): qty 1

## 2013-10-15 MED ORDER — VITAMIN E 180 MG (400 UNIT) PO CAPS
400.0000 [IU] | ORAL_CAPSULE | Freq: Every day | ORAL | Status: DC
  Administered 2013-10-15 – 2013-10-16 (×2): 400 [IU] via ORAL
  Filled 2013-10-15 (×3): qty 1

## 2013-10-15 MED ORDER — PANTOPRAZOLE SODIUM 40 MG PO TBEC
40.0000 mg | DELAYED_RELEASE_TABLET | Freq: Every day | ORAL | Status: DC
  Administered 2013-10-15 – 2013-10-16 (×2): 40 mg via ORAL
  Filled 2013-10-15 (×3): qty 1

## 2013-10-15 MED ORDER — METOCLOPRAMIDE HCL 5 MG/5ML PO SOLN
5.0000 mg | Freq: Two times a day (BID) | ORAL | Status: DC
  Administered 2013-10-16 – 2013-10-17 (×3): 5 mg via ORAL
  Filled 2013-10-15 (×5): qty 5

## 2013-10-15 MED ORDER — VITAMIN C 500 MG PO TABS
1000.0000 mg | ORAL_TABLET | Freq: Every day | ORAL | Status: DC
  Administered 2013-10-15 – 2013-10-16 (×2): 1000 mg via ORAL
  Filled 2013-10-15 (×3): qty 2

## 2013-10-15 MED ORDER — ALBUTEROL SULFATE HFA 108 (90 BASE) MCG/ACT IN AERS: 2.0000 | INHALATION_SPRAY | Freq: Four times a day (QID) | RESPIRATORY_TRACT | Status: DC | PRN

## 2013-10-15 NOTE — ED Notes (Signed)
Spoke to Dr. Doylene Canard, states med-surg bed is appropriate for pt.

## 2013-10-15 NOTE — ED Provider Notes (Signed)
CSN: [id number]     Arrival date & time 10/15/13  1507 History   First MD Initiated Contact with Patient 10/15/13 1510     Chief Complaint  Patient presents with  . Chest Pain   HPI Comments: Patient is a 63 y.o. Female with a history of non-small cell lung cancer who is currently undergoing palliative radiation presents to the [facility] ED with 30 minute history of chest pain.  Patient states that the pain is like a squeezing 7/10 pain which she feels is internal.  She states that the pain initially started in her back and then moved to her chest.  Pain is associated with numbness and tingling in the left arm, nausea, and vomiting, and shortness of breath only when vomiting.  Patient states that she is worried about the fluid in her chest around her heart.  She denies fever, chills, constipation, diarrhea, melena, hematochezia, hematemesis, abdominal pain, leg swelling, pleuritic chest pain, PND, orthopnea, headache, or dizziness.  Patient is currently followed by Dr. Earlie Server and Dr. Doylene Canard for oncology and cardiology respectively.  Patient was recently seen and admitted on 6/4 for similar complaints.  Patient CT angiogram at that time was negative for a PE, but the patient was found to have a moderate pericardial effusion at that time.  She was admitted overnight and was found to be stable by Dr. Doylene Canard.  She was sent home the next morning.       Patient is a 63 y.o. female presenting with chest pain. The history is provided by the patient. No language interpreter was used.  Chest Pain Associated symptoms: back pain, fatigue, nausea, numbness, shortness of breath and vomiting   Associated symptoms: no abdominal pain, no cough, no dizziness, no fever, no headache, no palpitations and no weakness     Past Medical History  Diagnosis Date  . Heart murmur   . 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 Anemia   . History of blood transfusion 1970's    "related to tubal pregnancy"   . Radiation 07/29/13-08/13/13    right  partracheal lymph nodes  . lung ca dx'd 06/2009    chemo comp 07/2010; xrt comp 06/2010   Past Surgical History  Procedure Laterality Date  . Lung removal, partial Left 10/17/09    pt states they removed small piece of lung due to cancer  . 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 Insertion central venous access device w/ subcutaneous port Right 05/2013    "PowerPort"  . Video bronchoscopy Bilateral 07/23/2013    Procedure: VIDEO BRONCHOSCOPY WITHOUT FLUORO;  Surgeon: Doree Fudge, MD;  Location: [place];  Service: Cardiopulmonary;  Laterality: Bilateral;   Family History  Problem Relation Age of Onset  . Hypertension Father   . Cancer Sister     breast   History  Substance Use Topics  . Smoking status: Former Smoker -- 0.50 packs/day for 12 years    Types: Cigarettes    Quit date: 04/29/1982  . Smokeless tobacco: Never Used     Comment: quit smoking cigarettes in the 1970's  . Alcohol Use: No   OB History   Grav Para Term Preterm Abortions TAB SAB Ect Mult Living   3 2        2      Review of Systems  Constitutional: Positive for fatigue. Negative for fever, chills, activity change and appetite change.  Respiratory: Positive for shortness of breath. Negative for cough, choking, chest tightness and wheezing.  Cardiovascular: Positive for chest pain. Negative for palpitations and leg swelling.  Gastrointestinal: Positive for nausea and vomiting. Negative for abdominal pain, diarrhea, constipation, blood in stool and rectal pain.  Musculoskeletal: Positive for back pain.  Neurological: Positive for numbness. Negative for dizziness, tremors, weakness, light-headedness and headaches.  All other systems reviewed and are negative.     Allergies  Codeine; Motrin; Other; Trazodone and nefazodone; Banana; Tape; and Xgeva  Home Medications   Prior to Admission medications   Medication Sig Start Date End Date Taking? Authorizing Provider  albuterol (PROAIR  HFA) 108 (90 BASE) MCG/ACT inhaler Inhale 2 puffs into the lungs every 6 (six) hours as needed for wheezing or shortness of breath. 08/11/13  Yes Collene Gobble, MD  Ascorbic Acid (VITAMIN C) 1000 MG tablet Take 1,000 mg by mouth daily.   Yes Historical Provider, MD  calcium carbonate (OS-CAL) 600 MG TABS Take 600 mg by mouth 2 (two) times daily with a meal.   Yes Historical Provider, MD  Cholecalciferol (VITAMIN D) 2000 UNITS tablet Take 2,000 Units by mouth daily.   Yes Historical Provider, MD  colchicine 0.6 MG tablet Take 1 tablet (0.6 mg total) by mouth daily. 10/04/13  Yes [name], MD  dronabinol (MARINOL) 2.5 MG capsule Take 1 capsule (2.5 mg total) by mouth 2 (two) times daily before a meal. 09/21/13  Yes Curt Bears, MD  esomeprazole (NEXIUM) 40 MG capsule Take 40 mg by mouth daily at 12 noon.   Yes Historical Provider, MD  ferrous sulfate 325 (65 FE) MG tablet Take 1 tablet (325 mg total) by mouth 2 (two) times daily with a meal. 07/26/13  Yes [name], MD  HYDROmorphone (DILAUDID) 2 MG tablet Take 0.5 tablets (1 mg total) by mouth every 6 (six) hours as needed for severe pain. 10/04/13  Yes [name], MD  metoCLOPramide (REGLAN) 5 MG/5ML solution Take 5 mLs (5 mg total) by mouth 2 (two) times daily before a meal. 10/04/13  Yes [name], MD  Multiple Vitamin (MULITIVITAMIN WITH MINERALS) TABS Take 1 tablet by mouth daily.    Yes Historical Provider, MD  NIVOLUMAB IV nivolumab (BMS [id number]) 172 mg in sodium chloride 0.9 % 100 mL chemo infusion 3 mg/kg  57.2 kg (Order-Specific) Once 09/21/2013   Route: Intravenous   Yes Historical Provider, MD  potassium chloride (K-DUR,KLOR-CON) 10 MEQ tablet Take 1 tablet (10 mEq total) by mouth 2 (two) times daily. 07/26/13  Yes [name], MD  temazepam (RESTORIL) 15 MG capsule Take 15 mg by mouth at bedtime as needed for sleep.   Yes Historical Provider, MD  vitamin B-12 (CYANOCOBALAMIN) 1000 MCG tablet Take 1,000 mcg by mouth  daily.   Yes Historical Provider, MD  vitamin E 400 UNIT capsule Take 400 Units by mouth daily.   Yes Historical Provider, MD  Zoledronic Acid (ZOMETA) 4 MG/100ML IVPB Inject 4 mg into the vein every 28 (twenty-eight) days.   Yes Historical Provider, MD  metoprolol tartrate (LOPRESSOR) 25 MG tablet Take 0.5 tablets (12.5 mg total) by mouth 2 (two) times daily. 10/04/13   [name], MD   BP 120/52  Pulse 100  Temp(Src) 98.2 F (36.8 C) (Oral)  Resp 23  SpO2 95% Physical Exam  Nursing note and vitals reviewed. Constitutional: She is oriented to person, place, and time. She appears well-developed. She appears cachectic. She is cooperative. She has a sickly appearance.  HENT:  Head: Normocephalic and atraumatic.  Mouth/Throat:  Oropharynx is clear and moist. No oropharyngeal exudate.  Eyes: Conjunctivae and EOM are normal. Pupils are equal, round, and reactive to light. No scleral icterus.  Neck: Normal range of motion. Neck supple. No thyromegaly present.  Cardiovascular: Regular rhythm, normal heart sounds, intact distal pulses and normal pulses.  Tachycardia present.  Exam reveals no distant heart sounds and no friction rub.   Pulmonary/Chest: Effort normal and breath sounds normal. No respiratory distress. She has no wheezes. She has no rales. She exhibits no tenderness.  Well healed surgical scars found on the right chest over port-a-cath, and the posterior left torso from previous vats.  Abdominal: Soft. Bowel sounds are normal. She exhibits no distension and no mass. There is tenderness in the epigastric area. There is no rigidity, no rebound, no guarding, no tenderness at McBurney's point and negative Murphy's sign.  Musculoskeletal: Normal range of motion.  Lymphadenopathy:    She has no cervical adenopathy.  Neurological: She is alert and oriented to person, place, and time. No cranial nerve deficit. Coordination normal.  Skin: Skin is warm and dry. No rash noted. No erythema. No  pallor.  Psychiatric: She has a normal mood and affect. Her behavior is normal. Judgment and thought content normal.    ED Course  Procedures Labs Review Labs Reviewed  CBC WITH DIFFERENTIAL - Abnormal; Notable for the following:    RBC 3.68 (*)    Hemoglobin 9.5 (*)    HCT 29.3 (*)    MCH 25.8 (*)    RDW 15.7 (*)    All other components within normal limits  COMPREHENSIVE METABOLIC PANEL - Abnormal; Notable for the following:    Sodium 132 (*)    Chloride 91 (*)    CO2 16 (*)    Albumin 3.4 (*)    AST 42 (*)    Alkaline Phosphatase 535 (*)    GFR calc non Af Amer 88 (*)    All other components within normal limits  URINALYSIS, ROUTINE W REFLEX MICROSCOPIC  BASIC METABOLIC PANEL  I-STAT TROPOININ, ED    Imaging Review Dg Chest 2 View  10/15/2013   CLINICAL DATA:  Chest pain with numbness down left arm and left leg; history of COPD and lung surgery in 2011 for malignancy.  EXAM: CHEST  2 VIEW  COMPARISON:  PA and lateral chest of July 21, 2013 and chest CT scan of September 30, 2013.  FINDINGS: The lungs remain hyperinflated. Chronic changes in the left perihilar region posteriorly are present. There is stable blunting of the left lateral posterior costophrenic angles. There is no pneumothorax. The bony thorax is unremarkable. There is sclerosis within the left humeral head just inferior to the greater tuberosity which may be new. Power port appliance is unchanged in position. The heart is normal in size. The pulmonary vascularity is not engorged.  IMPRESSION: There are chronic changes of COPD and previous surgery for malignancy in the left lung. There is no acute cardiopulmonary abnormality. There is a focus of sclerosis in the left humeral head that has not been clearly included in the the field-of-view on previous studies. Correlation with any symptoms here is needed. Nuclear bone scanning may be helpful in excluding metastatic disease.   Electronically Signed   By: David  Martinique   On:  10/15/2013 16:30     EKG Interpretation None      Date: 10/15/2013  Rate: 120  Rhythm: sinus tachycardia  QRS Axis: right  Intervals: normal  ST/T Wave abnormalities: nonspecific  T wave changes  Conduction Disutrbances:none  Narrative Interpretation:   Old EKG Reviewed: unchanged   MDM   Final diagnoses:  Chest pain at rest  Non-small cell lung cancer, left   Patient presents to the ED with recurrent chest pain.  Patient has a history of non-small cell metastatic lung cancer and has known pericardial effusion and recent negative CT angiogram.  Troponin, CXR, CBC, and BMP are unremarkable here in the ED.  Cannot rule out PE at this time; however, the patient is not a candidate for heparin therapy due to recent bleed 3 months ago.  I have consulted Dr. Doylene Canard who came to see the patient.  He feels that she should be readmitted at this time.  Dr. Jeanell Sparrow has seen this patient alongside me and agrees with the above workup and current plan.      Kenard Gower, PA-C 10/15/13 1846

## 2013-10-15 NOTE — ED Notes (Signed)
Ambulated pt with restroom..pt was unable to void. PA aware. Per PA, I/O if unable to void

## 2013-10-15 NOTE — ED Notes (Signed)
Explained to pt we needed a Urine sample. She states she doesn't have to go at this time but will notify when she does.

## 2013-10-15 NOTE — Progress Notes (Signed)
Received call from Santiago Glad, Art therapist that patient needed throw up basin in the waiting room.  Went to waiting room and gave Ms. Sassano an emesis basin.  Ms. Eulas Post began dry heaving and then had dark, liquid emesis.  She was brought to an exam room in a wheelchair.  Vitals were taken and a heart rate of 131 was noted.  Patient reports pain in her left chest/abdomen that is also radiating down her left arm.  Patient stated "that she has fluid around her heart."  Transported patient to the ED.

## 2013-10-15 NOTE — ED Notes (Signed)
Patient transported to X-ray 

## 2013-10-15 NOTE — Progress Notes (Signed)
Called ED to get report  On pt.RN was busy and will call  Back.

## 2013-10-15 NOTE — Progress Notes (Signed)
Discussed admission status with Dr. Doylene Canard.

## 2013-10-15 NOTE — ED Notes (Signed)
Patient from cancer center started having chest pain, and vomited x 1 while there getting radiation treatments to her left hip.  Patient reports she has lung cancer and has a history of fluid building up around her heart.

## 2013-10-15 NOTE — ED Provider Notes (Signed)
History/physical exam/procedure(s) were performed by non-physician practitioner and as supervising physician I was immediately available for consultation/collaboration. I have reviewed all notes and am in agreement with care and plan.   Shaune Pollack, MD 10/15/13 [phone]

## 2013-10-15 NOTE — H&P (Signed)
Referring Physician: Dixie Dials, MD  Kaitlin Reed is an 63 y.o. female.                       Chief Complaint: Chest pain HPI: 63 y.o. 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 She is here with chest pain. No history of CAD or cardiac stents. Echocardiogram 2 weeks ago showed mild to moderate pericardial effusion, free flowing without tamponade.    Past Medical History  Diagnosis Date  . Heart murmur   . 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 Anemia   . History of blood transfusion 1970's    "related to tubal pregnancy"   . Radiation 07/29/13-08/13/13    right partracheal lymph nodes  . lung ca dx'd 06/2009    chemo comp 07/2010; xrt comp 06/2010      Past Surgical History  Procedure Laterality Date  . Lung removal, partial Left 10/17/09    pt states they removed small piece of lung due to cancer  . 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 Insertion central venous access device w/ subcutaneous port Right 05/2013    "PowerPort"  . Video bronchoscopy Bilateral 07/23/2013    Procedure: VIDEO BRONCHOSCOPY WITHOUT FLUORO;  Surgeon: Doree Fudge, MD;  Location: [place];  Service: Cardiopulmonary;  Laterality: Bilateral;    Family History  Problem Relation Age of Onset  . Hypertension Father   . Cancer Sister     breast   Social History:  reports that she quit smoking about 31 years ago. Her smoking use included Cigarettes. She has a 6 pack-year smoking history. She has never used smokeless tobacco. She reports that she does not drink alcohol or use illicit drugs.  Allergies:  Allergies  Allergen Reactions  . Codeine Nausea And Vomiting  . Motrin [Ibuprofen] Nausea And Vomiting  . Other Nausea Only    steroids  . Trazodone And Nefazodone Nausea And Vomiting  . Banana Hives  . Tape Swelling    Paper tape okay Cannot use elastic tape  . Xgeva [Denosumab] Rash     (Not in a hospital admission)  Results  for orders placed during the hospital encounter of 10/15/13 (from the past 48 hour(s))  CBC WITH DIFFERENTIAL     Status: Abnormal   Collection Time    10/15/13  3:52 PM      Result Value Ref Range   WBC 7.9  4.0 - 10.5 K/uL   RBC 3.68 (*) 3.87 - 5.11 MIL/uL   Hemoglobin 9.5 (*) 12.0 - 15.0 g/dL   HCT 29.3 (*) 36.0 - 46.0 %   MCV 79.6  78.0 - 100.0 fL   MCH 25.8 (*) 26.0 - 34.0 pg   MCHC 32.4  30.0 - 36.0 g/dL   RDW 15.7 (*) 11.5 - 15.5 %   Platelets 379  150 - 400 K/uL   Neutrophils Relative % 77  43 - 77 %   Neutro Abs 6.1  1.7 - 7.7 K/uL   Lymphocytes Relative 14  12 - 46 %   Lymphs Abs 1.1  0.7 - 4.0 K/uL   Monocytes Relative 9  3 - 12 %   Monocytes Absolute 0.7  0.1 - 1.0 K/uL   Eosinophils Relative 0  0 - 5 %   Eosinophils Absolute 0.0  0.0 - 0.7 K/uL   Basophils Relative 0  0 - 1 %   Basophils  Absolute 0.0  0.0 - 0.1 K/uL  COMPREHENSIVE METABOLIC PANEL     Status: Abnormal   Collection Time    10/15/13  3:52 PM      Result Value Ref Range   Sodium 132 (*) 137 - 147 mEq/L   Comment: REPEATED TO VERIFY   Potassium 3.9  3.7 - 5.3 mEq/L   Chloride 91 (*) 96 - 112 mEq/L   Comment: REPEATED TO VERIFY   CO2 16 (*) 19 - 32 mEq/L   Comment: REPEATED TO VERIFY   Glucose, Bld 95  70 - 99 mg/dL   BUN 15  6 - 23 mg/dL   Creatinine, Ser 0.73  0.50 - 1.10 mg/dL   Calcium 9.4  8.4 - 10.5 mg/dL   Total Protein 7.6  6.0 - 8.3 g/dL   Albumin 3.4 (*) 3.5 - 5.2 g/dL   AST 42 (*) 0 - 37 U/L   ALT 10  0 - 35 U/L   Alkaline Phosphatase 535 (*) 39 - 117 U/L   Total Bilirubin 0.3  0.3 - 1.2 mg/dL   GFR calc non Af Amer 88 (*) >90 mL/min   GFR calc Af Amer >90  >90 mL/min   Comment: (NOTE)     The eGFR has been calculated using the CKD EPI equation.     This calculation has not been validated in all clinical situations.     eGFR's persistently <90 mL/min signify possible Chronic Kidney     Disease.  Randolm Idol, ED     Status: None   Collection Time    10/15/13  4:06 PM       Result Value Ref Range   Troponin i, poc 0.00  0.00 - 0.08 ng/mL   Comment 3            Comment: Due to the release kinetics of cTnI,     a negative result within the first hours     of the onset of symptoms does not rule out     myocardial infarction with certainty.     If myocardial infarction is still suspected,     repeat the test at appropriate intervals.   Dg Chest 2 View  10/15/2013   CLINICAL DATA:  Chest pain with numbness down left arm and left leg; history of COPD and lung surgery in 2011 for malignancy.  EXAM: CHEST  2 VIEW  COMPARISON:  PA and lateral chest of July 21, 2013 and chest CT scan of September 30, 2013.  FINDINGS: The lungs remain hyperinflated. Chronic changes in the left perihilar region posteriorly are present. There is stable blunting of the left lateral posterior costophrenic angles. There is no pneumothorax. The bony thorax is unremarkable. There is sclerosis within the left humeral head just inferior to the greater tuberosity which may be new. Power port appliance is unchanged in position. The heart is normal in size. The pulmonary vascularity is not engorged.  IMPRESSION: There are chronic changes of COPD and previous surgery for malignancy in the left lung. There is no acute cardiopulmonary abnormality. There is a focus of sclerosis in the left humeral head that has not been clearly included in the the field-of-view on previous studies. Correlation with any symptoms here is needed. Nuclear bone scanning may be helpful in excluding metastatic disease.   Electronically Signed   By: David  Martinique   On: 10/15/2013 16:30    Review Of Systems  Constitutional: positive for fatigue and weight loss  Eyes: negative  Ears, nose, mouth, throat, and face: negative  Respiratory: positive for cough and hemoptysis  Cardiovascular: negative  Gastrointestinal: positive for epigastric discomfort  Genitourinary: negative  Integument/breast: negative  Hematologic/lymphatic: negative   Musculoskeletal:positive for arthralgias and myalgias  Neurological: negative  Behavioral/Psych: negative  Endocrine: negative  Allergic/Immunologic: negative    Blood pressure 120/52, pulse 120, temperature 97.7 F (36.5 C), temperature source Oral, resp. rate 30, SpO2 98.00%.  PHYSICAL EXAMINATION: General appearance: alert, cooperative but appears anxious.  Head: Normocephalic, atraumatic. [employer], PERL, EOMI. Multiple dark brown freckles over face. Conj-pale pink  Neck: no adenopathy, full range of motion.  Resp: clear to auscultation bilaterally. Right anterior chest Port-A-Cath, well-healed, no evidence of infection or inflammation, non-tender.  Cardio: Tachycardic, S1, S2 normal, no murmur or click. No pericardial rub today.  GI: soft, epigastric and left upper quadrant-tender; bowel sounds normal; no masses, no organomegaly.  Extremities: extremities normal, atraumatic, no cyanosis or edema  Neurologic: Alert and oriented X 3, mild generalized weakness. Normal symmetric reflexes. Normal coordination and gait  Skin-mild paleness.  Assessment/Plan Chest pain  Chronic Pericarditis with effusion  Anemia of chronic disease  Non-small cell lung cancer with metastasis Anxiety  Medical/palliative treatment as much as possible. Full code per patient request. Recheck pericardial fluid with limited echocardiogram.  KADAKIA,AJAY S 10/15/2013, 6:28 PM

## 2013-10-16 LAB — BASIC METABOLIC PANEL
BUN: 11 mg/dL (ref 6–23)
CALCIUM: 9 mg/dL (ref 8.4–10.5)
CO2: 21 mEq/L (ref 19–32)
Chloride: 97 mEq/L (ref 96–112)
Creatinine, Ser: 0.72 mg/dL (ref 0.50–1.10)
GFR calc non Af Amer: 88 mL/min — ABNORMAL LOW (ref >90)
GLUCOSE: 81 mg/dL (ref 70–99)
Potassium: 4.4 mEq/L (ref 3.7–5.3)
SODIUM: 136 meq/L — AB (ref 137–147)

## 2013-10-16 MED ORDER — CALCIUM CARBONATE 1250 (500 CA) MG PO TABS
1.0000 | ORAL_TABLET | Freq: Two times a day (BID) | ORAL | Status: DC
  Administered 2013-10-16 – 2013-10-17 (×3): 500 mg via ORAL
  Filled 2013-10-16 (×5): qty 1

## 2013-10-17 MED ORDER — HEPARIN SOD (PORK) LOCK FLUSH 100 UNIT/ML IV SOLN
500.0000 [IU] | INTRAVENOUS | Status: AC | PRN
  Administered 2013-10-17: 500 [IU]
  Filled 2013-10-17: qty 5

## 2013-10-17 NOTE — Discharge Summary (Signed)
Physician Discharge Summary  Patient ID: Kaitlin Reed MRN: [id number] DOB/AGE: [birth year] 62 y.o.  Admit date: 10/15/2013 Discharge date: 10/17/2013  Admission Diagnoses: Chest pain  Chronic Pericarditis with effusion  Anemia of chronic disease  Non-small cell lung cancer with metastasis  Anxiety  Discharge Diagnoses:  Principle Problem: * Chest pain at rest *  Chronic Pericarditis with effusion  Anemia of chronic disease  Non-small cell lung cancer with metastasis  Anxiety   Discharged Condition: fair  Hospital Course: 63 y.o. 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 She is here with chest pain. No history of CAD or cardiac stents. Echocardiogram 2 weeks ago showed mild to moderate pericardial effusion, free flowing without tamponade. Quick look echocardiogram showed stable pericardial effusion without tamponade. She was encouraged to walk with a walker and discharged home with follow up by me in 2 weeks.   Consults: cardiology  Significant Diagnostic Studies: labs: Low Hgb of 9.5 and sodium of 132 meq and chloride of 91 improved with increased salt in regular diet intake. Elevated alkaline phosphatase.  Treatments: analgesia: Dilaudid  Discharge Exam: Blood pressure 94/45, pulse 94, temperature 97.3 F (36.3 C), temperature source Oral, resp. rate 16, height 5\' 6"  (1.676 m), weight 54 kg (119 lb 0.8 oz), SpO2 99.00%.  PHYSICAL EXAMINATION: General appearance: alert, cooperative but appears anxious.  Head: Normocephalic, atraumatic. [employer], PERL, EOMI. Multiple dark brown freckles over face. Conj-pale pink  Neck: no adenopathy, full range of motion.  Resp: clear to auscultation bilaterally. Right anterior chest Port-A-Cath, well-healed, no evidence of infection or inflammation, non-tender.  Cardio: Tachycardic, S1, S2 normal, no murmur or click. No pericardial rub today.  GI: soft, epigastric and left upper  quadrant-tender; bowel sounds normal; no masses, no organomegaly.  Extremities: extremities normal, atraumatic, no cyanosis or edema  Neurologic: Alert and oriented X 3, mild generalized weakness. Normal symmetric reflexes. Normal coordination and gait  Skin-mild paleness  Disposition: 01-Home or Self Care     Medication List         albuterol 108 (90 BASE) MCG/ACT inhaler  Commonly known as:  PROAIR HFA  Inhale 2 puffs into the lungs every 6 (six) hours as needed for wheezing or shortness of breath.     calcium carbonate 600 MG Tabs tablet  Commonly known as:  OS-CAL  Take 600 mg by mouth 2 (two) times daily with a meal.     colchicine 0.6 MG tablet  Take 1 tablet (0.6 mg total) by mouth daily.     dronabinol 2.5 MG capsule  Commonly known as:  MARINOL  Take 1 capsule (2.5 mg total) by mouth 2 (two) times daily before a meal.     esomeprazole 40 MG capsule  Commonly known as:  NEXIUM  Take 40 mg by mouth daily at 12 noon.     ferrous sulfate 325 (65 FE) MG tablet  Take 1 tablet (325 mg total) by mouth 2 (two) times daily with a meal.     HYDROmorphone 2 MG tablet  Commonly known as:  DILAUDID  Take 0.5 tablets (1 mg total) by mouth every 6 (six) hours as needed for severe pain.     metoCLOPramide 5 MG/5ML solution  Commonly known as:  REGLAN  Take 5 mLs (5 mg total) by mouth 2 (two) times daily before a meal.     metoprolol tartrate 25 MG tablet  Commonly known as:  LOPRESSOR  Take 0.5 tablets (  12.5 mg total) by mouth 2 (two) times daily.     multivitamin with minerals Tabs tablet  Take 1 tablet by mouth daily.     NIVOLUMAB IV  - nivolumab (BMS [id number]) 172 mg in sodium chloride 0.9 % 100 mL chemo infusion 3 mg/kg  57.2 kg (Order-Specific) Once 09/21/2013  -   Route: Intravenous     potassium chloride 10 MEQ tablet  Commonly known as:  K-DUR,KLOR-CON  Take 1 tablet (10 mEq total) by mouth 2 (two) times daily.     temazepam 15 MG capsule  Commonly known  as:  RESTORIL  Take 15 mg by mouth at bedtime as needed for sleep.     vitamin B-12 1000 MCG tablet  Commonly known as:  CYANOCOBALAMIN  Take 1,000 mcg by mouth daily.     vitamin C 1000 MG tablet  Take 1,000 mg by mouth daily.     Vitamin D 2000 UNITS tablet  Take 2,000 Units by mouth daily.     vitamin E 400 UNIT capsule  Take 400 Units by mouth daily.     ZOMETA 4 MG/100ML IVPB  Generic drug:  Zoledronic Acid  Inject 4 mg into the vein every 28 (twenty-eight) days.         SignedDixie Dials S 10/17/2013, 8:11 AM

## 2013-10-17 NOTE — Progress Notes (Signed)
Patient discharged to home via wheelchair with family, discharge instructions reviewed with patient who verbalized understanding. No new RX's for home.

## 2013-10-17 NOTE — Progress Notes (Signed)
Late entry  Ref: [name], MD   Subjective:  Feeling better. Improved sodium and chloride level. Afebrile.  Objective:  Vital Signs in the last 24 hours: Temp:  [97.3 F (36.3 C)-98.5 F (36.9 C)] 97.3 F (36.3 C) (06/21 0530) Pulse Rate:  [90-100] 94 (06/21 0530) Cardiac Rhythm:  [-]  Resp:  [16] 16 (06/21 0530) BP: (94-101)/(45) 94/45 mmHg (06/21 0530) SpO2:  [98 %-99 %] 99 % (06/21 0530) Weight:  [54 kg (119 lb 0.8 oz)] 54 kg (119 lb 0.8 oz) (06/21 0557)  Physical Exam: BP Readings from Last 1 Encounters:  10/16/13 94/45    Wt Readings from Last 1 Encounters:  10/16/13 54 kg (119 lb 0.8 oz)    Weight change:    Physical Exam: HEENT: [place]/AT, Eyes-Brown, PERL, EOMI, Conjunctiva-Pale, Sclera-Non-icteric. Multiple freckles on face. Neck: No JVD, No bruit, Trachea midline. Lungs:  Clear, Bilateral. Cardiac:  Regular rhythm, normal S1 and S2, no S3.  Abdomen:  Soft, non-tender. Extremities:  No edema present. No cyanosis. No clubbing. CNS: AxOx3, Cranial nerves grossly intact, moves all 4 extremities. Right handed. Skin: Warm and dry.   Intake/Output from previous day: 06/20 0701 - 06/21 0700 In: 929.5 [P.O.:600; I.V.:329.5] Out: 300 [Urine:300]    Lab Results: BMET    Component Value Date/Time   NA 136* 10/16/2013 0513   NA 132* 10/15/2013 1552   NA 137 10/05/2013 1443   NA 134* 10/01/2013 0605   NA 138 09/21/2013 1454   NA 138 09/07/2013 1511   NA 138 11/06/2011 1121   NA 146* 03/04/2011 0806   NA 146* 11/29/2010 0803   K 4.4 10/16/2013 0513   K 3.9 10/15/2013 1552   K 3.8 10/05/2013 1443   K 4.4 10/01/2013 0605   K 4.3 09/21/2013 1454   K 3.8 09/07/2013 1511   K 4.3 11/06/2011 1121   K 3.8 03/04/2011 0806   K 4.1 11/29/2010 0803   CL 97 10/16/2013 0513   CL 91* 10/15/2013 1552   CL 96 10/01/2013 0605   CL 105 10/07/2012 1350   CL 104 08/24/2012 1547   CL 103 08/17/2012 1544   CL 98 11/06/2011 1121   CL 102 03/04/2011 0806   CL 106 11/29/2010 0803   CO2 21 10/16/2013 0513    CO2 16* 10/15/2013 1552   CO2 25 10/05/2013 1443   CO2 24 10/01/2013 0605   CO2 20* 09/21/2013 1454   CO2 23 09/07/2013 1511   CO2 30 11/06/2011 1121   CO2 32 03/04/2011 0806   CO2 30 11/29/2010 0803   GLUCOSE 81 10/16/2013 0513   GLUCOSE 95 10/15/2013 1552   GLUCOSE 122 10/05/2013 1443   GLUCOSE 85 10/01/2013 0605   GLUCOSE 111 09/21/2013 1454   GLUCOSE 111 09/07/2013 1511   GLUCOSE 123* 10/07/2012 1350   GLUCOSE 105* 08/24/2012 1547   GLUCOSE 105* 08/17/2012 1544   GLUCOSE 108 11/06/2011 1121   GLUCOSE 99 03/04/2011 0806   GLUCOSE 105 11/29/2010 0803   BUN 11 10/16/2013 0513   BUN 15 10/15/2013 1552   BUN 9.8 10/05/2013 1443   BUN 11 10/01/2013 0605   BUN 21.5 09/21/2013 1454   BUN 17.1 09/07/2013 1511   BUN 15 11/06/2011 1121   BUN 12 03/04/2011 0806   BUN 16 11/29/2010 0803   CREATININE 0.72 10/16/2013 0513   CREATININE 0.73 10/15/2013 1552   CREATININE 1.1 10/05/2013 1443   CREATININE 0.77 10/01/2013 0605   CREATININE 1.1 09/21/2013 1454   CREATININE  1.0 09/07/2013 1511   CREATININE 0.9 11/06/2011 1121   CREATININE 0.9 03/04/2011 0806   CREATININE 1.1 11/29/2010 0803   CALCIUM 9.0 10/16/2013 0513   CALCIUM 9.4 10/15/2013 1552   CALCIUM 9.0 10/05/2013 1443   CALCIUM 8.7 10/01/2013 0605   CALCIUM 10.0 09/21/2013 1454   CALCIUM 10.1 09/07/2013 1511   CALCIUM 9.0 11/06/2011 1121   CALCIUM 9.0 03/04/2011 0806   CALCIUM 9.3 11/29/2010 0803   GFRNONAA 88* 10/16/2013 0513   GFRNONAA 88* 10/15/2013 1552   GFRNONAA 86* 10/01/2013 0605   GFRAA >90 10/16/2013 0513   GFRAA >90 10/15/2013 1552   GFRAA >90 10/01/2013 0605   CBC    Component Value Date/Time   WBC 7.9 10/15/2013 1552   WBC 7.2 10/05/2013 1442   RBC 3.68* 10/15/2013 1552   RBC 3.68* 10/05/2013 1442   HGB 9.5* 10/15/2013 1552   HGB 9.7* 10/05/2013 1442   HCT 29.3* 10/15/2013 1552   HCT 30.1* 10/05/2013 1442   PLT 379 10/15/2013 1552   PLT 362 10/05/2013 1442   MCV 79.6 10/15/2013 1552   MCV 81.9 10/05/2013 1442   MCH 25.8* 10/15/2013 1552   MCH 26.3 10/05/2013 1442   MCHC 32.4  10/15/2013 1552   MCHC 32.1 10/05/2013 1442   RDW 15.7* 10/15/2013 1552   RDW 17.0* 10/05/2013 1442   LYMPHSABS 1.1 10/15/2013 1552   LYMPHSABS 1.0 10/05/2013 1442   MONOABS 0.7 10/15/2013 1552   MONOABS 0.8 10/05/2013 1442   EOSABS 0.0 10/15/2013 1552   EOSABS 0.1 10/05/2013 1442   BASOSABS 0.0 10/15/2013 1552   BASOSABS 0.0 10/05/2013 1442   HEPATIC Function Panel  Recent Labs  09/30/13 0804 10/05/13 1443 10/15/13 1552  PROT 7.7 7.4 7.6   HEMOGLOBIN A1C No components found with this basename: HGA1C,  MPG   CARDIAC ENZYMES Lab Results  Component Value Date   CKTOTAL 164 04/17/2011   CKMB 2.3 04/17/2011   TROPONINI <0.30 01/21/2012   TROPONINI <0.30 01/21/2012   TROPONINI <0.30 01/20/2012   BNP  Recent Labs  09/30/13 0804  PROBNP 255.5*   TSH  Recent Labs  06/03/13 1513 07/20/13 1354 09/21/13 1454  TSH 2.529 4.730* 2.919   CHOLESTEROL No results found for this basename: CHOL,  in the last 8760 hours  Scheduled Meds: . calcium carbonate  1 tablet Oral BID WC  . cholecalciferol  2,000 Units Oral Daily  . colchicine  0.6 mg Oral Daily  . docusate sodium  100 mg Oral BID  . dronabinol  2.5 mg Oral BID AC  . ferrous sulfate  325 mg Oral BID WC  . metoCLOPramide  5 mg Oral BID AC  . metoprolol tartrate  12.5 mg Oral BID  . multivitamin with minerals  1 tablet Oral Daily  . pantoprazole  40 mg Oral Daily  . potassium chloride  10 mEq Oral BID  . sodium chloride  3 mL Intravenous Q12H  . vitamin B-12  1,000 mcg Oral Daily  . vitamin C  1,000 mg Oral Daily  . vitamin E  400 Units Oral Daily   Continuous Infusions:  PRN Meds:.sodium chloride, albuterol, HYDROmorphone, ondansetron (ZOFRAN) IV, ondansetron, sodium chloride, temazepam  Assessment/Plan: Chest pain  Chronic Pericarditis with effusion  Anemia of chronic disease  Non-small cell lung cancer with metastasis  Anxiety  Limited echocardiogram   LOS: 1 days    Kaitlin Dials  MD  10/17/2013, 8:18 AM

## 2013-10-17 NOTE — Progress Notes (Signed)
Echocardiogram 2D Echocardiogram has been performed.  Kaitlin Reed 10/17/2013, 8:43 AM

## 2013-10-18 ENCOUNTER — Ambulatory Visit
Admission: RE | Admit: 2013-10-18 | Discharge: 2013-10-18 | Disposition: A | Discharge status: Home / Self Care | Payer: Medicare Other | Source: Ambulatory Visit | Attending: Radiation Oncology | Admitting: Radiation Oncology

## 2013-10-19 ENCOUNTER — Telehealth: Payer: Self-pay | Admitting: *Deleted

## 2013-10-19 ENCOUNTER — Ambulatory Visit
Admission: RE | Admit: 2013-10-19 | Discharge: 2013-10-19 | Disposition: A | Discharge status: Home / Self Care | Payer: Medicare Other | Source: Ambulatory Visit | Attending: Radiation Oncology | Admitting: Radiation Oncology

## 2013-10-19 ENCOUNTER — Encounter: Payer: Self-pay | Admitting: Radiation Oncology

## 2013-10-19 ENCOUNTER — Ambulatory Visit: Payer: Medicare Other

## 2013-10-19 ENCOUNTER — Other Ambulatory Visit: Payer: Medicare Other

## 2013-10-19 ENCOUNTER — Ambulatory Visit: Payer: Medicare Other | Admitting: Internal Medicine

## 2013-10-19 VITALS — BP 104/63 | HR 107 | Temp 98.2°F | Resp 20 | Wt 116.0 lb

## 2013-10-19 DIAGNOSIS — C7951 Secondary malignant neoplasm of bone: Secondary | ICD-10-CM | POA: Insufficient documentation

## 2013-10-19 DIAGNOSIS — C7952 Secondary malignant neoplasm of bone marrow: Principal | ICD-10-CM

## 2013-10-19 NOTE — Telephone Encounter (Signed)
pts son curtis called wanting an update on pt status.  No one is one pts release of info form for [facility]. Called and informed him, he verbalized understanding.

## 2013-10-19 NOTE — Telephone Encounter (Signed)
Pt cancelled lab/ MD and chemo appts today.  Put new lab and f/u appt on 6/29.  Called pt to let her know about appts, she was asleep.  Pt's granddaughter will let her know we called and to call back to discuss appts.  SLJ

## 2013-10-19 NOTE — Progress Notes (Signed)
Weekly Management Note Current Dose:9 Gy  Projected Dose:30 Gy   Narrative:  The patient presents for routine under treatment assessment.  CBCT/MVCT images/Port film x-rays were reviewed.  The chart was checked. Echo showed stability of effusion with no tamponade. Sent home. Here in pain and feeling weak today. Does not want to come into the hospital. Too weak and in too much pain for med onc visit this afternoon and requested I cancel. Declined any medications in clinic today. Left hip pain continues to be her main issue.   Physical Findings:  Thin. Lying on exam room table. Alert.   Vitals:  Filed Vitals:   10/19/13 1123  BP: 104/63  Pulse: 107  Temp: 98.2 F (36.8 C)  Resp: 20   Weight:  Wt Readings from Last 3 Encounters:  10/19/13 116 lb (52.617 kg)  10/17/13 119 lb 0.8 oz (54 kg)  10/05/13 118 lb 8 oz (53.751 kg)   Lab Results  Component Value Date   WBC 7.9 10/15/2013   HGB 9.5* 10/15/2013   HCT 29.3* 10/15/2013   MCV 79.6 10/15/2013   PLT 379 10/15/2013   Lab Results  Component Value Date   CREATININE 0.72 10/16/2013   BUN 11 10/16/2013   NA 136* 10/16/2013   K 4.4 10/16/2013   CL 97 10/16/2013   CO2 21 10/16/2013     Impression:  The patient is tolerating radiation.  Plan:  Continue treatment as planned. Will cancel appt and reschedule per pt request encouraged her to call with questions.

## 2013-10-20 ENCOUNTER — Ambulatory Visit
Admission: RE | Admit: 2013-10-20 | Discharge: 2013-10-20 | Disposition: A | Discharge status: Home / Self Care | Payer: Medicare Other | Source: Ambulatory Visit | Attending: Radiation Oncology | Admitting: Radiation Oncology

## 2013-10-21 ENCOUNTER — Encounter: Payer: Self-pay | Admitting: *Deleted

## 2013-10-21 ENCOUNTER — Ambulatory Visit
Admission: RE | Admit: 2013-10-21 | Discharge: 2013-10-21 | Disposition: A | Discharge status: Home / Self Care | Payer: Medicare Other | Source: Ambulatory Visit | Attending: Radiation Oncology | Admitting: Radiation Oncology

## 2013-10-21 DIAGNOSIS — C3490 Malignant neoplasm of unspecified part of unspecified bronchus or lung: Secondary | ICD-10-CM

## 2013-10-21 NOTE — Progress Notes (Signed)
10/21/13 Met briefly with Mrs. Wayment after her RT treatment today. She was in a wheelchair and was accompanied by her granddaughters. I reminded her she had an appointment for lab work and to see the PA Monday afternoon. She agreed for me to call her Monday to remind her about the times of the appointments.

## 2013-10-22 ENCOUNTER — Encounter: Payer: Self-pay | Admitting: Radiation Oncology

## 2013-10-22 ENCOUNTER — Ambulatory Visit
Admission: RE | Admit: 2013-10-22 | Discharge: 2013-10-22 | Disposition: A | Discharge status: Home / Self Care | Payer: Medicare Other | Source: Ambulatory Visit | Attending: Radiation Oncology | Admitting: Radiation Oncology

## 2013-10-25 ENCOUNTER — Encounter: Payer: Self-pay | Admitting: *Deleted

## 2013-10-25 ENCOUNTER — Emergency Department (HOSPITAL_COMMUNITY): Payer: Medicare Other

## 2013-10-25 ENCOUNTER — Ambulatory Visit: Payer: Medicare Other | Admitting: Physician Assistant

## 2013-10-25 ENCOUNTER — Other Ambulatory Visit: Payer: Self-pay

## 2013-10-25 ENCOUNTER — Other Ambulatory Visit: Payer: Medicare Other

## 2013-10-25 ENCOUNTER — Observation Stay (HOSPITAL_COMMUNITY)
Admission: EM | Admit: 2013-10-25 | Discharge: 2013-10-27 | Disposition: A | Discharge status: Home / Self Care | Payer: Medicare Other | Attending: Cardiovascular Disease | Admitting: Cardiovascular Disease

## 2013-10-25 ENCOUNTER — Telehealth: Payer: Self-pay | Admitting: *Deleted

## 2013-10-25 ENCOUNTER — Encounter (HOSPITAL_COMMUNITY): Payer: Self-pay | Admitting: Emergency Medicine

## 2013-10-25 ENCOUNTER — Ambulatory Visit: Admission: RE | Admit: 2013-10-25 | Payer: Medicare Other | Source: Ambulatory Visit

## 2013-10-25 ENCOUNTER — Telehealth: Payer: Self-pay

## 2013-10-25 DIAGNOSIS — D649 Anemia, unspecified: Secondary | ICD-10-CM | POA: Insufficient documentation

## 2013-10-25 DIAGNOSIS — I309 Acute pericarditis, unspecified: Secondary | ICD-10-CM | POA: Diagnosis not present

## 2013-10-25 DIAGNOSIS — R011 Cardiac murmur, unspecified: Secondary | ICD-10-CM | POA: Insufficient documentation

## 2013-10-25 DIAGNOSIS — I319 Disease of pericardium, unspecified: Secondary | ICD-10-CM | POA: Diagnosis present

## 2013-10-25 DIAGNOSIS — R079 Chest pain, unspecified: Secondary | ICD-10-CM | POA: Diagnosis present

## 2013-10-25 LAB — CBC
HCT: 29.7 % — ABNORMAL LOW (ref 36.0–46.0)
Hemoglobin: 9.7 g/dL — ABNORMAL LOW (ref 12.0–15.0)
MCH: 25.9 pg — AB (ref 26.0–34.0)
MCHC: 32.7 g/dL (ref 30.0–36.0)
MCV: 79.4 fL (ref 78.0–100.0)
Platelets: 362 10*3/uL (ref 150–400)
RBC: 3.74 MIL/uL — ABNORMAL LOW (ref 3.87–5.11)
RDW: 16.2 % — ABNORMAL HIGH (ref 11.5–15.5)
WBC: 7.5 10*3/uL (ref 4.0–10.5)

## 2013-10-25 LAB — BASIC METABOLIC PANEL
BUN: 15 mg/dL (ref 6–23)
CALCIUM: 8.9 mg/dL (ref 8.4–10.5)
CO2: 20 mEq/L (ref 19–32)
CREATININE: 0.77 mg/dL (ref 0.50–1.10)
Chloride: 94 mEq/L — ABNORMAL LOW (ref 96–112)
GFR calc Af Amer: >90 mL/min (ref >90)
GFR calc non Af Amer: 86 mL/min — ABNORMAL LOW (ref >90)
GLUCOSE: 120 mg/dL — AB (ref 70–99)
Potassium: 4.1 mEq/L (ref 3.7–5.3)
Sodium: 134 mEq/L — ABNORMAL LOW (ref 137–147)

## 2013-10-25 LAB — I-STAT TROPONIN, ED: TROPONIN I, POC: 0 ng/mL (ref 0.00–0.08)

## 2013-10-25 MED ORDER — SODIUM CHLORIDE 0.9 % IJ SOLN: 3.0000 mL | Freq: Two times a day (BID) | INTRAMUSCULAR | Status: DC

## 2013-10-25 MED ORDER — DOCUSATE SODIUM 100 MG PO CAPS
100.0000 mg | ORAL_CAPSULE | Freq: Two times a day (BID) | ORAL | Status: DC
  Administered 2013-10-25 – 2013-10-27 (×3): 100 mg via ORAL
  Filled 2013-10-25 (×5): qty 1

## 2013-10-25 MED ORDER — ONDANSETRON HCL 4 MG/2ML IJ SOLN
4.0000 mg | Freq: Once | INTRAMUSCULAR | Status: AC
  Administered 2013-10-25: 4 mg via INTRAVENOUS
  Filled 2013-10-25: qty 2

## 2013-10-25 MED ORDER — HYDROMORPHONE HCL PF 1 MG/ML IJ SOLN
0.5000 mg | INTRAMUSCULAR | Status: DC | PRN
  Administered 2013-10-25: 0.5 mg via INTRAVENOUS
  Filled 2013-10-25: qty 1

## 2013-10-25 MED ORDER — HYDROMORPHONE HCL PF 1 MG/ML IJ SOLN: 1.0000 mg | Freq: Once | INTRAMUSCULAR | Status: DC

## 2013-10-25 MED ORDER — HYDROMORPHONE HCL PF 1 MG/ML IJ SOLN: 0.5000 mg | Freq: Three times a day (TID) | INTRAMUSCULAR | Status: DC | PRN

## 2013-10-25 MED ORDER — SODIUM CHLORIDE 0.9 % IV SOLN
INTRAVENOUS | Status: DC
  Administered 2013-10-25 – 2013-10-26 (×2): via INTRAVENOUS

## 2013-10-25 MED ORDER — ACETAMINOPHEN 650 MG RE SUPP: 650.0000 mg | Freq: Four times a day (QID) | RECTAL | Status: DC | PRN

## 2013-10-25 MED ORDER — SODIUM CHLORIDE 0.9 % IJ SOLN
10.0000 mL | INTRAMUSCULAR | Status: DC | PRN
  Administered 2013-10-27: 10 mL

## 2013-10-25 MED ORDER — ACETAMINOPHEN 325 MG PO TABS: 650.0000 mg | ORAL_TABLET | Freq: Four times a day (QID) | ORAL | Status: DC | PRN

## 2013-10-25 MED ORDER — ALUM & MAG HYDROXIDE-SIMETH 200-200-20 MG/5ML PO SUSP: 30.0000 mL | Freq: Four times a day (QID) | ORAL | Status: DC | PRN

## 2013-10-25 MED ORDER — PROMETHAZINE HCL 25 MG/ML IJ SOLN
12.5000 mg | Freq: Once | INTRAMUSCULAR | Status: AC
  Administered 2013-10-25: 12.5 mg via INTRAVENOUS
  Filled 2013-10-25: qty 1

## 2013-10-25 MED ORDER — HYDROMORPHONE HCL PF 1 MG/ML IJ SOLN
0.5000 mg | Freq: Once | INTRAMUSCULAR | Status: AC
  Administered 2013-10-25: 0.5 mg via INTRAVENOUS
  Filled 2013-10-25: qty 1

## 2013-10-25 MED ORDER — ONDANSETRON HCL 4 MG PO TABS
4.0000 mg | ORAL_TABLET | Freq: Four times a day (QID) | ORAL | Status: DC | PRN
  Administered 2013-10-26: 4 mg via ORAL
  Filled 2013-10-25: qty 1

## 2013-10-25 MED ORDER — ONDANSETRON HCL 4 MG/2ML IJ SOLN
4.0000 mg | Freq: Four times a day (QID) | INTRAMUSCULAR | Status: DC | PRN
  Administered 2013-10-26: 4 mg via INTRAVENOUS
  Filled 2013-10-25: qty 2

## 2013-10-25 NOTE — ED Notes (Signed)
Pt came from oncology.  Pt had sudden onset chest pain and brought to [place].  Pt is light headed abd feels like she is going to pass out.

## 2013-10-25 NOTE — Progress Notes (Signed)
LATE ENTRY 1410 Patients family member stated patient was complaining of leg pain in the lobby. Went to assess patient, patient complained of chest pain, left arm pain, SOB and left leg pain and nausea and patient vomiting. Patient VS taken HR 133 O2 99% BP 115/77, Dr,Mohammed's nurse and triage nurse were notified by Mindy Toth,LPN, Instructed to take patient to ED. Patient taken to ED by Mindy Toth,LPN. Family members present.

## 2013-10-25 NOTE — ED Notes (Signed)
I did EKG when patient arrived to room EKG would not export because its down.  MD Stevie Kern came and look at it on the monitor.  Will export and print when available.

## 2013-10-25 NOTE — Progress Notes (Unsigned)
10/25/2013 Kaitlin Reed was in the cancer center this afternoon for lab work, RT, and physical exam. After registering and before going to the lab, she began complaining of chest pain, and pain going down her left arm and leg. She then experienced emesis. communication with Dr. Julien Nordmann was for her to go to the emergency room. Taken per wheel chair by staff, accompanied by her son and granddaughter.   10/26/2013 Kaitlin Reed has been admitted to [facility]. Discussed admission with Dr. Julien Nordmann. He does not think this admission is related to study drug.  Also asked him to order required study labs to be drawn while she is an inpatient.

## 2013-10-25 NOTE — ED Notes (Signed)
Pt brought in by [facility]. Per family, pt had a sudden onset of chest pain that radiates down the left arm, back, and jaw while waiting for laboratory work. Pt reports feeling short of breath, dizziness, lightheadedness, nausea, and emesis.

## 2013-10-25 NOTE — Telephone Encounter (Signed)
Kaitlin Reed, called to relay that Ms Kaitlin Reed is being sent to the ED for Chest Pain.  She was seen in the ED on 10/15/13 with c/o chest pain and was released.

## 2013-10-25 NOTE — ED Provider Notes (Signed)
CSN: [id number]     Arrival date & time 10/25/13  1424 History   First MD Initiated Contact with Patient 10/25/13 1500     Chief Complaint  Patient presents with  . Chest Pain     (Consider location/radiation/quality/duration/timing/severity/associated sxs/prior Treatment) HPI Comments: Patient presents to the emergency department with chief complaint of chest pain. She states that she has had the cancer Center today, when she began having sudden onset chest pain that radiated to the left arm and to the back and jaw. She states that she has had these symptoms before, approximately 2 weeks ago, and was diagnosed with pericarditis with effusion.  She was admitted and observed by Dr. Doylene Canard.  Patient states that the symptoms feel exactly the same.   Patient has a history of non-small cell lung cancer.  She is currently receiving palliative chemo therapy.  She states that the pain is 7/10.  It was originally 8/10.  She has not taken anything to alleviate her symptoms.  She states that the pain is worse when she is standing up.  The history is provided by the patient. No language interpreter was used.    Past Medical History  Diagnosis Date  . Heart murmur   . 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 Anemia   . History of blood transfusion 1970's    "related to tubal pregnancy"   . Radiation 07/29/13-08/13/13    right partracheal lymph nodes  . lung ca dx'd 06/2009    chemo comp 07/2010; xrt comp 06/2010   Past Surgical History  Procedure Laterality Date  . Lung removal, partial Left 10/17/09    pt states they removed small piece of lung due to cancer  . 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 Insertion central venous access device w/ subcutaneous port Right 05/2013    "PowerPort"  . Video bronchoscopy Bilateral 07/23/2013    Procedure: VIDEO BRONCHOSCOPY WITHOUT FLUORO;  Surgeon: Doree Fudge, MD;  Location: [facility];  Service: Cardiopulmonary;  Laterality: Bilateral;   Family  History  Problem Relation Age of Onset  . Hypertension Father   . Cancer Sister     breast   History  Substance Use Topics  . Smoking status: Former Smoker -- 0.50 packs/day for 12 years    Types: Cigarettes    Quit date: 04/29/1982  . Smokeless tobacco: Never Used     Comment: quit smoking cigarettes in the 1970's  . Alcohol Use: No   OB History   Grav Para Term Preterm Abortions TAB SAB Ect Mult Living   3 2        2      Review of Systems  All other systems reviewed and are negative.     Allergies  Codeine; Motrin; Other; Trazodone and nefazodone; Banana; Tape; and Xgeva  Home Medications   Prior to Admission medications   Medication Sig Start Date End Date Taking? Authorizing Provider  albuterol (PROAIR HFA) 108 (90 BASE) MCG/ACT inhaler Inhale 2 puffs into the lungs every 6 (six) hours as needed for wheezing or shortness of breath. 08/11/13   Collene Gobble, MD  Ascorbic Acid (VITAMIN C) 1000 MG tablet Take 1,000 mg by mouth daily.    Historical Provider, MD  calcium carbonate (OS-CAL) 600 MG TABS Take 600 mg by mouth 2 (two) times daily with a meal.    Historical Provider, MD  Cholecalciferol (VITAMIN D) 2000 UNITS tablet Take 2,000 Units by mouth daily.    Historical  Provider, MD  colchicine 0.6 MG tablet Take 1 tablet (0.6 mg total) by mouth daily. 10/04/13   [name], MD  dronabinol (MARINOL) 2.5 MG capsule Take 1 capsule (2.5 mg total) by mouth 2 (two) times daily before a meal. 09/21/13   Curt Bears, MD  esomeprazole (NEXIUM) 40 MG capsule Take 40 mg by mouth daily at 12 noon.    Historical Provider, MD  ferrous sulfate 325 (65 FE) MG tablet Take 1 tablet (325 mg total) by mouth 2 (two) times daily with a meal. 07/26/13   [name], MD  HYDROmorphone (DILAUDID) 2 MG tablet Take 0.5 tablets (1 mg total) by mouth every 6 (six) hours as needed for severe pain. 10/04/13   [name], MD  metoCLOPramide (REGLAN) 5 MG/5ML solution Take 5 mLs (5 mg total) by  mouth 2 (two) times daily before a meal. 10/04/13   [name], MD  metoprolol tartrate (LOPRESSOR) 25 MG tablet Take 0.5 tablets (12.5 mg total) by mouth 2 (two) times daily. 10/04/13   [name], MD  Multiple Vitamin (MULITIVITAMIN WITH MINERALS) TABS Take 1 tablet by mouth daily.     Historical Provider, MD  NIVOLUMAB IV nivolumab (BMS [id number]) 172 mg in sodium chloride 0.9 % 100 mL chemo infusion 3 mg/kg  57.2 kg (Order-Specific) Once 09/21/2013   Route: Intravenous    Historical Provider, MD  potassium chloride (K-DUR,KLOR-CON) 10 MEQ tablet Take 1 tablet (10 mEq total) by mouth 2 (two) times daily. 07/26/13   [name], MD  temazepam (RESTORIL) 15 MG capsule Take 15 mg by mouth at bedtime as needed for sleep.    Historical Provider, MD  vitamin B-12 (CYANOCOBALAMIN) 1000 MCG tablet Take 1,000 mcg by mouth daily.    Historical Provider, MD  vitamin E 400 UNIT capsule Take 400 Units by mouth daily.    Historical Provider, MD  Zoledronic Acid (ZOMETA) 4 MG/100ML IVPB Inject 4 mg into the vein every 28 (twenty-eight) days.    Historical Provider, MD   BP 100/61  Pulse 124  Resp 20  SpO2 98% Physical Exam  Nursing note and vitals reviewed. Constitutional: She is oriented to person, place, and time. She appears well-developed and well-nourished.  HENT:  Head: Normocephalic and atraumatic.  Eyes: Conjunctivae and EOM are normal. Pupils are equal, round, and reactive to light.  Neck: Normal range of motion. Neck supple.  Cardiovascular: Normal rate and regular rhythm.  Exam reveals no gallop and no friction rub.   No murmur heard. Pulmonary/Chest: Effort normal and breath sounds normal. No respiratory distress. She has no wheezes. She has no rales. She exhibits no tenderness.  Abdominal: Soft. Bowel sounds are normal. She exhibits no distension and no mass. There is no tenderness. There is no rebound and no guarding.  Musculoskeletal: Normal range of motion. She exhibits no edema  and no tenderness.  Neurological: She is alert and oriented to person, place, and time.  Skin: Skin is warm and dry.  Psychiatric: She has a normal mood and affect. Her behavior is normal. Judgment and thought content normal.    ED Course  Procedures (including critical care time) Results for orders placed during the hospital encounter of 70/96/28  BASIC METABOLIC PANEL      Result Value Ref Range   Sodium 134 (*) 137 - 147 mEq/L   Potassium 4.1  3.7 - 5.3 mEq/L   Chloride 94 (*) 96 - 112 mEq/L   CO2 20  19 - 32 mEq/L   Glucose, Bld 120 (*) 70 - 99 mg/dL   BUN 15  6 - 23 mg/dL   Creatinine, Ser 0.77  0.50 - 1.10 mg/dL   Calcium 8.9  8.4 - 10.5 mg/dL   GFR calc non Af Amer 86 (*) >90 mL/min   GFR calc Af Amer >90  >90 mL/min  CBC      Result Value Ref Range   WBC 7.5  4.0 - 10.5 K/uL   RBC 3.74 (*) 3.87 - 5.11 MIL/uL   Hemoglobin 9.7 (*) 12.0 - 15.0 g/dL   HCT 29.7 (*) 36.0 - 46.0 %   MCV 79.4  78.0 - 100.0 fL   MCH 25.9 (*) 26.0 - 34.0 pg   MCHC 32.7  30.0 - 36.0 g/dL   RDW 16.2 (*) 11.5 - 15.5 %   Platelets 362  150 - 400 K/uL  I-STAT TROPOININ, ED      Result Value Ref Range   Troponin i, poc 0.00  0.00 - 0.08 ng/mL   Comment 3            Dg Chest 2 View  10/15/2013   CLINICAL DATA:  Chest pain with numbness down left arm and left leg; history of COPD and lung surgery in 2011 for malignancy.  EXAM: CHEST  2 VIEW  COMPARISON:  PA and lateral chest of July 21, 2013 and chest CT scan of September 30, 2013.  FINDINGS: The lungs remain hyperinflated. Chronic changes in the left perihilar region posteriorly are present. There is stable blunting of the left lateral posterior costophrenic angles. There is no pneumothorax. The bony thorax is unremarkable. There is sclerosis within the left humeral head just inferior to the greater tuberosity which may be new. Power port appliance is unchanged in position. The heart is normal in size. The pulmonary vascularity is not engorged.  IMPRESSION:  There are chronic changes of COPD and previous surgery for malignancy in the left lung. There is no acute cardiopulmonary abnormality. There is a focus of sclerosis in the left humeral head that has not been clearly included in the the field-of-view on previous studies. Correlation with any symptoms here is needed. Nuclear bone scanning may be helpful in excluding metastatic disease.   Electronically Signed   By: Tao Satz  [place]   On: 10/15/2013 16:30   Ct Angio Chest Pe W/cm &/or Wo Cm  09/30/2013   CLINICAL DATA:  Chest pain, evaluate for PE. Lung cancer diagnosed 2011, status post resection. Shortness of breath, nausea/vomiting, chemotherapy ongoing.  EXAM: CT ANGIOGRAPHY CHEST  CT ABDOMEN AND PELVIS WITH CONTRAST  TECHNIQUE: Multidetector CT imaging of the chest was performed using the standard protocol during bolus administration of intravenous contrast. Multiplanar CT image reconstructions and MIPs were obtained to evaluate the vascular anatomy. Multidetector CT imaging of the abdomen and pelvis was performed using the standard protocol during bolus administration of intravenous contrast.  CONTRAST:  174mL OMNIPAQUE IOHEXOL 350 MG/ML SOLN  COMPARISON:  CT chest abdomen pelvis dated 08/03/2013  RECIST 1.1  Target Lesions:  1. Medial left upper lobe lung nodule - measures 17 mm (series 9/image 48), previously 16 mm 2. Prevascular lymph node - measures 16 mm (series 4/ image 29), previously 20 mm 3. Mediastinal nodal mass - poorly visualized, measures approximately 28 mm (series 4/ image 29), previously 45 mm 4. Right adrenal nodule - measures 36 mm (series 5/image 19), previously 37 mm Non-target Lesions:  1. Pericardial effusion - present (  series 4/image 68), unchanged 2. Sclerotic T11 lesion - present (series 4/ image 76), minimally increased 3. Destructive left iliac bone lesion - present (series 5/image 52), grossly unchanged 4. Sclerotic left sacral lesion - present (series 5/image 50), unchanged  FINDINGS:  CTA CHEST FINDINGS  No evidence of pulmonary embolism.  Status post left lower lobectomy. Radiation changes in the posterior left upper lobe/perihilar region.  1.7 x 1.4 cm nodular opacity medially in the anterior left upper lobe (series 9/image 48), previously 1.6 x 1.3 cm, suspicious for metastasis.  8 x 6 mm nodule in the medial right lower lobe (series 9/image 55), previously 4 mm, suspicious for metastases. Additional small 3-4 mm nodules in the medial right lower lobe (series 9/ images 51 and 54).  Minimal subpleural nodularity in the anterior right upper lobe, more conspicuous than on the prior study, although nonspecific. Moderate to severe centrilobular and paraseptal emphysematous changes.  Visualized thyroid is unremarkable.  Heart is normal in size. Small pericardial effusion, unchanged. Atherosclerotic calcifications of the aortic arch.  Right chest port terminating at the cavoatrial junction.  Thoracic lymphadenopathy, including:  --7 mm short axis left supraclavicular node (series 4/ image 10), previously 5 mm  --1.4 cm short axis right prevascular/retrosternal node (series 4/ image 509), previously 1.6 cm  --Right paratracheal nodal mass, poorly visualized but measuring approximately 2.8 x 2.1 cm (series 4/ image 509), previously 4.9 x 4.5 cm  --1.5 cm short axis left prevascular node (series 4/ image 32), previously 6 mm  Widespread osseous metastases in the visualized axial and appendicular skeleton, progressed. For example, age [age] x 2.2 cm lesion in the left T10 vertebral body (series 4/image 69) previously measured 1.4 x 1.5 cm.  CT ABDOMEN and PELVIS FINDINGS  Liver, spleen, pancreas, and left adrenal gland are within normal limits.  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  Small bilateral renal cysts measuring up to 10 mm in the left lower pole (series 5/ image 33). No hydronephrosis.  No evidence of bowel obstruction. Normal appendix. Colonic  diverticulosis, without associated inflammatory changes.  Atherosclerotic calcifications of the abdominal aorta and branch vessels.  No abdominopelvic ascites.  Upper abdominal/ retroperitoneal lymphadenopathy, including  --2.1 cm short axis portacaval node (series 5/ image 19), unchanged  --2.4 cm short axis right para-aortic node (series 5/ image 506), previously 2.0 cm  10 x 14 mm soft tissue implant along the posterior aspect of the left psoas muscle (series 5/image 49), previously 9 x 10 mm. Additional intramuscular metastases in the left pelvis (series 5/image 50, 51, and 44), mildly progressed.  Uterus is notable for probable uterine fibroids. Bilateral ovaries are grossly unremarkable.  Bladder is within normal limits.  Widespread sclerotic osseous metastases throughout the visualized axial and appendicular skeleton, including a permeative/destructive lesion involving the left iliac bone. Mild progression is suspected, including a 1.7 x 2.4 cm anterior right sacral lesion (series 5/ image 52), previously 1.5 x 1.9 cm.  Review of the MIP images confirms the above findings.  IMPRESSION: No evidence of pulmonary embolism.  Status post left lower lobectomy with radiation changes in the posterior left upper lung/perihilar region.  Progression of bilateral pulmonary and widespread osseous metastases, as above.  Mixed response of thoracoabdominal nodal metastases, with decrease in size of the dominant right paratracheal nodal mass, but mild progression elsewhere.  Soft tissue/intramuscular metastases in the left pelvis, mildly progressed.  RECIST 1.1 measurements as above.  These results were called by telephone  at the time of interpretation on 09/30/2013 at 9:56 AM to Dr. Shirlyn Goltz , who verbally acknowledged these results.   Electronically Signed   By: Julian Hy M.D.   On: 09/30/2013 10:44   Ct Abdomen Pelvis W Contrast  09/30/2013   CLINICAL DATA:  Chest pain, evaluate for PE. Lung cancer diagnosed 2011,  status post resection. Shortness of breath, nausea/vomiting, chemotherapy ongoing.  EXAM: CT ANGIOGRAPHY CHEST  CT ABDOMEN AND PELVIS WITH CONTRAST  TECHNIQUE: Multidetector CT imaging of the chest was performed using the standard protocol during bolus administration of intravenous contrast. Multiplanar CT image reconstructions and MIPs were obtained to evaluate the vascular anatomy. Multidetector CT imaging of the abdomen and pelvis was performed using the standard protocol during bolus administration of intravenous contrast.  CONTRAST:  140mL OMNIPAQUE IOHEXOL 350 MG/ML SOLN  COMPARISON:  CT chest abdomen pelvis dated 08/03/2013  RECIST 1.1  Target Lesions:  1. Medial left upper lobe lung nodule - measures 17 mm (series 9/image 48), previously 16 mm 2. Prevascular lymph node - measures 16 mm (series 4/ image 819), previously 20 mm 3. Mediastinal nodal mass - poorly visualized, measures approximately 28 mm (series 4/ image 819), previously 45 mm 4. Right adrenal nodule - measures 36 mm (series 5/image 19), previously 37 mm Non-target Lesions:  1. Pericardial effusion - present (series 4/image 68), unchanged 2. Sclerotic T11 lesion - present (series 4/ image 76), minimally increased 3. Destructive left iliac bone lesion - present (series 5/image 52), grossly unchanged 4. Sclerotic left sacral lesion - present (series 5/image 81), unchanged  FINDINGS: CTA CHEST FINDINGS  No evidence of pulmonary embolism.  Status post left lower lobectomy. Radiation changes in the posterior left upper lobe/perihilar region.  1.7 x 1.4 cm nodular opacity medially in the anterior left upper lobe (series 9/image 48), previously 1.6 x 1.3 cm, suspicious for metastasis.  8 x 6 mm nodule in the medial right lower lobe (series 9/image 55), previously 4 mm, suspicious for metastases. Additional small 3-4 mm nodules in the medial right lower lobe (series 9/ images 51 and 54).  Minimal subpleural nodularity in the anterior right upper lobe, more  conspicuous than on the prior study, although nonspecific. Moderate to severe centrilobular and paraseptal emphysematous changes.  Visualized thyroid is unremarkable.  Heart is normal in size. Small pericardial effusion, unchanged. Atherosclerotic calcifications of the aortic arch.  Right chest port terminating at the cavoatrial junction.  Thoracic lymphadenopathy, including:  --7 mm short axis left supraclavicular node (series 4/ image 10), previously 5 mm  --1.4 cm short axis right prevascular/retrosternal node (series 4/ image 819), previously 1.6 cm  --Right paratracheal nodal mass, poorly visualized but measuring approximately 2.8 x 2.1 cm (series 4/ image 819), previously 4.9 x 4.5 cm  --1.5 cm short axis left prevascular node (series 4/ image 32), previously 6 mm  Widespread osseous metastases in the visualized axial and appendicular skeleton, progressed. For example, age 81.7 x 2.2 cm lesion in the left T10 vertebral body (series 4/image 69) previously measured 1.4 x 1.5 cm.  CT ABDOMEN and PELVIS FINDINGS  Liver, spleen, pancreas, and left adrenal gland are within normal limits.  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  Small bilateral renal cysts measuring up to 10 mm in the left lower pole (series 5/ image 33). No hydronephrosis.  No evidence of bowel obstruction. Normal appendix. Colonic diverticulosis, without associated inflammatory changes.  Atherosclerotic calcifications of the abdominal aorta and branch  vessels.  No abdominopelvic ascites.  Upper abdominal/ retroperitoneal lymphadenopathy, including  --2.1 cm short axis portacaval node (series 5/ image 19), unchanged  --2.4 cm short axis right para-aortic node (series 5/ image 26), previously 2.0 cm  10 x 14 mm soft tissue implant along the posterior aspect of the left psoas muscle (series 5/image 49), previously 9 x 10 mm. Additional intramuscular metastases in the left pelvis (series 5/image 50, 51, and 44),  mildly progressed.  Uterus is notable for probable uterine fibroids. Bilateral ovaries are grossly unremarkable.  Bladder is within normal limits.  Widespread sclerotic osseous metastases throughout the visualized axial and appendicular skeleton, including a permeative/destructive lesion involving the left iliac bone. Mild progression is suspected, including a 1.7 x 2.4 cm anterior right sacral lesion (series 5/ image 52), previously 1.5 x 1.9 cm.  Review of the MIP images confirms the above findings.  IMPRESSION: No evidence of pulmonary embolism.  Status post left lower lobectomy with radiation changes in the posterior left upper lung/perihilar region.  Progression of bilateral pulmonary and widespread osseous metastases, as above.  Mixed response of thoracoabdominal nodal metastases, with decrease in size of the dominant right paratracheal nodal mass, but mild progression elsewhere.  Soft tissue/intramuscular metastases in the left pelvis, mildly progressed.  RECIST 1.1 measurements as above.  These results were called by telephone at the time of interpretation on 09/30/2013 at 9:56 AM to Dr. Shirlyn Goltz , who verbally acknowledged these results.   Electronically Signed   By: Julian Hy M.D.   On: 09/30/2013 10:44   Dg Chest Portable 1 View  10/25/2013   CLINICAL DATA:  Chest pain.  EXAM: PORTABLE CHEST - 1 VIEW  COMPARISON:  10/15/2013.  FINDINGS: The right-sided power port is stable. The cardiac silhouette, mediastinal and hilar contours are within normal limits and unchanged. Stable radiation changes involving the paramediastinal lung. Stable surgical changes on the left side with loss of volume. No acute pulmonary findings or worrisome pulmonary lesions. The bony thorax is intact.  IMPRESSION: Stable surgical and radiation changes but no acute findings.   Electronically Signed   By: Kalman Jewels M.D.   On: 10/25/2013 15:28     Imaging Review No results found.   EKG Interpretation None       MDM   Final diagnoses:  Chest pain of pericarditis    Patient with chest pain, shortness of breath, and left arm pain. Will check labs, and will reassess.  Patient seen by and discussed with Dr. Doylene Canard, recommends giving some fluids and pain meds and will reassess.  6:28 PM Dr. Doylene Canard states that he will admit for obs.    Patient seen by and discussed with Dr. Tawnya Crook, who agrees with the plan.    Montine Circle, PA-C 10/25/13 1830

## 2013-10-25 NOTE — Telephone Encounter (Signed)
Patient sent to emergency department from upstairs lobby with symptoms of chest pain, shortness of breath, leg pain and nausea and vomiting.Informed Kristi and Faith, therapist on linac #1 no treat today, I will check on status in the morning.

## 2013-10-25 NOTE — ED Provider Notes (Signed)
Medical screening examination/treatment/procedure(s) were conducted as a shared visit with non-physician practitioner(s) and myself.  I personally evaluated the patient during the encounter. Pt CP free on exam, though does begin to experience nausea/dry heaves.  Once BP taken w/ appropriate cuff size, BP nml. Given hx concerning for ACS and continued symptoms, Dr. Doylene Canard will admit.    EKG Interpretation None       Date: 10/25/2013  Rate: 92  Rhythm: normal sinus rhythm  QRS Axis: normal  Intervals: normal  ST/T Wave abnormalities: normal  Conduction Disutrbances:none  Narrative Interpretation:   Old EKG Reviewed: unchanged    Neta Ehlers, MD 10/25/13 2359

## 2013-10-25 NOTE — H&P (Signed)
Referring Physician: Dixie Dials, MD  Kaitlin Reed is an 63 y.o. female.                       Chief Complaint: Chest pain, sharp HPI: 63 y.o. 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 She is here with sharp chest pain in left precordium. No history of CAD or cardiac stents. Echocardiogram 2 weeks ago showed mild to moderate pericardial effusion, free flowing without tamponade. Today's Chest x-ray appears stable. Blood work also shows no significant change.   Past Medical History  Diagnosis Date  . Heart murmur   . 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 Anemia   . History of blood transfusion 1970's    "related to tubal pregnancy"   . Radiation 07/29/13-08/13/13    right partracheal lymph nodes  . lung ca dx'd 06/2009    chemo comp 07/2010; xrt comp 06/2010      Past Surgical History  Procedure Laterality Date  . Lung removal, partial Left 10/17/09    pt states they removed small piece of lung due to cancer  . 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 Insertion central venous access device w/ subcutaneous port Right 05/2013    "PowerPort"  . Video bronchoscopy Bilateral 07/23/2013    Procedure: VIDEO BRONCHOSCOPY WITHOUT FLUORO;  Surgeon: Doree Fudge, MD;  Location: [facility];  Service: Cardiopulmonary;  Laterality: Bilateral;    Family History  Problem Relation Age of Onset  . Hypertension Father   . Cancer Sister     breast   Social History:  reports that she quit smoking about 31 years ago. Her smoking use included Cigarettes. She has a 6 pack-year smoking history. She has never used smokeless tobacco. She reports that she does not drink alcohol or use illicit drugs.  Allergies:  Allergies  Allergen Reactions  . Codeine Nausea And Vomiting  . Motrin [Ibuprofen] Nausea And Vomiting  . Other Nausea Only    steroids  . Trazodone And Nefazodone Nausea And Vomiting  . Banana Hives  . Tape Swelling    Paper  tape okay Cannot use elastic tape  . Xgeva [Denosumab] Rash     (Not in a hospital admission)  Results for orders placed during the hospital encounter of 10/25/13 (from the past 48 hour(s))  BASIC METABOLIC PANEL     Status: Abnormal   Collection Time    10/25/13  2:52 PM      Result Value Ref Range   Sodium 134 (*) 137 - 147 mEq/L   Potassium 4.1  3.7 - 5.3 mEq/L   Chloride 94 (*) 96 - 112 mEq/L   CO2 20  19 - 32 mEq/L   Glucose, Bld 120 (*) 70 - 99 mg/dL   BUN 15  6 - 23 mg/dL   Creatinine, Ser 0.77  0.50 - 1.10 mg/dL   Calcium 8.9  8.4 - 10.5 mg/dL   GFR calc non Af Amer 86 (*) >90 mL/min   GFR calc Af Amer >90  >90 mL/min   Comment: (NOTE)     The eGFR has been calculated using the CKD EPI equation.     This calculation has not been validated in all clinical situations.     eGFR's persistently <90 mL/min signify possible Chronic Kidney     Disease.  CBC     Status: Abnormal   Collection Time  10/25/13  2:52 PM      Result Value Ref Range   WBC 7.5  4.0 - 10.5 K/uL   RBC 3.74 (*) 3.87 - 5.11 MIL/uL   Hemoglobin 9.7 (*) 12.0 - 15.0 g/dL   HCT 29.7 (*) 36.0 - 46.0 %   MCV 79.4  78.0 - 100.0 fL   MCH 25.9 (*) 26.0 - 34.0 pg   MCHC 32.7  30.0 - 36.0 g/dL   RDW 16.2 (*) 11.5 - 15.5 %   Platelets 362  150 - 400 K/uL  I-STAT TROPOININ, ED     Status: None   Collection Time    10/25/13  3:11 PM      Result Value Ref Range   Troponin i, poc 0.00  0.00 - 0.08 ng/mL   Comment 3            Comment: Due to the release kinetics of cTnI,     a negative result within the first hours     of the onset of symptoms does not rule out     myocardial infarction with certainty.     If myocardial infarction is still suspected,     repeat the test at appropriate intervals.   Dg Chest Portable 1 View  10/25/2013   CLINICAL DATA:  Chest pain.  EXAM: PORTABLE CHEST - 1 VIEW  COMPARISON:  10/15/2013.  FINDINGS: The right-sided power port is stable. The cardiac silhouette, mediastinal  and hilar contours are within normal limits and unchanged. Stable radiation changes involving the paramediastinal lung. Stable surgical changes on the left side with loss of volume. No acute pulmonary findings or worrisome pulmonary lesions. The bony thorax is intact.  IMPRESSION: Stable surgical and radiation changes but no acute findings.   Electronically Signed   By: Kalman Jewels M.D.   On: 10/25/2013 15:28    Review Of Systems Constitutional: positive for fatigue and weight loss  Eyes: negative  Ears, nose, mouth, throat, and face: negative  Respiratory: positive for cough and hemoptysis  Cardiovascular: negative  Gastrointestinal: positive for epigastric discomfort  Genitourinary: negative  Integument/breast: negative  Hematologic/lymphatic: negative  Musculoskeletal:positive for arthralgias and myalgias  Neurological: negative  Behavioral/Psych: negative  Endocrine: negative  Allergic/Immunologic: negative    Blood pressure 95/55, pulse 95, resp. rate 28, SpO2 95.00%.  PHYSICAL EXAMINATION: General appearance: alert, cooperative but appears anxious.  Head: Normocephalic, atraumatic. [employer], PERL, EOMI. Multiple dark brown freckles over face. Conj-pale pink  Neck: no adenopathy, full range of motion.  Resp: clear to auscultation bilaterally. Right anterior chest Port-A-Cath, well-healed, no evidence of infection or inflammation, non-tender.  Cardio: Tachycardic, S1, S2 normal, no murmur or click. No pericardial rub today.  GI: soft, epigastric and left upper quadrant-tender; bowel sounds normal; no masses, no organomegaly.  Extremities: extremities normal, atraumatic, no cyanosis or edema  Neurologic: Alert and oriented X 3, mild generalized weakness. Normal symmetric reflexes. Normal coordination and gait  Skin-mild paleness.  Assessment/Plan Chest pain  Chronic Pericarditis with effusion  Abdominal pain and nausea Anemia of chronic disease  Non-small cell lung  cancer with metastasis  Anxiety  Medical/palliative treatment as much as possible.  Full code per patient request. IV fluid and analgesics.  KADAKIA,AJAY S 10/25/2013, 6:18 PM

## 2013-10-25 NOTE — Progress Notes (Signed)
Prior note indicated that Kaitlin Reed was released after her 10/15/13 ED visit for chest pain, however, she was actually admitted then released on 10/17/13.

## 2013-10-26 ENCOUNTER — Ambulatory Visit: Payer: Medicare Other | Admitting: Radiation Oncology

## 2013-10-26 ENCOUNTER — Ambulatory Visit: Payer: Medicare Other

## 2013-10-26 DIAGNOSIS — I309 Acute pericarditis, unspecified: Secondary | ICD-10-CM | POA: Diagnosis not present

## 2013-10-26 LAB — MAGNESIUM: Magnesium: 1.8 mg/dL (ref 1.5–2.5)

## 2013-10-26 LAB — CBC
HEMATOCRIT: 27.7 % — AB (ref 36.0–46.0)
Hemoglobin: 8.7 g/dL — ABNORMAL LOW (ref 12.0–15.0)
MCH: 26 pg (ref 26.0–34.0)
MCHC: 31.4 g/dL (ref 30.0–36.0)
MCV: 82.7 fL (ref 78.0–100.0)
PLATELETS: 294 10*3/uL (ref 150–400)
RBC: 3.35 MIL/uL — ABNORMAL LOW (ref 3.87–5.11)
RDW: 16.7 % — ABNORMAL HIGH (ref 11.5–15.5)
WBC: 6 10*3/uL (ref 4.0–10.5)

## 2013-10-26 LAB — CBC WITH DIFFERENTIAL/PLATELET
BASOS ABS: 0 10*3/uL (ref 0.0–0.1)
Basophils Relative: 0 % (ref 0–1)
Eosinophils Absolute: 0.1 10*3/uL (ref 0.0–0.7)
Eosinophils Relative: 1 % (ref 0–5)
HCT: 24.9 % — ABNORMAL LOW (ref 36.0–46.0)
HEMOGLOBIN: 7.9 g/dL — AB (ref 12.0–15.0)
LYMPHS PCT: 11 % — AB (ref 12–46)
Lymphs Abs: 0.6 10*3/uL — ABNORMAL LOW (ref 0.7–4.0)
MCH: 25.6 pg — ABNORMAL LOW (ref 26.0–34.0)
MCHC: 31.7 g/dL (ref 30.0–36.0)
MCV: 80.6 fL (ref 78.0–100.0)
MONO ABS: 0.9 10*3/uL (ref 0.1–1.0)
MONOS PCT: 15 % — AB (ref 3–12)
NEUTROS ABS: 4.3 10*3/uL (ref 1.7–7.7)
NEUTROS PCT: 73 % (ref 43–77)
Platelets: 276 10*3/uL (ref 150–400)
RBC: 3.09 MIL/uL — ABNORMAL LOW (ref 3.87–5.11)
RDW: 16.5 % — ABNORMAL HIGH (ref 11.5–15.5)
WBC: 5.8 10*3/uL (ref 4.0–10.5)

## 2013-10-26 LAB — BASIC METABOLIC PANEL
BUN: 18 mg/dL (ref 6–23)
CHLORIDE: 95 meq/L — AB (ref 96–112)
CO2: 19 meq/L (ref 19–32)
Calcium: 8.8 mg/dL (ref 8.4–10.5)
Creatinine, Ser: 0.78 mg/dL (ref 0.50–1.10)
GFR calc non Af Amer: 86 mL/min — ABNORMAL LOW (ref >90)
Glucose, Bld: 85 mg/dL (ref 70–99)
Potassium: 4.4 mEq/L (ref 3.7–5.3)
SODIUM: 134 meq/L — AB (ref 137–147)

## 2013-10-26 LAB — COMPREHENSIVE METABOLIC PANEL
ALT: 11 U/L (ref 0–35)
AST: 38 U/L — AB (ref 0–37)
Albumin: 2.9 g/dL — ABNORMAL LOW (ref 3.5–5.2)
Alkaline Phosphatase: 419 U/L — ABNORMAL HIGH (ref 39–117)
BUN: 12 mg/dL (ref 6–23)
CHLORIDE: 96 meq/L (ref 96–112)
CO2: 23 meq/L (ref 19–32)
CREATININE: 0.77 mg/dL (ref 0.50–1.10)
Calcium: 8.6 mg/dL (ref 8.4–10.5)
GFR calc Af Amer: >90 mL/min (ref >90)
GFR, EST NON AFRICAN AMERICAN: 86 mL/min — AB (ref >90)
Glucose, Bld: 108 mg/dL — ABNORMAL HIGH (ref 70–99)
POTASSIUM: 3.8 meq/L (ref 3.7–5.3)
Sodium: 134 mEq/L — ABNORMAL LOW (ref 137–147)
Total Protein: 6.3 g/dL (ref 6.0–8.3)

## 2013-10-26 LAB — TSH: TSH: 3.43 u[IU]/mL (ref 0.350–4.500)

## 2013-10-26 LAB — PHOSPHORUS: PHOSPHORUS: 3.7 mg/dL (ref 2.3–4.6)

## 2013-10-26 LAB — LACTATE DEHYDROGENASE: LDH: 528 U/L — ABNORMAL HIGH (ref 94–250)

## 2013-10-26 MED ORDER — HYDROMORPHONE HCL 2 MG PO TABS
1.0000 mg | ORAL_TABLET | Freq: Three times a day (TID) | ORAL | Status: DC | PRN
  Administered 2013-10-26: 1 mg via ORAL
  Filled 2013-10-26: qty 1

## 2013-10-26 MED ORDER — HYDROMORPHONE HCL 2 MG PO TABS
1.0000 mg | ORAL_TABLET | Freq: Once | ORAL | Status: AC
  Administered 2013-10-26: 1 mg via ORAL
  Filled 2013-10-26: qty 1

## 2013-10-26 MED ORDER — SODIUM CHLORIDE 0.9 % IV SOLN: INTRAVENOUS | Status: DC

## 2013-10-26 MED ORDER — SODIUM CHLORIDE 0.9 % IV SOLN: INTRAVENOUS | Status: AC

## 2013-10-26 NOTE — Progress Notes (Signed)
Ref: [name], MD   Subjective:  Feels tired and weak. Low Hgb of 8.7. Quick look Echocardiogram with slightly less pericardial effusion.  Objective:  Vital Signs in the last 24 hours: Temp:  [97.9 F (36.6 C)-98.3 F (36.8 C)] 98.3 F (36.8 C) (06/30 1334) Pulse Rate:  [94-103] 103 (06/30 1334) Cardiac Rhythm:  [-] Normal sinus rhythm (06/29 2038) Resp:  [13-32] 20 (06/30 1334) BP: (93-105)/(39-58) 105/45 mmHg (06/30 1334) SpO2:  [94 %-97 %] 97 % (06/30 1334) Weight:  [49.306 kg (108 lb 11.2 oz)-52.844 kg (116 lb 8 oz)] 49.306 kg (108 lb 11.2 oz) (06/30 0539)  Physical Exam: BP Readings from Last 1 Encounters:  10/26/13 105/45    Wt Readings from Last 1 Encounters:  10/26/13 49.306 kg (108 lb 11.2 oz)    Weight change:   HEENT: [place]/AT, Eyes-Brown, PERL, EOMI, Conjunctiva-Pale, Sclera-Non-icteric. Multiple dark brown freckles over face. Neck: No JVD, No bruit, Trachea midline. Lungs:  Clear, Bilateral. Right anterior chest Port-A-Cath Cardiac:  Regular rhythm, normal S1 and S2, no S3.  Abdomen:  Soft, non-tender. Extremities:  No edema present. No cyanosis. No clubbing. CNS: AxOx3, Cranial nerves grossly intact, moves all 4 extremities. Right handed. Generalized weakness. Skin: Warm and dry.   Intake/Output from previous day: 06/29 0701 - 06/30 0700 In: 555 [P.O.:150; I.V.:405] Out: 200 [Urine:200]    Lab Results: BMET    Component Value Date/Time   NA 134* 10/26/2013 0545   NA 134* 10/25/2013 1452   NA 136* 10/16/2013 0513   NA 137 10/05/2013 1443   NA 138 09/21/2013 1454   NA 138 09/07/2013 1511   NA 138 11/06/2011 1121   NA 146* 03/04/2011 0806   NA 146* 11/29/2010 0803   K 4.4 10/26/2013 0545   K 4.1 10/25/2013 1452   K 4.4 10/16/2013 0513   K 3.8 10/05/2013 1443   K 4.3 09/21/2013 1454   K 3.8 09/07/2013 1511   K 4.3 11/06/2011 1121   K 3.8 03/04/2011 0806   K 4.1 11/29/2010 0803   CL 95* 10/26/2013 0545   CL 94* 10/25/2013 1452   CL 97 10/16/2013 0513   CL 105  10/07/2012 1350   CL 104 08/24/2012 1547   CL 103 08/17/2012 1544   CL 98 11/06/2011 1121   CL 102 03/04/2011 0806   CL 106 11/29/2010 0803   CO2 19 10/26/2013 0545   CO2 20 10/25/2013 1452   CO2 21 10/16/2013 0513   CO2 25 10/05/2013 1443   CO2 20* 09/21/2013 1454   CO2 23 09/07/2013 1511   CO2 30 11/06/2011 1121   CO2 32 03/04/2011 0806   CO2 30 11/29/2010 0803   GLUCOSE 85 10/26/2013 0545   GLUCOSE 120* 10/25/2013 1452   GLUCOSE 81 10/16/2013 0513   GLUCOSE 122 10/05/2013 1443   GLUCOSE 111 09/21/2013 1454   GLUCOSE 111 09/07/2013 1511   GLUCOSE 123* 10/07/2012 1350   GLUCOSE 105* 08/24/2012 1547   GLUCOSE 105* 08/17/2012 1544   GLUCOSE 108 11/06/2011 1121   GLUCOSE 99 03/04/2011 0806   GLUCOSE 105 11/29/2010 0803   BUN 18 10/26/2013 0545   BUN 15 10/25/2013 1452   BUN 11 10/16/2013 0513   BUN 9.8 10/05/2013 1443   BUN 21.5 09/21/2013 1454   BUN 17.1 09/07/2013 1511   BUN 15 11/06/2011 1121   BUN 12 03/04/2011 0806   BUN 16 11/29/2010 0803   CREATININE 0.78 10/26/2013 0545   CREATININE 0.77 10/25/2013 1452  CREATININE 0.72 10/16/2013 0513   CREATININE 1.1 10/05/2013 1443   CREATININE 1.1 09/21/2013 1454   CREATININE 1.0 09/07/2013 1511   CREATININE 0.9 11/06/2011 1121   CREATININE 0.9 03/04/2011 0806   CREATININE 1.1 11/29/2010 0803   CALCIUM 8.8 10/26/2013 0545   CALCIUM 8.9 10/25/2013 1452   CALCIUM 9.0 10/16/2013 0513   CALCIUM 9.0 10/05/2013 1443   CALCIUM 10.0 09/21/2013 1454   CALCIUM 10.1 09/07/2013 1511   CALCIUM 9.0 11/06/2011 1121   CALCIUM 9.0 03/04/2011 0806   CALCIUM 9.3 11/29/2010 0803   GFRNONAA 86* 10/26/2013 0545   GFRNONAA 86* 10/25/2013 1452   GFRNONAA 88* 10/16/2013 0513   GFRAA >90 10/26/2013 0545   GFRAA >90 10/25/2013 1452   GFRAA >90 10/16/2013 0513   CBC    Component Value Date/Time   WBC 6.0 10/26/2013 0545   WBC 7.2 10/05/2013 1442   RBC 3.35* 10/26/2013 0545   RBC 3.68* 10/05/2013 1442   HGB 8.7* 10/26/2013 0545   HGB 9.7* 10/05/2013 1442   HCT 27.7* 10/26/2013 0545   HCT 30.1* 10/05/2013 1442    PLT 294 10/26/2013 0545   PLT 362 10/05/2013 1442   MCV 82.7 10/26/2013 0545   MCV 81.9 10/05/2013 1442   MCH 26.0 10/26/2013 0545   MCH 26.3 10/05/2013 1442   MCHC 31.4 10/26/2013 0545   MCHC 32.1 10/05/2013 1442   RDW 16.7* 10/26/2013 0545   RDW 17.0* 10/05/2013 1442   LYMPHSABS 1.1 10/15/2013 1552   LYMPHSABS 1.0 10/05/2013 1442   MONOABS 0.7 10/15/2013 1552   MONOABS 0.8 10/05/2013 1442   EOSABS 0.0 10/15/2013 1552   EOSABS 0.1 10/05/2013 1442   BASOSABS 0.0 10/15/2013 1552   BASOSABS 0.0 10/05/2013 1442   HEPATIC Function Panel  Recent Labs  09/30/13 0804 10/05/13 1443 10/15/13 1552  PROT 7.7 7.4 7.6   HEMOGLOBIN A1C No components found with this basename: HGA1C,  MPG   CARDIAC ENZYMES Lab Results  Component Value Date   CKTOTAL 164 04/17/2011   CKMB 2.3 04/17/2011   TROPONINI <0.30 01/21/2012   TROPONINI <0.30 01/21/2012   TROPONINI <0.30 01/20/2012   BNP  Recent Labs  09/30/13 0804  PROBNP 255.5*   TSH  Recent Labs  06/03/13 1513 07/20/13 1354 09/21/13 1454  TSH 2.529 4.730* 2.919   CHOLESTEROL No results found for this basename: CHOL,  in the last 8760 hours  Scheduled Meds: . docusate sodium  100 mg Oral BID  . sodium chloride  3 mL Intravenous Q12H   Continuous Infusions: . sodium chloride 100 mL/hr at 10/26/13 1408   PRN Meds:.acetaminophen, acetaminophen, alum & mag hydroxide-simeth, HYDROmorphone (DILAUDID) injection, ondansetron (ZOFRAN) IV, ondansetron, sodium chloride  Assessment/Plan: Chest pain  Chronic Pericarditis with effusion  Abdominal pain and nausea  Anemia of chronic disease  Non-small cell lung cancer with metastasis  Anxiety and depression  Continue IV fluids. Home soon.  Advised to use wheel chair until Hgb improves and has more strength. Patient understood her long-term prognosis is poor.    LOS: 1 day    Kaitlin Dials  MD  10/26/2013, 5:48 PM

## 2013-10-26 NOTE — Progress Notes (Signed)
UR Completed.  Vergie Living [phone] 10/26/2013

## 2013-10-26 NOTE — Progress Notes (Signed)
Pt arrived to unit chest pain free but reporting nausea and vomiting.  Pt reports nausea/vomiting following administration of Dilaudid. MD notified and a one time dose of Phenergan ordered and Dilaudid changed from every 3 hours to every 8 hours.    When asked about partial code status, pt stated "I want everything done."  MD made aware and will discuss with pt.

## 2013-10-27 ENCOUNTER — Telehealth: Payer: Self-pay

## 2013-10-27 ENCOUNTER — Ambulatory Visit: Payer: Medicare Other

## 2013-10-27 DIAGNOSIS — C3430 Malignant neoplasm of lower lobe, unspecified bronchus or lung: Secondary | ICD-10-CM

## 2013-10-27 DIAGNOSIS — C7952 Secondary malignant neoplasm of bone marrow: Secondary | ICD-10-CM

## 2013-10-27 DIAGNOSIS — C7951 Secondary malignant neoplasm of bone: Secondary | ICD-10-CM

## 2013-10-27 DIAGNOSIS — I309 Acute pericarditis, unspecified: Secondary | ICD-10-CM | POA: Diagnosis not present

## 2013-10-27 DIAGNOSIS — I319 Disease of pericardium, unspecified: Secondary | ICD-10-CM

## 2013-10-27 MED ORDER — HEPARIN SOD (PORK) LOCK FLUSH 100 UNIT/ML IV SOLN
500.0000 [IU] | INTRAVENOUS | Status: AC | PRN
  Administered 2013-10-27: 500 [IU]

## 2013-10-27 MED ORDER — POTASSIUM CHLORIDE CRYS ER 10 MEQ PO TBCR: 10.0000 meq | EXTENDED_RELEASE_TABLET | Freq: Every day | ORAL | Status: AC

## 2013-10-27 NOTE — Progress Notes (Signed)
DIAGNOSIS: Recurrent non-small cell lung cancer initially diagnosed as stage IIA (T1 N1 MX) adenocarcinoma in April 2011.   PRIOR THERAPY:  1. Status post 3 cycles of neoadjuvant chemotherapy with carboplatin and Alimta. Last dose was given July 23, 2009. 2. Status post left lower lobectomy with lymph node dissection under the care of Dr. Arlyce Dice on October 17, 2009. 3. Status post concurrent chemoradiation with weekly carboplatin and paclitaxel for disease recurrence. Last dose was given May 07, 2010. 4. Status post 3 cycles of consolidation chemotherapy with carboplatin and Alimta. Last dose was given August 13, 2010. 5. Status post palliative radiotherapy to the right neck area. 6. Systemic chemotherapy with single agent Taxotere 75 mg/m2 every 3 weeks,, status post 6 cycles, last dose was given on 09/03/2011. 7. Systemic chemotherapy with carboplatin for an AUC of 5 and Alimta at 500 mg per meter square given every 3 weeks, status post 6 cycles. 8. Maintenance chemotherapy with single agent Alimta 500 mg/M2 every 3 weeks, status post 3 cycles discontinued today secondary to disease progression.  9. Systemic chemotherapy with single agent gemcitabine 1000 mg/M2 on days 1 and 8 every 3 weeks, status post 3 cycles discontinued today secondary to disease progression. First cycle on 03/15/2013.  CURRENT THERAPY:  1) Immunotherapy according to the BMS clinical trial with Nivolumab. Status post 7 cycles  2) Zometa 4 mg IV every month. First dose given 05/17/2013   CHEMOTHERAPY INTENT: Palliative  CURRENT # OF CHEMOTHERAPY CYCLES: 7  CURRENT ANTIEMETICS: Zofran, dexamethasone and Compazine  CURRENT SMOKING STATUS: Former smoker  ORAL CHEMOTHERAPY AND CONSENT: None  CURRENT BISPHOSPHONATES USE: None  PAIN MANAGEMENT: None  NARCOTICS INDUCED CONSTIPATION: None  LIVING WILL AND CODE STATUS: Full code initially but no longer term resuscitation.  Subjective: The patient is seen and examined today.  She is feeling much better with less chest pain. She is expected to be discharged from the hospital later today. She denied having any significant shortness of breath. She continues to have fatigue and weakness.  Objective: Vital signs in last 24 hours: Temp:  [98.3 F (36.8 C)-98.8 F (37.1 C)] 98.4 F (36.9 C) (07/01 0604) Pulse Rate:  [98-103] 99 (07/01 0604) Resp:  [18-20] 18 (07/01 0604) BP: (96-111)/(45-96) 105/57 mmHg (07/01 0742) SpO2:  [94 %-97 %] 95 % (07/01 0604) Weight:  [110 lb 3.2 oz (49.986 kg)] 110 lb 3.2 oz (49.986 kg) (07/01 0604)  Intake/Output from previous day: 06/30 0701 - 07/01 0700 In: 760 [P.O.:240; I.V.:520] Out: -  Intake/Output this shift:    General appearance: alert, cooperative, fatigued and no distress Resp: clear to auscultation bilaterally Cardio: regular rate and rhythm, S1, S2 normal, no murmur, click, rub or gallop GI: soft, non-tender; bowel sounds normal; no masses,  no organomegaly Extremities: extremities normal, atraumatic, no cyanosis or edema  Lab Results:   Recent Labs  10/26/13 0545 10/26/13 2115  WBC 6.0 5.8  HGB 8.7* 7.9*  HCT 27.7* 24.9*  PLT 294 276   BMET  Recent Labs  10/26/13 0545 10/26/13 2115  NA 134* 134*  K 4.4 3.8  CL 95* 96  CO2 19 23  GLUCOSE 85 108*  BUN 18 12  CREATININE 0.78 0.77  CALCIUM 8.8 8.6    Studies/Results: Dg Chest Portable 1 View  10/25/2013   CLINICAL DATA:  Chest pain.  EXAM: PORTABLE CHEST - 1 VIEW  COMPARISON:  10/15/2013.  FINDINGS: The right-sided power port is stable. The cardiac silhouette, mediastinal and hilar contours are within  normal limits and unchanged. Stable radiation changes involving the paramediastinal lung. Stable surgical changes on the left side with loss of volume. No acute pulmonary findings or worrisome pulmonary lesions. The bony thorax is intact.  IMPRESSION: Stable surgical and radiation changes but no acute findings.   Electronically Signed   By: Kalman Jewels M.D.   On: 10/25/2013 15:28    Medications: I have reviewed the patient's current medications.  CODE STATUS: Full Code  Assessment/Plan: This is a very pleasant 63 years old American female with recurrent non-small cell lung cancer, adenocarcinoma status post several chemotherapy regimens and most recently treated with immunotherapy with Nivolumab but unfortunately has evidence for disease progression. I have a lengthy discussion with the patient today about her current condition and treatment options. I offered her palliative care and hospice referral versus proceeding with systemic chemotherapy with single agent Navelbine. The patient is still interested in proceeding further chemotherapy. I will arrange a followup appointment for her with me at the [facility] for more detailed discussion of this option. Chest pain has significantly improved secondary to diagnosis of pericarditis. Thank you for taking good care of Kaitlin Reed, I will continue to follow the patient with you and assist in her management an as-needed basis.   LOS: 2 days    [name] 10/27/2013

## 2013-10-27 NOTE — Discharge Summary (Signed)
Physician Discharge Summary  Patient ID: Kaitlin Reed MRN: [id number] DOB/AGE: 1950/07/20 63 y.o.  Admit date: 10/25/2013 Discharge date: 10/27/2013  Admission Diagnoses: Chest pain  Chronic Pericarditis with effusion  Abdominal pain and nausea  Anemia of chronic disease  Non-small cell lung cancer with metastasis  Anxiety  Discharge Diagnoses:  Principle Problem: * Chest pain of pericarditis *  Chronic Pericarditis with effusion  Abdominal pain and nausea  Early dehydration Anemia of chronic disease  Non-small cell lung cancer with metastasis  Anxiety   Discharged Condition: fair  Hospital Course: 63 y.o. 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 She is here with sharp chest pain in left precordium. No history of CAD or cardiac stents. Echocardiogram 2 weeks ago showed mild to moderate pericardial effusion, free flowing without tamponade. She also had nausea vomiting and poor oral intake. She improved with IV hydration and pain medications.  Consults: cardiology  Significant Diagnostic Studies: labs: Near normal BMEt and low Hgb of 9.7 to 7.9 post hydration. Low albumin of 2.9 and Alk PO4 of 419 with near normal Bilirubin, AST and ALT  Treatments: IV hydration and analgesia: Dilaudid  Discharge Exam: Blood pressure 105/57, pulse 99, temperature 98.4 F (36.9 C), temperature source Oral, resp. rate 18, height '5\' 6"'  (1.676 m), weight 49.986 kg (110 lb 3.2 oz), SpO2 95.00%. HEENT: [place]/AT, Eyes-Brown, PERL, EOMI, Conjunctiva-Pale, Sclera-Non-icteric. Multiple dark brown freckles over face.  Neck: No JVD, No bruit, Trachea midline.  Lungs: Clear, Bilateral. Right anterior chest Port-A-Cath  Cardiac: Regular rhythm, normal S1 and S2, no S3.  Abdomen: Soft, non-tender.  Extremities: No edema present. No cyanosis. No clubbing.  CNS: AxOx3, Cranial nerves grossly intact, moves all 4 extremities. Right handed. Generalized  weakness.  Skin: Warm and dry  Disposition: 01-Home or Self Care     Medication List         albuterol 108 (90 BASE) MCG/ACT inhaler  Commonly known as:  PROAIR HFA  Inhale 2 puffs into the lungs every 6 (six) hours as needed for wheezing or shortness of breath.     calcium carbonate 600 MG Tabs tablet  Commonly known as:  OS-CAL  Take 600 mg by mouth 2 (two) times daily with a meal.     colchicine 0.6 MG tablet  Take 1 tablet (0.6 mg total) by mouth daily.     dronabinol 2.5 MG capsule  Commonly known as:  MARINOL  Take 1 capsule (2.5 mg total) by mouth 2 (two) times daily before a meal.     esomeprazole 40 MG capsule  Commonly known as:  NEXIUM  Take 40 mg by mouth daily at 12 noon.     ferrous sulfate 325 (65 FE) MG tablet  Take 1 tablet (325 mg total) by mouth 2 (two) times daily with a meal.     HYDROmorphone 2 MG tablet  Commonly known as:  DILAUDID  Take 0.5 tablets (1 mg total) by mouth every 6 (six) hours as needed for severe pain.     metoCLOPramide 5 MG/5ML solution  Commonly known as:  REGLAN  Take 5 mLs (5 mg total) by mouth 2 (two) times daily before a meal.     metoprolol tartrate 25 MG tablet  Commonly known as:  LOPRESSOR  Take 0.5 tablets (12.5 mg total) by mouth 2 (two) times daily.     multivitamin with minerals Tabs tablet  Take 1 tablet by mouth daily.  NIVOLUMAB IV  - nivolumab (BMS Kaitlin Reed) 172 mg in sodium chloride 0.9 % 100 mL chemo infusion 3 mg/kg  57.2 kg (Order-Specific) Once 09/21/2013  -   Route: Intravenous     potassium chloride 10 MEQ tablet  Commonly known as:  K-DUR,KLOR-CON  Take 1 tablet (10 mEq total) by mouth daily.     temazepam 15 MG capsule  Commonly known as:  RESTORIL  Take 15 mg by mouth at bedtime as needed for sleep.     vitamin B-12 1000 MCG tablet  Commonly known as:  CYANOCOBALAMIN  Take 1,000 mcg by mouth daily.     vitamin C 1000 MG tablet  Take 1,000 mg by mouth daily.     Vitamin D 2000  UNITS tablet  Take 2,000 Units by mouth daily.     vitamin E 400 UNIT capsule  Take 400 Units by mouth daily.     ZOMETA 4 MG/100ML IVPB  Generic drug:  Zoledronic Acid  Inject 4 mg into the vein every 28 (twenty-eight) days.           Follow-up Information   Follow up with [facility] S, MD In 2 weeks.   Specialty:  Cardiology   Contact information:   [place] [address] [phone]       Follow up with Kaitlin Reed., MD. (As needed)    Specialty:  Oncology   Contact information:   [place] [address] [phone]       Signed: [name] 10/27/2013, 9:07 AM

## 2013-10-27 NOTE — Telephone Encounter (Addendum)
Left voice message for patient to call to update status on whether she would be continuing radiation as patient discharged from [facility] this morning.No show for 2:30 pm treatment.

## 2013-10-28 ENCOUNTER — Ambulatory Visit: Payer: Medicare Other

## 2013-10-28 ENCOUNTER — Telehealth: Payer: Self-pay | Admitting: Radiation Oncology

## 2013-10-28 ENCOUNTER — Other Ambulatory Visit: Payer: Self-pay | Admitting: Internal Medicine

## 2013-10-28 ENCOUNTER — Telehealth: Payer: Self-pay | Admitting: *Deleted

## 2013-10-28 DIAGNOSIS — C3491 Malignant neoplasm of unspecified part of right bronchus or lung: Secondary | ICD-10-CM

## 2013-10-28 NOTE — Telephone Encounter (Signed)
Pt's son called stating that pt is going to move to [place] with him to he can help take care of her.  They are requesting a 2nd opinion to be evaluated at [facility].  Will inform Dr Vista Mink.  Vicente Serene asked about whether pt needs to go to radiation appts scheduled next week.  Referred him to call Dr Unknown Jim RN to discuss further.  He verbalized understanding.  SLJ

## 2013-10-28 NOTE — Telephone Encounter (Addendum)
Therapist reports patient did not show for treatment yesterday despite being discharged from the hospital. Called patient's home and spoke with her son. After speaking with his mother he confirms she isn't coming in today for treatment because she "wants to rest." He verbalized his mother plans to call the department back later today to discuss when she plans to return for her treatment. Ensured she has our contact information. Notified Kristy, RT on L1 of this finding.

## 2013-11-01 ENCOUNTER — Telehealth: Payer: Self-pay | Admitting: Internal Medicine

## 2013-11-01 ENCOUNTER — Ambulatory Visit: Payer: Medicare Other

## 2013-11-01 ENCOUNTER — Telehealth: Payer: Self-pay | Admitting: Medical Oncology

## 2013-11-01 NOTE — Telephone Encounter (Signed)
referral message to HIM re referral to Dr. Maudie Mercury @ [facility]. no other orders per 7/2 pof.

## 2013-11-01 NOTE — Progress Notes (Signed)
  Radiation Oncology         [phone] ________________________________  Name: Kaitlin Reed MRN: [id number]  Date: 10/22/2013  DOB: 09-Dec-1950  End of Treatment Note  Diagnosis:   Metastatic lung cancer to left iliac      Indication for treatment:  Palliative  Radiation treatment dates:   10/14/2013-10/22/2013  Site/dose:   Left pelvis/ 18 Gy in 6 fractions at 3 Gy per fraction  Beams/energy:   AP/PA with 6 MV photons (anterior) and 15 MV photons posterior  Narrative: Kaitlin Reed initially tolerated Kaitlin Reed treatments well but was hospitalized due to cardiac issues and was unable to finish Kaitlin Reed planned course of therapy.  Kaitlin Reed ultimately decided to forgo treatment and moved to [place] to be closer to Kaitlin Reed family.   Plan: I spoke with Kaitlin Reed son at length over the phone. He had a good understanding of his mom's prognosis.  He discussed hospice versus a second opinion at [facility] and was going to make those arrangements. I expressed our special care for Kaitlin Reed and asked him to call me when Kaitlin Reed passed or if he needed any further assistance.   ------------------------------------------------  Thea Silversmith, MD

## 2013-11-01 NOTE — Telephone Encounter (Signed)
F/u call re referral to hospice and [facility]. I faxed records to [facility] and Palliative care [place] region. Message sent to med records re referral to [facility].

## 2013-11-02 ENCOUNTER — Ambulatory Visit: Payer: Medicare Other

## 2013-11-02 ENCOUNTER — Telehealth: Payer: Self-pay | Admitting: Medical Oncology

## 2013-11-02 ENCOUNTER — Telehealth: Payer: Self-pay | Admitting: Internal Medicine

## 2013-11-02 ENCOUNTER — Ambulatory Visit: Payer: Medicare Other | Admitting: Radiation Oncology

## 2013-11-02 NOTE — Telephone Encounter (Signed)
Demographic information given to hospice

## 2013-11-02 NOTE — Telephone Encounter (Signed)
Faxed pt medical records to Dr. David Stall. Dr. Julianne Rice nurse will review records and call pt with appt.

## 2013-11-03 ENCOUNTER — Ambulatory Visit: Payer: Medicare Other

## 2013-11-03 ENCOUNTER — Telehealth: Payer: Self-pay | Admitting: *Deleted

## 2013-11-03 NOTE — Telephone Encounter (Signed)
Pt's son Vicente Serene called wanting both a referral to [place] [facility] and a hospice referral in [place].  Per Dr Vista Mink, he advises that pt first see MD at [facility] to determine if there are any more options, then after that we can make the hospice referral.  Called and left a message on curtis vm with information.  SLJ

## 2013-11-03 NOTE — Telephone Encounter (Signed)
Called and informed Hospice in [place] [phone] that Dr Vista Mink will make referral if necessary once patient goes to [facility] for 2nd opinion.  SLJ

## 2013-11-04 ENCOUNTER — Ambulatory Visit: Payer: Medicare Other

## 2013-12-30 ENCOUNTER — Encounter: Payer: Self-pay | Admitting: *Deleted

## 2013-12-30 NOTE — Progress Notes (Signed)
12/30/2013  Follow up phone call for survival information Phone call placed to phone number currently listed as "home phone" with no answer. Message left on answering machine requesting a return phone call requesting a return phone call.   01/11/2014 Review of obituaries in [place] newspaper.. Death reported. Occurred February 01, 2014.

## 2014-01-27 DEATH — deceased

## 2014-02-28 ENCOUNTER — Encounter (HOSPITAL_COMMUNITY): Payer: Self-pay | Admitting: Emergency Medicine

## 2014-10-24 ENCOUNTER — Other Ambulatory Visit: Payer: Self-pay

## 2015-01-02 IMAGING — US IR US GUIDE VASC ACCESS RIGHT
1 series · 1 of 1 positions shown · non-contrast
Comparison: none

CLINICAL DATA: Lung carcinoma and need for porta cath to begin
chemotherapy.

[Series 1: ir us guide vasc access*right* · 1 of 1 slices shown]
[im 1/1]
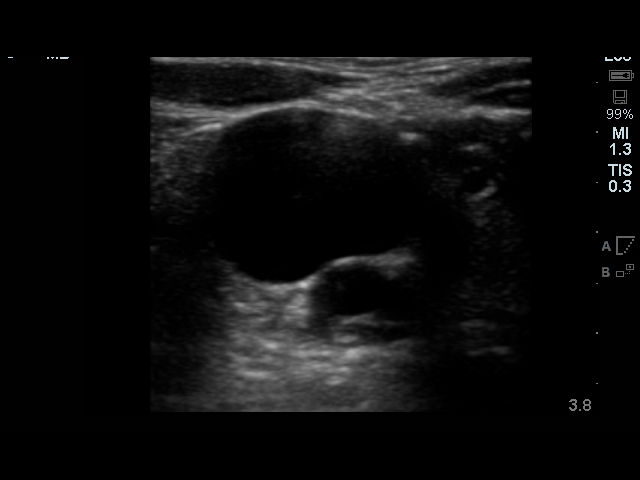

[1 of 1 positions shown; findings below may reference images not displayed]

EXAM:
IMPLANTED PORT A CATH PLACEMENT WITH ULTRASOUND AND FLUOROSCOPIC
GUIDANCE

ANESTHESIA/SEDATION:
2.0 Mg IV Versed; 100 mcg IV Fentanyl

Total Moderate Sedation Time:  35 minutes

Additional Medications: 2 g IV Ancef. As antibiotic prophylaxis,
Ancef was ordered pre-procedure and administered intravenously
within one hour of incision.

FLUOROSCOPY TIME:  30 seconds.

PROCEDURE:
The procedure, risks, benefits, and alternatives were explained to
the patient. Questions regarding the procedure were encouraged and
answered. The patient understands and consents to the procedure.

The right neck and chest were prepped with chlorhexidine in a
sterile fashion, and a sterile drape was applied covering the
operative field. Maximum barrier sterile technique with sterile
gowns and gloves were used for the procedure. Local anesthesia was
provided with 1% lidocaine.

Ultrasound was used to confirm patency of the right internal jugular
vein. After creating a small venotomy incision, a 21 gauge needle
was advanced into the right internal jugular vein under direct,
real-time ultrasound guidance. Ultrasound image documentation was
performed. After securing guidewire access, an 8 Fr dilator was
placed. A J-wire was kinked to measure appropriate catheter length.

A subcutaneous port pocket was then created along the upper chest
wall utilizing sharp and blunt dissection. Portable cautery was
utilized. The pocket was irrigated with sterile saline.

A single lumen power injectable port was chosen for placement. The 8
Fr catheter was tunneled from the port pocket site to the venotomy
incision. The port was placed in the pocket. External catheter was
trimmed to appropriate length based on guidewire measurement.

At the venotomy, an 8 Fr peel-away sheath was placed over a
guidewire. The catheter was then placed through the sheath and the
sheath removed. Final catheter positioning was confirmed and
documented with a fluoroscopic spot image. The port was accessed
with a needle and aspirated and flushed with heparinized saline. The
needle was removed.

The venotomy and port pocket incisions were closed with subcutaneous
3-0 Monocryl and subcuticular 4-0 Vicryl. Dermabond was applied to
both incisions.

Complications: None. No pneumothorax.
FINDINGS: After catheter placement, the tip lies at the cavoatrial junction.
The catheter aspirates normally and is ready for immediate use.
IMPRESSION: Placement of single lumen port a cath via right internal jugular
vein. The catheter tip lies at the cavoatrial junction. A power
injectable port a cath was placed and is ready for immediate use.

## 2015-05-25 IMAGING — CR DG CHEST 1V PORT
1 series · 1 of 1 positions shown · non-contrast
Comparison: 10/15/2013.

CLINICAL DATA: Chest pain.

EXAM:
PORTABLE CHEST - 1 VIEW

[AP]
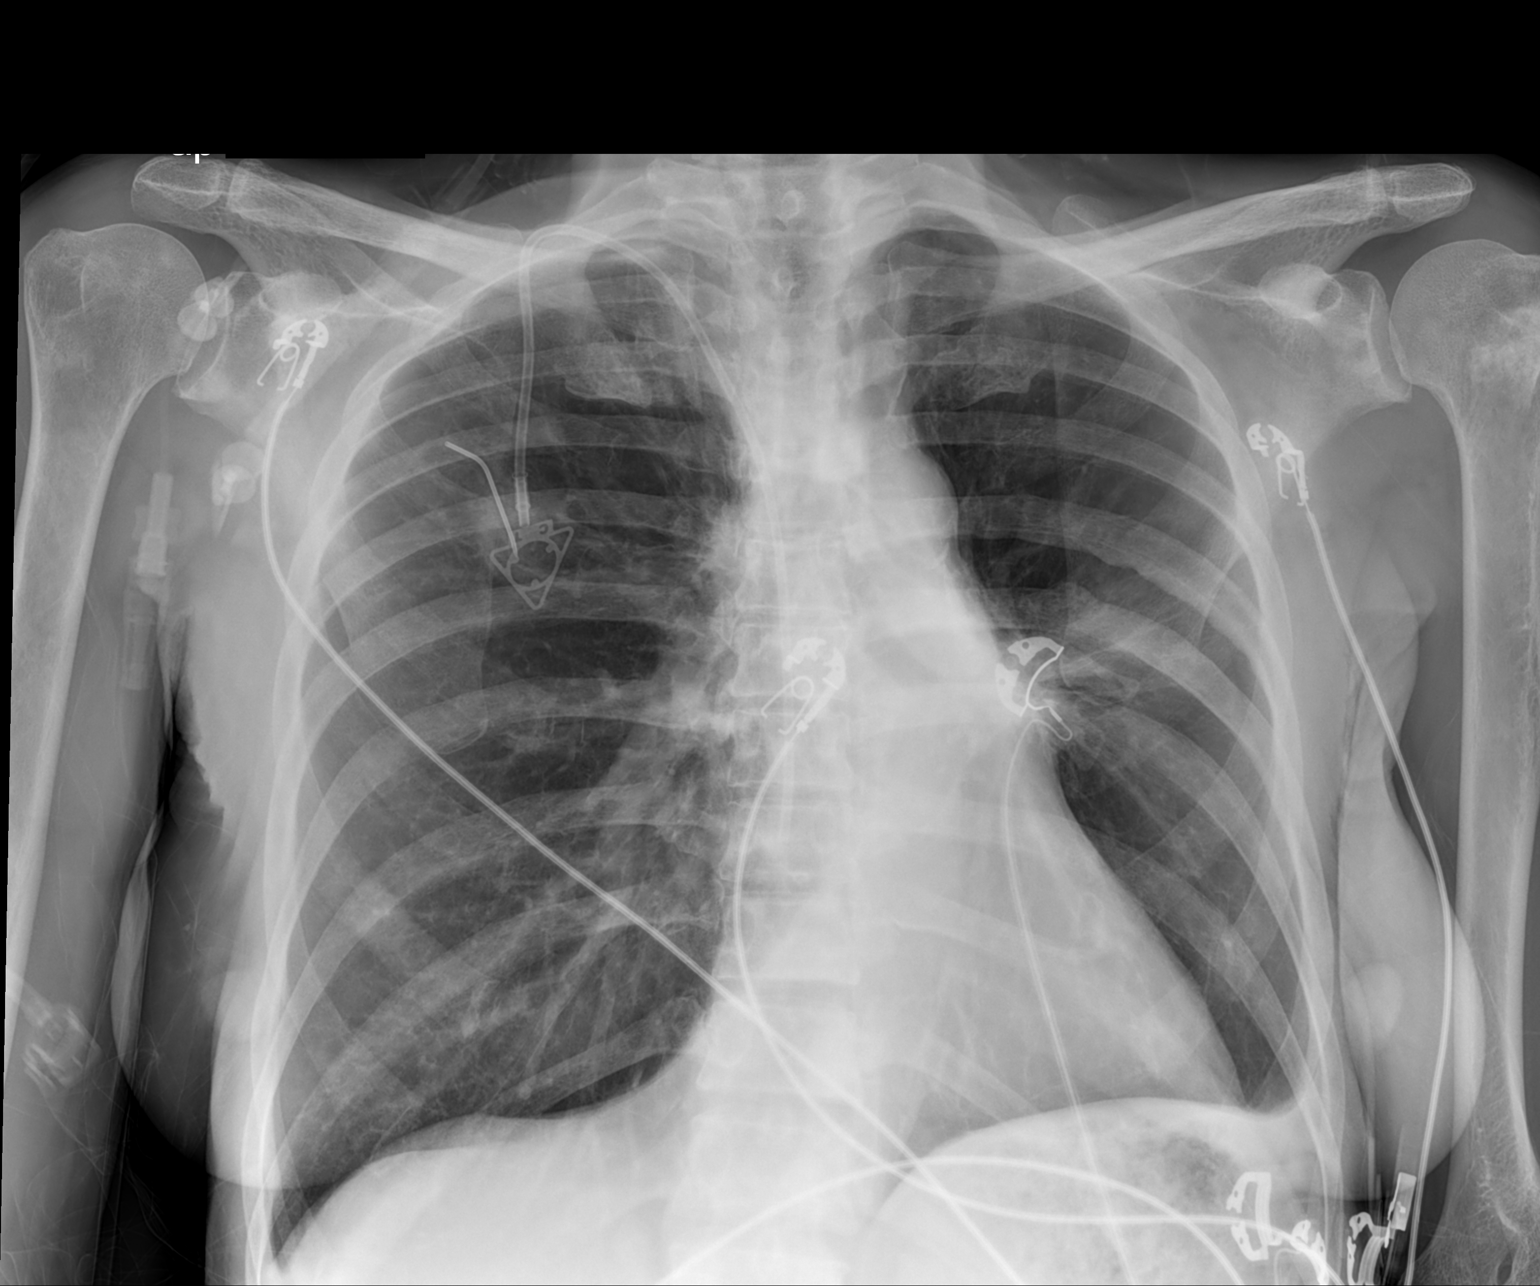

[1 of 1 positions shown; findings below may reference images not displayed]

FINDINGS: The right-sided power port is stable. The cardiac silhouette,
mediastinal and hilar contours are within normal limits and
unchanged. Stable radiation changes involving the paramediastinal
lung. Stable surgical changes on the left side with loss of volume.
No acute pulmonary findings or worrisome pulmonary lesions. The bony
thorax is intact.
IMPRESSION: Stable surgical and radiation changes but no acute findings.

## 2017-03-06 ENCOUNTER — Other Ambulatory Visit: Payer: Self-pay | Admitting: Nurse Practitioner

## 2021-02-23 ENCOUNTER — Other Ambulatory Visit: Payer: Self-pay | Admitting: Nurse Practitioner
